# Patient Record
Sex: Female | Born: 1955
Health system: Southern US, Community
[De-identification: ages and names within clinical notes are randomized; demographics above are authoritative.]

## PROBLEM LIST (undated history)

## (undated) DIAGNOSIS — G259 Extrapyramidal and movement disorder, unspecified: Secondary | ICD-10-CM

## (undated) DIAGNOSIS — E119 Type 2 diabetes mellitus without complications: Secondary | ICD-10-CM

## (undated) DIAGNOSIS — F329 Major depressive disorder, single episode, unspecified: Secondary | ICD-10-CM

## (undated) DIAGNOSIS — G629 Polyneuropathy, unspecified: Secondary | ICD-10-CM

## (undated) DIAGNOSIS — F988 Other specified behavioral and emotional disorders with onset usually occurring in childhood and adolescence: Secondary | ICD-10-CM

## (undated) DIAGNOSIS — F32A Depression, unspecified: Secondary | ICD-10-CM

## (undated) DIAGNOSIS — E785 Hyperlipidemia, unspecified: Secondary | ICD-10-CM

## (undated) DIAGNOSIS — G4730 Sleep apnea, unspecified: Secondary | ICD-10-CM

## (undated) DIAGNOSIS — K449 Diaphragmatic hernia without obstruction or gangrene: Secondary | ICD-10-CM

## (undated) DIAGNOSIS — F419 Anxiety disorder, unspecified: Secondary | ICD-10-CM

## (undated) DIAGNOSIS — K222 Esophageal obstruction: Secondary | ICD-10-CM

## (undated) HISTORY — DX: Major depressive disorder, single episode, unspecified: F32.9

## (undated) HISTORY — PX: SPINAL CORD STIMULATOR IMPLANT: SHX2422

## (undated) HISTORY — DX: Anxiety disorder, unspecified: F41.9

## (undated) HISTORY — DX: Extrapyramidal and movement disorder, unspecified: G25.9

## (undated) HISTORY — DX: Depression, unspecified: F32.A

## (undated) HISTORY — PX: CATARACT EXTRACTION, BILATERAL: SHX1313

## (undated) HISTORY — DX: Polyneuropathy, unspecified: G62.9

## (undated) HISTORY — DX: Diaphragmatic hernia without obstruction or gangrene: K44.9

## (undated) HISTORY — PX: CHOLECYSTECTOMY: SHX55

## (undated) HISTORY — DX: Other specified behavioral and emotional disorders with onset usually occurring in childhood and adolescence: F98.8

## (undated) HISTORY — PX: TOE AMPUTATION: SHX809

## (undated) HISTORY — PX: TUBAL LIGATION: SHX77

## (undated) HISTORY — DX: Type 2 diabetes mellitus without complications: E11.9

## (undated) HISTORY — DX: Esophageal obstruction: K22.2

## (undated) HISTORY — PX: ABDOMINAL HYSTERECTOMY: SHX81

## (undated) HISTORY — DX: Sleep apnea, unspecified: G47.30

## (undated) HISTORY — PX: RECTOCELE REPAIR: SHX761

---

## 1999-03-12 ENCOUNTER — Other Ambulatory Visit: Admission: RE | Admit: 1999-03-12 | Discharge: 1999-03-12 | Payer: Self-pay

## 2000-10-12 ENCOUNTER — Ambulatory Visit (HOSPITAL_COMMUNITY): Admission: RE | Admit: 2000-10-12 | Discharge: 2000-10-12 | Payer: Self-pay | Admitting: *Deleted

## 2000-10-12 ENCOUNTER — Encounter (INDEPENDENT_AMBULATORY_CARE_PROVIDER_SITE_OTHER): Payer: Self-pay | Admitting: *Deleted

## 2001-07-24 ENCOUNTER — Other Ambulatory Visit: Admission: RE | Admit: 2001-07-24 | Discharge: 2001-07-24 | Payer: Self-pay | Admitting: Family Medicine

## 2003-07-01 ENCOUNTER — Other Ambulatory Visit: Admission: RE | Admit: 2003-07-01 | Discharge: 2003-07-01 | Payer: Self-pay | Admitting: Family Medicine

## 2004-08-13 ENCOUNTER — Ambulatory Visit: Payer: Self-pay | Admitting: Psychology

## 2004-10-29 ENCOUNTER — Ambulatory Visit: Payer: Self-pay | Admitting: Psychology

## 2005-05-17 ENCOUNTER — Ambulatory Visit: Payer: Self-pay | Admitting: Psychology

## 2005-06-23 ENCOUNTER — Ambulatory Visit: Payer: Self-pay | Admitting: Psychology

## 2006-01-17 ENCOUNTER — Ambulatory Visit: Payer: Self-pay | Admitting: Psychology

## 2006-06-28 ENCOUNTER — Ambulatory Visit (HOSPITAL_COMMUNITY): Payer: Self-pay | Admitting: Psychology

## 2006-07-27 ENCOUNTER — Ambulatory Visit (HOSPITAL_COMMUNITY): Payer: Self-pay | Admitting: Psychology

## 2006-10-09 ENCOUNTER — Ambulatory Visit (HOSPITAL_COMMUNITY): Payer: Self-pay | Admitting: Psychology

## 2007-05-01 ENCOUNTER — Other Ambulatory Visit: Admission: RE | Admit: 2007-05-01 | Discharge: 2007-05-01 | Payer: Self-pay | Admitting: Family Medicine

## 2007-07-02 ENCOUNTER — Ambulatory Visit (HOSPITAL_COMMUNITY): Payer: Self-pay | Admitting: Psychology

## 2007-08-01 ENCOUNTER — Ambulatory Visit (HOSPITAL_COMMUNITY): Payer: Self-pay | Admitting: Psychology

## 2007-08-31 ENCOUNTER — Ambulatory Visit (HOSPITAL_COMMUNITY): Payer: Self-pay | Admitting: Psychology

## 2008-09-05 ENCOUNTER — Ambulatory Visit (HOSPITAL_COMMUNITY): Payer: Self-pay | Admitting: Psychology

## 2008-10-24 ENCOUNTER — Ambulatory Visit (HOSPITAL_COMMUNITY): Payer: Self-pay | Admitting: Psychology

## 2009-04-16 ENCOUNTER — Ambulatory Visit (HOSPITAL_COMMUNITY): Payer: Self-pay | Admitting: Psychology

## 2009-07-08 ENCOUNTER — Ambulatory Visit (HOSPITAL_COMMUNITY): Payer: Self-pay | Admitting: Psychology

## 2009-08-06 ENCOUNTER — Ambulatory Visit (HOSPITAL_COMMUNITY): Payer: Self-pay | Admitting: Psychology

## 2010-12-08 ENCOUNTER — Ambulatory Visit (HOSPITAL_COMMUNITY)
Admission: RE | Admit: 2010-12-08 | Discharge: 2010-12-08 | Payer: Self-pay | Source: Home / Self Care | Attending: Psychology | Admitting: Psychology

## 2011-09-10 ENCOUNTER — Other Ambulatory Visit: Payer: Self-pay | Admitting: Medical

## 2011-10-12 ENCOUNTER — Other Ambulatory Visit: Payer: Self-pay | Admitting: Medical

## 2011-11-04 ENCOUNTER — Other Ambulatory Visit: Payer: Self-pay | Admitting: Medical

## 2011-11-22 ENCOUNTER — Other Ambulatory Visit (HOSPITAL_COMMUNITY): Payer: Self-pay

## 2011-11-29 ENCOUNTER — Encounter (HOSPITAL_BASED_OUTPATIENT_CLINIC_OR_DEPARTMENT_OTHER): Payer: Self-pay

## 2011-12-13 HISTORY — PX: COLONOSCOPY: SHX174

## 2011-12-27 ENCOUNTER — Encounter (HOSPITAL_BASED_OUTPATIENT_CLINIC_OR_DEPARTMENT_OTHER): Payer: Self-pay

## 2012-03-21 ENCOUNTER — Ambulatory Visit: Payer: Self-pay | Admitting: Cardiology

## 2012-07-16 ENCOUNTER — Ambulatory Visit (HOSPITAL_COMMUNITY): Payer: Self-pay | Admitting: Psychology

## 2012-07-18 ENCOUNTER — Ambulatory Visit (INDEPENDENT_AMBULATORY_CARE_PROVIDER_SITE_OTHER): Payer: BC Managed Care – PPO | Admitting: Psychology

## 2012-07-18 DIAGNOSIS — F339 Major depressive disorder, recurrent, unspecified: Secondary | ICD-10-CM

## 2012-07-18 DIAGNOSIS — F411 Generalized anxiety disorder: Secondary | ICD-10-CM

## 2012-07-18 DIAGNOSIS — F419 Anxiety disorder, unspecified: Secondary | ICD-10-CM

## 2012-08-01 ENCOUNTER — Ambulatory Visit (HOSPITAL_COMMUNITY): Payer: Self-pay | Admitting: Psychology

## 2012-08-02 ENCOUNTER — Encounter (HOSPITAL_COMMUNITY): Payer: Self-pay | Admitting: Psychology

## 2012-08-02 NOTE — Progress Notes (Signed)
[name]   DOB: 09/06/1956  MR Number: [id number]  Location: BEHAVIORAL [facility] [address] [place] [address] Dept: [phone]  Start: 2 PM End: 3 PM  Provider/Observer:     Hershal Coria PSYD  Chief Complaint:      Chief Complaint  Patient presents with  . Depression  . Anxiety  . Stress  . Trauma    Reason For Service:     I seen the patient is also known to 4 several years. She was initially referred here for difficulty coping with the situation involving her children. Her youngest son is at severe difficulties over the years with regard to mood disorder, substance abuse and behavioral problems. Now there are major stressors associated with him again the patient has been essentially overwhelmed and unsure about what to do about. She reports that this has created a lot of stress both at home and at work. On top of that, her diabetes has gotten to the point that she had hammertoe amputated because of that. She has neuropathy as a result of diabetes as well and has had to stay out of her foot for 8 weeks.  Interventions Strategy:  Cognitive/behavioral psychotherapeutic interventions  Participation Level:   Active  Participation Quality:  Appropriate      Behavioral Observation:  Well Groomed, Alert, and Depressed.   Current Psychosocial Factors: The patient is essentially having to deal with her own medical issues from diabetes with resulting amputation of her toe and to return back to school after summer break. 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 The patient has had major problems with her son who has a long history of behavioral and substance abuse and bipolar disorder. He has started a relationship with a woman who has children and essentially is taking advantage of him but he is unable to deal  with her personality disorders and the lack of care that she gets to her children said he is taking care of the children.  Content of Session:   Review current symptoms and continued work on building improve coping skills  Current Status:   The patient is doing fairly poorly right now and is essentially overwhelmed bile the situation symptoms.  Patient Progress:   Stable  Target Goals:   Target goals includes reducing episodes of significant depression and anxiety doing better with her attentional problems, and helping the patient better manage her situation with her son.  Last Reviewed:   07/18/2002  Goals Addressed Today:    Goals addressed included building better coping skills.  Impression/Diagnosis:   The patient has a long history of attention deficit disorder but do to Maj. psychosocial stressors she also developed clinical depression and anxiety. I think that her attentional problems were valid prior to the development of these issues that existed her entire life. However, she is in and repeat if and recurrent trauma with regard primarily to her son and to her daughter to a lesser degree in years past.  Diagnosis:    Axis I:  1. Major depression, recurrent   2. Anxiety         Axis II: No diagnosis

## 2012-08-03 ENCOUNTER — Ambulatory Visit (INDEPENDENT_AMBULATORY_CARE_PROVIDER_SITE_OTHER): Payer: BC Managed Care – PPO | Admitting: Psychology

## 2012-08-03 ENCOUNTER — Encounter (HOSPITAL_COMMUNITY): Payer: Self-pay | Admitting: Psychology

## 2012-08-03 DIAGNOSIS — F339 Major depressive disorder, recurrent, unspecified: Secondary | ICD-10-CM

## 2012-08-03 DIAGNOSIS — F419 Anxiety disorder, unspecified: Secondary | ICD-10-CM

## 2012-08-03 DIAGNOSIS — F411 Generalized anxiety disorder: Secondary | ICD-10-CM

## 2012-08-03 NOTE — Progress Notes (Signed)
[name]   DOB: Feb 09, 1956  MR Number: [id number]  Location: BEHAVIORAL [facility] [address] [place] [address] Dept: [phone]  Start: 11 AM End: 12 PM  Provider/Observer:     Hershal Coria PSYD  Chief Complaint:      Chief Complaint  Patient presents with  . Agitation  . Stress  . Anxiety  . Depression    Reason For Service:     I seen the patient is also known to 4 several years. She was initially referred here for difficulty coping with the situation involving her children. Her youngest son is at severe difficulties over the years with regard to mood disorder, substance abuse and behavioral problems. Now there are major stressors associated with him again the patient has been essentially overwhelmed and unsure about what to do about. She reports that this has created a lot of stress both at home and at work. On top of that, her diabetes has gotten to the point that she had hammertoe amputated because of that. She has neuropathy as a result of diabetes as well and has had to stay out of her foot for 8 weeks.  Interventions Strategy:  Cognitive/behavioral psychotherapeutic interventions  Participation Level:   Active  Participation Quality:  Appropriate      Behavioral Observation:  Well Groomed, Alert, and Depressed.   Current Psychosocial Factors: The patient reports there continues to be a lot of stress regarding her son who is struggling with his relationship. Child protective services has gone to reviewed there living situation multiple times after the patient called and reported some of the problems are patent. She recalled a couple of instances that were very scary for her regarding her son. The patient's daughter is actually doing much better even though her husband is going away every weekend.  Content of Session:   Review current symptoms and continued work on building  improve coping skills  Current Status:   The patient is doing fairly poorly right now and is essentially overwhelmed bile the situation symptoms.  Patient Progress:   Stable  Target Goals:   Target goals includes reducing episodes of significant depression and anxiety doing better with her attentional problems, and helping the patient better manage her situation with her son.  Last Reviewed:   08/03/2002  Goals Addressed Today:    Goals addressed included building better coping skills.  Impression/Diagnosis:   The patient has a long history of attention deficit disorder but do to Maj. psychosocial stressors she also developed clinical depression and anxiety. I think that her attentional problems were valid prior to the development of these issues that existed her entire life. However, she is in and repeat if and recurrent trauma with regard primarily to her son and to her daughter to a lesser degree in years past.  Diagnosis:    Axis I:  1. Major depression, recurrent   2. Anxiety         Axis II: No diagnosis

## 2012-08-20 ENCOUNTER — Ambulatory Visit (INDEPENDENT_AMBULATORY_CARE_PROVIDER_SITE_OTHER): Payer: BC Managed Care – PPO | Admitting: Psychology

## 2012-08-20 DIAGNOSIS — F339 Major depressive disorder, recurrent, unspecified: Secondary | ICD-10-CM

## 2012-08-20 DIAGNOSIS — F411 Generalized anxiety disorder: Secondary | ICD-10-CM

## 2012-08-20 DIAGNOSIS — F419 Anxiety disorder, unspecified: Secondary | ICD-10-CM

## 2012-08-21 ENCOUNTER — Encounter (HOSPITAL_COMMUNITY): Payer: Self-pay | Admitting: Psychology
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
## 2012-11-19 ENCOUNTER — Ambulatory Visit (INDEPENDENT_AMBULATORY_CARE_PROVIDER_SITE_OTHER): Payer: BC Managed Care – PPO | Admitting: Psychology

## 2012-11-19 DIAGNOSIS — F411 Generalized anxiety disorder: Secondary | ICD-10-CM

## 2012-11-19 DIAGNOSIS — F419 Anxiety disorder, unspecified: Secondary | ICD-10-CM

## 2012-11-19 DIAGNOSIS — F339 Major depressive disorder, recurrent, unspecified: Secondary | ICD-10-CM

## 2012-11-21 ENCOUNTER — Encounter (HOSPITAL_COMMUNITY): Payer: Self-pay | Admitting: Psychology

## 2012-11-21 ENCOUNTER — Telehealth (HOSPITAL_COMMUNITY): Payer: Self-pay | Admitting: Psychology
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
## 2012-11-29 ENCOUNTER — Ambulatory Visit (INDEPENDENT_AMBULATORY_CARE_PROVIDER_SITE_OTHER): Payer: BC Managed Care – PPO | Admitting: Psychology

## 2012-11-29 DIAGNOSIS — F339 Major depressive disorder, recurrent, unspecified: Secondary | ICD-10-CM

## 2012-11-29 DIAGNOSIS — F411 Generalized anxiety disorder: Secondary | ICD-10-CM

## 2012-11-29 DIAGNOSIS — F419 Anxiety disorder, unspecified: Secondary | ICD-10-CM

## 2012-12-03 ENCOUNTER — Encounter (HOSPITAL_COMMUNITY): Payer: Self-pay | Admitting: Psychology
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
## 2012-12-25 ENCOUNTER — Ambulatory Visit (INDEPENDENT_AMBULATORY_CARE_PROVIDER_SITE_OTHER): Payer: BC Managed Care – PPO | Admitting: Psychology

## 2012-12-25 DIAGNOSIS — F419 Anxiety disorder, unspecified: Secondary | ICD-10-CM

## 2012-12-25 DIAGNOSIS — F411 Generalized anxiety disorder: Secondary | ICD-10-CM

## 2012-12-25 DIAGNOSIS — F339 Major depressive disorder, recurrent, unspecified: Secondary | ICD-10-CM

## 2012-12-27 ENCOUNTER — Encounter (HOSPITAL_COMMUNITY): Payer: Self-pay | Admitting: Psychology
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
## 2013-04-12 ENCOUNTER — Other Ambulatory Visit: Payer: Self-pay

## 2013-04-12 MED ORDER — SITAGLIPTIN PHOS-METFORMIN HCL 50-1000 MG PO TABS: 1.0000 | ORAL_TABLET | Freq: Two times a day (BID) | ORAL | Status: DC

## 2013-04-12 NOTE — Telephone Encounter (Signed)
Last blood sugar 12/13

## 2013-04-15 ENCOUNTER — Telehealth: Payer: Self-pay | Admitting: Nurse Practitioner

## 2013-04-15 MED ORDER — SITAGLIP PHOS-METFORMIN HCL ER 50-1000 MG PO TB24: 1.0000 | ORAL_TABLET | Freq: Every day | ORAL | Status: DC

## 2013-04-15 NOTE — Telephone Encounter (Signed)
Sent in RX for janumet XR

## 2013-04-15 NOTE — Telephone Encounter (Signed)
xr was the right one patient aware

## 2013-04-15 NOTE — Telephone Encounter (Signed)
Paper chart has a refill  Done by [facility] with XR noted . Please review and advise.

## 2013-05-07 ENCOUNTER — Ambulatory Visit (INDEPENDENT_AMBULATORY_CARE_PROVIDER_SITE_OTHER): Payer: BC Managed Care – PPO | Admitting: Psychology

## 2013-05-07 DIAGNOSIS — F419 Anxiety disorder, unspecified: Secondary | ICD-10-CM

## 2013-05-07 DIAGNOSIS — F411 Generalized anxiety disorder: Secondary | ICD-10-CM

## 2013-05-07 DIAGNOSIS — F339 Major depressive disorder, recurrent, unspecified: Secondary | ICD-10-CM

## 2013-05-14 ENCOUNTER — Telehealth: Payer: Self-pay | Admitting: Nurse Practitioner

## 2013-05-15 MED ORDER — SITAGLIP PHOS-METFORMIN HCL ER 50-1000 MG PO TB24: 1.0000 | ORAL_TABLET | Freq: Every day | ORAL | Status: DC

## 2013-05-15 NOTE — Telephone Encounter (Signed)
Done 05/15/13

## 2013-05-16 ENCOUNTER — Other Ambulatory Visit: Payer: Self-pay | Admitting: Family Medicine

## 2013-05-16 DIAGNOSIS — E119 Type 2 diabetes mellitus without complications: Secondary | ICD-10-CM

## 2013-05-16 MED ORDER — SITAGLIP PHOS-METFORMIN HCL ER 50-1000 MG PO TB24: 1.0000 | ORAL_TABLET | Freq: Two times a day (BID) | ORAL | Status: DC

## 2013-05-23 ENCOUNTER — Encounter (HOSPITAL_COMMUNITY): Payer: Self-pay | Admitting: Psychology

## 2013-05-23 NOTE — Progress Notes (Signed)
[name]   DOB: Nov 11, 1956  MR Number: [id number]  Location: BEHAVIORAL [facility]- [address] [place] [address] Dept: [phone]  Start: 4 PM End: 5 PM  Provider/Observer:     Hershal Coria PSYD  Chief Complaint:      Chief Complaint  Patient presents with  . Anxiety  . Stress  . Depression    Reason For Service:     I seen the patient is also known to 4 several years. She was initially referred here for difficulty coping with the situation involving her children. Her youngest son is at severe difficulties over the years with regard to mood disorder, substance abuse and behavioral problems. Now there are major stressors associated with him again the patient has been essentially overwhelmed and unsure about what to do about. She reports that this has created a lot of stress both at home and at work. On top of that, her diabetes has gotten to the point that she had hammertoe amputated because of that. She has neuropathy as a result of diabetes as well and has had to stay out of her foot for 8 weeks.  Interventions Strategy:  Cognitive/behavioral psychotherapeutic interventions  Participation Level:   Active  Participation Quality:  Appropriate      Behavioral Observation:  Well Groomed, Alert, and Depressed.   Current Psychosocial Factors: The patient is continuing to struggle mildly with her youngest son. He is in a very destructive and disruptive relationship although he has agreed for help. 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  Patient Progress:   Stable  Target  Goals:   Target goals includes reducing episodes of significant depression and anxiety doing better with her attentional problems, and helping the patient better manage her situation with her son.  Last Reviewed:   05/07/2013  Goals Addressed Today:    Goals addressed included building better coping skills.  Impression/Diagnosis:   The patient has a long history of attention deficit disorder but do to Maj. psychosocial stressors she also developed clinical depression and anxiety. I think that her attentional problems were valid prior to the development of these issues that existed her entire life. However, she is in and repeat if and recurrent trauma with regard primarily to her son and to her daughter to a lesser degree in years past.  Diagnosis:    Axis I:  Major depression, recurrent  Anxiety      Axis II: No diagnosis

## 2013-05-30 ENCOUNTER — Other Ambulatory Visit: Payer: Self-pay | Admitting: Family Medicine

## 2013-05-30 NOTE — Telephone Encounter (Signed)
Last seen 01/03/13  [facility]  If approved call in and have nurse notify patient

## 2013-05-31 MED ORDER — PREGABALIN 150 MG PO CAPS: 150.0000 mg | ORAL_CAPSULE | Freq: Two times a day (BID) | ORAL | Status: DC

## 2013-05-31 NOTE — Telephone Encounter (Signed)
Please call in lyrica rx with 2 refills

## 2013-05-31 NOTE — Telephone Encounter (Signed)
Done 05/30/13

## 2013-05-31 NOTE — Telephone Encounter (Signed)
Med called in

## 2013-06-17 ENCOUNTER — Other Ambulatory Visit: Payer: Self-pay

## 2013-06-17 MED ORDER — INSULIN GLARGINE 100 UNIT/ML SOLOSTAR PEN: 10.0000 [IU] | PEN_INJECTOR | Freq: Every day | SUBCUTANEOUS | Status: DC

## 2013-06-17 NOTE — Telephone Encounter (Signed)
This patient needs an appointment to be seen by a provider. The medication can be refill one time more time. She needs to be seen  by a provider. Please schedule her to be seen.

## 2013-06-17 NOTE — Telephone Encounter (Signed)
Last glucose 11/20/12  [facility]

## 2013-06-20 ENCOUNTER — Ambulatory Visit (INDEPENDENT_AMBULATORY_CARE_PROVIDER_SITE_OTHER): Payer: BC Managed Care – PPO | Admitting: Nurse Practitioner

## 2013-06-20 ENCOUNTER — Encounter: Payer: Self-pay | Admitting: Nurse Practitioner

## 2013-06-20 VITALS — BP 128/75 | HR 84 | Ht 67.0 in | Wt 184.0 lb

## 2013-06-20 DIAGNOSIS — E113293 Type 2 diabetes mellitus with mild nonproliferative diabetic retinopathy without macular edema, bilateral: Secondary | ICD-10-CM | POA: Insufficient documentation

## 2013-06-20 DIAGNOSIS — E1140 Type 2 diabetes mellitus with diabetic neuropathy, unspecified: Secondary | ICD-10-CM | POA: Insufficient documentation

## 2013-06-20 DIAGNOSIS — E119 Type 2 diabetes mellitus without complications: Secondary | ICD-10-CM

## 2013-06-20 DIAGNOSIS — G2581 Restless legs syndrome: Secondary | ICD-10-CM

## 2013-06-20 DIAGNOSIS — E1169 Type 2 diabetes mellitus with other specified complication: Secondary | ICD-10-CM | POA: Insufficient documentation

## 2013-06-20 DIAGNOSIS — G609 Hereditary and idiopathic neuropathy, unspecified: Secondary | ICD-10-CM

## 2013-06-20 MED ORDER — CARBIDOPA-LEVODOPA 25-100 MG PO TABS: 1.0000 | ORAL_TABLET | Freq: Three times a day (TID) | ORAL | Status: DC

## 2013-06-20 MED ORDER — PREGABALIN 150 MG PO CAPS: 150.0000 mg | ORAL_CAPSULE | Freq: Three times a day (TID) | ORAL | Status: DC

## 2013-06-20 NOTE — Progress Notes (Signed)
HPI:  Rhonda Thomas, 57 year old  white female returns for followup. She has a history of bilateral  feet paresthesia and restless leg syndrome.  She also has history of insulin dependent DM for more than 13 years now,  around the time of diagnosis, she noticed bilateral plantar feet and toes numbness, tingling, slow progression over the years, now spreading to whole feet.  She complains  of burning, tingling, some time knife stabbing pain from bottom of her feet upwards, increased after bearing weight. Her DM was under suboptimal control, A1C 7.8, now on insulin, she is taking lyrica 150mg  bid, only mild improvement, high copay. She denies bialteral lower extremity weakness, bilateral hands paresthesia, no incontinence.  EEG/Miranda  abnormal study. There is electrodiagnostic evidence of mild to moderate length-dependent axonal sensorimotor polyneuropathy. There is also evidence of mild to moderate bilateral carpal tunnel syndromes. There is no evidence of right lumbar sacral radiculopathy. She had right second toe amputation due to infection, she still has bilateral third toe swelling from recent correction surgery, continues to complain of bilateral feet paresthesias, she has restless leg symptoms, difficulty holding his feet and leg still when trying to sleep, she has tried Requip, causing worsening symptoms,  06/20/13 Pt returns for followup: Continues with feet parathesias,  tried the cream ordered for neuropathy  but had itching. Never started the sinemet. Currently taking Lyrica 150 mg TID.  She describes the pain as a tingling shooting pain like electric shocks in the feet always worse about 30 minutes after sitting down it can interfere with her sleep at night. Occasionally has pain in the fingers . Had mild to moderate carpal tunnel syndrome bilaterally on EMG nerve conduction.  Blood sugars vary. Labs to be rechecked soon. She is aware that her symptoms are worse when blood sugars are higher. After  sitting or lying down for long periods her legs get stiff. She currently has a diabetic ulcer on her left great toe. It appears to be healing slowly.     ROS:  Urinary frequency, insomnia, depression anxiety, restless legs  Physical Exam General: well developed, well nourished, seated, in no evident distress Head: head normocephalic and atraumatic. Oropharynx benign Neck: supple with no carotid  bruits Cardiovascular: regular rate and rhythm, no murmurs Skin: No peripheral edema, right second toe amputation  Neurologic Exam Mental Status: Awake and fully alert. Oriented to place and time. Follows all commands. Speech and language normal.   Cranial Nerves: Fundoscopic exam reveals sharp disc margins. Pupils equal, briskly reactive to light. Extraocular movements full without nystagmus. Visual fields full to confrontation. Hearing intact and symmetric to finger snap. Facial sensation intact. Face, tongue, palate move normally and symmetrically. Neck flexion and extension normal.  Motor: Normal bulk and tone. Normal strength in all tested extremity muscles.No focal weakness Sensory.: Length-dependent decreased light touch, pinprick to mid shin, decreased proprioception, and decreased vibratory sensation to ankle level .  Coordination: Rapid alternating movements normal in all extremities. Finger-to-nose and heel-to-shin performed accurately bilaterally. Gait and Station: Arises from chair without difficulty. Stance is narrow based and steady . Able to heel, toe and tandem walk without difficulty.  Reflexes: 2+ and symmetric except absent Achilles. Toes downgoing.     ASSESSMENT: Past medical history of diabetes, length-dependent sensory changes consistent with a diabetic peripheral neuropathy. She also has signs of restless leg and has failed Requip in the past which made this worse.     PLAN: Continue Lyrica at current dose 150mg  TID, will refill Sinemet  25/100 twice daily for a week then  increase to three times daily. Works best 1 hrs before meals or 2 hours after meals.  Obtain ferritin , Iron and TIBC with blood draw Followup in 6 months  Rhonda Thomas, GNP-BC APRN

## 2013-06-20 NOTE — Patient Instructions (Addendum)
Continue Lyrica at current dose 150mg  TID, will refill Sinemet 25/100 twice daily for a week then increase to three times daily. Works best 1 hrs before meals or 2 hours after meals.  Obtain ferritin , Iron and TIBC with blood draw Followup in 6 months

## 2013-06-21 ENCOUNTER — Encounter: Payer: Self-pay | Admitting: Family Medicine

## 2013-06-21 ENCOUNTER — Ambulatory Visit (INDEPENDENT_AMBULATORY_CARE_PROVIDER_SITE_OTHER): Payer: BC Managed Care – PPO | Admitting: Family Medicine

## 2013-06-21 VITALS — BP 130/87 | HR 84 | Temp 97.0°F | Wt 188.6 lb

## 2013-06-21 DIAGNOSIS — F988 Other specified behavioral and emotional disorders with onset usually occurring in childhood and adolescence: Secondary | ICD-10-CM | POA: Insufficient documentation

## 2013-06-21 DIAGNOSIS — G4700 Insomnia, unspecified: Secondary | ICD-10-CM | POA: Insufficient documentation

## 2013-06-21 DIAGNOSIS — E785 Hyperlipidemia, unspecified: Secondary | ICD-10-CM | POA: Insufficient documentation

## 2013-06-21 DIAGNOSIS — F411 Generalized anxiety disorder: Secondary | ICD-10-CM | POA: Insufficient documentation

## 2013-06-21 DIAGNOSIS — G2581 Restless legs syndrome: Secondary | ICD-10-CM | POA: Insufficient documentation

## 2013-06-21 DIAGNOSIS — G609 Hereditary and idiopathic neuropathy, unspecified: Secondary | ICD-10-CM

## 2013-06-21 DIAGNOSIS — E119 Type 2 diabetes mellitus without complications: Secondary | ICD-10-CM | POA: Insufficient documentation

## 2013-06-21 LAB — COMPLETE METABOLIC PANEL WITH GFR
ALT: 18 U/L (ref 0–35)
AST: 12 U/L (ref 0–37)
Albumin: 4.1 g/dL (ref 3.5–5.2)
Alkaline Phosphatase: 55 U/L (ref 39–117)
BUN: 13 mg/dL (ref 6–23)
CO2: 27 mEq/L (ref 19–32)
Calcium: 9.3 mg/dL (ref 8.4–10.5)
Chloride: 100 mEq/L (ref 96–112)
Creat: 0.69 mg/dL (ref 0.50–1.10)
GFR, Est African American: >89 mL/min
GFR, Est Non African American: >89 mL/min
Glucose, Bld: 220 mg/dL — ABNORMAL HIGH (ref 70–99)
Potassium: 4.8 mEq/L (ref 3.5–5.3)
Sodium: 137 mEq/L (ref 135–145)
Total Bilirubin: 0.5 mg/dL (ref 0.3–1.2)
Total Protein: 6.5 g/dL (ref 6.0–8.3)

## 2013-06-21 LAB — POCT UA - MICROALBUMIN: Microalbumin Ur, POC: POSITIVE mg/L

## 2013-06-21 LAB — FERRITIN: Ferritin: 22 ng/mL (ref 10–291)

## 2013-06-21 LAB — POCT GLYCOSYLATED HEMOGLOBIN (HGB A1C): Hemoglobin A1C: 7

## 2013-06-21 LAB — IRON AND TIBC
%SAT: 22 % (ref 20–55)
Iron: 79 ug/dL (ref 42–145)
TIBC: 367 ug/dL (ref 250–470)
UIBC: 288 ug/dL (ref 125–400)

## 2013-06-21 MED ORDER — SITAGLIP PHOS-METFORMIN HCL ER 50-1000 MG PO TB24: 1.0000 | ORAL_TABLET | Freq: Two times a day (BID) | ORAL | Status: DC

## 2013-06-21 MED ORDER — ZOLPIDEM TARTRATE 10 MG PO TABS: 10.0000 mg | ORAL_TABLET | Freq: Every evening | ORAL | Status: DC | PRN

## 2013-06-21 MED ORDER — AMPHETAMINE-DEXTROAMPHET ER 20 MG PO CP24: 20.0000 mg | ORAL_CAPSULE | ORAL | Status: DC

## 2013-06-21 MED ORDER — GLIMEPIRIDE 4 MG PO TABS: 4.0000 mg | ORAL_TABLET | Freq: Two times a day (BID) | ORAL | Status: DC

## 2013-06-21 MED ORDER — INSULIN GLARGINE 100 UNIT/ML SOLOSTAR PEN: 10.0000 [IU] | PEN_INJECTOR | Freq: Every day | SUBCUTANEOUS | Status: DC

## 2013-06-21 NOTE — Progress Notes (Signed)
Patient ID: Rhonda Thomas, female   DOB: 14-Jun-1956, 57 y.o.   MRN: [id number] SUBJECTIVE: CC: Chief Complaint  Patient presents with  . Medication Refill    needs refill wants labs saw Beverely Low at [facility] neurolog[facility] and she wants labs  ferritin, TIBC ,and iron level  . Medication Management    has old rx of adderrall woiuld like rx    HPI: Patient is here for follow up of Diabetes Mellitus: Symptoms of DM: Denies Nocturia ,Denies Urinary Frequency , denies Blurred vision ,deniesDizziness,denies.Dysuria,denies paresthesias, denies extremity pain or ulcers.Marland Kitchendenies chest pain. has had an annual eye exam. do check the feet. Does check CBGs. Average CBG:150 Denies episodes of hypoglycemia. Does have an emergency hypoglycemic plan. admits toCompliance with medications. Denies Problems with medications.  Breakfast: not very good Lunch: salad: garden salad with dressing , with a burger sometimes, sometimes  Sandwich Supper: starts with a  Salad, sometimes goes out to eat, sometimes a hamburger steak  Has a h/o AD/HD all her life. Paulita Cradle made the diagnosis. Her 2 sons were diagnosed with AD/HD. Always slow in reading due to lack of focus.  Has restless syndrome and  Needed extra labs for the neurologist.  Has neuropathy as well. Works  As a Engineer, site   Past Medical History  Diagnosis Date  . Diabetes   . Anxiety   . Movement disorder   . Neuropathy    Past Surgical History  Procedure Laterality Date  . Cholecystectomy    . Tubal ligation    . Abdominal hysterectomy    . Toe amputation Right     2nd toe rt foot   History   Social History  . Marital Status: Married    Spouse Name: N/A    Number of Children: N/A  . Years of Education: N/A   Occupational History  . Not on file.   Social History Main Topics  . Smoking status: Never Smoker   . Smokeless tobacco: Never Used  . Alcohol Use: No     Comment: tea once daily.   . Drug Use: No  . Sexually  Active: Not on file   Other Topics Concern  . Not on file   Social History Narrative   Patient lives at home with her husband.   Patient works for [employer].          Family History  Problem Relation Age of Onset  . Diabetes    . Heart Problems     Current Outpatient Prescriptions on File Prior to Visit  Medication Sig Dispense Refill  . carbidopa-levodopa (SINEMET IR) 25-100 MG per tablet Take 1 tablet by mouth 3 (three) times daily.  90 tablet  6  . glimepiride (AMARYL) 4 MG tablet Take 4 mg by mouth 2 (two) times daily before a meal.       . HYDROcodone-acetaminophen (NORCO) 10-325 MG per tablet Take 1 tablet by mouth daily.       . Insulin Glargine (LANTUS SOLOSTAR) 100 UNIT/ML SOPN Inject 10 Units into the skin at bedtime.  5 pen  PRN  . pregabalin (LYRICA) 150 MG capsule Take 1 capsule (150 mg total) by mouth 3 (three) times daily.  90 capsule  6  . SitaGLIPtin-MetFORMIN HCl (JANUMET XR) 50-1000 MG TB24 Take 1 tablet by mouth 2 (two) times daily.  60 tablet  1  . zolpidem (AMBIEN) 10 MG tablet Take 10 mg by mouth at bedtime as needed.  No current facility-administered medications on file prior to visit.   No Known Allergies  There is no immunization history on file for this patient. Prior to Admission medications   Medication Sig Start Date End Date Taking? Authorizing Provider  carbidopa-levodopa (SINEMET IR) 25-100 MG per tablet Take 1 tablet by mouth 3 (three) times daily. 06/20/13  Yes Nilda Riggs, NP  glimepiride (AMARYL) 4 MG tablet Take 4 mg by mouth 2 (two) times daily before a meal.  06/16/13  Yes Historical Provider, MD  HYDROcodone-acetaminophen (NORCO) 10-325 MG per tablet Take 1 tablet by mouth daily.  06/17/13  Yes Historical Provider, MD  Insulin Glargine (LANTUS SOLOSTAR) 100 UNIT/ML SOPN Inject 10 Units into the skin at bedtime. 06/17/13  Yes Ernestina Penna, MD  pregabalin (LYRICA) 150 MG capsule Take 1 capsule (150 mg total) by  mouth 3 (three) times daily. 06/20/13  Yes Nilda Riggs, NP  SitaGLIPtin-MetFORMIN HCl (JANUMET XR) 50-1000 MG TB24 Take 1 tablet by mouth 2 (two) times daily. 05/16/13  Yes Ileana Ladd, MD  zolpidem (AMBIEN) 10 MG tablet Take 10 mg by mouth at bedtime as needed.  05/15/13   Historical Provider, MD    ROS: As above in the HPI. All other systems are stable or negative.  OBJECTIVE: APPEARANCE:  Patient in no acute distress.The patient appeared well nourished and normally developed. Acyanotic. Waist: VITAL SIGNS:BP 130/87  Pulse 84  Temp(Src) 97 F (36.1 C) (Oral)  Wt 188 lb 9.6 oz (85.548 kg)  BMI 29.53 kg/m2 WF overweight  SKIN: warm and  Dry without overt rashes, tattoos and scars  HEAD and Neck: without JVD, Head and scalp: normal Eyes:No scleral icterus. Fundi normal, eye movements normal. Ears: Auricle normal, canal normal, Tympanic membranes normal, insufflation normal. Nose: normal Throat: normal Neck & thyroid: normal  CHEST & LUNGS: Chest wall: normal Lungs: Clear  CVS: Reveals the PMI to be normally located. Regular rhythm, First and Second Heart sounds are normal,  absence of murmurs, rubs or gallops. Peripheral vasculature: Radial pulses: normal Dorsal pedis pulses: normal Posterior pulses: normal  ABDOMEN:  Appearance: normal Benign, no organomegaly, no masses, no Abdominal Aortic enlargement. No Guarding , no rebound. No Bruits. Bowel sounds: normal  RECTAL: N/A GU: N/A  EXTREMETIES: nonedematous. Both Femoral and Pedal pulses are normal.  MUSCULOSKELETAL:  Spine: normal Joints: intact  NEUROLOGIC: oriented to time,place and person; nonfocal. Strength is normal Sensory is normal Reflexes are normal Cranial Nerves are normal.  ASSESSMENT: DM (diabetes mellitus) - Plan: SitaGLIPtin-MetFORMIN HCl (JANUMET XR) 50-1000 MG TB24, Insulin Glargine (LANTUS SOLOSTAR) 100 UNIT/ML SOPN, glimepiride (AMARYL) 4 MG tablet, POCT glycosylated  hemoglobin (Hb A1C), POCT UA - Microalbumin, COMPLETE METABOLIC PANEL WITH GFR, Microalbumin, urine  Generalized anxiety disorder  HLD (hyperlipidemia) - Plan: NMR Lipoprofile with Lipids  Restless legs - Plan: Ferritin, Iron and TIBC  Unspecified hereditary and idiopathic peripheral neuropathy  Attention deficit disorder without mention of hyperactivity - Plan: amphetamine-dextroamphetamine (ADDERALL XR) 20 MG 24 hr capsule  Insomnia - Plan: zolpidem (AMBIEN) 10 MG tablet    PLAN:      Dr Woodroe Mode Recommendations  Diet and Exercise discussed with patient.  For nutrition information, I recommend books:  1).Eat to Live by Dr Monico Hoar. 2).Prevent and Reverse Heart Disease by Dr Suzzette Righter. 3) Dr Katherina Right Book:  Program to Reverse Diabetes  Exercise recommendations are:  If unable to walk, then the patient can exercise in a chair 3 times a day.  By flapping arms like a bird gently and raising legs outwards to the front.  If ambulatory, the patient can go for walks for 30 minutes 3 times a week. Then increase the intensity and duration as tolerated.  Goal is to try to attain exercise frequency to 5 times a week.  If applicable: Best to perform resistance exercises (machines or weights) 2 days a week and cardio type exercises 3 days per week.   30 minutes  Education on DM, ADD , her present Lifestyle and  Need for changes to make  Progress, and need to limit use of stimulants and insomnia meds.  Orders Placed This Encounter  Procedures  . Ferritin  . Iron and TIBC  . COMPLETE METABOLIC PANEL WITH GFR  . NMR Lipoprofile with Lipids  . Microalbumin, urine  . POCT glycosylated hemoglobin (Hb A1C)  . POCT UA - Microalbumin   Meds ordered this encounter  Medications  . SitaGLIPtin-MetFORMIN HCl (JANUMET XR) 50-1000 MG TB24    Sig: Take 1 tablet by mouth 2 (two) times daily.    Dispense:  60 tablet    Refill:  11    Order Specific Question:   Supervising Provider    Answer:  Ernestina Penna [[id number]]  . Insulin Glargine (LANTUS SOLOSTAR) 100 UNIT/ML SOPN    Sig: Inject 10 Units into the skin at bedtime.    Dispense:  5 pen    Refill:  PRN  . glimepiride (AMARYL) 4 MG tablet    Sig: Take 1 tablet (4 mg total) by mouth 2 (two) times daily before a meal.    Dispense:  30 tablet    Refill:  11  . amphetamine-dextroamphetamine (ADDERALL XR) 20 MG 24 hr capsule    Sig: Take 1 capsule (20 mg total) by mouth every morning.    Dispense:  30 capsule    Refill:  0  . zolpidem (AMBIEN) 10 MG tablet    Sig: Take 1 tablet (10 mg total) by mouth at bedtime as needed.    Dispense:  30 tablet    Refill:  0   Encouraged LTC. Also handout on titles on nutrition related books for her medical problems from [facility]'s Dr Lequita Asal.  Handout in the AVS.  Return in about 3 months (around 09/21/2013) for Recheck medical problems.  Ziair Penson P. Modesto Charon, M.D.

## 2013-06-21 NOTE — Patient Instructions (Addendum)
    Dr Strider Vallance's Recommendations  Diet and Exercise discussed with patient.  For nutrition information, I recommend books:  1).Eat to Live by Dr Joel Fuhrman. 2).Prevent and Reverse Heart Disease by Dr Caldwell Esselstyn. 3) Dr Neal Barnard's Book:  Program to Reverse Diabetes  Exercise recommendations are:  If unable to walk, then the patient can exercise in a chair 3 times a day. By flapping arms like a bird gently and raising legs outwards to the front.  If ambulatory, the patient can go for walks for 30 minutes 3 times a week. Then increase the intensity and duration as tolerated.  Goal is to try to attain exercise frequency to 5 times a week.  If applicable: Best to perform resistance exercises (machines or weights) 2 days a week and cardio type exercises 3 days per week.  Diabetes and Foot Care Diabetes may cause you to have a poor blood supply (circulation) to your legs and feet. Because of this, the skin may be thinner, break easier, and heal more slowly. You also may have nerve damage in your legs and feet causing decreased feeling. You may not notice minor injuries to your feet that could lead to serious problems or infections. Taking care of your feet is one of the most important things you can do for yourself.  HOME CARE INSTRUCTIONS  Do not go barefoot. Bare feet are easily injured.  Check your feet daily for blisters, cuts, and redness.  Wash your feet with warm water (not hot) and mild soap. Pat your feet and between your toes until completely dry.  Apply a moisturizing lotion that does not contain alcohol or petroleum jelly to the dry skin on your feet and to dry brittle toenails. Do not put it between your toes.  Trim your toenails straight across. Do not dig under them or around the cuticle.  Do not cut corns or calluses, or try to remove them with medicine.  Wear clean cotton socks or stockings every day. Make sure they are not too tight. Do not wear knee  high stockings since they may decrease blood flow to your legs.  Wear leather shoes that fit properly and have enough cushioning. To break in new shoes, wear them just a few hours a day to avoid injuring your feet.  Wear shoes at all times, even in the house.  Do not cross your legs. This may decrease the blood flow to your feet.  If you find a minor scrape, cut, or break in the skin on your feet, keep it and the skin around it clean and dry. These areas may be cleansed with mild soap and water. Do not use peroxide, alcohol, iodine or Merthiolate.  When you remove an adhesive bandage, be sure not to harm the skin around it.  If you have a wound, look at it several times a day to make sure it is healing.  Do not use heating pads or hot water bottles. Burns can occur. If you have lost feeling in your feet or legs, you may not know it is happening until it is too late.  Report any cuts, sores or bruises to your caregiver. Do not wait! SEEK MEDICAL CARE IF:   You have an injury that is not healing or you notice redness, numbness, burning, or tingling.  Your feet always feel cold.  You have pain or cramps in your legs and feet. SEEK IMMEDIATE MEDICAL CARE IF:   There is increasing redness, swelling, or   increasing pain in the wound.  There is a red line that goes up your leg.  Pus is coming from a wound.  You develop an unexplained oral temperature above 102 F (38.9 C), or as your caregiver suggests.  You notice a bad smell coming from an ulcer or wound. MAKE SURE YOU:   Understand these instructions.  Will watch your condition.  Will get help right away if you are not doing well or get worse. Document Released: 11/25/2000 Document Revised: 02/20/2012 Document Reviewed: 06/03/2009 Exit[employer] Patient Information 2014 [employer].  

## 2013-06-22 LAB — MICROALBUMIN, URINE: Microalb, Ur: 0.5 mg/dL (ref 0.00–1.89)

## 2013-06-25 LAB — NMR LIPOPROFILE WITH LIPIDS
Cholesterol, Total: 204 mg/dL — ABNORMAL HIGH (ref <200)
HDL Particle Number: 36.3 umol/L (ref >=30.5)
HDL Size: 8.5 nm — ABNORMAL LOW (ref >=9.2)
HDL-C: 42 mg/dL (ref >=40)
LDL (calc): 109 mg/dL — ABNORMAL HIGH (ref <100)
LDL Particle Number: 1732 nmol/L — ABNORMAL HIGH (ref <1000)
LDL Size: 19.9 nm — ABNORMAL LOW (ref >20.5)
LP-IR Score: 81 — ABNORMAL HIGH (ref <=45)
Large HDL-P: 1.6 umol/L — ABNORMAL LOW (ref >=4.8)
Large VLDL-P: 8.2 nmol/L — ABNORMAL HIGH (ref <=2.7)
Small LDL Particle Number: 1170 nmol/L — ABNORMAL HIGH (ref <=527)
Triglycerides: 267 mg/dL — ABNORMAL HIGH (ref <150)
VLDL Size: 46.6 nm (ref <=46.6)

## 2013-06-26 ENCOUNTER — Ambulatory Visit (INDEPENDENT_AMBULATORY_CARE_PROVIDER_SITE_OTHER): Payer: BC Managed Care – PPO | Admitting: Psychology

## 2013-06-26 DIAGNOSIS — F339 Major depressive disorder, recurrent, unspecified: Secondary | ICD-10-CM

## 2013-06-26 DIAGNOSIS — F419 Anxiety disorder, unspecified: Secondary | ICD-10-CM

## 2013-06-26 DIAGNOSIS — F411 Generalized anxiety disorder: Secondary | ICD-10-CM

## 2013-06-27 ENCOUNTER — Telehealth: Payer: Self-pay | Admitting: Nurse Practitioner

## 2013-06-27 NOTE — Telephone Encounter (Signed)
Received ferritin and iron studies from Dr. Modesto Charon. Called patient and asked her take supplemental iron OTC daily. She is agreeable.

## 2013-06-28 ENCOUNTER — Encounter (HOSPITAL_COMMUNITY): Payer: Self-pay | Admitting: Psychology

## 2013-06-28 NOTE — Progress Notes (Signed)
[name]   DOB: 06/05/1956  MR Number: [id number]  Location: [facility] [address] [place] [address] Dept: [phone]  Start: 4 PM End: 5 PM  Provider/Observer:     Hershal Coria PSYD  Chief Complaint:      Chief Complaint  Patient presents with  . Depression  . Stress  . Anxiety    Reason For Service:     I seen the patient is also known to 4 several years. She was initially referred here for difficulty coping with the situation involving her children. Her youngest son is at severe difficulties over the years with regard to mood disorder, substance abuse and behavioral problems. Now there are major stressors associated with him again the patient has been essentially overwhelmed and unsure about what to do about. She reports that this has created a lot of stress both at home and at work. On top of that, her diabetes has gotten to the point that she had hammertoe amputated because of that. She has neuropathy as a result of diabetes as well and has had to stay out of her foot for 8 weeks.  Interventions Strategy:  Cognitive/behavioral psychotherapeutic interventions  Participation Level:   Active  Participation Quality:  Appropriate      Behavioral Observation:  Well Groomed, Alert, and Depressed.   Current Psychosocial Factors: The patient comes in today reporting that her son is doing much better and is started working a job although much of it is under the table and she is concerned about the employer take advantage of him the situation. However, he has become much more stable in his mood and he and his girlfriend are not fighting and the girlfriend has not causing conflict with the patient at all. The patient reports that she is continuing to struggle with medical effects and issues with her diabetes and is concerned that she may lose another if she is not able to get some  of the wounds healed.  Content of Session:   Review current symptoms and continued work on building improve coping skills  Current Status:   The patient continues to show improvements as her stressors is in her life have been improving.  Last Reviewed:   06/28/2013  Goals Addressed Today:    Goals addressed included building better coping skills.  Impression/Diagnosis:   The patient has a long history of attention deficit disorder but do to Maj. psychosocial stressors she also developed clinical depression and anxiety. I think that her attentional problems were valid prior to the development of these issues that existed her entire life. However, she is in and repeat if and recurrent trauma with regard primarily to her son and to her daughter to a lesser degree in years past.  Diagnosis:    Axis I:  Major depression, recurrent  Anxiety      Axis II: No diagnosis

## 2013-07-15 ENCOUNTER — Encounter: Payer: Self-pay | Admitting: Family Medicine

## 2013-07-15 ENCOUNTER — Ambulatory Visit (INDEPENDENT_AMBULATORY_CARE_PROVIDER_SITE_OTHER): Payer: BC Managed Care – PPO | Admitting: Family Medicine

## 2013-07-15 ENCOUNTER — Telehealth: Payer: Self-pay | Admitting: Family Medicine

## 2013-07-15 ENCOUNTER — Ambulatory Visit: Payer: Self-pay

## 2013-07-15 VITALS — BP 134/85 | HR 93 | Temp 98.0°F | Wt 191.0 lb

## 2013-07-15 DIAGNOSIS — R609 Edema, unspecified: Secondary | ICD-10-CM

## 2013-07-15 MED ORDER — FUROSEMIDE 20 MG PO TABS: ORAL_TABLET | ORAL | Status: DC

## 2013-07-15 NOTE — Telephone Encounter (Signed)
appt made

## 2013-07-15 NOTE — Patient Instructions (Signed)
Edema  Edema is an abnormal build-up of fluids in tissues. Because this is partly dependent on gravity (water flows to the lowest place), it is more common in the legs and thighs (lower extremities). It is also common in the looser tissues, like around the eyes. Painless swelling of the feet and ankles is common and increases as a person ages. It may affect both legs and may include the calves or even thighs. When squeezed, the fluid may move out of the affected area and may leave a dent for a few moments.  CAUSES    Prolonged standing or sitting in one place for extended periods of time. Movement helps pump tissue fluid into the veins, and absence of movement prevents this, resulting in edema.   Varicose veins. The valves in the veins do not work as well as they should. This causes fluid to leak into the tissues.   Fluid and salt overload.   Injury, burn, or surgery to the leg, ankle, or foot, may damage veins and allow fluid to leak out.   Sunburn damages vessels. Leaky vessels allow fluid to go out into the sunburned tissues.   Allergies (from insect bites or stings, medications or chemicals) cause swelling by allowing vessels to become leaky.   Protein in the blood helps keep fluid in your vessels. Low protein, as in malnutrition, allows fluid to leak out.   Hormonal changes, including pregnancy and menstruation, cause fluid retention. This fluid may leak out of vessels and cause edema.   Medications that cause fluid retention. Examples are sex hormones, blood pressure medications, steroid treatment, or anti-depressants.   Some illnesses cause edema, especially heart failure, kidney disease, or liver disease.   Surgery that cuts veins or lymph nodes, such as surgery done for the heart or for breast cancer, may result in edema.  DIAGNOSIS   Your caregiver is usually easily able to determine what is causing your swelling (edema) by simply asking what is wrong (getting a history) and examining you (doing  a physical). Sometimes x-rays, EKG (electrocardiogram or heart tracing), and blood work may be done to evaluate for underlying medical illness.  TREATMENT   General treatment includes:   Leg elevation (or elevation of the affected body part).   Restriction of fluid intake.   Prevention of fluid overload.   Compression of the affected body part. Compression with elastic bandages or support stockings squeezes the tissues, preventing fluid from entering and forcing it back into the blood vessels.   Diuretics (also called water pills or fluid pills) pull fluid out of your body in the form of increased urination. These are effective in reducing the swelling, but can have side effects and must be used only under your caregiver's supervision. Diuretics are appropriate only for some types of edema.  The specific treatment can be directed at any underlying causes discovered. Heart, liver, or kidney disease should be treated appropriately.  HOME CARE INSTRUCTIONS    Elevate the legs (or affected body part) above the level of the heart, while lying down.   Avoid sitting or standing still for prolonged periods of time.   Avoid putting anything directly under the knees when lying down, and do not wear constricting clothing or garters on the upper legs.   Exercising the legs causes the fluid to work back into the veins and lymphatic channels. This may help the swelling go down.   The pressure applied by elastic bandages or support stockings can help reduce ankle swelling.     A low-salt diet may help reduce fluid retention and decrease the ankle swelling.   Take any medications exactly as prescribed.  SEEK MEDICAL CARE IF:   Your edema is not responding to recommended treatments.  SEEK IMMEDIATE MEDICAL CARE IF:    You develop shortness of breath or chest pain.   You cannot breathe when you lay down; or if, while lying down, you have to get up and go to the window to get your breath.   You are having increasing  swelling without relief from treatment.   You develop a fever over 102 F (38.9 C).   You develop pain or redness in the areas that are swollen.   Tell your caregiver right away if you have gained 3 lb/1.4 kg in 1 day or 5 lb/2.3 kg in a week.  MAKE SURE YOU:    Understand these instructions.   Will watch your condition.   Will get help right away if you are not doing well or get worse.  Document Released: 11/28/2005 Document Revised: 05/29/2012 Document Reviewed: 07/16/2008  [employer] Patient Information 2014 [employer].

## 2013-07-15 NOTE — Progress Notes (Signed)
  Subjective:    Patient ID: Rhonda Thomas, female    DOB: 02-Sep-1956, 57 y.o.   MRN: [id number]  HPI This 57 y.o. female presents for evaluation of swelling and edema in her legs.  She states  her feet bother her more and that her neuropathy is worse since her feet are swelling..   Review of Systems C/o edema No chest pain, SOB, HA, dizziness, vision change, N/V, diarrhea, constipation, dysuria, urinary urgency or frequency, myalgias, arthralgias or rash.     Objective:   Physical Exam Vital signs noted  Well developed well nourished female.  HEENT - Head atraumatic Normocephalic Respiratory - Lungs CTA bilateral Cardiac - RRR S1 and S2 without murmur GI - Abdomen soft Nontender and bowel sounds active x 4 Extremities - Edema bilateral ankles and pre-tibial area. Neuro - Grossly intact.       Assessment & Plan:  Edema Lasix 20mg  one po qd 2-3 days prn, elevate legs and wear compression stockings

## 2013-07-17 ENCOUNTER — Ambulatory Visit (INDEPENDENT_AMBULATORY_CARE_PROVIDER_SITE_OTHER): Payer: BC Managed Care – PPO | Admitting: Psychology

## 2013-07-17 DIAGNOSIS — F339 Major depressive disorder, recurrent, unspecified: Secondary | ICD-10-CM

## 2013-07-19 ENCOUNTER — Encounter (HOSPITAL_COMMUNITY): Payer: Self-pay | Admitting: Psychology

## 2013-07-19 NOTE — Progress Notes (Signed)
[name]   DOB: February 12, 1956  MR Number: [id number]  Location: [facility] [address] [place] [address] Dept: [phone]  Start: 4 PM End: 5 PM  Provider/Observer:     Hershal Coria PSYD  Chief Complaint:      Chief Complaint  Patient presents with  . Anxiety  . Depression    Reason For Service:     I seen the patient is also known to 4 several years. She was initially referred here for difficulty coping with the situation involving her children. Her youngest son is at severe difficulties over the years with regard to mood disorder, substance abuse and behavioral problems. Now there are major stressors associated with him again the patient has been essentially overwhelmed and unsure about what to do about. She reports that this has created a lot of stress both at home and at work. On top of that, her diabetes has gotten to the point that she had hammertoe amputated because of that. She has neuropathy as a result of diabetes as well and has had to stay out of her foot for 8 weeks.  Interventions Strategy:  Cognitive/behavioral psychotherapeutic interventions  Participation Level:   Active  Participation Quality:  Appropriate      Behavioral Observation:  Well Groomed, Alert, and Depressed.   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 There is another individual that has caused problems in the past I tried to manipulate control group such as this to take it upon herself to send an e-mail stating that this other lady would be a chair. The patient reports that she worries about attempts at manipulating the patient and causing trouble but is hoping to be able to adjust to it.  Content of  Session:   Review current symptoms and continued work on building improve coping skills  Current Status:   The patient continues to show improvements as her stressors is in her life have been improving.  Last Reviewed:   07/17/2013  Goals Addressed Today:    Goals addressed included building better coping skills.  Impression/Diagnosis:   The patient has a long history of attention deficit disorder but do to Maj. psychosocial stressors she also developed clinical depression and anxiety. I think that her attentional problems were valid prior to the development of these issues that existed her entire life. However, she is in and repeat if and recurrent trauma with regard primarily to her son and to her daughter to a lesser degree in years past.  Diagnosis:    Axis I:  Major depression, recurrent      Axis II: No diagnosis

## 2013-08-03 ENCOUNTER — Ambulatory Visit (INDEPENDENT_AMBULATORY_CARE_PROVIDER_SITE_OTHER): Payer: BC Managed Care – PPO | Admitting: Family Medicine

## 2013-08-03 VITALS — BP 120/80 | HR 82 | Temp 97.2°F | Ht 67.0 in | Wt 187.0 lb

## 2013-08-03 DIAGNOSIS — G4700 Insomnia, unspecified: Secondary | ICD-10-CM

## 2013-08-03 DIAGNOSIS — L309 Dermatitis, unspecified: Secondary | ICD-10-CM

## 2013-08-03 DIAGNOSIS — J069 Acute upper respiratory infection, unspecified: Secondary | ICD-10-CM

## 2013-08-03 DIAGNOSIS — L259 Unspecified contact dermatitis, unspecified cause: Secondary | ICD-10-CM

## 2013-08-03 MED ORDER — AZITHROMYCIN 250 MG PO TABS: ORAL_TABLET | ORAL | Status: DC

## 2013-08-03 MED ORDER — NYSTATIN-TRIAMCINOLONE 100000-0.1 UNIT/GM-% EX OINT: TOPICAL_OINTMENT | Freq: Two times a day (BID) | CUTANEOUS | Status: DC

## 2013-08-03 MED ORDER — ZOLPIDEM TARTRATE 10 MG PO TABS: 10.0000 mg | ORAL_TABLET | Freq: Every evening | ORAL | Status: DC | PRN

## 2013-08-03 NOTE — Progress Notes (Signed)
  Subjective:    Patient ID: Rhonda Thomas, female    DOB: 27-Nov-1956, 57 y.o.   MRN: [id number]  HPI This 57 y.o. female presents for evaluation of URI sx's, rash on abdomen, and refill on her sleep medicine. She is having difficulty with insomnia and she uses ambien on occasion for sleep.  She has been having uri Sx's and she has rash on her abdomen..   Review of Systems C/o rash, insomnia, and URI sx's. No chest pain, SOB, HA, dizziness, vision change, N/V, diarrhea, constipation, dysuria, urinary urgency or frequency, myalgias, arthralgias or rash.     Objective:   Physical Exam Vital signs noted  Well developed well nourished female.  HEENT - Head atraumatic Normocephalic                Eyes - PERRLA, Conjuctiva - clear Sclera- Clear EOMI                Ears - EAC's Wnl TM's Wnl Gross Hearing WNL                Nose - Nares patent                 Throat - oropharanx wnl Respiratory - Lungs CTA bilateral Cardiac - RRR S1 and S2 without murmur GI - Abdomen soft Nontender and bowel sounds active x 4 Extremities - No edema. Neuro - Grossly intact.       Assessment & Plan:  Insomnia - Plan: zolpidem (AMBIEN) 10 MG tablet  URI (upper respiratory infection) - Plan: azithromycin (ZITHROMAX) 250 MG tablet And follow up prn  Dermatitis - Plan: nystatin-triamcinolone ointment (MYCOLOG)

## 2013-08-14 ENCOUNTER — Encounter: Payer: Self-pay | Admitting: Pharmacist

## 2013-08-14 ENCOUNTER — Ambulatory Visit: Payer: Self-pay

## 2013-08-14 ENCOUNTER — Ambulatory Visit (INDEPENDENT_AMBULATORY_CARE_PROVIDER_SITE_OTHER): Payer: BC Managed Care – PPO | Admitting: Pharmacist

## 2013-08-14 ENCOUNTER — Telehealth: Payer: Self-pay | Admitting: Pharmacist

## 2013-08-14 VITALS — BP 120/70 | Ht 67.0 in | Wt 190.5 lb

## 2013-08-14 DIAGNOSIS — E785 Hyperlipidemia, unspecified: Secondary | ICD-10-CM

## 2013-08-14 DIAGNOSIS — E119 Type 2 diabetes mellitus without complications: Secondary | ICD-10-CM

## 2013-08-14 MED ORDER — GLUCOSE BLOOD VI STRP: ORAL_STRIP | Status: DC

## 2013-08-14 MED ORDER — CANAGLIFLOZIN 100 MG PO TABS: 100.0000 mg | ORAL_TABLET | Freq: Every morning | ORAL | Status: DC

## 2013-08-14 MED ORDER — ROSUVASTATIN CALCIUM 10 MG PO TABS: 10.0000 mg | ORAL_TABLET | Freq: Every day | ORAL | Status: DC

## 2013-08-14 NOTE — Progress Notes (Signed)
Diabetes Follow-Up Visit Chief Complaint:  No chief complaint on file.    Filed Vitals:   08/14/13 1550  BP: 120/70     HPI: patient with long standing type 2 DM.  She has not been checking BG recently due to batteries dead in glucometer. Recent lipid panel showed elevated LDL and TG - has never taken statin but does have significant history of neuropathy and RLS pain.  Current Diabetes Medications:  Janumet XR 50/1000mg  1 tablet BID, Lantus 10 units at bedtime, amaryl 4mg  1 tablet BID  Exam Edema:  trace  Polyuria:  negative  Polydipsia:  negative Polyphagia:  negatvie  BMI:  Body mass index is 29.83 kg/(m^2).   Weight changes:  stable General Appearance:  alert, oriented, no acute distress and well nourished Mood/Affect:  normal   Low fat/carbohydrate diet?  No Nicotine Abuse?  No Medication Compliance?  Yes Exercise?  No Alcohol Abuse?  No  Home BG Monitoring:  Checking 0 times a day.    Lab Results  Component Value Date   HGBA1C 7.0% 06/21/2013    Lab Results  Component Value Date   MICROALBUR 0.50 06/21/2013    Lab Results  Component Value Date   LDLCALC 109* 06/21/2013   TRIG 267* 06/21/2013      Assessment: 1.  Diabetes.  Would like to see A1c less than 7.0% 2.  Blood Pressure.  good 3.  Lipids.  Elevated LDL-P and Tg   Recommendations: 1.  Medication recommendations at this time are as follows:  Discontinue lantus.  Start Invokana 100mg  1 tablet each morning.  Gave Verio IQ glucometer and Rx for test strips Start Crestor 10mg  1 tablet daily 2.  Reviewed HBG goals:  Fasting 80-130 and 1-2 hour post prandial <180.  Patient is instructed to check BG 2 times per day.    3.  BP goal < 140/80. 4.  LDL goal of < 100, HDL > 40 and TG < 150. 5.  Eye Exam yearly and Dental Exam every 6 months. 6.  Dietary recommendations:  Discussed limiting high CHO / sugar foods 7.  Physical Activity recommendations:  30 min daily 8.  Patient education on appropriate foot  care.   9.  Return to clinic in 4-6 wks   Time spent counseling patient:  30 minutes

## 2013-08-14 NOTE — Telephone Encounter (Signed)
This has already been done - second Rx sent to [facility] with directions and diagnosis.

## 2013-08-26 ENCOUNTER — Telehealth: Payer: Self-pay | Admitting: Family Medicine

## 2013-08-26 NOTE — Telephone Encounter (Signed)
Sore throat, fever, nasal discomfort, and back pain developed today. Suggested warm salt water gargles, ibuprofen, increase fluids, throat lozenges or throat spray, and rest. Patient will call in the morning if symptoms worsen or persist. Pt stated understanding and agreement to plan.

## 2013-08-29 ENCOUNTER — Ambulatory Visit: Payer: BC Managed Care – PPO | Admitting: Family Medicine

## 2013-08-29 ENCOUNTER — Ambulatory Visit (INDEPENDENT_AMBULATORY_CARE_PROVIDER_SITE_OTHER): Payer: BC Managed Care – PPO | Admitting: Family Medicine

## 2013-08-29 ENCOUNTER — Encounter: Payer: Self-pay | Admitting: Family Medicine

## 2013-08-29 VITALS — BP 152/88 | HR 87 | Temp 97.1°F | Ht 67.0 in | Wt 186.0 lb

## 2013-08-29 DIAGNOSIS — J029 Acute pharyngitis, unspecified: Secondary | ICD-10-CM

## 2013-08-29 DIAGNOSIS — R05 Cough: Secondary | ICD-10-CM

## 2013-08-29 DIAGNOSIS — J322 Chronic ethmoidal sinusitis: Secondary | ICD-10-CM

## 2013-08-29 LAB — POCT RAPID STREP A (OFFICE): Rapid Strep A Screen: NEGATIVE

## 2013-08-29 MED ORDER — AMOXICILLIN 500 MG PO CAPS: 500.0000 mg | ORAL_CAPSULE | Freq: Three times a day (TID) | ORAL | Status: DC

## 2013-08-29 NOTE — Patient Instructions (Addendum)
Continue current medications. Continue good therapeutic lifestyle changes.   Schedule your flu vaccine the first of October.  Follow up as planned and earlier as needed.  Take antibiotic as directed Continue Mucinex for congestion and loosening drainage Use saline irrigation of nasal sinuses 3- 4 times daily

## 2013-08-29 NOTE — Progress Notes (Signed)
Subjective:    Patient ID: Rhonda Thomas, female    DOB: 1956/04/09, 57 y.o.   MRN: [id number]  HPI pt here today for sinus, congestion, fever for 4 days. Patient Active Problem List   Diagnosis Date Noted  . Generalized anxiety disorder 06/21/2013  . DM (diabetes mellitus) 06/21/2013  . Attention deficit disorder without mention of hyperactivity 06/21/2013  . Restless legs 06/21/2013  . HLD (hyperlipidemia) 06/21/2013  . Insomnia 06/21/2013  . Restless legs syndrome (RLS) 06/20/2013  . Type II or unspecified type diabetes mellitus without mention of complication, not stated as uncontrolled 06/20/2013  . Unspecified hereditary and idiopathic peripheral neuropathy 06/20/2013   Outpatient Encounter Prescriptions as of 08/29/2013  Medication Sig Dispense Refill  . amphetamine-dextroamphetamine (ADDERALL XR) 20 MG 24 hr capsule Take 1 capsule (20 mg total) by mouth every morning.  30 capsule  0  . Canagliflozin (INVOKANA) 100 MG TABS Take 1 tablet (100 mg total) by mouth every morning.  35 tablet  0  . carbidopa-levodopa (SINEMET IR) 25-100 MG per tablet Take 1 tablet by mouth 3 (three) times daily.  90 tablet  6  . furosemide (LASIX) 20 MG tablet Take one po qd x 2-3 days prn swelling.  30 tablet  3  . glimepiride (AMARYL) 4 MG tablet Take 1 tablet (4 mg total) by mouth 2 (two) times daily before a meal.  30 tablet  11  . glucose blood (ONETOUCH VERIO) test strip Use to check BG up to twice a day (dx: 250.02 uncontrolled type 2 DM)  100 each  6  . HYDROcodone-acetaminophen (NORCO) 10-325 MG per tablet Take 1 tablet by mouth daily.       Marland Kitchen nystatin-triamcinolone ointment (MYCOLOG) Apply topically 2 (two) times daily.  30 g  0  . pregabalin (LYRICA) 150 MG capsule Take 1 capsule (150 mg total) by mouth 3 (three) times daily.  90 capsule  6  . rosuvastatin (CRESTOR) 10 MG tablet Take 1 tablet (10 mg total) by mouth daily.  42 tablet  0  . SitaGLIPtin-MetFORMIN HCl (JANUMET XR) 50-1000 MG TB24  Take 1 tablet by mouth 2 (two) times daily.  60 tablet  11  . zolpidem (AMBIEN) 10 MG tablet Take 1 tablet (10 mg total) by mouth at bedtime as needed.  30 tablet  0   No facility-administered encounter medications on file as of 08/29/2013.       Review of Systems  Constitutional: Positive for fever.  HENT: Positive for congestion, sore throat (better now), postnasal drip and sinus pressure.   Eyes: Negative.   Respiratory: Positive for cough.   Cardiovascular: Negative.   Gastrointestinal: Negative.   Endocrine: Negative.   Genitourinary: Negative.   Musculoskeletal: Positive for back pain (better now- started sunday at onset of other problems). Arthralgias: right knee pain- w/c problem.  Skin: Negative.   Allergic/Immunologic: Negative.   Neurological: Negative.   Hematological: Negative.   Psychiatric/Behavioral: Negative.        Objective:   Physical Exam  Nursing note and vitals reviewed. Constitutional: She is oriented to person, place, and time. [name]. No distress.  HENT:  Head: Normocephalic and atraumatic.  Right Ear: External ear normal.  Left Ear: External ear normal.  Mouth/Throat: No oropharyngeal exudate.  Nasal congestion bilaterally Throat red posteriorly Slight ethmoid tenderness  Eyes: Conjunctivae and EOM are normal. Right eye exhibits no discharge. Left eye exhibits no discharge. No scleral icterus.  Neck: Normal range of motion. Neck supple.  No thyromegaly present.  Cardiovascular: Normal rate, regular rhythm and normal heart sounds.   No murmur heard. Pulmonary/Chest: Effort normal and breath sounds normal. No respiratory distress. She has no wheezes. She has no rales.  Dry cough  Musculoskeletal: Normal range of motion.  History of knee contusion and abrasion one month ago X-ray today appeared to be within normal limits  Lymphadenopathy:    She has no cervical adenopathy.  Neurological: She is alert and  oriented to person, place, and time.  Skin: Skin is warm and dry. No rash noted.  Psychiatric: She has a normal mood and affect. [name]. Judgment and thought content normal.           Assessment & Plan:   1. Ethmoid sinusitis   2. Sore throat   3. Cough    Orders Placed This Encounter  Procedures  . Strep A culture, throat  . POCT rapid strep A   Meds ordered this encounter  Medications  . amoxicillin (AMOXIL) 500 MG capsule    Sig: Take 1 capsule (500 mg total) by mouth 3 (three) times daily.    Dispense:  30 capsule    Refill:  0   Patient Instructions  Continue current medications. Continue good therapeutic lifestyle changes.   Schedule your flu vaccine the first of October.  Follow up as planned and earlier as needed.  Take antibiotic as directed Continue Mucinex for congestion and loosening drainage Use saline irrigation of nasal sinuses 3- 4 times daily   Nyra Capes MD

## 2013-09-01 LAB — STREP A CULTURE, THROAT: Strep A Culture: NEGATIVE

## 2013-09-09 ENCOUNTER — Telehealth: Payer: Self-pay | Admitting: *Deleted

## 2013-09-09 NOTE — Telephone Encounter (Addendum)
Message copied by Baltazar Apo on Mon Sep 09, 2013 10:58 AM ------      Message from: Ernestina Penna      Created: Sun Sep 01, 2013  8:36 PM       Strep culture was negative, if taking antibiotic,  patient should complete the course of antibiotics -----PT NOTIFIED.

## 2013-09-10 ENCOUNTER — Other Ambulatory Visit: Payer: Self-pay

## 2013-09-10 DIAGNOSIS — G4700 Insomnia, unspecified: Secondary | ICD-10-CM

## 2013-09-10 MED ORDER — ZOLPIDEM TARTRATE 10 MG PO TABS: 10.0000 mg | ORAL_TABLET | Freq: Every evening | ORAL | Status: DC | PRN

## 2013-09-10 NOTE — Telephone Encounter (Signed)
This is okay x1 with one refill Patient should try to take less medication and reduce this by one half nightly

## 2013-09-10 NOTE — Telephone Encounter (Signed)
Last seen 08/29/13  DWM  If approved route to nurse to phone into [facility]  [phone]

## 2013-09-11 MED ORDER — ZOLPIDEM TARTRATE 10 MG PO TABS: 10.0000 mg | ORAL_TABLET | Freq: Every evening | ORAL | Status: DC | PRN

## 2013-09-11 NOTE — Telephone Encounter (Signed)
Phoned into [facility] and left authorization to refill with one additional refill.

## 2013-09-11 NOTE — Addendum Note (Signed)
Addended by: Gwenith Daily on: 09/11/2013 06:11 PM   Modules accepted: Orders

## 2013-09-30 ENCOUNTER — Other Ambulatory Visit: Payer: Self-pay | Admitting: Nurse Practitioner

## 2013-10-01 NOTE — Telephone Encounter (Signed)
You may refill this prescription through December. The patient will need an appointment for pelvic exam in January

## 2013-10-01 NOTE — Telephone Encounter (Signed)
Last seen 01/03/13  [facility]

## 2013-10-17 ENCOUNTER — Ambulatory Visit (INDEPENDENT_AMBULATORY_CARE_PROVIDER_SITE_OTHER): Payer: BC Managed Care – PPO | Admitting: Family Medicine

## 2013-10-17 ENCOUNTER — Encounter: Payer: Self-pay | Admitting: Family Medicine

## 2013-10-17 VITALS — BP 146/75 | HR 69 | Temp 97.2°F | Ht 67.0 in | Wt 185.0 lb

## 2013-10-17 DIAGNOSIS — J019 Acute sinusitis, unspecified: Secondary | ICD-10-CM

## 2013-10-17 DIAGNOSIS — G4700 Insomnia, unspecified: Secondary | ICD-10-CM

## 2013-10-17 DIAGNOSIS — J322 Chronic ethmoidal sinusitis: Secondary | ICD-10-CM

## 2013-10-17 DIAGNOSIS — F988 Other specified behavioral and emotional disorders with onset usually occurring in childhood and adolescence: Secondary | ICD-10-CM

## 2013-10-17 MED ORDER — ZOLPIDEM TARTRATE 10 MG PO TABS: ORAL_TABLET | ORAL | Status: DC

## 2013-10-17 MED ORDER — AMOXICILLIN 500 MG PO CAPS: 500.0000 mg | ORAL_CAPSULE | Freq: Three times a day (TID) | ORAL | Status: DC

## 2013-10-17 MED ORDER — AMPHETAMINE-DEXTROAMPHET ER 20 MG PO CP24: 20.0000 mg | ORAL_CAPSULE | ORAL | Status: DC

## 2013-10-17 NOTE — Progress Notes (Signed)
  Subjective:    Patient ID: Rhonda Thomas, female    DOB: [birth year], 57 y.o.   MRN: [id number]  Sinusitis This is a recurrent problem. The current episode started in the past 7 days. The problem has been gradually worsening since onset. There has been no fever. Her pain is at a severity of 5/10. The pain is moderate. Associated symptoms include chills, congestion, headaches, a hoarse voice and sinus pressure. Pertinent negatives include no coughing, diaphoresis, ear pain, neck pain, shortness of breath, sneezing, sore throat or swollen glands. Past treatments include acetaminophen and oral decongestants. The treatment provided no relief.      Review of Systems  Constitutional: Positive for chills. Negative for diaphoresis.  HENT: Positive for congestion, hoarse voice and sinus pressure. Negative for ear pain, sneezing and sore throat.   Eyes: Negative.   Respiratory: Negative for cough and shortness of breath.   Cardiovascular: Negative.   Gastrointestinal: Negative.   Endocrine: Negative.   Genitourinary: Negative.   Musculoskeletal: Negative.  Negative for neck pain.  Allergic/Immunologic: Negative.   Neurological: Positive for headaches.  Hematological: Negative.   Psychiatric/Behavioral: Negative.        Objective:   Physical Exam        Assessment & Plan:

## 2013-10-17 NOTE — Patient Instructions (Signed)
Saline irrigation would be helpful for your sinus congestion Consider using a Nettie pot-----irrigation 4 times daily for a week would be very helpful Keep house cooler Use a cool mist humidifier Take Tylenol for aches pains and fever Take medication as directed

## 2013-10-17 NOTE — Progress Notes (Signed)
Subjective:    Patient ID: Rhonda Thomas, female    DOB: [birth year], 57 y.o.   MRN: [id number]  HPI Patient here today for sinus trouble and med refill. Patient comes in today with complaints of sinus pressure and congestion for 4-5 days. She says there has been no fever. There has been no cough. The drainage is somewhat yellow. Over that period of time it seems to have gotten better and then worse again she does not give any history of any seasonal allergy issues.   Patient Active Problem List   Diagnosis Date Noted  . Generalized anxiety disorder 06/21/2013  . Attention deficit disorder without mention of hyperactivity 06/21/2013  . Hyperlipidemia 06/21/2013  . Insomnia 06/21/2013  . Restless legs syndrome (RLS) 06/20/2013  . Type II or unspecified type diabetes mellitus without mention of complication, not stated as uncontrolled 06/20/2013  . Unspecified hereditary and idiopathic peripheral neuropathy 06/20/2013   Outpatient Encounter Prescriptions as of 10/17/2013  Medication Sig  . amphetamine-dextroamphetamine (ADDERALL XR) 20 MG 24 hr capsule Take 1 capsule (20 mg total) by mouth every morning.  . Canagliflozin (INVOKANA) 100 MG TABS Take 1 tablet (100 mg total) by mouth every morning.  . carbidopa-levodopa (SINEMET IR) 25-100 MG per tablet Take 1 tablet by mouth 3 (three) times daily.  . furosemide (LASIX) 20 MG tablet Take one po qd x 2-3 days prn swelling.  Marland Kitchen glimepiride (AMARYL) 4 MG tablet Take 1 tablet (4 mg total) by mouth 2 (two) times daily before a meal.  . glucose blood (ONETOUCH VERIO) test strip Use to check BG up to twice a day (dx: 250.02 uncontrolled type 2 DM)  . HYDROcodone-acetaminophen (NORCO) 10-325 MG per tablet Take 1 tablet by mouth daily.   Marland Kitchen nystatin-triamcinolone ointment (MYCOLOG) Apply topically 2 (two) times daily.  . pregabalin (LYRICA) 150 MG capsule Take 1 capsule (150 mg total) by mouth 3 (three) times daily.  Marland Kitchen PREMARIN 1.25 MG tablet TAKE 1 TABLET  BY MOUTH EVERY DAY  . rosuvastatin (CRESTOR) 10 MG tablet Take 1 tablet (10 mg total) by mouth daily.  . SitaGLIPtin-MetFORMIN HCl (JANUMET XR) 50-1000 MG TB24 Take 1 tablet by mouth 2 (two) times daily.  Marland Kitchen zolpidem (AMBIEN) 10 MG tablet Take 1 tablet (10 mg total) by mouth at bedtime as needed.  . [DISCONTINUED] amoxicillin (AMOXIL) 500 MG capsule Take 1 capsule (500 mg total) by mouth 3 (three) times daily.    Review of Systems  Constitutional: Positive for chills.  HENT: Positive for congestion, postnasal drip and sinus pressure. Negative for ear pain and trouble swallowing.   Eyes: Negative.   Respiratory: Negative.  Negative for cough and shortness of breath.   Cardiovascular: Negative.   Gastrointestinal: Negative.   Endocrine: Negative.   Genitourinary: Negative.   Musculoskeletal: Negative.   Skin: Negative.   Allergic/Immunologic: Negative.   Neurological: Negative.  Negative for dizziness and headaches.  Hematological: Negative.   Psychiatric/Behavioral: Negative.        Objective:   Physical Exam  Nursing note and vitals reviewed. Constitutional: She is oriented to person, place, and time. [name].  HENT:  Head: Normocephalic and atraumatic.  Right Ear: External ear normal.  Left Ear: External ear normal.  Mouth/Throat: Oropharynx is clear and moist. No oropharyngeal exudate.  Nasal congestion bilaterally. There was slight maxillary sinus tenderness but no ethmoid or frontal tenderness  Eyes: Conjunctivae are normal. Pupils are equal, round, and reactive to light. Right eye exhibits no discharge.  Left eye exhibits no discharge.  Neck: Normal range of motion. Neck supple. No thyromegaly present.  Cardiovascular: Normal rate, regular rhythm and normal heart sounds.  Exam reveals no gallop.   No murmur heard. Pulmonary/Chest: Effort normal and breath sounds normal. She has no wheezes. She has no rales.  Musculoskeletal: Normal  range of motion.  Lymphadenopathy:    She has no cervical adenopathy.  Neurological: She is alert and oriented to person, place, and time.  Skin: Skin is warm and dry. No rash noted.  Psychiatric: She has a normal mood and affect. [name]. Judgment and thought content normal.   BP 145/93  Pulse 84  Temp(Src) 97.2 F (36.2 C) (Oral)  Ht 5\' 7"  (1.702 m)  Wt 185 lb (83.915 kg)  BMI 28.97 kg/m2        Assessment & Plan:   1. Acute rhinosinusitis   2. Insomnia   3. Attention deficit disorder without mention of hyperactivity   4. Ethmoid sinusitis    No orders of the defined types were placed in this encounter.   Meds ordered this encounter  Medications  . zolpidem (AMBIEN) 10 MG tablet    Sig: 1/2 to 1 whole tab at bedtime as needed for sleep.    Dispense:  30 tablet    Refill:  0    Order Specific Question:  Supervising Provider    Answer:  Ernestina Penna [[id number]]  . amphetamine-dextroamphetamine (ADDERALL XR) 20 MG 24 hr capsule    Sig: Take 1 capsule (20 mg total) by mouth every morning.    Dispense:  30 capsule    Refill:  0  . amoxicillin (AMOXIL) 500 MG capsule    Sig: Take 1 capsule (500 mg total) by mouth 3 (three) times daily.    Dispense:  30 capsule    Refill:  0   Patient Instructions  Saline irrigation would be helpful for your sinus congestion Consider using a Nettie pot-----irrigation 4 times daily for a week would be very helpful Keep house cooler Use a cool mist humidifier Take Tylenol for aches pains and fever Take medication as directed    Nyra Capes MD

## 2013-10-26 ENCOUNTER — Encounter: Payer: Self-pay | Admitting: General Practice

## 2013-10-26 ENCOUNTER — Ambulatory Visit (INDEPENDENT_AMBULATORY_CARE_PROVIDER_SITE_OTHER): Payer: BC Managed Care – PPO | Admitting: General Practice

## 2013-10-26 VITALS — BP 134/76 | HR 82 | Temp 97.1°F | Ht 67.0 in | Wt 185.0 lb

## 2013-10-26 DIAGNOSIS — L089 Local infection of the skin and subcutaneous tissue, unspecified: Secondary | ICD-10-CM

## 2013-10-26 NOTE — Progress Notes (Signed)
  Subjective:    Patient ID: Rhonda Thomas, female    DOB: [birth year], 57 y.o.   MRN: [id number]  HPI Patient presents today with complaints of right great toe tenderness and swelling times 3 weeks. She reports onset [place]. She reports the skin initially been irritated. She reports a history of neuropathy and recurrent toe infections. She reports being seen by Dr. Ulice Brilliant in the past, but denies him seeing this time. Reports a history of 2nd toe right toe partial amputation due to bone infection.  She reports drainage (bloody/pus) began this morning.   Review of Systems  Constitutional: Negative for fever and chills.  Respiratory: Negative for chest tightness and shortness of breath.   Cardiovascular: Negative for chest pain and palpitations.  Skin:       Right great toe pain and bloody/pus drainage       Objective:   Physical Exam  Constitutional: She is oriented to person, place, and time. [name].  Cardiovascular: Normal rate, regular rhythm and normal heart sounds.   Pulmonary/Chest: Effort normal and breath sounds normal. No respiratory distress. She exhibits no tenderness.  Musculoskeletal: She exhibits edema and tenderness.  Right great toe pain with palpation and attempt to flex. 1+ non pitting edema noted. Blanching positive. Purulent  drainage noted from posterior area with foul smell.  Neurological: She is alert and oriented to person, place, and time.  Skin: Skin is warm and dry.  Psychiatric: She has a normal mood and affect.          Assessment & Plan:  1. Toe infection -patient to go to emergency room for evaluation now (xray needed), possible osteomyelitis -Patient verbalized understanding and has responsible driver -Patient verbalized understanding -Coralie Keens, FNP-C

## 2013-11-13 ENCOUNTER — Ambulatory Visit (INDEPENDENT_AMBULATORY_CARE_PROVIDER_SITE_OTHER): Payer: BC Managed Care – PPO | Admitting: Neurology

## 2013-11-13 ENCOUNTER — Encounter: Payer: Self-pay | Admitting: Neurology

## 2013-11-13 VITALS — BP 147/82 | HR 73 | Ht 67.0 in | Wt 182.0 lb

## 2013-11-13 DIAGNOSIS — F411 Generalized anxiety disorder: Secondary | ICD-10-CM

## 2013-11-13 DIAGNOSIS — E119 Type 2 diabetes mellitus without complications: Secondary | ICD-10-CM

## 2013-11-13 DIAGNOSIS — G2581 Restless legs syndrome: Secondary | ICD-10-CM

## 2013-11-13 DIAGNOSIS — E785 Hyperlipidemia, unspecified: Secondary | ICD-10-CM

## 2013-11-13 DIAGNOSIS — G609 Hereditary and idiopathic neuropathy, unspecified: Secondary | ICD-10-CM

## 2013-11-13 MED ORDER — OXCARBAZEPINE 150 MG PO TABS: 150.0000 mg | ORAL_TABLET | Freq: Two times a day (BID) | ORAL | Status: DC

## 2013-11-13 NOTE — Progress Notes (Signed)
HPI:  Rhonda Thomas, 57 year old  white female returns for followup. She has a history of bilateral  feet paresthesia and restless leg syndrome.  She also has history of insulin dependent DM for more than 13 years now,  around the time of diagnosis, she noticed bilateral plantar feet and toes numbness, tingling, slow progression over the years, now spreading to whole feet.  She complains  of burning, tingling, some time knife stabbing pain from bottom of her feet upwards, increased after bearing weight. Her DM was under suboptimal control, A1C 7.8, now on insulin, she is taking lyrica 150mg  bid, only mild improvement, high copay. She denies bialteral lower extremity weakness, bilateral hands paresthesia, no incontinence.  EEG/[place]  abnormal study. There is electrodiagnostic evidence of mild to moderate length-dependent axonal sensorimotor polyneuropathy. There is also evidence of mild to moderate bilateral carpal tunnel syndromes. There is no evidence of right lumbar sacral radiculopathy. She had right second toe amputation due to infection, she still has bilateral third toe swelling from recent correction surgery, continues to complain of bilateral feet paresthesias, she has restless leg symptoms, difficulty holding his feet and leg still when trying to sleep, she has tried Requip, causing worsening symptoms,  She tried the cream ordered for neuropathy  but had itching, amitriptyline cause bug crawling sensation in her leg. Requip had made her symptoms worse.  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   ROS:  Urinary frequency, insomnia, depression anxiety, restless legs  Physical Exam General: well developed, well nourished, seated, in no evident distress Head: head normocephalic and atraumatic. Oropharynx benign Neck: supple with no carotid  bruits Cardiovascular: regular rate and rhythm, no murmurs Skin: No peripheral edema, right second toe amputation  Neurologic Exam Mental Status: Awake and fully alert. Oriented to place and time. Follows all commands. Speech and language normal.   Cranial Nerves: Fundoscopic exam reveals sharp disc margins. Pupils equal, briskly reactive to light. Extraocular movements full without nystagmus. Visual fields full to confrontation. Hearing intact and symmetric to finger snap. Facial sensation intact. Face, tongue, palate move normally and symmetrically. Neck flexion and extension normal.  Motor: Normal bulk and tone. Normal strength in all tested extremity muscles.No focal weakness Sensory.: Length-dependent decreased light touch, pinprick to mid shin, decreased proprioception, and decreased vibratory sensation to ankle level .  Coordination: Rapid alternating movements normal in all extremities. Finger-to-nose and heel-to-shin performed accurately bilaterally. Gait and Station: Arises from chair without difficulty. Stance is narrow based and cautious. Reflexes: 2+ and symmetric except absent Achilles. Toes downgoing.   ASSESSMENT: 57 years old Caucasian female, with past medical history of diabetes, length-dependent sensory changes consistent with a diabetic peripheral neuropathy.  PLAN:   1. Continue Lyrica at current dose 150mg  TID,  Add on Trileptal 150 mg twice a day. 2. Sinemet 25/100 tid. 3. RTC in 3 month with Eber Jones. 4. I will refer her to endocrinologist for better management of her insulin-dependent diabetes, other neuropathic medications that may consider including Cymbalta, higher dose of Lyrica, if she remains  symptomatic

## 2013-11-18 ENCOUNTER — Ambulatory Visit (INDEPENDENT_AMBULATORY_CARE_PROVIDER_SITE_OTHER): Payer: BC Managed Care – PPO | Admitting: Psychology

## 2013-11-18 DIAGNOSIS — F411 Generalized anxiety disorder: Secondary | ICD-10-CM

## 2013-11-18 DIAGNOSIS — F339 Major depressive disorder, recurrent, unspecified: Secondary | ICD-10-CM

## 2013-11-18 DIAGNOSIS — F419 Anxiety disorder, unspecified: Secondary | ICD-10-CM

## 2013-11-21 ENCOUNTER — Ambulatory Visit (INDEPENDENT_AMBULATORY_CARE_PROVIDER_SITE_OTHER): Payer: BC Managed Care – PPO | Admitting: Endocrinology

## 2013-11-21 ENCOUNTER — Encounter: Payer: Self-pay | Admitting: Endocrinology

## 2013-11-21 VITALS — BP 118/70 | HR 74 | Temp 97.5°F | Ht 68.0 in | Wt 184.0 lb

## 2013-11-21 DIAGNOSIS — E119 Type 2 diabetes mellitus without complications: Secondary | ICD-10-CM

## 2013-11-21 LAB — BASIC METABOLIC PANEL
BUN: 14 mg/dL (ref 6–23)
CO2: 27 mEq/L (ref 19–32)
Calcium: 8.8 mg/dL (ref 8.4–10.5)
Chloride: 102 mEq/L (ref 96–112)
Creatinine, Ser: 0.7 mg/dL (ref 0.4–1.2)
GFR: 96.33 mL/min (ref >60.00)
Glucose, Bld: 171 mg/dL — ABNORMAL HIGH (ref 70–99)
Potassium: 4.4 mEq/L (ref 3.5–5.1)
Sodium: 138 mEq/L (ref 135–145)

## 2013-11-21 LAB — HEMOGLOBIN A1C: Hgb A1c MFr Bld: 7.6 % — ABNORMAL HIGH (ref 4.6–6.5)

## 2013-11-21 MED ORDER — BROMOCRIPTINE MESYLATE 2.5 MG PO TABS: 2.5000 mg | ORAL_TABLET | Freq: Every day | ORAL | Status: DC

## 2013-11-21 NOTE — Progress Notes (Signed)
Subjective:    Patient ID: Rhonda Thomas, female    DOB: 02/29/1956, 57 y.o.   MRN: [id number]  HPI pt states DM was dx'ed in 1989, during a pregnancy, but she was noted on labs to had DM outside of pregnancy in 2004; she has mild neuropathy of the lower extremities; she is unaware of any associated chronic complications.  he has been on insulin in mid-2014.  pt says his diet and exercise are "pretty good."  She seldom checks cbg's, but when she does, most are in the mid-100's. She says invokana caused yeast vaginitis, so she stopped it.   Past Medical History  Diagnosis Date  . Diabetes   . Anxiety   . Movement disorder   . Neuropathy   . Attention deficit disorder (ADD)     Past Surgical History  Procedure Laterality Date  . Cholecystectomy    . Tubal ligation    . Abdominal hysterectomy    . Toe amputation Right     2nd toe rt foot    History   Social History  . Marital Status: Married    Spouse Name: Fayrene Fearing    Number of Children: 3  . Years of Education: college   Occupational History  .      [employer]   Social History Main Topics  . Smoking status: Never Smoker   . Smokeless tobacco: Never Used  . Alcohol Use: No  . Drug Use: No  . Sexual Activity: Not on file   Other Topics Concern  . Not on file   Social History Narrative   Patient lives at home with her husband. Fayrene Fearing).   Patient works for [employer].    Education- College   Right handed.   Caffeine- tea one cup daily.          Current Outpatient Prescriptions on File Prior to Visit  Medication Sig Dispense Refill  . amphetamine-dextroamphetamine (ADDERALL XR) 20 MG 24 hr capsule Take 1 capsule (20 mg total) by mouth every morning.  30 capsule  0  . Canagliflozin (INVOKANA) 100 MG TABS Take 1 tablet (100 mg total) by mouth every morning.  35 tablet  0  . carbidopa-levodopa (SINEMET IR) 25-100 MG per tablet Take 1 tablet by mouth 3 (three) times daily.  90 tablet  6    . furosemide (LASIX) 20 MG tablet Take one po qd x 2-3 days prn swelling.  30 tablet  3  . glimepiride (AMARYL) 4 MG tablet Take 1 tablet (4 mg total) by mouth 2 (two) times daily before a meal.  30 tablet  11  . glucose blood (ONETOUCH VERIO) test strip Use to check BG up to twice a day (dx: 250.02 uncontrolled type 2 DM)  100 each  6  . HYDROcodone-acetaminophen (NORCO) 10-325 MG per tablet Take 1 tablet by mouth daily.       . insulin glargine (LANTUS) 100 UNIT/ML injection Inject 10 Units into the skin at bedtime.      Marland Kitchen nystatin-triamcinolone ointment (MYCOLOG) Apply topically 2 (two) times daily.  30 g  0  . OXcarbazepine (TRILEPTAL) 150 MG tablet Take 1 tablet (150 mg total) by mouth 2 (two) times daily.  60 tablet  12  . pregabalin (LYRICA) 150 MG capsule Take 1 capsule (150 mg total) by mouth 3 (three) times daily.  90 capsule  6  . PREMARIN 1.25 MG tablet TAKE 1 TABLET BY MOUTH EVERY DAY  30 tablet  0  .  rosuvastatin (CRESTOR) 10 MG tablet Take 1 tablet (10 mg total) by mouth daily.  42 tablet  0  . SitaGLIPtin-MetFORMIN HCl (JANUMET XR) 50-1000 MG TB24 Take 1 tablet by mouth 2 (two) times daily.  60 tablet  11  . zolpidem (AMBIEN) 10 MG tablet 1/2 to 1 whole tab at bedtime as needed for sleep.  30 tablet  0   No current facility-administered medications on file prior to visit.    No Known Allergies  Family History  Problem Relation Age of Onset  . Diabetes    . Heart Problems    . Stroke Mother   DM: both parents  BP 118/70  Pulse 74  Temp(Src) 97.5 F (36.4 C) (Oral)  Ht 5\' 8"  (1.727 m)  Wt 184 lb (83.462 kg)  BMI 27.98 kg/m2  SpO2 97%  Review of Systems denies weight loss, blurry vision, headache, chest pain, sob, n/v, urinary frequency, cramps, excessive diaphoresis, memory loss, depression, rhinorrhea, and easy bruising.  She has chronic pain of the feet, worse at night.  She has menopausal sxs.    Objective:   Physical Exam VS: see vs page GEN: no  distress HEAD: head: no deformity eyes: no periorbital swelling, no proptosis external nose and ears are normal mouth: no lesion seen NECK: supple, thyroid is not enlarged CHEST WALL: no deformity LUNGS: clear to auscultation BREASTS:  No gynecomastia CV: reg rate and rhythm, no murmur ABD: abdomen is soft, nontender.  no hepatosplenomegaly.  not distended.  no hernia MUSCULOSKELETAL: muscle bulk and strength are grossly normal.  no obvious joint swelling.  gait is normal and steady PULSES: no carotid bruit NEURO:  cn 2-12 grossly intact.   readily moves all 4's.  SKIN:  Normal texture and temperature.  No rash or suspicious lesion is visible.   NODES:  None palpable at the neck PSYCH: alert, oriented x3.  Does not appear anxious nor depressed. Lab Results  Component Value Date   HGBA1C 7.6* 11/21/2013      Assessment & Plan:  DM: she needs increased rx.  She may be manageable without insulin.  We discussed the nine oral agents available for type 2 diabetes.  This regimen gives the best risk-benefit ratio.   Anxiety: this often complicates the rx of DM. Foot pain, possibly neuropathic.  This limits exercise rx of DM.

## 2013-11-21 NOTE — Patient Instructions (Addendum)
good diet and exercise habits significanly improve the control of your diabetes.  please let me know if you wish to be referred to a dietician.  high blood sugar is very risky to your health.  you should see an eye doctor every year.  You are at higher than average risk for pneumonia and hepatitis-B.  You should be vaccinated against both.   controlling your blood pressure and cholesterol drastically reduces the damage diabetes does to your body.  this also applies to quitting smoking.  please discuss these with your doctor.  check your blood sugar once a day.  vary the time of day when you check, between before the 3 meals, and at bedtime.  also check if you have symptoms of your blood sugar being too high or too low.  please keep a record of the readings and bring it to your next appointment here.  You can write it on any piece of paper.  please call us sooner if your blood sugar goes below 70, or if you have a lot of readings over 200. blood tests are being requested for you today.  We'll contact you with results. Based on the results, i hope to be able to change the lantus to "bromocriptine."  It has possible side effects of nausea and dizziness.  These go away with time.  You can avoid these by taking it at bedtime, and by taking just take 1/2 pill for the first week.   Please come back for a follow-up appointment in 1 month.

## 2013-11-26 ENCOUNTER — Telehealth: Payer: Self-pay | Admitting: Family Medicine

## 2013-11-26 DIAGNOSIS — G4700 Insomnia, unspecified: Secondary | ICD-10-CM

## 2013-11-26 NOTE — Telephone Encounter (Signed)
This is okay to refill this for her six-month

## 2013-11-28 MED ORDER — ZOLPIDEM TARTRATE 10 MG PO TABS: ORAL_TABLET | ORAL | Status: DC

## 2013-11-28 NOTE — Telephone Encounter (Signed)
Pt aware.

## 2013-12-20 ENCOUNTER — Encounter (HOSPITAL_COMMUNITY): Payer: Self-pay | Admitting: Psychology

## 2013-12-20 NOTE — Progress Notes (Signed)
[name]   DOB: Sep 15, 1956  MR Number: [id number]  Location: [facility] [address] [place] [address] Dept: [phone]  Start: 3 PM End: 4 PM  Provider/Observer:     Edgardo Roys PSYD  Chief Complaint:      Chief Complaint  Patient presents with  . Depression  . Stress    Reason For Service:     I seen the patient is also known to 4 several years. She was initially referred here for difficulty coping with the situation involving her children. Her youngest son is at severe difficulties over the years with regard to mood disorder, substance abuse and behavioral problems. Now there are major stressors associated with him again the patient has been essentially overwhelmed and unsure about what to do about. She reports that this has created a lot of stress both at home and at work. On top of that, her diabetes has gotten to the point that she had hammertoe amputated because of that. She has neuropathy as a result of diabetes as well and has had to stay out of her foot for 8 weeks.  Interventions Strategy:  Cognitive/behavioral psychotherapeutic interventions  Participation Level:   Active  Participation Quality:  Appropriate      Behavioral Observation:  Well Groomed, Alert, and Depressed.   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 There is another individual that has caused problems in the past I tried to manipulate control group such as this to take it upon herself to send an e-mail stating that this other lady would be a chair. The patient reports that she worries about attempts at manipulating the patient and causing trouble but is hoping to be able to adjust to it.  Content of  Session:   Review current symptoms and continued work on building improve coping skills  Current Status:   The patient continues to show improvements as her stressors is in her life have been improving.  Last Reviewed:   11/18/2013  Goals Addressed Today:    Goals addressed included building better coping skills.  Impression/Diagnosis:   The patient has a long history of attention deficit disorder but do to Maj. psychosocial stressors she also developed clinical depression and anxiety. I think that her attentional problems were valid prior to the development of these issues that existed her entire life. However, she is in and repeat if and recurrent trauma with regard primarily to her son and to her daughter to a lesser degree in years past.  Diagnosis:    Axis I:  Major depression, recurrent  Anxiety      Axis II: No diagnosis

## 2013-12-23 ENCOUNTER — Ambulatory Visit (INDEPENDENT_AMBULATORY_CARE_PROVIDER_SITE_OTHER): Payer: BC Managed Care – PPO | Admitting: Nurse Practitioner

## 2013-12-23 VITALS — BP 119/75 | HR 91 | Temp 99.6°F | Ht 69.0 in | Wt 187.0 lb

## 2013-12-23 DIAGNOSIS — B9789 Other viral agents as the cause of diseases classified elsewhere: Secondary | ICD-10-CM

## 2013-12-23 DIAGNOSIS — R509 Fever, unspecified: Secondary | ICD-10-CM

## 2013-12-23 DIAGNOSIS — J069 Acute upper respiratory infection, unspecified: Secondary | ICD-10-CM

## 2013-12-23 LAB — POCT INFLUENZA A/B
Influenza A, POC: NEGATIVE
Influenza B, POC: NEGATIVE

## 2013-12-23 NOTE — Patient Instructions (Signed)

## 2013-12-23 NOTE — Progress Notes (Signed)
   Subjective:    Patient ID: Rhonda Thomas, female    DOB: 07/04/1956, 58 y.o.   MRN: [id number]  HPI  Patient in today with c/o cough and congestion- Low grade fever- started yesterday afternoon- body aches.    Review of Systems  Constitutional: Positive for fever and chills. Negative for appetite change.  HENT: Positive for congestion, postnasal drip, rhinorrhea and sinus pressure. Negative for sore throat and trouble swallowing.   Respiratory: Positive for cough (slight).   Cardiovascular: Negative.        Objective:   Physical Exam  Constitutional: She is oriented to person, place, and time. [name].  HENT:  Right Ear: Hearing, tympanic membrane, external ear and ear canal normal.  Left Ear: Hearing, tympanic membrane, external ear and ear canal normal.  Nose: Mucosal edema and rhinorrhea present. Right sinus exhibits no maxillary sinus tenderness and no frontal sinus tenderness. Left sinus exhibits no maxillary sinus tenderness and no frontal sinus tenderness.  Mouth/Throat: Uvula is midline, oropharynx is clear and moist and mucous membranes are normal.  Cardiovascular: Normal rate, regular rhythm and normal heart sounds.   Pulmonary/Chest: Effort normal and breath sounds normal.  Dry cough  Neurological: She is alert and oriented to person, place, and time.  Skin: Skin is warm and dry.  Psychiatric: She has a normal mood and affect. [name]. Judgment and thought content normal.   BP 119/75  Pulse 91  Temp(Src) 99.6 F (37.6 C) (Oral)  Ht 5\' 9"  (1.753 m)  Wt 187 lb (84.823 kg)  BMI 27.60 kg/m2 Results for orders placed in visit on 12/23/13  POCT INFLUENZA A/B      Result Value Range   Influenza A, POC Negative     Influenza B, POC Negative            Assessment & Plan:   1. Fever, unspecified   2. Viral URI with cough    1. Take meds as prescribed 2. Use a cool mist humidifier especially during the winter  months and when heat has  been humid. 3. Use saline nose sprays frequently 4. Saline irrigations of the nose can be very helpful if done frequently.  * 4X daily for 1 week*  * Use of a nettie pot can be helpful with this. Follow directions with this* 5. Drink plenty of fluids 6. Keep thermostat turn down low 7.For any cough or congestion  Use plain Mucinex- regular strength or max strength is fine   * Children- consult with Pharmacist for dosing 8. For fever or aces or pains- take tylenol or ibuprofen appropriate for age and weight.  * for fevers greater than 101 orally you may alternate ibuprofen and tylenol every  3 hours.   Mary-Margaret Hassell Done, FNP

## 2013-12-24 ENCOUNTER — Ambulatory Visit (INDEPENDENT_AMBULATORY_CARE_PROVIDER_SITE_OTHER): Payer: BC Managed Care – PPO | Admitting: Endocrinology

## 2013-12-24 ENCOUNTER — Encounter: Payer: Self-pay | Admitting: Endocrinology

## 2013-12-24 ENCOUNTER — Ambulatory Visit: Payer: BC Managed Care – PPO | Admitting: Nurse Practitioner

## 2013-12-24 VITALS — BP 110/80 | HR 81 | Temp 98.7°F | Ht 69.0 in | Wt 187.0 lb

## 2013-12-24 DIAGNOSIS — E119 Type 2 diabetes mellitus without complications: Secondary | ICD-10-CM

## 2013-12-24 MED ORDER — INSULIN GLARGINE 100 UNIT/ML SOLOSTAR PEN: 20.0000 [IU] | PEN_INJECTOR | Freq: Every day | SUBCUTANEOUS | Status: DC

## 2013-12-24 NOTE — Progress Notes (Signed)
Subjective:    Patient ID: Rhonda Thomas, female    DOB: [birth year], 58 y.o.   MRN: [id number]  HPI pt returns for f/u of insulin-requiring DM (dx'ed 1989, during a pregnancy, but she was noted on labs to had DM outside of pregnancy in 2004; she has mild neuropathy of the lower extremities; she is unaware of any associated chronic complications; she has been on insulin in mid-2014; she did not tolerate actos (edema) or invokana (vaginitis)).  She seldom checks cbg's, but when she does, most are in the mid-100's.     Past Medical History  Diagnosis Date  . Diabetes   . Anxiety   . Movement disorder   . Neuropathy   . Attention deficit disorder (ADD)     Past Surgical History  Procedure Laterality Date  . Cholecystectomy    . Tubal ligation    . Abdominal hysterectomy    . Toe amputation Right     2nd toe rt foot    History   Social History  . Marital Status: Married    Spouse Name: [name]    Number of Children: 3  . Years of Education: college   Occupational History  .      [employer]   Social History Main Topics  . Smoking status: Never Smoker   . Smokeless tobacco: Never Used  . Alcohol Use: No  . Drug Use: No  . Sexual Activity: Not on file   Other Topics Concern  . Not on file   Social History Narrative   Patient lives at home with her husband. [name]).   Patient works for [employer].    Education- College   Right handed.   Caffeine- tea one cup daily.          Current Outpatient Prescriptions on File Prior to Visit  Medication Sig Dispense Refill  . amphetamine-dextroamphetamine (ADDERALL XR) 20 MG 24 hr capsule Take 1 capsule (20 mg total) by mouth every morning.  30 capsule  0  . carbidopa-levodopa (SINEMET IR) 25-100 MG per tablet Take 1 tablet by mouth 3 (three) times daily.  90 tablet  6  . glucose blood (ONETOUCH VERIO) test strip Use to check BG up to twice a day (dx: 250.02 uncontrolled type 2 DM)  100 each  6  .  HYDROcodone-acetaminophen (NORCO) 10-325 MG per tablet Take 1 tablet by mouth daily.       Marland Kitchen nystatin-triamcinolone ointment (MYCOLOG) Apply topically 2 (two) times daily.  30 g  0  . OXcarbazepine (TRILEPTAL) 150 MG tablet Take 1 tablet (150 mg total) by mouth 2 (two) times daily.  60 tablet  12  . pregabalin (LYRICA) 150 MG capsule Take 1 capsule (150 mg total) by mouth 3 (three) times daily.  90 capsule  6  . PREMARIN 1.25 MG tablet TAKE 1 TABLET BY MOUTH EVERY DAY  30 tablet  0  . rosuvastatin (CRESTOR) 10 MG tablet Take 1 tablet (10 mg total) by mouth daily.  42 tablet  0  . zolpidem (AMBIEN) 10 MG tablet 1/2 to 1 whole tab at bedtime as needed for sleep.  30 tablet  0   No current facility-administered medications on file prior to visit.   Allergies  Allergen Reactions  . Actos [Pioglitazone]     Edema   . Invokana [Canagliflozin]     vaginitis   Family History  Problem Relation Age of Onset  . Diabetes    . Heart Problems    .  Stroke Mother    BP 110/80  Pulse 81  Temp(Src) 98.7 F (37.1 C) (Oral)  Ht 5\' 9"  (1.753 m)  Wt 187 lb (84.823 kg)  BMI 27.60 kg/m2  SpO2 98%  Review of Systems denies hypoglycemia and weight change    Objective:   Physical Exam VITAL SIGNS:  See vs page GENERAL: no distress  Lab Results  Component Value Date   HGBA1C 7.6* 11/21/2013      Assessment & Plan:  DM: she needs insulin. Edema: this precludes actos rx. Vaginitis: this precludes invokana rx.

## 2013-12-24 NOTE — Patient Instructions (Addendum)
Please resume the lantus at 20 units daily.   Stop the diabetes pills (janumet, bromocriptine, and glimepiride), 1 at a time.  Each time, increase the lantus until the blood sugar returns to the 100's.   Please come back for a follow-up appointment in 2-4 weeks.   You will need to take another insulin with meals.

## 2013-12-31 ENCOUNTER — Other Ambulatory Visit: Payer: Self-pay | Admitting: *Deleted

## 2013-12-31 DIAGNOSIS — G4700 Insomnia, unspecified: Secondary | ICD-10-CM

## 2013-12-31 MED ORDER — ZOLPIDEM TARTRATE 10 MG PO TABS: ORAL_TABLET | ORAL | Status: DC

## 2013-12-31 NOTE — Telephone Encounter (Signed)
This is okay x1 

## 2013-12-31 NOTE — Telephone Encounter (Signed)
Called in.

## 2013-12-31 NOTE — Telephone Encounter (Signed)
Last filled 11/28/13, last seen 10/17/13. If approved route to pool A to be called into [facility] [phone]

## 2014-01-07 ENCOUNTER — Telehealth: Payer: Self-pay | Admitting: Family Medicine

## 2014-01-07 DIAGNOSIS — G4700 Insomnia, unspecified: Secondary | ICD-10-CM

## 2014-01-07 MED ORDER — ZOLPIDEM TARTRATE 10 MG PO TABS: ORAL_TABLET | ORAL | Status: DC

## 2014-01-07 NOTE — Telephone Encounter (Signed)
Pt notified to pick up rx.

## 2014-01-14 ENCOUNTER — Ambulatory Visit: Payer: Self-pay | Admitting: Endocrinology

## 2014-01-19 ENCOUNTER — Other Ambulatory Visit: Payer: Self-pay | Admitting: Nurse Practitioner

## 2014-01-21 NOTE — Telephone Encounter (Signed)
Rx signed and faxed.

## 2014-01-22 ENCOUNTER — Ambulatory Visit (INDEPENDENT_AMBULATORY_CARE_PROVIDER_SITE_OTHER): Payer: BC Managed Care – PPO | Admitting: General Practice

## 2014-01-22 VITALS — BP 151/92 | HR 83 | Temp 97.9°F | Wt 188.0 lb

## 2014-01-22 DIAGNOSIS — J0100 Acute maxillary sinusitis, unspecified: Secondary | ICD-10-CM

## 2014-01-22 DIAGNOSIS — J351 Hypertrophy of tonsils: Secondary | ICD-10-CM

## 2014-01-22 LAB — POCT INFLUENZA A/B
Influenza A, POC: NEGATIVE
Influenza B, POC: NEGATIVE

## 2014-01-22 LAB — POCT RAPID STREP A (OFFICE): Rapid Strep A Screen: NEGATIVE

## 2014-01-22 MED ORDER — AZITHROMYCIN 250 MG PO TABS: ORAL_TABLET | ORAL | Status: DC

## 2014-01-22 MED ORDER — MUPIROCIN CALCIUM 2 % NA OINT: 1.0000 "application " | TOPICAL_OINTMENT | Freq: Two times a day (BID) | NASAL | Status: DC

## 2014-01-22 NOTE — Progress Notes (Signed)
   Subjective:    Patient ID: [name], female    DOB: Dec 16, 1955, 58 y.o.   MRN: [id number]  Sinusitis This is a new problem. The current episode started 1 to 4 weeks ago. The problem has been gradually worsening since onset. There has been no fever. Associated symptoms include chills, sinus pressure and a sore throat. Pertinent negatives include no coughing. Past treatments include acetaminophen. The treatment provided mild relief.       Review of Systems  Constitutional: Positive for chills.  HENT: Positive for sinus pressure and sore throat.   Respiratory: Negative for cough and chest tightness.   Cardiovascular: Negative for chest pain and palpitations.  All other systems reviewed and are negative.       Objective:   Physical Exam  Constitutional: She is oriented to person, place, and time. [name].  HENT:  Head: Normocephalic and atraumatic.  Mouth/Throat: Posterior oropharyngeal erythema present.  Cardiovascular: Normal rate, regular rhythm and normal heart sounds.   Pulmonary/Chest: Effort normal and breath sounds normal. No respiratory distress. She exhibits no tenderness.  Neurological: She is alert and oriented to person, place, and time.  Skin: Skin is warm and dry.  Psychiatric: She has a normal mood and affect.      Results for orders placed in visit on 01/22/14  POCT INFLUENZA A/B      Result Value Ref Range   Influenza A, POC Negative     Influenza B, POC Negative    POCT RAPID STREP A (OFFICE)      Result Value Ref Range   Rapid Strep A Screen Negative  Negative       Assessment & Plan:  1. Swollen tonsil  - POCT Influenza A/B - POCT rapid strep A  2. Sinusitis, acute maxillary  - azithromycin (ZITHROMAX) 250 MG tablet; Take as directed  Dispense: 6 tablet; Refill: 0 -RTO if symptoms worsen or unresolved Patient verbalized understanding Erby Pian, FNP-C

## 2014-01-22 NOTE — Patient Instructions (Signed)

## 2014-02-11 ENCOUNTER — Encounter (INDEPENDENT_AMBULATORY_CARE_PROVIDER_SITE_OTHER): Payer: Self-pay

## 2014-02-11 ENCOUNTER — Telehealth: Payer: Self-pay | Admitting: Endocrinology

## 2014-02-11 ENCOUNTER — Encounter: Payer: Self-pay | Admitting: Nurse Practitioner

## 2014-02-11 ENCOUNTER — Ambulatory Visit (INDEPENDENT_AMBULATORY_CARE_PROVIDER_SITE_OTHER): Payer: BC Managed Care – PPO | Admitting: Endocrinology

## 2014-02-11 ENCOUNTER — Encounter: Payer: Self-pay | Admitting: Endocrinology

## 2014-02-11 ENCOUNTER — Ambulatory Visit (INDEPENDENT_AMBULATORY_CARE_PROVIDER_SITE_OTHER): Payer: BC Managed Care – PPO | Admitting: Nurse Practitioner

## 2014-02-11 VITALS — BP 126/82 | HR 76 | Ht 69.0 in | Wt 190.0 lb

## 2014-02-11 VITALS — BP 116/80 | HR 79 | Temp 98.2°F | Ht 69.0 in | Wt 189.0 lb

## 2014-02-11 DIAGNOSIS — E119 Type 2 diabetes mellitus without complications: Secondary | ICD-10-CM

## 2014-02-11 DIAGNOSIS — G2581 Restless legs syndrome: Secondary | ICD-10-CM

## 2014-02-11 DIAGNOSIS — Z79899 Other long term (current) drug therapy: Secondary | ICD-10-CM

## 2014-02-11 DIAGNOSIS — G609 Hereditary and idiopathic neuropathy, unspecified: Secondary | ICD-10-CM

## 2014-02-11 LAB — BASIC METABOLIC PANEL
BUN/Creatinine Ratio: 21 (ref 9–23)
BUN: 13 mg/dL (ref 6–24)
CO2: 32 mmol/L — ABNORMAL HIGH (ref 18–29)
Calcium: 9.4 mg/dL (ref 8.7–10.2)
Chloride: 101 mmol/L (ref 96–108)
Creatinine, Ser: 0.63 mg/dL (ref 0.57–1.00)
GFR calc Af Amer: 115 mL/min/{1.73_m2} (ref >59)
GFR calc non Af Amer: 100 mL/min/{1.73_m2} (ref >59)
Glucose: 185 mg/dL — ABNORMAL HIGH (ref 65–99)
Potassium: 5.1 mmol/L (ref 3.5–5.2)
Sodium: 140 mmol/L (ref 134–144)

## 2014-02-11 MED ORDER — INSULIN NPH (HUMAN) (ISOPHANE) 100 UNIT/ML ~~LOC~~ SUSP: 10.0000 [IU] | Freq: Every day | SUBCUTANEOUS | Status: DC

## 2014-02-11 MED ORDER — CARBIDOPA-LEVODOPA 25-100 MG PO TABS: 1.0000 | ORAL_TABLET | Freq: Three times a day (TID) | ORAL | Status: DC

## 2014-02-11 MED ORDER — OXCARBAZEPINE 150 MG PO TABS: ORAL_TABLET | ORAL | Status: DC

## 2014-02-11 MED ORDER — INSULIN REGULAR HUMAN 100 UNIT/ML IJ SOLN: 5.0000 [IU] | Freq: Three times a day (TID) | INTRAMUSCULAR | Status: DC

## 2014-02-11 NOTE — Progress Notes (Signed)
[facility]  PATIENT: Rhonda Thomas DOB: Feb 29, 1956   REASON FOR VISIT: Followup for paresthesias of the feet, restless leg syndrome   HISTORY OF PRESENT ILLNESS:Ms Laurance Thomas, 58 year old female returns for followup. She was last seen by Dr. Krista Thomas 11/13/13. She is still taking Lyrica 150 mg 3 times a day, which helps her symptoms some, she is also taking Sinemet 3 tablets a day , complains of pulling sensation in her feet.Trileptal was added at last visit which has been helpful for her constant bilateral feet discomfort pain, affecting her ability to work as a first [employer].She is a Pharmacist, hospital and continues to work. Most recent blood sugar 171 and HgB A1C 7.6. She returns for reevaluation     HISTORY:bilateral feet paresthesia and restless leg syndrome.  She also has history of insulin dependent DM for more than 13 years now, around the time of diagnosis, she noticed bilateral plantar feet and toes numbness, tingling, slow progression over the years, now spreading to whole feet. She complains of burning, tingling, some time knife stabbing pain from bottom of her feet upwards, increased after bearing weight.  Her DM was under suboptimal control, A1C 7.8, now on insulin, she is taking lyrica 150mg  bid, only mild improvement, high copay.  She denies bialteral lower extremity weakness, bilateral hands paresthesia, no incontinence.  EEG/Coplay abnormal study. There is electrodiagnostic evidence of mild to moderate length-dependent axonal sensorimotor polyneuropathy. There is also evidence of mild to moderate bilateral carpal tunnel syndromes. There is no evidence of right lumbar sacral radiculopathy.  She had right second toe amputation due to infection, she still has bilateral third toe swelling from recent correction surgery, continues to complain of bilateral feet paresthesias, she has restless leg symptoms, difficulty holding his feet and leg still when trying to sleep, she has tried  Requip, causing worsening symptoms,  She tried the cream ordered for neuropathy but had itching, amitriptyline cause bug crawling sensation in her leg. Requip had made her symptoms worse.    REVIEW OF SYSTEMS: Full 14 system review of systems performed and notable only for those listed, all others are neg:  Constitutional: N/A  Cardiovascular: N/A  Ear/Nose/Throat: N/A  Skin: N/A  Eyes: N/A  Respiratory: N/A  Gastroitestinal: N/A  Hematology/Lymphatic: N/A  Endocrine: N/A Musculoskeletal:N/A  Allergy/Immunology: N/A  Neurological: Numbness in the feet Psychiatric: N/A Sleep restless leg, insomnia   ALLERGIES: Allergies  Allergen Reactions  . Actos [Pioglitazone]     Edema   . Invokana [Canagliflozin]     vaginitis    HOME MEDICATIONS: Outpatient Prescriptions Prior to Visit  Medication Sig Dispense Refill  . azithromycin (ZITHROMAX) 250 MG tablet Take as directed  6 tablet  0  . bromocriptine (PARLODEL) 2.5 MG tablet       . carbidopa-levodopa (SINEMET IR) 25-100 MG per tablet Take 1 tablet by mouth 3 (three) times daily.  90 tablet  6  . glucose blood (ONETOUCH VERIO) test strip Use to check BG up to twice a day (dx: 250.02 uncontrolled type 2 DM)  100 each  6  . HYDROcodone-acetaminophen (NORCO) 10-325 MG per tablet Take 1 tablet by mouth daily.       . Insulin Glargine (LANTUS SOLOSTAR) 100 UNIT/ML Solostar Pen Inject 20 Units into the skin daily. And pen needles 1/day  15 mL  11  . LYRICA 150 MG capsule TAKE 1 CAPSULE BY MOUTH THREE TIMES DAILY --PT HAS DISCOUNT CARD ON FILE  90 capsule  5  .  OXcarbazepine (TRILEPTAL) 150 MG tablet Take 1 tablet (150 mg total) by mouth 2 (two) times daily.  60 tablet  12  . rosuvastatin (CRESTOR) 10 MG tablet Take 1 tablet (10 mg total) by mouth daily.  42 tablet  0  . zolpidem (AMBIEN) 10 MG tablet 1/2 to 1 whole tab at bedtime as needed for sleep.  30 tablet  0  . amphetamine-dextroamphetamine (ADDERALL XR) 20 MG 24 hr capsule Take 1  capsule (20 mg total) by mouth every morning.  30 capsule  0  . PREMARIN 1.25 MG tablet TAKE 1 TABLET BY MOUTH EVERY DAY  30 tablet  0  . mupirocin nasal ointment (BACTROBAN) 2 % Place 1 application into the nose 2 (two) times daily. Use one-half of tube in each nostril twice daily for five (5) days. After application, press sides of nose together and gently massage.  10 g  0  . nystatin-triamcinolone ointment (MYCOLOG) Apply topically 2 (two) times daily.  30 g  0   No facility-administered medications prior to visit.    PAST MEDICAL HISTORY: Past Medical History  Diagnosis Date  . Diabetes   . Anxiety   . Movement disorder   . Neuropathy   . Attention deficit disorder (ADD)     PAST SURGICAL HISTORY: Past Surgical History  Procedure Laterality Date  . Cholecystectomy    . Tubal ligation    . Abdominal hysterectomy    . Toe amputation Right     2nd toe rt foot    FAMILY HISTORY: Family History  Problem Relation Age of Onset  . Diabetes    . Heart Problems    . Stroke Mother     SOCIAL HISTORY: History   Social History  . Marital Status: Married    Spouse Name: Rhonda Thomas    Number of Children: 3  . Years of Education: college   Occupational History  .      [employer]   Social History Main Topics  . Smoking status: Never Smoker   . Smokeless tobacco: Never Used  . Alcohol Use: No  . Drug Use: No  . Sexual Activity: Not on file   Other Topics Concern  . Not on file   Social History Narrative   Patient lives at home with her husband. Rhonda Thomas).   Patient works for [employer].    Education- College   Right handed.   Caffeine- tea one cup daily.           PHYSICAL EXAM  Filed Vitals:   02/11/14 0910  BP: 126/82  Pulse: 76  Height: 5\' 9"  (1.753 m)  Weight: 190 lb (86.183 kg)   Body mass index is 28.05 kg/(m^2).  Generalized: Well developed, in no acute distress  Head: normocephalic and atraumatic,. Oropharynx benign    Neck: Supple, no carotid bruits  Cardiac: Regular rate rhythm, no murmur  Musculoskeletal: Right second toe amputation, no significant peripheral edema Neurological examination   Mentation: Alert oriented to time, place, history taking. Follows all commands speech and language fluent  Cranial nerve II-XII: Pupils were equal round reactive to light extraocular movements were full, visual field were full on confrontational test. Facial sensation and strength were normal. hearing was intact to finger rubbing bilaterally. Uvula tongue midline. head turning and shoulder shrug were normal and symmetric.Tongue protrusion into cheek strength was normal. Motor: normal bulk and tone, full strength in the BUE, BLE, No focal weakness Sensory: Length dependent decreased light touch, pinprick  to upper shin, decreased proprioception and decreased vibratory sensation to the ankle level   Coordination: finger-nose-finger, heel-to-shin bilaterally, no dysmetria Reflexes: Brachioradialis 2/2, biceps 2/2, triceps 2/2, patellar 2/2, Achilles absent plantar responses were flexor bilaterally. Gait and Station: Rising up from seated position without assistance, normal stance,  moderate stride, good arm swing, smooth turning, able to perform tiptoe, and heel walking without difficulty. Tandem gait is steady  DIAGNOSTIC DATA (LABS, IMAGING, TESTING) - I reviewed patient records, labs, notes, testing and imaging myself where available.      Component Value Date/Time   NA 138 11/21/2013 0908   K 4.4 11/21/2013 0908   CL 102 11/21/2013 0908   CO2 27 11/21/2013 0908   GLUCOSE 171* 11/21/2013 0908   BUN 14 11/21/2013 0908   CREATININE 0.7 11/21/2013 0908   CREATININE 0.69 06/21/2013 0931   CALCIUM 8.8 11/21/2013 0908   PROT 6.5 06/21/2013 0931   ALBUMIN 4.1 06/21/2013 0931   AST 12 06/21/2013 0931   ALT 18 06/21/2013 0931   ALKPHOS 55 06/21/2013 0931   BILITOT 0.5 06/21/2013 0931   Lab Results  Component Value Date    LDLCALC 109* 06/21/2013   TRIG 267* 06/21/2013   Lab Results  Component Value Date   HGBA1C 7.6* 11/21/2013       ASSESSMENT AND PLAN  59 y.o. year old female  has a past medical history of Diabetes; length dependent sensory changes consistent with a diabetic peripheral neuropathy , restless leg syndrome  Will check basic metabolic profile to monitor for side effects to Trileptal Increase Trileptal to 1 tablet in the morning and 2 at bedtime, will renew Continue Lyrica at  current dose Continue carbidopa levodopa at current dose will renew  F/U 3 to 4 months Dennie Bible, [facility], [facility], APRN  [facility] [address] [place], [place] [address] [phone]

## 2014-02-11 NOTE — Telephone Encounter (Signed)
Pt called wanting change insulin script to pens. Also, we received a fax stating that the Humulin was not covered by insurance. Covered alternatives would be No Vo-Nordisk brands are preferred. Wanted to check before changing script due to insurance not covering one of the med's. Please advise, Thanks!

## 2014-02-11 NOTE — Patient Instructions (Signed)
Will check basic metabolic profile to monitor for side effects to Trileptal Increase Trileptal to 1 tablet in the morning and 2 at bedtime, will renew Continue Lyrica at  current dose Continue carbidopa levodopa at current dose will renew  F/U 3 to 4 months

## 2014-02-11 NOTE — Progress Notes (Signed)
Subjective:    Patient ID: Rhonda Thomas, female    DOB: 1955/12/14, 58 y.o.   MRN: [id number]  HPI pt returns for f/u of insulin-requiring DM (dx'ed 1989, during a pregnancy, but she was noted on labs to had DM outside of pregnancy in 2004; she has mild neuropathy of the lower extremities; she is unaware of any associated chronic complications; she has been on insulin in mid-2014; she takes multiple daily injections; she has never had severe hypoglycemia or DKA).  no cbg record, but states cbg's are in the mid-100's. It is in general higher as the day goes on.   Past Medical History  Diagnosis Date  . Diabetes   . Anxiety   . Movement disorder   . Neuropathy   . Attention deficit disorder (ADD)     Past Surgical History  Procedure Laterality Date  . Cholecystectomy    . Tubal ligation    . Abdominal hysterectomy    . Toe amputation Right     2nd toe rt foot    History   Social History  . Marital Status: Married    Spouse Name: [name]    Number of Children: 3  . Years of Education: college   Occupational History  .      [employer]   Social History Main Topics  . Smoking status: Never Smoker   . Smokeless tobacco: Never Used  . Alcohol Use: No  . Drug Use: No  . Sexual Activity: Not on file   Other Topics Concern  . Not on file   Social History Narrative   Patient lives at home with her husband. [name]).   Patient works for [employer].    Education- College   Right handed.   Caffeine- tea one cup daily.          Current Outpatient Prescriptions on File Prior to Visit  Medication Sig Dispense Refill  . amphetamine-dextroamphetamine (ADDERALL XR) 20 MG 24 hr capsule Take 20 mg by mouth as needed.      . carbidopa-levodopa (SINEMET IR) 25-100 MG per tablet Take 1 tablet by mouth 3 (three) times daily.  90 tablet  6  . estrogens, conjugated, (PREMARIN) 1.25 MG tablet TAKE 1 TABLET BY MOUTH EVERY DAY,  AS NEEDED      . glucose blood  (ONETOUCH VERIO) test strip Use to check BG up to twice a day (dx: 250.02 uncontrolled type 2 DM)  100 each  6  . HYDROcodone-acetaminophen (NORCO) 10-325 MG per tablet Take 1 tablet by mouth daily.       Marland Kitchen LYRICA 150 MG capsule TAKE 1 CAPSULE BY MOUTH THREE TIMES DAILY --PT HAS DISCOUNT CARD ON FILE  90 capsule  5  . OXcarbazepine (TRILEPTAL) 150 MG tablet 1 in the am 2 caps at hs  90 tablet  4  . rosuvastatin (CRESTOR) 10 MG tablet Take 1 tablet (10 mg total) by mouth daily.  42 tablet  0  . zolpidem (AMBIEN) 10 MG tablet 1/2 to 1 whole tab at bedtime as needed for sleep.  30 tablet  0   No current facility-administered medications on file prior to visit.    Allergies  Allergen Reactions  . Actos [Pioglitazone]     Edema   . Invokana [Canagliflozin]     vaginitis    Family History  Problem Relation Age of Onset  . Diabetes    . Heart Problems    . Stroke Mother  BP 116/80  Pulse 79  Temp(Src) 98.2 F (36.8 C) (Oral)  Ht 5\' 9"  (1.753 m)  Wt 189 lb (85.73 kg)  BMI 27.90 kg/m2  SpO2 99%  Review of Systems She denies hypoglycemia.  She has gained weight.     Objective:   Physical Exam VITAL SIGNS:  See vs page GENERAL: no distress SKIN:  Insulin injection sites at the anterior abdomen are normal.        Assessment & Plan:  DM: she needs increased rx.  This new insulin regimen was chosen from multiple options, as it best matches her insulin to her changing requirements throughout the day.  The benefits of glycemic control must be weighed against the risks of hypoglycemia.   Anxiety: this often complicates the rx of DM.

## 2014-02-11 NOTE — Telephone Encounter (Signed)
Pt would like to change insulin direction from syringe to something like a pen   Call Back:[phone]  Thank You

## 2014-02-11 NOTE — Patient Instructions (Addendum)
Please change the lantus to NPH, 10 units at bedtime. Add regular insulin, 6 units 3 times a day (just before each meal) Please come back for a follow-up appointment in 2 months.  check your blood sugar twice a day.  vary the time of day when you check, between before the 3 meals, and at bedtime.  also check if you have symptoms of your blood sugar being too high or too low.  please keep a record of the readings and bring it to your next appointment here.  You can write it on any piece of paper.  please call us sooner if your blood sugar goes below 70, or if you have a lot of readings over 200.

## 2014-02-11 NOTE — Telephone Encounter (Signed)
Ov is due.  Let's address then 

## 2014-02-12 NOTE — Telephone Encounter (Signed)
Pt was seen on 02/11/2014.  Thanks!

## 2014-02-14 ENCOUNTER — Ambulatory Visit: Payer: BC Managed Care – PPO | Admitting: Nurse Practitioner

## 2014-02-18 ENCOUNTER — Telehealth: Payer: Self-pay | Admitting: Endocrinology

## 2014-02-18 NOTE — Telephone Encounter (Signed)
Pt states that when she picked up her Rx she did not have any needles Also, she explained one of her Rx needs prior auth   Call back: [phone] Pharmacy: [facility]  Thank you:)

## 2014-02-20 NOTE — Telephone Encounter (Signed)
Left message for pt to call back  °

## 2014-02-24 NOTE — Telephone Encounter (Signed)
Ok, do you want pens or vials?

## 2014-02-24 NOTE — Telephone Encounter (Signed)
Fax received from Pharmacy stating that Humulin R is not covered. Preferred in now Novolog.  Please advise, Thanks!

## 2014-02-24 NOTE — Telephone Encounter (Signed)
Requested call back.  

## 2014-02-25 ENCOUNTER — Telehealth: Payer: Self-pay | Admitting: General Practice

## 2014-02-25 NOTE — Telephone Encounter (Signed)
Pt states that she would like pens.

## 2014-02-26 MED ORDER — INSULIN ASPART 100 UNIT/ML FLEXPEN: 5.0000 [IU] | PEN_INJECTOR | Freq: Three times a day (TID) | SUBCUTANEOUS | Status: DC

## 2014-02-26 NOTE — Telephone Encounter (Signed)
Ok, i have sent a prescription to your pharmacy  

## 2014-03-03 ENCOUNTER — Other Ambulatory Visit: Payer: Self-pay | Admitting: General Practice

## 2014-03-03 DIAGNOSIS — G4700 Insomnia, unspecified: Secondary | ICD-10-CM

## 2014-03-03 MED ORDER — ZOLPIDEM TARTRATE 10 MG PO TABS: ORAL_TABLET | ORAL | Status: DC

## 2014-03-10 ENCOUNTER — Ambulatory Visit (INDEPENDENT_AMBULATORY_CARE_PROVIDER_SITE_OTHER): Payer: BC Managed Care – PPO | Admitting: Family Medicine

## 2014-03-10 ENCOUNTER — Encounter: Payer: Self-pay | Admitting: Family Medicine

## 2014-03-10 VITALS — BP 128/83 | HR 78 | Temp 98.0°F | Ht 69.0 in | Wt 191.4 lb

## 2014-03-10 DIAGNOSIS — L0390 Cellulitis, unspecified: Secondary | ICD-10-CM

## 2014-03-10 DIAGNOSIS — L0291 Cutaneous abscess, unspecified: Secondary | ICD-10-CM

## 2014-03-10 MED ORDER — SULFAMETHOXAZOLE-TMP DS 800-160 MG PO TABS: 1.0000 | ORAL_TABLET | Freq: Two times a day (BID) | ORAL | Status: DC

## 2014-03-10 NOTE — Progress Notes (Signed)
   Subjective:    Patient ID: Rhonda Thomas, female    DOB: 17-May-1956, 58 y.o.   MRN: [id number]  HPI  This 58 y.o. female presents for evaluation of left ear pain.  She states she has a boil in her left Ear canal.  She has a cyst on her let cheek.   She states her husband had MRSA and she thinks She may have picked this up.  Review of Systems C/o cyst and cellulitis No chest pain, SOB, HA, dizziness, vision change, N/V, diarrhea, constipation, dysuria, urinary urgency or frequency, myalgias, arthralgias or rash.     Objective:   Physical Exam  Vital signs noted  Well developed well nourished female.  HEENT - Head atraumatic Normocephalic                Eyes - PERRLA, Conjuctiva - clear Sclera- Clear EOMI                Ears - Left EAC decreased and painful and no cyst or pustule seen                Throat - oropharanx wnl                Face - Left cheek with cellulitis and cyst w/o fluctuance Respiratory - Lungs CTA bilateral Cardiac - RRR S1 and S2 without murmur       Assessment & Plan:  Cellulitis - Plan: sulfamethoxazole-trimethoprim (BACTRIM DS) 800-160 MG per tablet Po bid x 10 days  Lysbeth Penner FNP

## 2014-04-14 ENCOUNTER — Ambulatory Visit: Payer: Self-pay | Admitting: Endocrinology

## 2014-04-17 ENCOUNTER — Ambulatory Visit (INDEPENDENT_AMBULATORY_CARE_PROVIDER_SITE_OTHER): Payer: BC Managed Care – PPO | Admitting: Endocrinology

## 2014-04-17 ENCOUNTER — Encounter: Payer: Self-pay | Admitting: Endocrinology

## 2014-04-17 VITALS — BP 122/82 | HR 78 | Temp 97.9°F | Ht 69.0 in | Wt 193.0 lb

## 2014-04-17 DIAGNOSIS — E119 Type 2 diabetes mellitus without complications: Secondary | ICD-10-CM

## 2014-04-17 MED ORDER — INSULIN ASPART PROT & ASPART (70-30 MIX) 100 UNIT/ML PEN: PEN_INJECTOR | SUBCUTANEOUS | Status: DC

## 2014-04-17 NOTE — Progress Notes (Signed)
Subjective:    Patient ID: Rhonda Thomas, female    DOB: [birth year], 58 y.o.   MRN: [id number]  HPI pt returns for f/u of insulin-requiring DM (dx'ed 1989, during a pregnancy, but she was noted on labs to had DM outside of pregnancy in 2004; she has mild neuropathy of the lower extremities; she is unaware of any associated chronic complications; she has been on insulin in mid-2014; she takes multiple daily injections; she has never had severe hypoglycemia or DKA).  Due to constipation, she went back to orals.  no cbg record, but states on the insulin, cbg's varied from 63-200's.  It was in general higher as the day goes on.  She was taking NPH, 18 units qhs, and novolog, 6 units 3 times a day (just before each meal).  She says she cannot take insulin in the middle of the day.  She eats meals on a regular schedule, but lunch is the smallest meal.   Past Medical History  Diagnosis Date  . Diabetes   . Anxiety   . Movement disorder   . Neuropathy   . Attention deficit disorder (ADD)     Past Surgical History  Procedure Laterality Date  . Cholecystectomy    . Tubal ligation    . Abdominal hysterectomy    . Toe amputation Right     2nd toe rt foot    History   Social History  . Marital Status: Married    Spouse Name: [name]    Number of Children: 3  . Years of Education: college   Occupational History  .      [employer]   Social History Main Topics  . Smoking status: Never Smoker   . Smokeless tobacco: Never Used  . Alcohol Use: No  . Drug Use: No  . Sexual Activity: Not on file   Other Topics Concern  . Not on file   Social History Narrative   Patient lives at home with her husband. [name]).   Patient works for [employer].    Education- College   Right handed.   Caffeine- tea one cup daily.          Current Outpatient Prescriptions on File Prior to Visit  Medication Sig Dispense Refill  . amphetamine-dextroamphetamine (ADDERALL XR)  20 MG 24 hr capsule Take 20 mg by mouth as needed.      . carbidopa-levodopa (SINEMET IR) 25-100 MG per tablet Take 1 tablet by mouth 3 (three) times daily.  90 tablet  6  . estrogens, conjugated, (PREMARIN) 1.25 MG tablet TAKE 1 TABLET BY MOUTH EVERY DAY,  AS NEEDED      . glucose blood (ONETOUCH VERIO) test strip Use to check BG up to twice a day (dx: 250.02 uncontrolled type 2 DM)  100 each  6  . HYDROcodone-acetaminophen (NORCO) 10-325 MG per tablet Take 1 tablet by mouth daily.       . insulin aspart (NOVOLOG FLEXPEN) 100 UNIT/ML FlexPen Inject 5 Units into the skin 3 (three) times daily with meals. And pen needles 4/day  15 mL  11  . LYRICA 150 MG capsule TAKE 1 CAPSULE BY MOUTH THREE TIMES DAILY --PT HAS DISCOUNT CARD ON FILE  90 capsule  5  . OXcarbazepine (TRILEPTAL) 150 MG tablet 1 in the am 2 caps at hs  90 tablet  4  . rosuvastatin (CRESTOR) 10 MG tablet Take 1 tablet (10 mg total) by mouth daily.  42 tablet  0  . sulfamethoxazole-trimethoprim (BACTRIM DS) 800-160 MG per tablet Take 1 tablet by mouth 2 (two) times daily.  20 tablet  0  . zolpidem (AMBIEN) 10 MG tablet 1/2 to 1 whole tab at bedtime as needed for sleep.  30 tablet  0   No current facility-administered medications on file prior to visit.    Allergies  Allergen Reactions  . Actos [Pioglitazone]     Edema   . Invokana [Canagliflozin]     vaginitis    Family History  Problem Relation Age of Onset  . Diabetes    . Heart Problems    . Stroke Mother     BP 122/82  Pulse 78  Temp(Src) 97.9 F (36.6 C) (Oral)  Ht 5\' 9"  (1.753 m)  Wt 193 lb (87.544 kg)  BMI 28.49 kg/m2  SpO2 97%   Review of Systems Denies LOC.  She has gained weight.      Objective:   Physical Exam VITAL SIGNS:  See vs page GENERAL: no distress  Lab Results  Component Value Date   HGBA1C 7.6* 11/21/2013      Assessment & Plan:  DM: poor control.  She wants to take insulin just bid. Noncompliance: this is complicating the rx of  DM. Anxiety: this also often complicates the rx of DM.

## 2014-04-17 NOTE — Patient Instructions (Addendum)
Please change the insulin to "novolog 70/30," 18 units, with the first and last meals of the day.  i have sent a prescription to your pharmacy.   Please come back for a follow-up appointment in 1 month.  check your blood sugar twice a day.  vary the time of day when you check, between before the 3 meals, and at bedtime.  also check if you have symptoms of your blood sugar being too high or too low.  please keep a record of the readings and bring it to your next appointment here.  You can write it on any piece of paper.  please call us sooner if your blood sugar goes below 70, or if you have a lot of readings over 200.

## 2014-04-23 ENCOUNTER — Encounter: Payer: Self-pay | Admitting: Pharmacist

## 2014-04-23 ENCOUNTER — Telehealth: Payer: Self-pay | Admitting: Pharmacist

## 2014-04-23 ENCOUNTER — Ambulatory Visit (INDEPENDENT_AMBULATORY_CARE_PROVIDER_SITE_OTHER): Payer: BC Managed Care – PPO | Admitting: Pharmacist

## 2014-04-23 VITALS — BP 122/70 | HR 77 | Ht 69.0 in | Wt 194.0 lb

## 2014-04-23 DIAGNOSIS — E119 Type 2 diabetes mellitus without complications: Secondary | ICD-10-CM

## 2014-04-23 LAB — GLUCOSE, POCT (MANUAL RESULT ENTRY): POC Glucose: 110 mg/dl — AB (ref 70–99)

## 2014-04-23 LAB — POCT GLYCOSYLATED HEMOGLOBIN (HGB A1C): Hemoglobin A1C: 6.6

## 2014-04-23 LAB — POCT HEMOGLOBIN: Hemoglobin: 13.8 g/dL (ref 12.2–16.2)

## 2014-04-23 MED ORDER — DULAGLUTIDE 0.75 MG/0.5ML ~~LOC~~ SOAJ: 0.7500 mg | SUBCUTANEOUS | Status: DC

## 2014-04-23 NOTE — Progress Notes (Signed)
Diabetes Follow-Up Visit Chief Complaint:  No chief complaint on file.    Filed Vitals:   08/14/13 1550  BP: 120/70     HPI: patient with long standing type 2 DM.  I last saw her 08/2013.  She has been seeing Dr Renato Shin and her last visit with him was 04/17/2014.  She has not started Novolog 70/30 insulin that he recommend at that last visit.   Patient has a very significant history of neuropathy and her neurologist has stressed the need to get BG controlled to prevent continued worsening of this condition.  Current Diabetes Medications:  Janumet XR 50/1000mg  1 tablet BID, Lantus 20 units at bedtime, amaryl 4mg  1 tablet BID In past tried Invokana but caused multiple episodes of vaginitis  Exam Edema:  trace  Polyuria:  negative  Polydipsia:  negative Polyphagia:  negatvie  BMI:  Body mass index is 29.83 kg/(m^2).   Weight changes:  increasing General Appearance:  alert, oriented, no acute distress and well nourished Mood/Affect:  normal   Low fat/carbohydrate diet?  Yes Nicotine Abuse?  No Medication Compliance?  No Exercise?  No Alcohol Abuse?  No  Home BG Monitoring:  Checking 3-4 times a day.    Lab Results  Component Value Date   HGBA1C 7.0% 06/21/2013    Lab Results  Component Value Date   MICROALBUR 0.50 06/21/2013    Lab Results  Component Value Date   LDLCALC 109* 06/21/2013   TRIG 267* 06/21/2013    A1c was 6.6% today  Assessment: 1.  Diabetes.  A1c at goal but HBG readings still variable 2.  Blood Pressure.  good 3.  Lipids.  Elevated LDL-P and Tg 4.  Weight gain   Recommendations: 1.  Medication recommendations at this time are as follows:    Continue current medications   Add Trulicity 0.75mg /0.66ml  Inject SQ once weekly.  Reviewed how to inject and first injection given in office.  Reviewed s/s of hypoglucemia - patient to call if BG less than 70 more than 1 time per week for medication adjustment. 2.  Reviewed HBG goals:  Fasting 80-130 and  1-2 hour post prandial <180.  Patient is instructed to check BG 3-4 times per day.    3.  BP goal < 140/80. 4.  LDL goal of < 100, HDL > 40 and TG < 150. 5.  Eye Exam yearly and Dental Exam every 6 months. 6.  Physical Activity recommendations:  30 min daily  Return to clinic in 3 wks   Time spent counseling patient:  40 minutes  Cherre Robins, PharmD, CPP

## 2014-04-25 ENCOUNTER — Other Ambulatory Visit: Payer: Self-pay | Admitting: Family Medicine

## 2014-04-25 NOTE — Telephone Encounter (Signed)
She will need to come in and get established with a provider for her insomnia

## 2014-04-26 NOTE — Telephone Encounter (Signed)
Pt aware that she will need an appt to discuss

## 2014-04-28 ENCOUNTER — Encounter: Payer: Self-pay | Admitting: Family Medicine

## 2014-04-28 ENCOUNTER — Ambulatory Visit (INDEPENDENT_AMBULATORY_CARE_PROVIDER_SITE_OTHER): Payer: BC Managed Care – PPO | Admitting: Family Medicine

## 2014-04-28 VITALS — BP 132/77 | HR 97 | Temp 98.7°F | Ht 69.0 in | Wt 192.0 lb

## 2014-04-28 DIAGNOSIS — G4700 Insomnia, unspecified: Secondary | ICD-10-CM

## 2014-04-28 MED ORDER — ZOLPIDEM TARTRATE 10 MG PO TABS: ORAL_TABLET | ORAL | Status: DC

## 2014-04-28 NOTE — Progress Notes (Signed)
   Subjective:    Patient ID: [name], female    DOB: [birth year], 58 y.o.   MRN: [id number]  HPI This 58 y.o. female presents for evaluation of insomnia.  She is having difficulty falling asleep Due to pain in her lower extremities. She is seeing pain management for peripheral neuropathy Dr. Irving Shows.    Review of Systems    No chest pain, SOB, HA, dizziness, vision change, N/V, diarrhea, constipation, dysuria, urinary urgency or frequency, myalgias, arthralgias or rash.  Objective:   Physical Exam Vital signs noted  Well developed well nourished female.  HEENT - Head atraumatic Normocephalic                Eyes - PERRLA, Conjuctiva - clear Sclera- Clear EOMI                Ears - EAC's Wnl TM's Wnl Gross Hearing WNL                Throat - oropharanx wnl Respiratory - Lungs CTA bilateral Cardiac - RRR S1 and S2 without murmur GI - Abdomen soft Nontender and bowel sounds active x 4        Assessment & Plan:  Insomnia - Plan: zolpidem (AMBIEN) 10 MG tablet, DISCONTINUED: zolpidem (AMBIEN) 10 MG tablet Filled 30 days of ambien for patient and told her that she needs to have pain management rx her insomnia medicine since her underlying problems with insomnia are due to peripheral neuropathy and pain issues.  Lysbeth Penner FNP

## 2014-05-14 ENCOUNTER — Ambulatory Visit (INDEPENDENT_AMBULATORY_CARE_PROVIDER_SITE_OTHER): Payer: BC Managed Care – PPO | Admitting: Pharmacist

## 2014-05-14 ENCOUNTER — Encounter: Payer: Self-pay | Admitting: Pharmacist

## 2014-05-14 VITALS — BP 130/74 | HR 76 | Ht 69.0 in | Wt 192.0 lb

## 2014-05-14 DIAGNOSIS — E785 Hyperlipidemia, unspecified: Secondary | ICD-10-CM

## 2014-05-14 DIAGNOSIS — E1140 Type 2 diabetes mellitus with diabetic neuropathy, unspecified: Secondary | ICD-10-CM

## 2014-05-14 DIAGNOSIS — E1149 Type 2 diabetes mellitus with other diabetic neurological complication: Secondary | ICD-10-CM

## 2014-05-14 MED ORDER — INSULIN ASPART 100 UNIT/ML FLEXPEN: 3.0000 [IU] | PEN_INJECTOR | Freq: Three times a day (TID) | SUBCUTANEOUS | Status: DC

## 2014-05-14 MED ORDER — METFORMIN HCL 1000 MG PO TABS: 1000.0000 mg | ORAL_TABLET | Freq: Two times a day (BID) | ORAL | Status: DC

## 2014-05-14 MED ORDER — DULAGLUTIDE 1.5 MG/0.5ML ~~LOC~~ SOAJ: 1.5000 mg | SUBCUTANEOUS | Status: DC

## 2014-05-14 NOTE — Progress Notes (Signed)
Diabetes Follow-Up Visit Chief Complaint:  No chief complaint on file.    Filed Vitals:   08/14/13 1550  BP: 120/70     HPI: patient with long standing type 2 DM.  I last saw her about 1 month ago. Patient has a very significant history of neuropathy and her neurologist has stressed the need to get BG controlled to prevent continued worsening of this condition. Since starting Trulicity she report improved BG and has decreased Lantus from 20 to 15 units daily.   Current Diabetes Medications:  Janumet XR 50/1000mg  1 tablet BID, Lantus 15 units at bedtime, amaryl 4mg  1 tablet BID, Trulicity 0.75mg  q week, Novolog 3 to 5 units tid In past tried Invokana but caused multiple episodes of vaginitis  Exam Edema:  trace  Polyuria:  negative  Polydipsia:  negative Polyphagia:  negatvie  BMI:  Body mass index is 29.83 kg/(m^2).   Weight changes:  stable General Appearance:  alert, oriented, no acute distress and well nourished Mood/Affect:  normal   Low fat/carbohydrate diet?  Yes - portions have decreased Nicotine Abuse?  No Medication Compliance?  No Exercise?  No Alcohol Abuse?  No  Home BG Monitoring:  Checking 1-2  times a day. Per patient readings have been 140's - 150's - did not bring in glucometer She does report some lightheadedness / hypoglycemia in afternoons - this has improved since she decreased Lantus to 15 units   Lab Results  Component Value Date   HGBA1C 6.6% 04/23/2014    Lab Results  Component Value Date   MICROALBUR 0.50 06/21/2013    Lab Results  Component Value Date   LDLCALC 109* 06/21/2013   TRIG 267* 06/21/2013      Assessment: 1.  Diabetes.  A1c at goal but HBG readings still variable - improved since starting Trulicity 2.  Blood Pressure.  good 3.  Lipids.  Elevated LDL-P and Tg - due recheck but not fasting today =   Recommendations: 1.  Medication recommendations at this time are as follows:    Discontinue Janumet  Start Metformin 1000mg  1  tablet bid with food  Increase Trulicity to 1.5mg /0.66ml  Inject SQ once weekly.  Patient given #2 samples and Rx  Reviewed s/s of hypoglucemia - patient to call if BG less than 70 more than 1 time per week for medication adjustment. 2.  Reviewed HBG goals:  Fasting 80-130 and 1-2 hour post prandial <180.  Patient is instructed to check BG 3-4 times per day.    3.  BP goal < 140/80. 4.  LDL goal of < 100, HDL > 40 and TG < 150. 5.  Eye Exam yearly and Dental Exam every 6 months. 6.  Physical Activity recommendations:  30 min daily  Return to clinic in 6 wks   Time spent counseling patient:  30 minutes  Cherre Robins, PharmD, CPP

## 2014-05-19 ENCOUNTER — Ambulatory Visit: Payer: Self-pay | Admitting: Endocrinology

## 2014-06-16 ENCOUNTER — Ambulatory Visit (INDEPENDENT_AMBULATORY_CARE_PROVIDER_SITE_OTHER): Payer: BC Managed Care – PPO | Admitting: Nurse Practitioner

## 2014-06-16 ENCOUNTER — Encounter: Payer: Self-pay | Admitting: Nurse Practitioner

## 2014-06-16 VITALS — BP 139/89 | HR 79 | Ht 68.25 in | Wt 193.0 lb

## 2014-06-16 DIAGNOSIS — E1140 Type 2 diabetes mellitus with diabetic neuropathy, unspecified: Secondary | ICD-10-CM

## 2014-06-16 DIAGNOSIS — Z5181 Encounter for therapeutic drug level monitoring: Secondary | ICD-10-CM

## 2014-06-16 DIAGNOSIS — E1149 Type 2 diabetes mellitus with other diabetic neurological complication: Secondary | ICD-10-CM

## 2014-06-16 DIAGNOSIS — G609 Hereditary and idiopathic neuropathy, unspecified: Secondary | ICD-10-CM

## 2014-06-16 DIAGNOSIS — E1142 Type 2 diabetes mellitus with diabetic polyneuropathy: Secondary | ICD-10-CM

## 2014-06-16 DIAGNOSIS — G2581 Restless legs syndrome: Secondary | ICD-10-CM

## 2014-06-16 MED ORDER — PREGABALIN 150 MG PO CAPS: 150.0000 mg | ORAL_CAPSULE | Freq: Three times a day (TID) | ORAL | Status: DC

## 2014-06-16 MED ORDER — OXCARBAZEPINE 150 MG PO TABS: ORAL_TABLET | ORAL | Status: DC
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
## 2014-06-16 NOTE — Patient Instructions (Signed)
Will check labs today Continue Trileptal Lyrica and Sinemet at current doses Will refill Followup in 6 months

## 2014-06-18 LAB — CBC WITH DIFFERENTIAL/PLATELET
Basophils Absolute: 0 10*3/uL (ref 0.0–0.2)
Basos: 0 %
Eos: 4 %
Eosinophils Absolute: 0.3 10*3/uL (ref 0.0–0.4)
HCT: 39.2 % (ref 34.0–46.6)
Hemoglobin: 12.8 g/dL (ref 11.1–15.9)
Immature Grans (Abs): 0 10*3/uL (ref 0.0–0.1)
Immature Granulocytes: 0 %
Lymphocytes Absolute: 2 10*3/uL (ref 0.7–3.1)
Lymphs: 29 %
MCH: 29.2 pg (ref 26.6–33.0)
MCHC: 32.7 g/dL (ref 31.5–35.7)
MCV: 90 fL (ref 79–97)
Monocytes Absolute: 0.5 10*3/uL (ref 0.1–0.9)
Monocytes: 7 %
Neutrophils Absolute: 4.2 10*3/uL (ref 1.4–7.0)
Neutrophils Relative %: 60 %
RBC: 4.38 x10E6/uL (ref 3.77–5.28)
RDW: 13.8 % (ref 12.3–15.4)
WBC: 7 10*3/uL (ref 3.4–10.8)

## 2014-06-18 LAB — 10-HYDROXYCARBAZEPINE: Oxcarbazepine SerPl-Mcnc: 2 ug/mL — ABNORMAL LOW (ref 10–35)

## 2014-06-18 NOTE — Progress Notes (Signed)
Quick Note:  Shared labs with patient and she verbalized understanding ______

## 2014-07-04 ENCOUNTER — Ambulatory Visit (INDEPENDENT_AMBULATORY_CARE_PROVIDER_SITE_OTHER): Payer: BC Managed Care – PPO | Admitting: Psychology

## 2014-07-04 DIAGNOSIS — F411 Generalized anxiety disorder: Secondary | ICD-10-CM

## 2014-07-04 DIAGNOSIS — F419 Anxiety disorder, unspecified: Secondary | ICD-10-CM

## 2014-07-04 DIAGNOSIS — F332 Major depressive disorder, recurrent severe without psychotic features: Secondary | ICD-10-CM

## 2014-07-08 ENCOUNTER — Telehealth: Payer: Self-pay | Admitting: Family Medicine

## 2014-07-08 NOTE — Telephone Encounter (Signed)
Ambien last filled 04/28/14, also seen this day, needs refill. Route to pool to be called into [facility]. If refused, still route

## 2014-07-09 ENCOUNTER — Other Ambulatory Visit: Payer: Self-pay | Admitting: Family Medicine

## 2014-07-09 ENCOUNTER — Encounter (HOSPITAL_COMMUNITY): Payer: Self-pay | Admitting: Psychology

## 2014-07-09 DIAGNOSIS — G4700 Insomnia, unspecified: Secondary | ICD-10-CM

## 2014-07-09 MED ORDER — ZOLPIDEM TARTRATE 10 MG PO TABS: ORAL_TABLET | ORAL | Status: DC
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
## 2014-07-15 ENCOUNTER — Other Ambulatory Visit: Payer: Self-pay | Admitting: Family Medicine

## 2014-07-15 DIAGNOSIS — G4700 Insomnia, unspecified: Secondary | ICD-10-CM

## 2014-07-15 MED ORDER — ZOLPIDEM TARTRATE 10 MG PO TABS: ORAL_TABLET | ORAL | Status: DC

## 2014-07-18 ENCOUNTER — Encounter: Payer: Self-pay | Admitting: Nurse Practitioner

## 2014-07-24 ENCOUNTER — Ambulatory Visit: Payer: BC Managed Care – PPO

## 2014-07-25 ENCOUNTER — Other Ambulatory Visit: Payer: Self-pay | Admitting: Family Medicine

## 2014-08-09 ENCOUNTER — Ambulatory Visit (INDEPENDENT_AMBULATORY_CARE_PROVIDER_SITE_OTHER): Payer: BC Managed Care – PPO | Admitting: General Practice

## 2014-08-09 ENCOUNTER — Encounter: Payer: Self-pay | Admitting: General Practice

## 2014-08-09 VITALS — BP 117/71 | HR 75 | Temp 98.0°F | Ht 68.25 in | Wt 189.8 lb

## 2014-08-09 DIAGNOSIS — B3731 Acute candidiasis of vulva and vagina: Secondary | ICD-10-CM

## 2014-08-09 DIAGNOSIS — N390 Urinary tract infection, site not specified: Secondary | ICD-10-CM

## 2014-08-09 DIAGNOSIS — B373 Candidiasis of vulva and vagina: Secondary | ICD-10-CM

## 2014-08-09 LAB — POCT URINALYSIS DIPSTICK
Blood, UA: NEGATIVE
Glucose, UA: NEGATIVE
Ketones, UA: NEGATIVE
Leukocytes, UA: NEGATIVE
Nitrite, UA: NEGATIVE
Spec Grav, UA: 1.025
Urobilinogen, UA: 0.2
pH, UA: 6

## 2014-08-09 MED ORDER — NITROFURANTOIN MONOHYD MACRO 100 MG PO CAPS: 100.0000 mg | ORAL_CAPSULE | Freq: Two times a day (BID) | ORAL | Status: DC

## 2014-08-09 MED ORDER — FLUCONAZOLE 150 MG PO TABS: 150.0000 mg | ORAL_TABLET | Freq: Once | ORAL | Status: DC

## 2014-08-09 NOTE — Progress Notes (Signed)
   Subjective:    Patient ID: Rhonda Thomas, female    DOB: [birth year], 58 y.o.   MRN: [id number]  Urinary Tract Infection  This is a new problem. The current episode started in the past 7 days. The problem occurs every urination. The problem has been gradually worsening. The quality of the pain is described as burning and aching. The pain is at a severity of 4/10. There has been no fever. Associated symptoms include hesitancy. Pertinent negatives include no chills, flank pain, frequency, hematuria, nausea or urgency. She has tried nothing for the symptoms. Her past medical history is significant for recurrent UTIs.  Reports whitish colored vaginal discharge that also began 4-5 days ago. Rhonda Thomas reports recently completing an antibiotic for skin infection.      Review of Systems  Constitutional: Negative for fever and chills.  Respiratory: Negative for chest tightness and shortness of breath.   Cardiovascular: Negative for chest pain and palpitations.  Gastrointestinal: Positive for abdominal pain. Negative for nausea, diarrhea, constipation and blood in stool.  Genitourinary: Positive for dysuria and hesitancy. Negative for urgency, frequency, hematuria and flank pain.  Neurological: Negative for dizziness, weakness and headaches.       Objective:   Physical Exam  Constitutional: She is oriented to person, place, and time. [name].  Cardiovascular: Normal rate, regular rhythm and normal heart sounds.   Pulmonary/Chest: Effort normal and breath sounds normal.  Abdominal: Soft. Bowel sounds are normal. She exhibits no distension. There is tenderness in the suprapubic area. There is no CVA tenderness.  Neurological: She is alert and oriented to person, place, and time.  Skin: Skin is warm and dry.  Psychiatric: She has a normal mood and affect.   Results for orders placed in visit on 08/09/14  POCT URINALYSIS DIPSTICK      Result Value Ref Range   Color, UA yellow     Clarity, UA cloudy     Glucose, UA neg     Bilirubin, UA small     Ketones, UA neg     Spec Grav, UA 1.025     Blood, UA neg     pH, UA 6.0     Protein, UA 30+     Urobilinogen, UA 0.2     Nitrite, UA neg     Leukocytes, UA Negative            Assessment & Plan:  1. Urinary tract infection, site not specified - POCT urinalysis dipstick - nitrofurantoin, macrocrystal-monohydrate, (MACROBID) 100 MG capsule; Take 1 capsule (100 mg total) by mouth 2 (two) times daily.  Dispense: 20 capsule; Refill: 0  2. Vulvovaginal candidiasis - fluconazole (DIFLUCAN) 150 MG tablet; Take 1 tablet (150 mg total) by mouth once. May repeat in 3 days  Dispense: 2 tablet; Refill: 0  Increase fluid intake AZO over the counter X2 days Frequent voiding Proper perineal hygiene RTO if symptoms worsen or unresolved. Seek emergency medical attention if unable to void in 6 hours. Patient verbalized understanding Erby Pian, FNP-C

## 2014-08-09 NOTE — Patient Instructions (Signed)
Urinary Tract Infection Urinary tract infections (UTIs) can develop anywhere along your urinary tract. Your urinary tract is your body's drainage system for removing wastes and extra water. Your urinary tract includes two kidneys, two ureters, a bladder, and a urethra. Your kidneys are a pair of bean-shaped organs. Each kidney is about the size of your fist. They are located below your ribs, one on each side of your spine. CAUSES Infections are caused by microbes, which are microscopic organisms, including fungi, viruses, and bacteria. These organisms are so small that they can only be seen through a microscope. Bacteria are the microbes that most commonly cause UTIs. SYMPTOMS  Symptoms of UTIs may vary by age and gender of the patient and by the location of the infection. Symptoms in young women typically include a frequent and intense urge to urinate and a painful, burning feeling in the bladder or urethra during urination. Older women and men are more likely to be tired, shaky, and weak and have muscle aches and abdominal pain. A fever may mean the infection is in your kidneys. Other symptoms of a kidney infection include pain in your back or sides below the ribs, nausea, and vomiting. DIAGNOSIS To diagnose a UTI, your caregiver will ask you about your symptoms. Your caregiver also will ask to provide a urine sample. The urine sample will be tested for bacteria and white blood cells. White blood cells are made by your body to help fight infection. TREATMENT  Typically, UTIs can be treated with medication. Because most UTIs are caused by a bacterial infection, they usually can be treated with the use of antibiotics. The choice of antibiotic and length of treatment depend on your symptoms and the type of bacteria causing your infection. HOME CARE INSTRUCTIONS  If you were prescribed antibiotics, take them exactly as your caregiver instructs you. Finish the medication even if you feel better after you  have only taken some of the medication.  Drink enough water and fluids to keep your urine clear or pale yellow.  Avoid caffeine, tea, and carbonated beverages. They tend to irritate your bladder.  Empty your bladder often. Avoid holding urine for long periods of time.  Empty your bladder before and after sexual intercourse.  After a bowel movement, women should cleanse from front to back. Use each tissue only once. SEEK MEDICAL CARE IF:   You have back pain.  You develop a fever.  Your symptoms do not begin to resolve within 3 days. SEEK IMMEDIATE MEDICAL CARE IF:   You have severe back pain or lower abdominal pain.  You develop chills.  You have nausea or vomiting.  You have continued burning or discomfort with urination. MAKE SURE YOU:   Understand these instructions.  Will watch your condition.  Will get help right away if you are not doing well or get worse. Document Released: 09/07/2005 Document Revised: 05/29/2012 Document Reviewed: 01/06/2012 [facility] Patient Information 2015 [place], [place]. This information is not intended to replace advice given to you by your health care provider. Make sure you discuss any questions you have with your health care provider.  Candida Infection A Candida infection (also called yeast, fungus, and Monilia infection) is an overgrowth of yeast that can occur anywhere on the body. A yeast infection commonly occurs in warm, moist body areas. Usually, the infection remains localized but can spread to become a systemic infection. A yeast infection may be a sign of a more severe disease such as diabetes, leukemia, or AIDS. A  yeast infection can occur in both men and women. In women, Candida vaginitis is a vaginal infection. It is one of the most common causes of vaginitis. Men usually do not have symptoms or know they have an infection until other problems develop. Men may find out they have a yeast infection because their sex partner has a  yeast infection. Uncircumcised men are more likely to get a yeast infection than circumcised men. This is because the uncircumcised glans is not exposed to air and does not remain as dry as that of a circumcised glans. Older adults may develop yeast infections around dentures. CAUSES  Women  Antibiotics.  Steroid medication taken for a long time.  Being overweight (obese).  Diabetes.  Poor immune condition.  Certain serious medical conditions.  Immune suppressive medications for organ transplant patients.  Chemotherapy.  Pregnancy.  Menstruation.  Stress and fatigue.  Intravenous drug use.  Oral contraceptives.  Wearing tight-fitting clothes in the crotch area.  Catching it from a sex partner who has a yeast infection.  Spermicide.  Intravenous, urinary, or other catheters. Men  Catching it from a sex partner who has a yeast infection.  Having oral or anal sex with a person who has the infection.  Spermicide.  Diabetes.  Antibiotics.  Poor immune system.  Medications that suppress the immune system.  Intravenous drug use.  Intravenous, urinary, or other catheters. SYMPTOMS  Women  Thick, white vaginal discharge.  Vaginal itching.  Redness and swelling in and around the vagina.  Irritation of the lips of the vagina and perineum.  Blisters on the vaginal lips and perineum.  Painful sexual intercourse.  Low blood sugar (hypoglycemia).  Painful urination.  Bladder infections.  Intestinal problems such as constipation, indigestion, bad breath, bloating, increase in gas, diarrhea, or loose stools. Men  Men may develop intestinal problems such as constipation, indigestion, bad breath, bloating, increase in gas, diarrhea, or loose stools.  Dry, cracked skin on the penis with itching or discomfort.  Jock itch.  Dry, flaky skin.  Athlete's foot.  Hypoglycemia. DIAGNOSIS  Women  A history and an exam are performed.  The discharge  may be examined under a microscope.  A culture may be taken of the discharge. Men  A history and an exam are performed.  Any discharge from the penis or areas of cracked skin will be looked at under the microscope and cultured.  Stool samples may be cultured. TREATMENT  Women  Vaginal antifungal suppositories and creams.  Medicated creams to decrease irritation and itching on the outside of the vagina.  Warm compresses to the perineal area to decrease swelling and discomfort.  Oral antifungal medications.  Medicated vaginal suppositories or cream for repeated or recurrent infections.  Wash and dry the irritation areas before applying the cream.  Eating yogurt with Lactobacillus may help with prevention and treatment.  Sometimes painting the vagina with gentian violet solution may help if creams and suppositories do not work. Men  Antifungal creams and oral antifungal medications.  Sometimes treatment must continue for 30 days after the symptoms go away to prevent recurrence. HOME CARE INSTRUCTIONS  Women  Use cotton underwear and avoid tight-fitting clothing.  Avoid colored, scented toilet paper and deodorant tampons or pads.  Do not douche.  Keep your diabetes under control.  Finish all the prescribed medications.  Keep your skin clean and dry.  Consume milk or yogurt with Lactobacillus-active culture regularly. If you get frequent yeast infections and think that is what the  infection is, there are over-the-counter medications that you can get. If the infection does not show healing in 3 days, talk to your caregiver.  Tell your sex partner you have a yeast infection. Your partner may need treatment also, especially if your infection does not clear up or recurs. Men  Keep your skin clean and dry.  Keep your diabetes under control.  Finish all prescribed medications.  Tell your sex partner that you have a yeast infection so he or she can be treated if  necessary. SEEK MEDICAL CARE IF:   Your symptoms do not clear up or worsen in one week after treatment.  You have an oral temperature above 102 F (38.9 C).  You have trouble swallowing or eating for a prolonged time.  You develop blisters on and around your vagina.  You develop vaginal bleeding and it is not your menstrual period.  You develop abdominal pain.  You develop intestinal problems as mentioned above.  You get weak or light-headed.  You have painful or increased urination.  You have pain during sexual intercourse. MAKE SURE YOU:   Understand these instructions.  Will watch your condition.  Will get help right away if you are not doing well or get worse. Document Released: 01/05/2005 Document Revised: 04/14/2014 Document Reviewed: 04/19/2010 [facility] Patient Information 2015 [place], [place]. This information is not intended to replace advice given to you by your health care provider. Make sure you discuss any questions you have with your health care provider.

## 2014-08-13 ENCOUNTER — Encounter: Payer: Self-pay | Admitting: Family Medicine

## 2014-08-13 ENCOUNTER — Ambulatory Visit (INDEPENDENT_AMBULATORY_CARE_PROVIDER_SITE_OTHER): Payer: BC Managed Care – PPO | Admitting: Family Medicine

## 2014-08-13 ENCOUNTER — Telehealth: Payer: Self-pay | Admitting: Nurse Practitioner

## 2014-08-13 VITALS — BP 137/79 | HR 89 | Temp 98.0°F | Wt 192.0 lb

## 2014-08-13 DIAGNOSIS — J0110 Acute frontal sinusitis, unspecified: Secondary | ICD-10-CM

## 2014-08-13 MED ORDER — METHYLPREDNISOLONE (PAK) 4 MG PO TABS: ORAL_TABLET | ORAL | Status: DC

## 2014-08-13 MED ORDER — LEVOFLOXACIN 500 MG PO TABS: 500.0000 mg | ORAL_TABLET | Freq: Every day | ORAL | Status: DC

## 2014-08-13 MED ORDER — ESTROGENS CONJUGATED 1.25 MG PO TABS: 1.2500 mg | ORAL_TABLET | Freq: Every day | ORAL | Status: DC

## 2014-08-13 MED ORDER — FLUCONAZOLE 150 MG PO TABS: 150.0000 mg | ORAL_TABLET | Freq: Once | ORAL | Status: DC

## 2014-08-13 NOTE — Progress Notes (Signed)
   Subjective:    Patient ID: Rhonda Thomas, female    DOB: [birth year], 57 y.o.   MRN: [id number]  HPI C/o sinus discomfort headache, malaise, achy, and discomfort in her maxillary sinuses for the  Last 2 weeks.  She is starting to have malaise for the last few days   Review of Systems    No chest pain, SOB, HA, dizziness, vision change, N/V, diarrhea, constipation, dysuria, urinary urgency or frequency, myalgias, arthralgias or rash.  Objective:   Physical Exam  Vital signs noted  Well developed well nourished female.  HEENT - Head atraumatic Normocephalic                Eyes - PERRLA, Conjuctiva - clear Sclera- Clear EOMI                Ears - EAC's Wnl TM's Wnl Gross Hearing WNL                Nose - Nares patent                 Throat - oropharanx wnl Respiratory - Lungs CTA bilateral Cardiac - RRR S1 and S2 without murmur GI - Abdomen soft Nontender and bowel sounds active x 4 Extremities - No edema. Neuro - Grossly intact.      Assessment & Plan:  Subacute frontal sinusitis - Plan: levofloxacin (LEVAQUIN) 500 MG tablet, fluconazole (DIFLUCAN) 150 MG tablet, methylPREDNIsolone (MEDROL DOSPACK) 4 MG tablet  Push po fluids, rest, tylenol and motrin otc prn as directed for fever, arthralgias, and myalgias.  Follow up prn if sx's continue or persist.  Lysbeth Penner FNP

## 2014-08-14 NOTE — Telephone Encounter (Signed)
Done on 08/13/14 by [place].

## 2014-08-15 ENCOUNTER — Ambulatory Visit (INDEPENDENT_AMBULATORY_CARE_PROVIDER_SITE_OTHER): Payer: BC Managed Care – PPO | Admitting: Pharmacist

## 2014-08-15 ENCOUNTER — Encounter: Payer: Self-pay | Admitting: Pharmacist

## 2014-08-15 VITALS — BP 134/80 | HR 76 | Ht 68.25 in | Wt 188.0 lb

## 2014-08-15 DIAGNOSIS — E1149 Type 2 diabetes mellitus with other diabetic neurological complication: Secondary | ICD-10-CM

## 2014-08-15 DIAGNOSIS — E1140 Type 2 diabetes mellitus with diabetic neuropathy, unspecified: Secondary | ICD-10-CM

## 2014-08-15 DIAGNOSIS — E785 Hyperlipidemia, unspecified: Secondary | ICD-10-CM

## 2014-08-15 DIAGNOSIS — E1142 Type 2 diabetes mellitus with diabetic polyneuropathy: Secondary | ICD-10-CM

## 2014-08-15 LAB — POCT GLYCOSYLATED HEMOGLOBIN (HGB A1C): Hemoglobin A1C: 6.6

## 2014-08-15 MED ORDER — METFORMIN HCL ER 500 MG PO TB24: 500.0000 mg | ORAL_TABLET | Freq: Two times a day (BID) | ORAL | Status: DC

## 2014-08-15 NOTE — Progress Notes (Signed)
Diabetes Follow-Up Visit Chief Complaint:  No chief complaint on file.    Filed Vitals:   08/14/13 1550  BP: 120/70     HPI: patient with long standing type 2 DM.  I last saw her about 3 months ago. Patient has a very significant history of neuropathy and her neurologist has stressed the need to get BG controlled to prevent continued worsening of this condition. Since starting Trulicity she report improved BG and has decreased Lantus from 20 to 18 units daily.   Current Diabetes Medications:  Lantus 18 units at bedtime, amaryl 16m 1 tablet BID, Trulicity 12.9VBq week, Novolog 3 to 5 units tid, Metformin 10051m1/2 tablet bid (patient suppose to take 1 bid but decreased due to nausea and diarrhea)  In past tried Invokana but caused multiple episodes of vaginitis  Exam Edema:  trace  Polyuria:  negative  Polydipsia:  negative Polyphagia:  negatvie  BMI:  Body mass index is 29.83 kg/(m^2).   Weight changes:  Decreased 6# since starting Trulicity General Appearance:  alert, oriented, no acute distress and well nourished Mood/Affect:  normal   Low fat/carbohydrate diet?  Yes - portions have decreased Nicotine Abuse?  No Medication Compliance?  No Exercise?  Not regularly - but walking dog daily Alcohol Abuse?  No  Home BG Monitoring:  Checking 1-2  times a day. Per patient readings have been 140's - 150's - did not bring in glucometer  Lab Results  Component Value Date   HGBA1C 6.6% 04/23/2014    Lab Results  Component Value Date   MICROALBUR 0.50 06/21/2013    Lab Results  Component Value Date   LDLCALC 109* 06/21/2013   TRIG 267* 06/21/2013      Assessment: 1.  Diabetes.  A1c at goal but HBG readings still variable - improved since starting Trulicity 2.  Blood Pressure.  good 3.  Lipids.  Elevated LDL-P and Tg - due recheck but not fasting today   Recommendations: 1.  Medication recommendations at this time are as follows:    Change to  Metformin XR 50042m tablet  bid with food  Continue Trulicity  1.51.6OM/6.0OKnject SQ once weekly. 2.  Reviewed HBG goals:  Fasting 80-130 and 1-2 hour post prandial <180.  Patient is instructed to check BG 3-4 times per day.    3.  BP goal < 140/80. 4.  LDL goal of < 100, HDL > 40 and TG < 150. 5.  Eye Exam yearly and Dental Exam every 6 months. 6.  Physical Activity recommendations:  30 min daily 7.  Patient reminded need to have fasting lipids checked - she is to come in on next day off in am - standing order placed  Orders Placed This Encounter  Procedures  . BMP8+EGFR  . Microalbumin, urine  . LDL Cholesterol, Direct    Standing Status: Standing     Number of Occurrences: 2     Standing Expiration Date: 08/16/2015  . Lipid panel    Standing Status: Standing     Number of Occurrences: 2     Standing Expiration Date: 08/16/2015  . Hepatic function panel    Standing Status: Standing     Number of Occurrences: 2     Standing Expiration Date: 08/16/2015  . POCT glycosylated hemoglobin (Hb A1C)     Return to clinic in 12 wks   Time spent counseling patient:  30 minutes  TamCherre RobinsharmD, CPP, CDE

## 2014-08-16 LAB — BMP8+EGFR
BUN/Creatinine Ratio: 25 — ABNORMAL HIGH (ref 9–23)
BUN: 20 mg/dL (ref 6–24)
CO2: 26 mmol/L (ref 18–29)
Calcium: 9.8 mg/dL (ref 8.7–10.2)
Chloride: 95 mmol/L — ABNORMAL LOW (ref 97–108)
Creatinine, Ser: 0.81 mg/dL (ref 0.57–1.00)
GFR calc Af Amer: 93 mL/min/{1.73_m2} (ref >59)
GFR calc non Af Amer: 80 mL/min/{1.73_m2} (ref >59)
Glucose: 197 mg/dL — ABNORMAL HIGH (ref 65–99)
Potassium: 4.5 mmol/L (ref 3.5–5.2)
Sodium: 136 mmol/L (ref 134–144)

## 2014-08-16 LAB — MICROALBUMIN, URINE: Microalbumin, Urine: 11.8 ug/mL (ref 0.0–17.0)

## 2014-08-20 ENCOUNTER — Telehealth: Payer: Self-pay | Admitting: Pharmacist

## 2014-08-20 NOTE — Telephone Encounter (Signed)
Patient called with lab results from last week.  Microalbumin WNL - chloride slightly low.  Recheck in 3 months. BG was elevated - non fasting labs.

## 2014-09-17 ENCOUNTER — Ambulatory Visit (INDEPENDENT_AMBULATORY_CARE_PROVIDER_SITE_OTHER): Payer: BC Managed Care – PPO | Admitting: Family

## 2014-09-17 ENCOUNTER — Other Ambulatory Visit: Payer: BC Managed Care – PPO

## 2014-09-17 ENCOUNTER — Encounter: Payer: Self-pay | Admitting: Family

## 2014-09-17 VITALS — BP 125/73 | HR 85 | Temp 97.6°F | Ht 68.25 in | Wt 190.0 lb

## 2014-09-17 DIAGNOSIS — Z23 Encounter for immunization: Secondary | ICD-10-CM

## 2014-09-17 DIAGNOSIS — E1140 Type 2 diabetes mellitus with diabetic neuropathy, unspecified: Secondary | ICD-10-CM

## 2014-09-17 DIAGNOSIS — M546 Pain in thoracic spine: Secondary | ICD-10-CM

## 2014-09-17 DIAGNOSIS — J069 Acute upper respiratory infection, unspecified: Secondary | ICD-10-CM

## 2014-09-17 MED ORDER — METHYLPREDNISOLONE ACETATE 40 MG/ML IJ SUSP
40.0000 mg | Freq: Once | INTRAMUSCULAR | Status: AC
  Administered 2014-09-17: 40 mg via INTRAMUSCULAR

## 2014-09-17 MED ORDER — CYCLOBENZAPRINE HCL 10 MG PO TABS: 10.0000 mg | ORAL_TABLET | Freq: Three times a day (TID) | ORAL | Status: DC | PRN

## 2014-09-17 MED ORDER — MELOXICAM 15 MG PO TABS: 15.0000 mg | ORAL_TABLET | Freq: Every day | ORAL | Status: DC

## 2014-09-17 NOTE — Progress Notes (Signed)
Subjective:    Patient ID: Rhonda Thomas, female    DOB: January 25, 1956, 58 y.o.   MRN: [id number]  Back Pain This is a recurrent problem. The current episode started more than 1 month ago ("Couple of months"). The problem occurs intermittently. The problem has been waxing and waning since onset. The pain is present in the thoracic spine. The quality of the pain is described as aching. The pain does not radiate. The pain is at a severity of 7/10. The pain is moderate. The pain is worse during the night. The symptoms are aggravated by coughing. Pertinent negatives include no bladder incontinence, bowel incontinence, dysuria, headaches, leg pain, tingling or weakness. She has tried analgesics for the symptoms. The treatment provided mild relief.  URI  This is a recurrent problem. The current episode started 1 to 4 weeks ago. The problem has been waxing and waning. There has been no fever. Associated symptoms include congestion and coughing. Pertinent negatives include no diarrhea, dysuria, ear pain, headaches, nausea, rash, rhinorrhea, sinus pain or sore throat. Treatments tried: steriods  and antibiotic. The treatment provided no relief.      Review of Systems  Constitutional: Negative.   HENT: Positive for congestion. Negative for ear pain, rhinorrhea and sore throat.   Eyes: Negative.   Respiratory: Positive for cough. Negative for shortness of breath.   Cardiovascular: Negative.  Negative for palpitations.  Gastrointestinal: Negative.  Negative for nausea, diarrhea and bowel incontinence.  Endocrine: Negative.   Genitourinary: Negative.  Negative for bladder incontinence and dysuria.  Musculoskeletal: Positive for back pain.  Skin: Negative for rash.  Neurological: Negative.  Negative for tingling, weakness and headaches.  Hematological: Negative.   Psychiatric/Behavioral: Negative.   All other systems reviewed and are negative.      Objective:   Physical Exam  Vitals  reviewed. Constitutional: She is oriented to person, place, and time. [name]. No distress.  HENT:  Head: Normocephalic and atraumatic.  Right Ear: External ear normal.  Left Ear: External ear normal.  Nasal passage erythemas with mild swelling Oropharynx erythemas  Eyes: Pupils are equal, round, and reactive to light.  Neck: Normal range of motion. Neck supple. No thyromegaly present.  Cardiovascular: Normal rate, regular rhythm, normal heart sounds and intact distal pulses.   No murmur heard. Pulmonary/Chest: Effort normal and breath sounds normal. No respiratory distress. She has no wheezes.  Abdominal: Soft. Bowel sounds are normal. She exhibits no distension. There is no tenderness.  Musculoskeletal: Normal range of motion. She exhibits no edema and no tenderness.  Neurological: She is alert and oriented to person, place, and time. She has normal reflexes. No cranial nerve deficit.  Skin: Skin is warm and dry.  Psychiatric: She has a normal mood and affect. [name]. Judgment and thought content normal.      BP 125/73  Pulse 85  Temp(Src) 97.6 F (36.4 C) (Oral)  Ht 5' 8.25" (1.734 m)  Wt 190 lb (86.183 kg)  BMI 28.66 kg/m2     Assessment & Plan:  1. URI (upper respiratory infection) - Take meds as prescribed - Use a cool mist humidifier  -Use saline nose sprays frequently -Saline irrigations of the nose can be very helpful if done frequently.  * 4X daily for 1 week*  * Use of a nettie pot can be helpful with this. Follow directions with this* -Force fluids -For any cough or congestion  Use plain Mucinex- regular strength or max strength is  fine   * Children- consult with Pharmacist for dosing -For fever or aces or pains- take tylenol or ibuprofen appropriate for age and weight.  * for fevers greater than 101 orally you may alternate ibuprofen and tylenol every  3 hours. -Throat lozenges if help -  methylPREDNISolone acetate (DEPO-MEDROL) injection 40 mg; Inject 1 mL (40 mg total) into the muscle once.  2. Right-sided thoracic back pain -Rest -No other NSAID's while on Mobic -Sedation precaution discussed with flexeril -Pt to watch blood sugars closely with steroid shot  - cyclobenzaprine (FLEXERIL) 10 MG tablet; Take 1 tablet (10 mg total) by mouth 3 (three) times daily as needed for muscle spasms.  Dispense: 30 tablet; Refill: 0 - meloxicam (MOBIC) 15 MG tablet; Take 1 tablet (15 mg total) by mouth daily.  Dispense: 30 tablet; Refill: 0 - methylPREDNISolone acetate (DEPO-MEDROL) injection 40 mg; Inject 1 mL (40 mg total) into the muscle once.  Evelina Dun, FNP

## 2014-09-17 NOTE — Patient Instructions (Addendum)
Back Pain, Adult Low back pain is very common. About 1 in 5 people have back pain.The cause of low back pain is rarely dangerous. The pain often gets better over time.About half of people with a sudden onset of back pain feel better in just 2 weeks. About 8 in 10 people feel better by 6 weeks.  CAUSES Some common causes of back pain include:  Strain of the muscles or ligaments supporting the spine.  Wear and tear (degeneration) of the spinal discs.  Arthritis.  Direct injury to the back. DIAGNOSIS Most of the time, the direct cause of low back pain is not known.However, back pain can be treated effectively even when the exact cause of the pain is unknown.Answering your caregiver's questions about your overall health and symptoms is one of the most accurate ways to make sure the cause of your pain is not dangerous. If your caregiver needs more information, he or she may order lab work or imaging tests (X-rays or MRIs).However, even if imaging tests show changes in your back, this usually does not require surgery. HOME CARE INSTRUCTIONS For many people, back pain returns.Since low back pain is rarely dangerous, it is often a condition that people can learn to manageon their own.   Remain active. It is stressful on the back to sit or stand in one place. Do not sit, drive, or stand in one place for more than 30 minutes at a time. Take short walks on level surfaces as soon as pain allows.Try to increase the length of time you walk each day.  Do not stay in bed.Resting more than 1 or 2 days can delay your recovery.  Do not avoid exercise or work.Your body is made to move.It is not dangerous to be active, even though your back may hurt.Your back will likely heal faster if you return to being active before your pain is gone.  Pay attention to your body when you bend and lift. Many people have less discomfortwhen lifting if they bend their knees, keep the load close to their bodies,and  avoid twisting. Often, the most comfortable positions are those that put less stress on your recovering back.  Find a comfortable position to sleep. Use a firm mattress and lie on your side with your knees slightly bent. If you lie on your back, put a pillow under your knees.  Only take over-the-counter or prescription medicines as directed by your caregiver. Over-the-counter medicines to reduce pain and inflammation are often the most helpful.Your caregiver may prescribe muscle relaxant drugs.These medicines help dull your pain so you can more quickly return to your normal activities and healthy exercise.  Put ice on the injured area.  Put ice in a plastic bag.  Place a towel between your skin and the bag.  Leave the ice on for 15-20 minutes, 03-04 times a day for the first 2 to 3 days. After that, ice and heat may be alternated to reduce pain and spasms.  Ask your caregiver about trying back exercises and gentle massage. This may be of some benefit.  Avoid feeling anxious or stressed.Stress increases muscle tension and can worsen back pain.It is important to recognize when you are anxious or stressed and learn ways to manage it.Exercise is a great option. SEEK MEDICAL CARE IF:  You have pain that is not relieved with rest or medicine.  You have pain that does not improve in 1 week.  You have new symptoms.  You are generally not feeling well. SEEK   IMMEDIATE MEDICAL CARE IF:   You have pain that radiates from your back into your legs.  You develop new bowel or bladder control problems.  You have unusual weakness or numbness in your arms or legs.  You develop nausea or vomiting.  You develop abdominal pain.  You feel faint. Document Released: 11/28/2005 Document Revised: 05/29/2012 Document Reviewed: 04/01/2014 [facility] Patient Information 2015 [place], [place]. This information is not intended to replace advice given to you by your health care provider. Make sure you  discuss any questions you have with your health care provider. Upper Respiratory Infection, Adult An upper respiratory infection (URI) is also known as the common cold. It is often caused by a type of germ (virus). Colds are easily spread (contagious). You can pass it to others by kissing, coughing, sneezing, or drinking out of the same glass. Usually, you get better in 1 or 2 weeks.  HOME CARE   Only take medicine as told by your doctor.  Use a warm mist humidifier or breathe in steam from a hot shower.  Drink enough water and fluids to keep your pee (urine) clear or pale yellow.  Get plenty of rest.  Return to work when your temperature is back to normal or as told by your doctor. You may use a face mask and wash your hands to stop your cold from spreading. GET HELP RIGHT AWAY IF:   After the first few days, you feel you are getting worse.  You have questions about your medicine.  You have chills, shortness of breath, or brown or red spit (mucus).  You have yellow or brown snot (nasal discharge) or pain in the face, especially when you bend forward.  You have a fever, puffy (swollen) neck, pain when you swallow, or white spots in the back of your throat.  You have a bad headache, ear pain, sinus pain, or chest pain.  You have a high-pitched whistling sound when you breathe in and out (wheezing).  You have a lasting cough or cough up blood.  You have sore muscles or a stiff neck. MAKE SURE YOU:   Understand these instructions.  Will watch your condition.  Will get help right away if you are not doing well or get worse. Document Released: 05/16/2008 Document Revised: 02/20/2012 Document Reviewed: 03/05/2014 [facility] Patient Information 2015 [place], [place]. This information is not intended to replace advice given to you by your health care provider. Make sure you discuss any questions you have with your health care provider.  - Take meds as prescribed - Use a cool mist  humidifier  -Use saline nose sprays frequently -Saline irrigations of the nose can be very helpful if done frequently.  * 4X daily for 1 week*  * Use of a nettie pot can be helpful with this. Follow directions with this* -Force fluids -For any cough or congestion  Use plain Mucinex- regular strength or max strength is fine   * Children- consult with Pharmacist for dosing -For fever or aces or pains- take tylenol or ibuprofen appropriate for age and weight.  * for fevers greater than 101 orally you may alternate ibuprofen and tylenol every  3 hours. -Throat lozenges if help   Evelina Dun, FNP

## 2014-09-17 NOTE — Progress Notes (Signed)
Lab only 

## 2014-09-18 ENCOUNTER — Telehealth: Payer: Self-pay | Admitting: Pharmacist

## 2014-09-18 DIAGNOSIS — R945 Abnormal results of liver function studies: Principal | ICD-10-CM

## 2014-09-18 DIAGNOSIS — R7989 Other specified abnormal findings of blood chemistry: Secondary | ICD-10-CM

## 2014-09-18 LAB — HEPATIC FUNCTION PANEL
ALT: 61 IU/L — ABNORMAL HIGH (ref 0–32)
AST: 120 IU/L — ABNORMAL HIGH (ref 0–40)
Albumin: 3.9 g/dL (ref 3.5–5.5)
Alkaline Phosphatase: 74 IU/L (ref 39–117)
Bilirubin, Direct: 0.11 mg/dL (ref 0.00–0.40)
Total Bilirubin: 0.4 mg/dL (ref 0.0–1.2)
Total Protein: 6.1 g/dL (ref 6.0–8.5)

## 2014-09-18 LAB — LIPID PANEL
Chol/HDL Ratio: 4.2 ratio units (ref 0.0–4.4)
Cholesterol, Total: 190 mg/dL (ref 100–199)
HDL: 45 mg/dL (ref >39)
LDL Calculated: 85 mg/dL (ref 0–99)
Triglycerides: 302 mg/dL — ABNORMAL HIGH (ref 0–149)
VLDL Cholesterol Cal: 60 mg/dL — ABNORMAL HIGH (ref 5–40)

## 2014-09-18 LAB — LDL CHOLESTEROL, DIRECT: LDL Direct: 83 mg/dL (ref 0–99)

## 2014-09-19 ENCOUNTER — Other Ambulatory Visit: Payer: Self-pay | Admitting: Family

## 2014-09-24 ENCOUNTER — Other Ambulatory Visit: Payer: Self-pay | Admitting: Nurse Practitioner

## 2014-09-25 ENCOUNTER — Other Ambulatory Visit: Payer: Self-pay | Admitting: Pharmacist

## 2014-09-29 NOTE — Telephone Encounter (Signed)
Patient notified about elevated LFT's - to hold crestor and mobic.  Recheck next week.

## 2014-10-08 ENCOUNTER — Other Ambulatory Visit: Payer: Self-pay

## 2014-10-21 ENCOUNTER — Other Ambulatory Visit: Payer: Self-pay | Admitting: Pharmacist

## 2014-10-23 ENCOUNTER — Other Ambulatory Visit: Payer: Self-pay | Admitting: *Deleted

## 2014-10-23 NOTE — Telephone Encounter (Signed)
Lancets script called to [facility].

## 2014-10-31 ENCOUNTER — Telehealth: Payer: Self-pay | Admitting: Pharmacist

## 2014-10-31 NOTE — Telephone Encounter (Signed)
Patient called to remind that LFTs are due to be rechecked.  Left message with patient's husband and also left message on her home phone to remind  Her.

## 2014-11-29 ENCOUNTER — Ambulatory Visit (INDEPENDENT_AMBULATORY_CARE_PROVIDER_SITE_OTHER): Payer: BC Managed Care – PPO | Admitting: Family Medicine

## 2014-11-29 DIAGNOSIS — N898 Other specified noninflammatory disorders of vagina: Secondary | ICD-10-CM

## 2014-11-29 LAB — POCT URINALYSIS DIPSTICK
Bilirubin, UA: NEGATIVE
Blood, UA: NEGATIVE
Glucose, UA: NEGATIVE
Ketones, UA: NEGATIVE
Leukocytes, UA: NEGATIVE
Nitrite, UA: NEGATIVE
Protein, UA: NEGATIVE
Spec Grav, UA: >=1.03
Urobilinogen, UA: NEGATIVE
pH, UA: 6.5

## 2014-11-29 LAB — POCT UA - MICROSCOPIC ONLY
Bacteria, U Microscopic: NEGATIVE
Casts, Ur, LPF, POC: NEGATIVE
Crystals, Ur, HPF, POC: NEGATIVE
Mucus, UA: NEGATIVE
RBC, urine, microscopic: NEGATIVE
WBC, Ur, HPF, POC: NEGATIVE

## 2014-11-29 MED ORDER — FLUCONAZOLE 150 MG PO TABS: 150.0000 mg | ORAL_TABLET | Freq: Once | ORAL | Status: DC

## 2014-11-29 NOTE — Progress Notes (Signed)
   Subjective:    Patient ID: Rhonda Thomas, female    DOB: 04-23-1956, 58 y.o.   MRN: [id number]  HPI Patient is here for c/o vaginal yeast infection  Review of Systems  Constitutional: Negative for fever.  HENT: Negative for ear pain.   Eyes: Negative for discharge.  Respiratory: Negative for cough.   Cardiovascular: Negative for chest pain.  Gastrointestinal: Negative for abdominal distention.  Endocrine: Negative for polyuria.  Genitourinary: Negative for difficulty urinating.  Musculoskeletal: Negative for gait problem and neck pain.  Skin: Negative for color change and rash.  Neurological: Negative for speech difficulty and headaches.  Psychiatric/Behavioral: Negative for agitation.       Objective:    There were no vitals taken for this visit. Physical Exam  Constitutional: She is oriented to person, place, and time. [name].  HENT:  Head: Normocephalic and atraumatic.  Mouth/Throat: Oropharynx is clear and moist.  Eyes: Pupils are equal, round, and reactive to light.  Neck: Normal range of motion. Neck supple.  Cardiovascular: Normal rate and regular rhythm.   No murmur heard. Pulmonary/Chest: Effort normal and breath sounds normal.  Abdominal: Soft. Bowel sounds are normal. There is no tenderness.  Neurological: She is alert and oriented to person, place, and time.  Skin: Skin is warm and dry.  Psychiatric: She has a normal mood and affect.    Results for orders placed or performed in visit on 11/29/14  POCT UA - Microscopic Only  Result Value Ref Range   WBC, Ur, HPF, POC neg    RBC, urine, microscopic neg    Bacteria, U Microscopic neg    Mucus, UA neg    Epithelial cells, urine per micros occ    Crystals, Ur, HPF, POC neg    Casts, Ur, LPF, POC neg    Yeast, UA mod   POCT urinalysis dipstick  Result Value Ref Range   Color, UA yellow    Clarity, UA clear    Glucose, UA neg    Bilirubin, UA neg    Ketones, UA neg     Spec Grav, UA >=1.030    Blood, UA neg    pH, UA 6.5    Protein, UA neg    Urobilinogen, UA negative    Nitrite, UA neg    Leukocytes, UA Negative         Assessment & Plan:     ICD-9-CM ICD-10-CM   1. Vaginal discharge 623.5 N89.8 POCT UA - Microscopic Only     POCT urinalysis dipstick     fluconazole (DIFLUCAN) 150 MG tablet     No Follow-up on file.  Lysbeth Penner FNP

## 2014-12-05 ENCOUNTER — Encounter (HOSPITAL_COMMUNITY): Payer: Self-pay | Admitting: *Deleted

## 2014-12-05 ENCOUNTER — Emergency Department (HOSPITAL_COMMUNITY): Payer: BC Managed Care – PPO

## 2014-12-05 ENCOUNTER — Emergency Department (HOSPITAL_COMMUNITY)
Admission: EM | Admit: 2014-12-05 | Discharge: 2014-12-05 | Disposition: A | Discharge status: Home / Self Care | Payer: BC Managed Care – PPO | Attending: Emergency Medicine | Admitting: Emergency Medicine

## 2014-12-05 DIAGNOSIS — Z794 Long term (current) use of insulin: Secondary | ICD-10-CM | POA: Diagnosis not present

## 2014-12-05 DIAGNOSIS — R52 Pain, unspecified: Secondary | ICD-10-CM

## 2014-12-05 DIAGNOSIS — M79671 Pain in right foot: Secondary | ICD-10-CM | POA: Diagnosis not present

## 2014-12-05 DIAGNOSIS — E119 Type 2 diabetes mellitus without complications: Secondary | ICD-10-CM | POA: Insufficient documentation

## 2014-12-05 DIAGNOSIS — Z8659 Personal history of other mental and behavioral disorders: Secondary | ICD-10-CM | POA: Insufficient documentation

## 2014-12-05 DIAGNOSIS — Z79899 Other long term (current) drug therapy: Secondary | ICD-10-CM | POA: Diagnosis not present

## 2014-12-05 DIAGNOSIS — G259 Extrapyramidal and movement disorder, unspecified: Secondary | ICD-10-CM | POA: Diagnosis not present

## 2014-12-05 DIAGNOSIS — L089 Local infection of the skin and subcutaneous tissue, unspecified: Secondary | ICD-10-CM | POA: Diagnosis not present

## 2014-12-05 DIAGNOSIS — R509 Fever, unspecified: Secondary | ICD-10-CM | POA: Diagnosis present

## 2014-12-05 LAB — BASIC METABOLIC PANEL
Anion gap: 6 (ref 5–15)
BUN: 14 mg/dL (ref 6–23)
CO2: 28 mmol/L (ref 19–32)
Calcium: 9 mg/dL (ref 8.4–10.5)
Chloride: 105 mEq/L (ref 96–112)
Creatinine, Ser: 0.77 mg/dL (ref 0.50–1.10)
GFR calc Af Amer: >90 mL/min (ref >90)
GFR calc non Af Amer: >90 mL/min (ref >90)
Glucose, Bld: 106 mg/dL — ABNORMAL HIGH (ref 70–99)
Potassium: 4.2 mmol/L (ref 3.5–5.1)
Sodium: 139 mmol/L (ref 135–145)

## 2014-12-05 LAB — CBC WITH DIFFERENTIAL/PLATELET
Basophils Absolute: 0 10*3/uL (ref 0.0–0.1)
Basophils Relative: 0 % (ref 0–1)
Eosinophils Absolute: 0.2 10*3/uL (ref 0.0–0.7)
Eosinophils Relative: 3 % (ref 0–5)
HCT: 37.7 % (ref 36.0–46.0)
Hemoglobin: 12.1 g/dL (ref 12.0–15.0)
Lymphocytes Relative: 31 % (ref 12–46)
Lymphs Abs: 1.9 10*3/uL (ref 0.7–4.0)
MCH: 29.2 pg (ref 26.0–34.0)
MCHC: 32.1 g/dL (ref 30.0–36.0)
MCV: 90.8 fL (ref 78.0–100.0)
Monocytes Absolute: 0.6 10*3/uL (ref 0.1–1.0)
Monocytes Relative: 9 % (ref 3–12)
Neutro Abs: 3.5 10*3/uL (ref 1.7–7.7)
Neutrophils Relative %: 57 % (ref 43–77)
Platelets: 255 10*3/uL (ref 150–400)
RBC: 4.15 MIL/uL (ref 3.87–5.11)
RDW: 12.9 % (ref 11.5–15.5)
WBC: 6.1 10*3/uL (ref 4.0–10.5)

## 2014-12-05 MED ORDER — CLINDAMYCIN HCL 150 MG PO CAPS: ORAL_CAPSULE | ORAL | Status: DC

## 2014-12-05 MED ORDER — CLINDAMYCIN HCL 150 MG PO CAPS
300.0000 mg | ORAL_CAPSULE | Freq: Once | ORAL | Status: AC
  Administered 2014-12-05: 300 mg via ORAL
  Filled 2014-12-05: qty 2

## 2014-12-05 NOTE — ED Notes (Signed)
Right foot dressing applied.

## 2014-12-05 NOTE — Discharge Instructions (Signed)
Follow up with your md as planned monday °

## 2014-12-05 NOTE — ED Provider Notes (Signed)
CSN: [id number]     Arrival date & time 12/05/14  1851 History  This chart was scribed for Maudry Diego, MD by Edison Simon, ED Scribe. This patient was seen in room APA15/APA15 and the patient's care was started at 7:48 PM.    Chief Complaint  Patient presents with  . Fever   Patient is a 58 y.o. female presenting with lower extremity pain. The history is provided by the patient and the spouse. No language interpreter was used.  Foot Pain This is a recurrent problem. The current episode started 6 to 12 hours ago. The problem occurs constantly. The problem has not changed since onset.Pertinent negatives include no chest pain, no abdominal pain and no headaches. The symptoms are aggravated by walking. Nothing relieves the symptoms. She has tried nothing for the symptoms.    HPI Comments: Rhonda Thomas is a 58 y.o. female with history of diabetes and neuropathy who presents to the Emergency Department complaining of right foot swelling and pain with onset today. She notes a wound to the bottom of her right foot "the size of a pencil eraser" that is being treated. She states her pediatrist, Dr. Irving Shows, referred her to the wound center at [facility] where Dr. Zada Girt open, cleaned, and packed the wound 2 days ago but did not do further work because of the upcoming holidays; she has another appointment on 12/28. She was placed on an antibiotic. She was told to come to the ED if she had swelling, pain, or fever. She was alarmed by the pain today because she normally does not have any sensation in her foot. She reports associated subjective fever.  PCP: Dr. Laurance Flatten  When asked about ulceration on her foot, she is not unsure how long it has been there, but her husband believes it was evaluated her Dr. Nils Pyle when she was recently seen.  Past Medical History  Diagnosis Date  . Diabetes   . Anxiety   . Movement disorder   . Neuropathy   . Attention deficit disorder (ADD)    Past Surgical History   Procedure Laterality Date  . Cholecystectomy    . Tubal ligation    . Abdominal hysterectomy    . Toe amputation Right     2nd toe rt foot   Family History  Problem Relation Age of Onset  . Diabetes    . Heart Problems    . Stroke Mother    History  Substance Use Topics  . Smoking status: Never Smoker   . Smokeless tobacco: Never Used  . Alcohol Use: No   OB History    No data available     Review of Systems  Constitutional: Positive for fever. Negative for appetite change and fatigue.  HENT: Negative for congestion, ear discharge and sinus pressure.   Eyes: Negative for discharge.  Respiratory: Negative for cough.   Cardiovascular: Negative for chest pain.  Gastrointestinal: Negative for abdominal pain and diarrhea.  Genitourinary: Negative for frequency and hematuria.  Musculoskeletal: Negative for back pain.  Skin: Positive for wound. Negative for rash.       swelling  Neurological: Negative for seizures and headaches.  Psychiatric/Behavioral: Negative for hallucinations.      Allergies  Actos and Invokana  Home Medications   Prior to Admission medications   Medication Sig Start Date End Date Taking? Authorizing Provider  carbidopa-levodopa (SINEMET IR) 25-100 MG per tablet Take 1 tablet by mouth 3 (three) times daily. 02/11/14  Yes Dennie Bible,  NP  estrogens, conjugated, (PREMARIN) 1.25 MG tablet Take 1 tablet (1.25 mg total) by mouth daily. 08/13/14  Yes Lysbeth Penner, FNP  HYDROcodone-acetaminophen (NORCO) 10-325 MG per tablet Take 1 tablet by mouth daily.  06/17/13  Yes Historical Provider, MD  insulin aspart (NOVOLOG FLEXPEN) 100 UNIT/ML FlexPen Inject 3-5 Units into the skin 3 (three) times daily with meals. And pen needles 4/day 05/14/14  Yes Tammy Eckard, PHARMD  insulin glargine (LANTUS) 100 UNIT/ML injection Inject 18 Units into the skin at bedtime.  05/14/14  Yes Tammy Eckard, PHARMD  metFORMIN (GLUCOPHAGE XR) 500 MG 24 hr tablet Take 1 tablet  (500 mg total) by mouth 2 (two) times daily. Patient taking differently: Take 250 mg by mouth 2 (two) times daily.  08/15/14  Yes Tammy Eckard, PHARMD  OXcarbazepine (TRILEPTAL) 150 MG tablet 1 in the am 2 caps at hs Patient taking differently: Take 150-300 mg by mouth 2 (two) times daily. 1 in the am 2 caps at hs 06/16/14  Yes Dennie Bible, NP  pregabalin (LYRICA) 150 MG capsule Take 1 capsule (150 mg total) by mouth 3 (three) times daily. 06/16/14  Yes Dennie Bible, NP  rosuvastatin (CRESTOR) 10 MG tablet Take 1 tablet (10 mg total) by mouth daily. 08/14/13  Yes Tammy Eckard, PHARMD  sulfamethoxazole-trimethoprim (BACTRIM DS,SEPTRA DS) 800-160 MG per tablet Take 1 tablet by mouth 2 (two) times daily. Starting 12/02/2014 x 20 days. 12/02/14  Yes Historical Provider, MD  TRULICITY 1.5 GB/2.0FE SOPN INJECT 1.5 MG INTO THE SKIN ONCE WEEKLY 09/27/14  Yes Sharion Balloon, FNP  zolpidem (AMBIEN) 10 MG tablet 1/2 to 1 whole tab at bedtime as needed for sleep. Patient taking differently: Take 5-10 mg by mouth at bedtime as needed for sleep. 1/2 to 1 whole tab at bedtime as needed for sleep. 07/15/14  Yes Lysbeth Penner, FNP  carbidopa-levodopa (SINEMET IR) 25-100 MG per tablet TAKE 1 TABLET BY MOUTH THREE TIMES DAILY Patient not taking: Reported on 12/05/2014 09/24/14   Marcial Pacas, MD  cyclobenzaprine (FLEXERIL) 10 MG tablet Take 1 tablet (10 mg total) by mouth 3 (three) times daily as needed for muscle spasms. Patient not taking: Reported on 12/05/2014 09/17/14   Sharion Balloon, FNP  fluconazole (DIFLUCAN) 150 MG tablet Take 1 tablet (150 mg total) by mouth once. Patient not taking: Reported on 12/05/2014 11/29/14   Lysbeth Penner, FNP  glucose blood [facility] VERIO) test strip Use to check BG up to twice a day (dx: 250.02 uncontrolled type 2 DM) 08/14/13   Tammy Eckard, PHARMD  Lancets (ONETOUCH ULTRASOFT) lancets CHECK DAILY AND AS NEEDED 10/22/14   Chipper Herb, MD  meloxicam (MOBIC) 15 MG  tablet Take 1 tablet (15 mg total) by mouth daily. Patient not taking: Reported on 12/05/2014 09/17/14   Theador Hawthorne Hawks, FNP   BP 153/70 mmHg  Pulse 79  Temp(Src) 98.6 F (37 C) (Oral)  Resp 18  Ht 5\' 9"  (1.753 m)  Wt 183 lb (83.008 kg)  BMI 27.01 kg/m2  SpO2 100% Physical Exam  Constitutional: She is oriented to person, place, and time. She appears well-developed.  HENT:  Head: Normocephalic.  Eyes: Conjunctivae are normal.  Neck: No tracheal deviation present.  Cardiovascular:  No murmur heard. Musculoskeletal: Normal range of motion.  Bottom of right foot near her first metatarsal has a hole that is healing well Tip of her large toe is ulcerated and has some area that is black  Neurological: She  is oriented to person, place, and time.  Skin: Skin is warm.  Psychiatric: She has a normal mood and affect.  Nursing note and vitals reviewed.   ED Course  Procedures (including critical care time)  DIAGNOSTIC STUDIES: Oxygen Saturation is 100% on room air, normal by my interpretation.    COORDINATION OF CARE: 8:00 PM Discussed treatment plan with patient at beside, the patient agrees with the plan and has no further questions at this time.   Labs Review Labs Reviewed - No data to display  Imaging Review No results found.   EKG Interpretation None      MDM   Final diagnoses:  None    Labs and x-rays normal.   Foot ulcer healing and looks good.  Pt with supposed fever will add Clindamycin and have pt follow up Monday as planned  The chart was scribed for me under my direct supervision.  I personally performed the history, physical, and medical decision making and all procedures in the evaluation of this patient.Maudry Diego, MD 12/05/14 [phone]

## 2014-12-05 NOTE — ED Notes (Signed)
Pt with left foot wound with packing since Wednesday, fever today, has been taking antibiotics and continued so

## 2014-12-12 HISTORY — PX: UPPER GI ENDOSCOPY: SHX6162

## 2014-12-19 ENCOUNTER — Ambulatory Visit: Payer: BC Managed Care – PPO | Admitting: Nurse Practitioner

## 2015-01-07 ENCOUNTER — Other Ambulatory Visit: Payer: Self-pay | Admitting: Pharmacist

## 2015-01-07 ENCOUNTER — Other Ambulatory Visit: Payer: Self-pay | Admitting: Family Medicine

## 2015-01-07 ENCOUNTER — Encounter: Payer: Self-pay | Admitting: Family Medicine

## 2015-01-07 ENCOUNTER — Ambulatory Visit (INDEPENDENT_AMBULATORY_CARE_PROVIDER_SITE_OTHER): Payer: BLUE CROSS/BLUE SHIELD | Admitting: Family Medicine

## 2015-01-07 VITALS — BP 136/79 | HR 92 | Temp 97.9°F | Ht 68.25 in | Wt 195.0 lb

## 2015-01-07 DIAGNOSIS — I10 Essential (primary) hypertension: Secondary | ICD-10-CM

## 2015-01-07 MED ORDER — DULAGLUTIDE 1.5 MG/0.5ML ~~LOC~~ SOAJ: 1.5000 mg | SUBCUTANEOUS | Status: DC

## 2015-01-07 MED ORDER — CLOTRIMAZOLE 10 MG MT TROC: 10.0000 mg | Freq: Every day | OROMUCOSAL | Status: DC

## 2015-01-07 MED ORDER — LISINOPRIL 10 MG PO TABS: 10.0000 mg | ORAL_TABLET | Freq: Every day | ORAL | Status: DC

## 2015-01-07 NOTE — Patient Instructions (Addendum)
Take novolog before each meal 3 units if glucose is 100-150         4 units if glucose is 151-200         5 units if glucose is >200    DASH Eating Plan DASH stands for "Dietary Approaches to Stop Hypertension." The DASH eating plan is a healthy eating plan that has been shown to reduce high blood pressure (hypertension). Additional health benefits may include reducing the risk of type 2 diabetes mellitus, heart disease, and stroke. The DASH eating plan may also help with weight loss. WHAT DO I NEED TO KNOW ABOUT THE DASH EATING PLAN? For the DASH eating plan, you will follow these general guidelines:  Choose foods with a percent daily value for sodium of less than 5% (as listed on the food label).  Use salt-free seasonings or herbs instead of table salt or sea salt.  Check with your health care provider or pharmacist before using salt substitutes.  Eat lower-sodium products, often labeled as "lower sodium" or "no salt added."  Eat fresh foods.  Eat more vegetables, fruits, and low-fat dairy products.  Choose whole grains. Look for the word "whole" as the first word in the ingredient list.  Choose fish and skinless chicken or [place] more often than red meat. Limit fish, poultry, and meat to 6 oz (170 g) each day.  Limit sweets, desserts, sugars, and sugary drinks.  Choose heart-healthy fats.  Limit cheese to 1 oz (28 g) per day.  Eat more home-cooked food and less restaurant, buffet, and fast food.  Limit fried foods.  Cook foods using methods other than frying.  Limit canned vegetables. If you do use them, rinse them well to decrease the sodium.  When eating at a restaurant, ask that your food be prepared with less salt, or no salt if possible. WHAT FOODS CAN I EAT? Seek help from a dietitian for individual calorie needs. Grains Whole grain or whole wheat bread. Brown rice. Whole grain or whole wheat pasta. Quinoa, bulgur, and whole grain cereals. Low-sodium cereals. Corn  or whole wheat flour tortillas. Whole grain cornbread. Whole grain crackers. Low-sodium crackers. Vegetables Fresh or frozen vegetables (raw, steamed, roasted, or grilled). Low-sodium or reduced-sodium tomato and vegetable juices. Low-sodium or reduced-sodium tomato sauce and paste. Low-sodium or reduced-sodium canned vegetables.  Fruits All fresh, canned (in natural juice), or frozen fruits. Meat and Other Protein Products Ground beef (85% or leaner), grass-fed beef, or beef trimmed of fat. Skinless chicken or [place]. Ground chicken or [place]. Pork trimmed of fat. All fish and seafood. Eggs. Dried beans, peas, or lentils. Unsalted nuts and seeds. Unsalted canned beans. Dairy Low-fat dairy products, such as skim or 1% milk, 2% or reduced-fat cheeses, low-fat ricotta or cottage cheese, or plain low-fat yogurt. Low-sodium or reduced-sodium cheeses. Fats and Oils Tub margarines without trans fats. Light or reduced-fat mayonnaise and salad dressings (reduced sodium). Avocado. Safflower, olive, or canola oils. Natural peanut or almond butter. Other Unsalted popcorn and pretzels. The items listed above may not be a complete list of recommended foods or beverages. Contact your dietitian for more options. WHAT FOODS ARE NOT RECOMMENDED? Grains White bread. White pasta. White rice. Refined cornbread. Bagels and croissants. Crackers that contain trans fat. Vegetables Creamed or fried vegetables. Vegetables in a cheese sauce. Regular canned vegetables. Regular canned tomato sauce and paste. Regular tomato and vegetable juices. Fruits Dried fruits. Canned fruit in light or heavy syrup. Fruit juice. Meat and Other Protein Products Fatty  cuts of meat. Ribs, chicken wings, bacon, sausage, bologna, salami, chitterlings, fatback, hot dogs, bratwurst, and packaged luncheon meats. Salted nuts and seeds. Canned beans with salt. Dairy Whole or 2% milk, cream, half-and-half, and cream cheese. Whole-fat or  sweetened yogurt. Full-fat cheeses or blue cheese. Nondairy creamers and whipped toppings. Processed cheese, cheese spreads, or cheese curds. Condiments Onion and garlic salt, seasoned salt, table salt, and sea salt. Canned and packaged gravies. Worcestershire sauce. Tartar sauce. Barbecue sauce. Teriyaki sauce. Soy sauce, including reduced sodium. Steak sauce. Fish sauce. Oyster sauce. Cocktail sauce. Horseradish. Ketchup and mustard. Meat flavorings and tenderizers. Bouillon cubes. Hot sauce. Tabasco sauce. Marinades. Taco seasonings. Relishes. Fats and Oils Butter, stick margarine, lard, shortening, ghee, and bacon fat. Coconut, palm kernel, or palm oils. Regular salad dressings. Other Pickles and olives. Salted popcorn and pretzels. The items listed above may not be a complete list of foods and beverages to avoid. Contact your dietitian for more information. WHERE CAN I FIND MORE INFORMATION? National Heart, Lung, and Blood Institute: travelstabloid.com Document Released: 11/17/2011 Document Revised: 04/14/2014 Document Reviewed: 10/02/2013 [facility] Patient Information 2015 [place], [place]. This information is not intended to replace advice given to you by your health care provider. Make sure you discuss any questions you have with your health care provider. Hypertension Hypertension, commonly called high blood pressure, is when the force of blood pumping through your arteries is too strong. Your arteries are the blood vessels that carry blood from your heart throughout your body. A blood pressure reading consists of a higher number over a lower number, such as 110/72. The higher number (systolic) is the pressure inside your arteries when your heart pumps. The lower number (diastolic) is the pressure inside your arteries when your heart relaxes. Ideally you want your blood pressure below 120/80. Hypertension forces your heart to work harder to pump blood. Your  arteries may become narrow or stiff. Having hypertension puts you at risk for heart disease, stroke, and other problems.  RISK FACTORS Some risk factors for high blood pressure are controllable. Others are not.  Risk factors you cannot control include:   Race. You may be at higher risk if you are African American.  Age. Risk increases with age.  Gender. Men are at higher risk than women before age 20 years. After age 96, women are at higher risk than men. Risk factors you can control include:  Not getting enough exercise or physical activity.  Being overweight.  Getting too much fat, sugar, calories, or salt in your diet.  Drinking too much alcohol. SIGNS AND SYMPTOMS Hypertension does not usually cause signs or symptoms. Extremely high blood pressure (hypertensive crisis) may cause headache, anxiety, shortness of breath, and nosebleed. DIAGNOSIS  To check if you have hypertension, your health care provider will measure your blood pressure while you are seated, with your arm held at the level of your heart. It should be measured at least twice using the same arm. Certain conditions can cause a difference in blood pressure between your right and left arms. A blood pressure reading that is higher than normal on one occasion does not mean that you need treatment. If one blood pressure reading is high, ask your health care provider about having it checked again. TREATMENT  Treating high blood pressure includes making lifestyle changes and possibly taking medicine. Living a healthy lifestyle can help lower high blood pressure. You may need to change some of your habits. Lifestyle changes may include:  Following the DASH diet.  This diet is high in fruits, vegetables, and whole grains. It is low in salt, red meat, and added sugars.  Getting at least 2 hours of brisk physical activity every week.  Losing weight if necessary.  Not smoking.  Limiting alcoholic beverages.  Learning ways to  reduce stress. If lifestyle changes are not enough to get your blood pressure under control, your health care provider may prescribe medicine. You may need to take more than one. Work closely with your health care provider to understand the risks and benefits. HOME CARE INSTRUCTIONS  Have your blood pressure rechecked as directed by your health care provider.   Take medicines only as directed by your health care provider. Follow the directions carefully. Blood pressure medicines must be taken as prescribed. The medicine does not work as well when you skip doses. Skipping doses also puts you at risk for problems.   Do not smoke.   Monitor your blood pressure at home as directed by your health care provider. SEEK MEDICAL CARE IF:   You think you are having a reaction to medicines taken.  You have recurrent headaches or feel dizzy.  You have swelling in your ankles.  You have trouble with your vision. SEEK IMMEDIATE MEDICAL CARE IF:  You develop a severe headache or confusion.  You have unusual weakness, numbness, or feel faint.  You have severe chest or abdominal pain.  You vomit repeatedly.  You have trouble breathing. MAKE SURE YOU:   Understand these instructions.  Will watch your condition.  Will get help right away if you are not doing well or get worse. Document Released: 11/28/2005 Document Revised: 04/14/2014 Document Reviewed: 09/20/2013 [facility] Patient Information 2015 [place], [place]. This information is not intended to replace advice given to you by your health care provider. Make sure you discuss any questions you have with your health care provider.

## 2015-01-07 NOTE — Progress Notes (Signed)
Subjective:    Patient ID: Rhonda Thomas, female    DOB: Oct 30, 1956, 59 y.o.   MRN: [id number]  HPI  Patient is being seen today for elevated blood pressure readings noted while hospitalized for cellulitis from a non healing diabetic ulcer. It was recently debrided for osteomyelitis. She could not afford to have dressing changes by home health so her husband is doing her dressing changes Patient has never been diagnosed with high blood pressure.Multiple readings were elevated when hospitalized. She has been a diabetic for several years. She has apparently not been taking an ACE inhibitor. She has not had recent hypoglycemia or elevated glucose. She checks regularly at home.  Allergies  Allergen Reactions  . Actos [Pioglitazone]     Edema   . Invokana [Canagliflozin]     vaginitis    Outpatient Encounter Prescriptions as of 01/07/2015  Medication Sig  . carbidopa-levodopa (SINEMET IR) 25-100 MG per tablet Take 1 tablet by mouth 3 (three) times daily.  Marland Kitchen estrogens, conjugated, (PREMARIN) 1.25 MG tablet Take 1 tablet (1.25 mg total) by mouth daily.  Marland Kitchen glucose blood (ONETOUCH VERIO) test strip Use to check BG up to twice a day (dx: 250.02 uncontrolled type 2 DM)  . HYDROcodone-acetaminophen (NORCO) 10-325 MG per tablet Take 1-2 tablets by mouth every 4 (four) hours as needed.   . insulin aspart (NOVOLOG FLEXPEN) 100 UNIT/ML FlexPen Inject 3-5 Units into the skin 3 (three) times daily with meals. And pen needles 4/day  . insulin glargine (LANTUS) 100 UNIT/ML injection Inject 18 Units into the skin at bedtime.   . Lancets (ONETOUCH ULTRASOFT) lancets CHECK DAILY AND AS NEEDED  . metFORMIN (GLUCOPHAGE XR) 500 MG 24 hr tablet Take 1 tablet (500 mg total) by mouth 2 (two) times daily. (Patient taking differently: Take 250 mg by mouth 2 (two) times daily. )  . OXcarbazepine (TRILEPTAL) 150 MG tablet 1 in the am 2 caps at hs (Patient taking differently: Take 150-300 mg by mouth 2 (two) times daily. 1  in the am 2 caps at hs)  . pregabalin (LYRICA) 150 MG capsule Take 1 capsule (150 mg total) by mouth 3 (three) times daily.  . TRULICITY 1.5 SW/5.4OE SOPN INJECT 1.5 MG INTO THE SKIN ONCE WEEKLY  . zolpidem (AMBIEN) 10 MG tablet 1/2 to 1 whole tab at bedtime as needed for sleep. (Patient taking differently: Take 5-10 mg by mouth at bedtime as needed for sleep. 1/2 to 1 whole tab at bedtime as needed for sleep.)  . [DISCONTINUED] clindamycin (CLEOCIN) 150 MG capsule Take 2 tablets 4 times a day  . ciprofloxacin (CIPRO) 500 MG tablet   . [DISCONTINUED] carbidopa-levodopa (SINEMET IR) 25-100 MG per tablet TAKE 1 TABLET BY MOUTH THREE TIMES DAILY (Patient not taking: Reported on 12/05/2014)  . [DISCONTINUED] cyclobenzaprine (FLEXERIL) 10 MG tablet Take 1 tablet (10 mg total) by mouth 3 (three) times daily as needed for muscle spasms. (Patient not taking: Reported on 12/05/2014)  . [DISCONTINUED] fluconazole (DIFLUCAN) 150 MG tablet Take 1 tablet (150 mg total) by mouth once. (Patient not taking: Reported on 12/05/2014)  . [DISCONTINUED] HYDROcodone-acetaminophen (NORCO) 10-325 MG per tablet Take 1 tablet by mouth daily.   . [DISCONTINUED] meloxicam (MOBIC) 15 MG tablet Take 1 tablet (15 mg total) by mouth daily. (Patient not taking: Reported on 12/05/2014)  . [DISCONTINUED] rosuvastatin (CRESTOR) 10 MG tablet Take 1 tablet (10 mg total) by mouth daily. (Patient not taking: Reported on 01/07/2015)  . [DISCONTINUED] sulfamethoxazole-trimethoprim (BACTRIM DS,SEPTRA DS) 800-160  MG per tablet Take 1 tablet by mouth 2 (two) times daily. Starting 12/02/2014 x 20 days.    Past Medical History  Diagnosis Date  . Diabetes   . Anxiety   . Movement disorder   . Neuropathy   . Attention deficit disorder (ADD)     Past Surgical History  Procedure Laterality Date  . Cholecystectomy    . Tubal ligation    . Abdominal hysterectomy    . Toe amputation Right     2nd toe rt foot    History   Social History   . Marital Status: Married    Spouse Name: James    Number of Children: 3  . Years of Education: college   Occupational History  .      [employer]   Social History Main Topics  . Smoking status: Never Smoker   . Smokeless tobacco: Never Used  . Alcohol Use: No  . Drug Use: No  . Sexual Activity: Not on file   Other Topics Concern  . Not on file   Social History Narrative   Patient lives at home with her husband. (James).   Patient works for [employer].    Education- College   Right handed.   Caffeine- tea one cup daily.                  Review of Systems  Constitutional: Negative for fever, chills, diaphoresis, appetite change, fatigue and unexpected weight change.  HENT: Negative for congestion, ear pain, hearing loss, postnasal drip, rhinorrhea, sneezing, sore throat and trouble swallowing.   Eyes: Negative for pain.  Respiratory: Negative for cough, chest tightness and shortness of breath.   Cardiovascular: Negative for chest pain and palpitations.  Gastrointestinal: Negative for nausea, vomiting, abdominal pain, diarrhea and constipation.  Genitourinary: Negative for dysuria, frequency and menstrual problem.  Musculoskeletal: Negative for joint swelling and arthralgias.  Skin: Negative for rash.  Neurological: Negative for dizziness, weakness, numbness and headaches.  Psychiatric/Behavioral: Negative for dysphoric mood and agitation.       Objective:   Physical Exam  Constitutional: She is oriented to person, place, and time. [name]. No distress.  HENT:  Head: Normocephalic and atraumatic.  Right Ear: External ear normal.  Left Ear: External ear normal.  Nose: Nose normal.  Mouth/Throat: Oropharynx is clear and moist.  Eyes: Conjunctivae and EOM are normal. Pupils are equal, round, and reactive to light.  Neck: Normal range of motion. Neck supple. No thyromegaly present.    Cardiovascular: Normal rate, regular rhythm and normal heart sounds.   No murmur heard. Pulmonary/Chest: Effort normal and breath sounds normal. No respiratory distress. She has no wheezes. She has no rales.  Abdominal: Soft. Bowel sounds are normal. She exhibits no distension. There is no tenderness.  Musculoskeletal: She exhibits no edema (right foot bandaged due to recent surgery).  Lymphadenopathy:    She has no cervical adenopathy.  Neurological: She is alert and oriented to person, place, and time. She has normal reflexes.  Skin: Skin is warm and dry.  Psychiatric: She has a normal mood and affect. [name]. Judgment and thought content normal.   BP 136/79 mmHg  Pulse 92  Temp(Src) 97.9 F (36.6 C) (Oral)  Ht 5' 8.25" (1.734 m)  Wt 195 lb (88.451 kg)  BMI 29.42 kg/m2         Assessment & Plan:   1. Essential hypertension       Meds ordered this encounter  Medications  . ciprofloxacin (CIPRO) 500 MG tablet    Sig:   . HYDROcodone-acetaminophen (NORCO) 10-325 MG per tablet    Sig: Take 1-2 tablets by mouth every 4 (four) hours as needed.   . Dulaglutide (TRULICITY) 1.5 EH/6.3JS SOPN    Sig: Inject 1.5 mg into the skin once a week.    Dispense:  2 mL    Refill:  2  . clotrimazole (MYCELEX) 10 MG troche    Sig: Take 1 tablet (10 mg total) by mouth 5 (five) times daily.    Dispense:  35 tablet    Refill:  0  . lisinopril (ZESTRIL) 10 MG tablet    Sig: Take 1 tablet (10 mg total) by mouth daily.    Dispense:  30 tablet    Refill:  5    Orders Placed This Encounter  Procedures  . BMP8+EGFR    Labs pending Health Maintenance reviewed Diet and exercise encouraged Continue all meds as discussed Follow up in 1 mo  Claretta Fraise, MD

## 2015-01-08 ENCOUNTER — Other Ambulatory Visit: Payer: Self-pay | Admitting: Family Medicine

## 2015-01-08 LAB — BMP8+EGFR
BUN/Creatinine Ratio: 9 (ref 9–23)
BUN: 11 mg/dL (ref 6–24)
CO2: 28 mmol/L (ref 18–29)
Calcium: 9.1 mg/dL (ref 8.7–10.2)
Chloride: 99 mmol/L (ref 97–108)
Creatinine, Ser: 1.25 mg/dL — ABNORMAL HIGH (ref 0.57–1.00)
GFR calc Af Amer: 55 mL/min/{1.73_m2} — ABNORMAL LOW (ref >59)
GFR calc non Af Amer: 48 mL/min/{1.73_m2} — ABNORMAL LOW (ref >59)
Glucose: 143 mg/dL — ABNORMAL HIGH (ref 65–99)
Potassium: 4.9 mmol/L (ref 3.5–5.2)
Sodium: 141 mmol/L (ref 134–144)

## 2015-01-08 MED ORDER — RAMIPRIL 5 MG PO CAPS: 5.0000 mg | ORAL_CAPSULE | Freq: Every day | ORAL | Status: DC

## 2015-01-08 NOTE — Telephone Encounter (Signed)
Last seen 01/07/15 Dr Livia Snellen  If approved route to nurse to call into [facility]  [phone]

## 2015-01-09 LAB — HEPATIC FUNCTION PANEL
ALT: 11 IU/L (ref 0–32)
AST: 8 IU/L (ref 0–40)
Albumin: 3.7 g/dL (ref 3.5–5.5)
Alkaline Phosphatase: 79 IU/L (ref 39–117)
Bilirubin, Direct: 0.08 mg/dL (ref 0.00–0.40)
Total Bilirubin: 0.2 mg/dL (ref 0.0–1.2)
Total Protein: 6.6 g/dL (ref 6.0–8.5)

## 2015-01-09 LAB — SPECIMEN STATUS REPORT

## 2015-01-09 NOTE — Telephone Encounter (Signed)
Please review and advise.

## 2015-01-16 ENCOUNTER — Encounter: Payer: Self-pay | Admitting: Family Medicine

## 2015-01-16 ENCOUNTER — Ambulatory Visit (INDEPENDENT_AMBULATORY_CARE_PROVIDER_SITE_OTHER): Payer: BLUE CROSS/BLUE SHIELD | Admitting: Family Medicine

## 2015-01-16 VITALS — BP 145/89 | HR 84 | Temp 98.7°F | Ht 68.25 in | Wt 188.0 lb

## 2015-01-16 DIAGNOSIS — F411 Generalized anxiety disorder: Secondary | ICD-10-CM

## 2015-01-16 MED ORDER — SERTRALINE HCL 50 MG PO TABS: 50.0000 mg | ORAL_TABLET | Freq: Every day | ORAL | Status: DC

## 2015-01-16 MED ORDER — ALPRAZOLAM 0.5 MG PO TABS: 0.5000 mg | ORAL_TABLET | Freq: Every evening | ORAL | Status: DC | PRN

## 2015-01-16 NOTE — Telephone Encounter (Signed)
RX called into [facility] per Dr Livia Snellen

## 2015-01-16 NOTE — Progress Notes (Signed)
   Subjective:    Patient ID: Rhonda Thomas, female    DOB: [birth year], 59 y.o.   MRN: [id number]  HPI Patient is here for c/o panic and anxiety.  She is worried about her right foot.  She has had recent foot surgery by Dr. Marquette Old and she wanted to have this looked at to make sure it is not infected.  She states she has been dealing with GAD sx's and wanted to be tx'd for this.  Review of Systems  Constitutional: Negative for fever.  HENT: Negative for ear pain.   Eyes: Negative for discharge.  Respiratory: Negative for cough.   Cardiovascular: Negative for chest pain.  Gastrointestinal: Negative for abdominal distention.  Endocrine: Negative for polyuria.  Genitourinary: Negative for difficulty urinating.  Musculoskeletal: Negative for gait problem and neck pain.  Skin: Negative for color change and rash.  Neurological: Negative for speech difficulty and headaches.  Psychiatric/Behavioral: Negative for agitation.       Objective:    BP 145/89 mmHg  Pulse 84  Temp(Src) 98.7 F (37.1 C) (Oral)  Ht 5' 8.25" (1.734 m)  Wt 188 lb (85.276 kg)  BMI 28.36 kg/m2 Physical Exam  Constitutional: She is oriented to person, place, and time. [name].  HENT:  Head: Normocephalic and atraumatic.  Mouth/Throat: Oropharynx is clear and moist.  Eyes: Pupils are equal, round, and reactive to light.  Neck: Normal range of motion. Neck supple.  Cardiovascular: Normal rate and regular rhythm.   No murmur heard. Pulmonary/Chest: Effort normal and breath sounds normal.  Abdominal: Soft. Bowel sounds are normal. There is no tenderness.  Neurological: She is alert and oriented to person, place, and time.  Skin: Skin is warm and dry.  Psychiatric: She has a normal mood and affect.          Assessment & Plan:     ICD-9-CM ICD-10-CM   1. GAD (generalized anxiety disorder) 300.02 F41.1 ALPRAZolam (XANAX) 0.5 MG tablet     sertraline (ZOLOFT) 50 MG  tablet   2. S/p right foot surgery - Reassured right foot looks good and follow up with Dr. Irving Shows.  No Follow-up on file.  Lysbeth Penner FNP

## 2015-01-19 ENCOUNTER — Other Ambulatory Visit: Payer: Self-pay | Admitting: Endocrinology

## 2015-01-22 ENCOUNTER — Ambulatory Visit (INDEPENDENT_AMBULATORY_CARE_PROVIDER_SITE_OTHER): Payer: BLUE CROSS/BLUE SHIELD | Admitting: Family Medicine

## 2015-01-22 ENCOUNTER — Encounter: Payer: Self-pay | Admitting: Family Medicine

## 2015-01-22 VITALS — BP 144/79 | HR 71 | Temp 97.8°F | Ht 68.0 in | Wt 191.6 lb

## 2015-01-22 DIAGNOSIS — I10 Essential (primary) hypertension: Secondary | ICD-10-CM

## 2015-01-22 DIAGNOSIS — J069 Acute upper respiratory infection, unspecified: Secondary | ICD-10-CM

## 2015-01-22 MED ORDER — PSEUDOEPHEDRINE-GUAIFENESIN ER 60-600 MG PO TB12: 1.0000 | ORAL_TABLET | Freq: Two times a day (BID) | ORAL | Status: DC

## 2015-01-22 NOTE — Patient Instructions (Signed)
Continue to monitor BP regularly. If BO goes over 130/85. Consider starting the med as previously ordered.

## 2015-01-22 NOTE — Progress Notes (Signed)
Subjective:  Patient ID: Rhonda Thomas, female    DOB: 1956/04/13  Age: 59 y.o. MRN: [id number]  CC: Hypertension   HPI Rhonda Thomas presents for  follow-up of hypertension. Patient has no history of headache chest pain or shortness of breath or recent cough. Patient also denies symptoms of TIA such as numbness weakness lateralizing. Patient checks  blood pressure at home and has not had any elevated readings recently.  Because of this she opted not to take the medication as previously ordered. She has some upper respiratory congestion. No purulent rhinorrhea. minimal posterior drainage. some frontal headache.  History Rhonda Thomas has a past medical history of Diabetes; Anxiety; Movement disorder; Neuropathy; and Attention deficit disorder (ADD).   She has past surgical history that includes Cholecystectomy; Tubal ligation; Abdominal hysterectomy; and Toe amputation (Right).   Her family history includes Diabetes in an other family member; Heart Problems in an other family member; Stroke in her mother.She reports that she has never smoked. She has never used smokeless tobacco. She reports that she does not drink alcohol or use illicit drugs.  Current Outpatient Prescriptions on File Prior to Visit  Medication Sig Dispense Refill  . carbidopa-levodopa (SINEMET IR) 25-100 MG per tablet Take 1 tablet by mouth 3 (three) times daily. 90 tablet 6  . ciprofloxacin (CIPRO) 500 MG tablet     . clindamycin (CLEOCIN) 150 MG capsule Take 300 mg by mouth 2 (two) times daily.    . clotrimazole (MYCELEX) 10 MG troche Take 1 tablet (10 mg total) by mouth 5 (five) times daily. 35 tablet 0  . Dulaglutide (TRULICITY) 1.5 KZ/9.9JT SOPN Inject 1.5 mg into the skin once a week. 2 mL 2  . estrogens, conjugated, (PREMARIN) 1.25 MG tablet Take 1 tablet (1.25 mg total) by mouth daily. 30 tablet 11  . HYDROcodone-acetaminophen (NORCO) 10-325 MG per tablet Take 1-2 tablets by mouth every 4 (four) hours as needed.     .  insulin aspart (NOVOLOG FLEXPEN) 100 UNIT/ML FlexPen Inject 3-5 Units into the skin 3 (three) times daily with meals. And pen needles 4/day 15 mL 11  . Insulin Glargine (LANTUS SOLOSTAR) 100 UNIT/ML Solostar Pen Inject 20 units daily. APPOINTMENT NEEDED FOR FURTHER REFILLS 15 mL 0  . insulin glargine (LANTUS) 100 UNIT/ML injection Inject 18 Units into the skin at bedtime.  15 mL 11  . Lancets (ONETOUCH ULTRASOFT) lancets CHECK DAILY AND AS NEEDED 100 each 3  . metFORMIN (GLUCOPHAGE XR) 500 MG 24 hr tablet Take 1 tablet (500 mg total) by mouth 2 (two) times daily. 60 tablet 2  . ONETOUCH VERIO test strip USE TO TEST BLOOD GLUCOSE TWICE DAILY 100 each 2  . OXcarbazepine (TRILEPTAL) 150 MG tablet 1 in the am 2 caps at hs (Patient taking differently: Take 150-300 mg by mouth 2 (two) times daily. 1 in the am 2 caps at hs) 90 tablet 6  . pregabalin (LYRICA) 150 MG capsule Take 1 capsule (150 mg total) by mouth 3 (three) times daily. 90 capsule 5  . sertraline (ZOLOFT) 50 MG tablet Take 1 tablet (50 mg total) by mouth daily. 30 tablet 3  . zolpidem (AMBIEN) 10 MG tablet TAKE 1/2 - 1 TABLET BY MOUTH AT BEDTIME AS NEEDED FOR SLEEP 30 tablet 2  . ALPRAZolam (XANAX) 0.5 MG tablet Take 1 tablet (0.5 mg total) by mouth at bedtime as needed for anxiety. (Patient not taking: Reported on 01/22/2015) 30 tablet 0  . lisinopril (ZESTRIL) 10 MG tablet Take 1  tablet (10 mg total) by mouth daily. (Patient not taking: Reported on 01/16/2015) 30 tablet 5  . ramipril (ALTACE) 5 MG capsule Take 1 capsule (5 mg total) by mouth daily. (Patient not taking: Reported on 01/16/2015) 30 capsule 11   No current facility-administered medications on file prior to visit.    ROS Review of Systems  Constitutional: Negative for fever, chills, diaphoresis, appetite change, fatigue and unexpected weight change.  HENT: Negative for congestion, ear pain, hearing loss, postnasal drip, rhinorrhea, sneezing, sore throat and trouble swallowing.     Eyes: Negative for pain.  Respiratory: Negative for cough, chest tightness and shortness of breath.   Cardiovascular: Negative for chest pain and palpitations.  Gastrointestinal: Negative for nausea, vomiting, abdominal pain, diarrhea and constipation.  Genitourinary: Negative for dysuria, frequency and menstrual problem.  Musculoskeletal: Negative for joint swelling and arthralgias.  Skin: Negative for rash.  Neurological: Negative for dizziness, weakness, numbness and headaches.  Psychiatric/Behavioral: Negative for dysphoric mood and agitation.    Objective:  BP 144/79 mmHg  Pulse 71  Temp(Src) 97.8 F (36.6 C) (Oral)  Ht 5\' 8"  (1.727 m)  Wt 191 lb 9.6 oz (86.909 kg)  BMI 29.14 kg/m2  BP Readings from Last 3 Encounters:  01/22/15 144/79  01/16/15 145/89  01/07/15 136/79    Wt Readings from Last 3 Encounters:  01/22/15 191 lb 9.6 oz (86.909 kg)  01/16/15 188 lb (85.276 kg)  01/07/15 195 lb (88.451 kg)     Physical Exam  Constitutional: She is oriented to person, place, and time. [name]. No distress.  HENT:  Head: Normocephalic and atraumatic.  Right Ear: External ear normal.  Left Ear: External ear normal.  Nose: Nose normal.  Mouth/Throat: Oropharynx is clear and moist.  Eyes: Conjunctivae and EOM are normal. Pupils are equal, round, and reactive to light.  Neck: Normal range of motion. Neck supple. No thyromegaly present.  Cardiovascular: Normal rate, regular rhythm and normal heart sounds.   No murmur heard. Pulmonary/Chest: Effort normal and breath sounds normal. No respiratory distress. She has no wheezes. She has no rales.  Abdominal: Soft. Bowel sounds are normal. She exhibits no distension. There is no tenderness.  Lymphadenopathy:    She has no cervical adenopathy.  Neurological: She is alert and oriented to person, place, and time. She has normal reflexes.  Skin: Skin is warm and dry.  Psychiatric: She has a  normal mood and affect. [name]. Judgment and thought content normal.    Lab Results  Component Value Date   HGBA1C 6.6 08/15/2014   HGBA1C 6.6 04/23/2014   HGBA1C 7.6* 11/21/2013    Lab Results  Component Value Date   WBC 6.1 12/05/2014   HGB 12.1 12/05/2014   HCT 37.7 12/05/2014   PLT 255 12/05/2014   GLUCOSE 143* 01/07/2015   TRIG 302* 09/17/2014   HDL 45 09/17/2014   LDLDIRECT 83 09/17/2014   LDLCALC 85 09/17/2014   ALT 11 01/07/2015   AST 8 01/07/2015   NA 141 01/07/2015   K 4.9 01/07/2015   CL 99 01/07/2015   CREATININE 1.25* 01/07/2015   BUN 11 01/07/2015   CO2 28 01/07/2015   HGBA1C 6.6 08/15/2014   MICROALBUR 0.50 06/21/2013    Dg Foot Complete Right  12/05/2014   CLINICAL DATA:  Open wound at the plantar surface of the right foot. Initial encounter.  EXAM: RIGHT FOOT COMPLETE - 3+ VIEW  COMPARISON:  Right foot radiographs performed 11/12/2014  FINDINGS: The previously noted soft tissue ulceration at the plantar aspect of the foot is less well characterized, with slightly improved soft tissue swelling. There is no evidence of osseous erosion to suggest osteomyelitis. The patient is status post amputation of the distal second digit, at the distal aspect of the second proximal phalanx.  A thin metallic wire foreign body is again noted embedded within the soft tissues of the plantar midfoot, measuring approximately 1.0 cm in length. Visualized joint spaces are grossly preserved. Scattered debris is noted at the distal aspect of the first toe.  Plantar and posterior calcaneal spurs are seen. The subtalar joint is unremarkable in appearance.  IMPRESSION: 1. Soft tissue ulceration at the plantar aspect of the foot is less well characterized, with slightly improved soft tissue swelling. No evidence of osseous erosion to suggest osteomyelitis. 2. Metallic wire foreign body again noted embedded within the soft tissues of the plantar midfoot, measuring approximately  1.0 cm in length. 3. Scattered debris noted at the distal aspect of the first toe. 4. Status post amputation of the distal second digit.   Electronically Signed   By: Garald Balding M.D.   On: 12/05/2014 21:30    Assessment & Plan:   Nathalee was seen today for hypertension.  Diagnoses and all orders for this visit:  Essential hypertension  Acute upper respiratory infection  Other orders -     pseudoephedrine-guaifenesin (MUCINEX D) 60-600 MG per tablet; Take 1 tablet by mouth every 12 (twelve) hours.  I am having Ms. Protzman start on pseudoephedrine-guaifenesin. I am also having her maintain her carbidopa-levodopa, insulin aspart, insulin glargine, OXcarbazepine, pregabalin, estrogens (conjugated), metFORMIN, onetouch ultrasoft, ciprofloxacin, HYDROcodone-acetaminophen, Dulaglutide, clotrimazole, lisinopril, ONETOUCH VERIO, zolpidem, ramipril, clindamycin, ALPRAZolam, sertraline, Insulin Glargine, and UNIFINE PENTIPS.  Meds ordered this encounter  Medications  . UNIFINE PENTIPS 31G X 6 MM MISC    Sig:   . pseudoephedrine-guaifenesin (MUCINEX D) 60-600 MG per tablet    Sig: Take 1 tablet by mouth every 12 (twelve) hours.    Dispense:  20 tablet    Refill:  11      Follow-up: Return in about 1 month (around 02/20/2015) for diabetes, Depression, hypertension.  Claretta Fraise, M.D.

## 2015-02-18 ENCOUNTER — Other Ambulatory Visit: Payer: Self-pay | Admitting: Neurology

## 2015-02-18 ENCOUNTER — Other Ambulatory Visit: Payer: Self-pay | Admitting: Pharmacist

## 2015-02-19 NOTE — Telephone Encounter (Signed)
Rx signed and faxed.

## 2015-02-20 ENCOUNTER — Ambulatory Visit: Payer: BLUE CROSS/BLUE SHIELD | Admitting: Family Medicine

## 2015-02-25 ENCOUNTER — Ambulatory Visit (HOSPITAL_COMMUNITY)
Admission: RE | Admit: 2015-02-25 | Discharge: 2015-02-25 | Disposition: A | Discharge status: Home / Self Care | Payer: BC Managed Care – PPO | Source: Ambulatory Visit | Attending: Podiatry | Admitting: Podiatry

## 2015-02-25 ENCOUNTER — Encounter (HOSPITAL_COMMUNITY): Payer: Self-pay | Admitting: *Deleted

## 2015-02-25 DIAGNOSIS — L03115 Cellulitis of right lower limb: Secondary | ICD-10-CM | POA: Insufficient documentation

## 2015-02-25 MED ORDER — SODIUM CHLORIDE 0.9 % IJ SOLN: 10.0000 mL | Freq: Two times a day (BID) | INTRAMUSCULAR | Status: DC

## 2015-02-25 MED ORDER — SODIUM CHLORIDE 0.9 % IJ SOLN: 10.0000 mL | INTRAMUSCULAR | Status: DC | PRN

## 2015-02-25 NOTE — Progress Notes (Signed)
Peripherally Inserted Central Catheter/Midline Placement  The IV Nurse has discussed with the patient and/or persons authorized to consent for the patient, the purpose of this procedure and the potential benefits and risks involved with this procedure.  The benefits include less needle sticks, lab draws from the catheter and patient may be discharged home with the catheter.  Risks include, but not limited to, infection, bleeding, blood clot (thrombus formation), and puncture of an artery; nerve damage and irregular heat beat.  Alternatives to this procedure were also discussed.  PICC/Midline Placement Documentation  PICC / Midline Single Lumen 28/31/51 PICC Right Basilic 39 cm 0 cm (Active)  Indication for Insertion or Continuance of Line Vasoactive infusions 02/25/2015  9:00 AM  Exposed Catheter (cm) 0 cm 02/25/2015  9:00 AM  Site Assessment Clean;Dry;Intact 02/25/2015  9:00 AM  Line Status Flushed;Saline locked;Blood return noted 02/25/2015  9:00 AM  Dressing Type Transparent 02/25/2015  9:00 AM  Dressing Status Clean;Dry;Intact;Antimicrobial disc in place 02/25/2015  9:00 AM  Line Care Connections checked and tightened 02/25/2015  9:00 AM  Dressing Intervention New dressing 02/25/2015  9:00 AM  Dressing Change Due 03/04/15 02/25/2015  9:00 AM       Hillery Jacks 02/25/2015, 9:44 AM

## 2015-02-25 NOTE — Discharge Instructions (Signed)
Peripherally Inserted Central Catheter/Midline Placement  The IV Nurse has discussed with the patient and/or persons authorized to consent for the patient, the purpose of this procedure and the potential benefits and risks involved with this procedure.  The benefits include less needle sticks, lab draws from the catheter and patient may be discharged home with the catheter.  Risks include, but not limited to, infection, bleeding, blood clot (thrombus formation), and puncture of an artery; nerve damage and irregular heat beat.  Alternatives to this procedure were also discussed.  PICC/Midline Placement Documentation  PICC / Midline Single Lumen 76/19/50 PICC Right Basilic 39 cm 0 cm (Active)  Indication for Insertion or Continuance of Line Vasoactive infusions 02/25/2015  9:00 AM  Exposed Catheter (cm) 0 cm 02/25/2015  9:00 AM  Site Assessment Clean;Dry;Intact 02/25/2015  9:00 AM  Line Status Flushed;Saline locked;Blood return noted 02/25/2015  9:00 AM  Dressing Type Transparent 02/25/2015  9:00 AM  Dressing Status Clean;Dry;Intact;Antimicrobial disc in place 02/25/2015  9:00 AM  Line Care Connections checked and tightened 02/25/2015  9:00 AM  Dressing Intervention New dressing 02/25/2015  9:00 AM  Dressing Change Due 03/04/15 02/25/2015  9:00 AM       Hillery Jacks 02/25/2015, 9:45 AM   PICC Insertion, Care After Refer to this sheet in the next few weeks. These instructions provide you with information on caring for yourself after your procedure. Your health care provider may also give you more specific instructions. Your treatment has been planned according to current medical practices, but problems sometimes occur. Call your health care provider if you have any problems or questions after your procedure. WHAT TO EXPECT AFTER THE PROCEDURE After your procedure, it is typical to have the following:  Mild discomfort at the insertion site. This should not last more than a day. HOME CARE  INSTRUCTIONS  Rest at home for the remainder of the day after the procedure.  You may bend your arm and move it freely. If your PICC is near or at the bend of your elbow, avoid activity with repeated motion at the elbow.  Avoid lifting heavy objects as instructed by your health care provider.  Avoid using a crutch with the arm on the same side as your PICC. You may need to use a walker. Bandage Care  Keep your PICC bandage (dressing) clean and dry to prevent infection.  Ask your health care provider when you may shower. To keep the dressing dry, cover the PICC with plastic wrap and tape before showering. If the dressing does become wet, replace it right after the shower.  Do not soak in the bath, swim, or use hot tubs when you have a PICC.  Change the PICC dressing as instructed by your health care provider.  Change your PICC dressing if it becomes loose or wet. General PICC Care  Check the PICC insertion site daily for leakage, redness, swelling, or pain.  Flush the PICC as directed by your health care provider. Let your health care provider know right away if the PICC is difficult to flush or does not flush. Do not use force to flush the PICC.  Do not use a syringe that is less than 10 mL to flush the PICC.  Never pull or tug on the PICC.  Avoid blood pressure checks on the arm with the PICC.  Keep your PICC identification card with you at all times.  Do not take the PICC out yourself. Only a trained health care professional should remove the  PICC. SEEK MEDICAL CARE IF:  You have pain in your arm, ear, face, or teeth.  You have fever or chills.  You have drainage from the PICC insertion site.  You have redness or palpate a "cord" around the PICC insertion site.  You cannot flush the catheter. SEEK IMMEDIATE MEDICAL CARE IF:  You have swelling in the arm in which the PICC is inserted. Document Released: 09/18/2013 Document Revised: 12/03/2013 Document Reviewed:  09/18/2013 [facility] Patient Information 2015 [place], [place]. This information is not intended to replace advice given to you by your health care provider. Make sure you discuss any questions you have with your health care provider.

## 2015-03-03 ENCOUNTER — Ambulatory Visit: Payer: BLUE CROSS/BLUE SHIELD | Admitting: Family

## 2015-03-09 ENCOUNTER — Telehealth (HOSPITAL_COMMUNITY): Payer: Self-pay | Admitting: *Deleted

## 2015-03-09 NOTE — Telephone Encounter (Signed)
Pt called stating she is having really bad panic attacks and would like for Dr. Sima Matas to call her back. Per pt she almost lost her foot and just need to talk to Dr. Sima Matas. Per pt she is not of any harm to herself or anyone else around her. Pt is aware that Dr. Sima Matas will be out of the office the last week of March. Pt number is [phone]. Husband number is [phone].

## 2015-03-10 ENCOUNTER — Ambulatory Visit (INDEPENDENT_AMBULATORY_CARE_PROVIDER_SITE_OTHER): Payer: BC Managed Care – PPO | Admitting: Nurse Practitioner

## 2015-03-10 ENCOUNTER — Encounter: Payer: Self-pay | Admitting: Nurse Practitioner

## 2015-03-10 VITALS — BP 135/83 | HR 82 | Ht 69.0 in | Wt 192.8 lb

## 2015-03-10 DIAGNOSIS — E1140 Type 2 diabetes mellitus with diabetic neuropathy, unspecified: Secondary | ICD-10-CM

## 2015-03-10 DIAGNOSIS — G4710 Hypersomnia, unspecified: Secondary | ICD-10-CM | POA: Diagnosis not present

## 2015-03-10 DIAGNOSIS — G609 Hereditary and idiopathic neuropathy, unspecified: Secondary | ICD-10-CM

## 2015-03-10 DIAGNOSIS — G2581 Restless legs syndrome: Secondary | ICD-10-CM | POA: Diagnosis not present

## 2015-03-10 DIAGNOSIS — R400 Somnolence: Secondary | ICD-10-CM

## 2015-03-10 MED ORDER — OXCARBAZEPINE 150 MG PO TABS: ORAL_TABLET | ORAL | Status: DC

## 2015-03-10 MED ORDER — CARBIDOPA-LEVODOPA 25-100 MG PO TABS: 1.0000 | ORAL_TABLET | Freq: Three times a day (TID) | ORAL | Status: DC

## 2015-03-10 MED ORDER — PREGABALIN 150 MG PO CAPS: 150.0000 mg | ORAL_CAPSULE | Freq: Three times a day (TID) | ORAL | Status: DC

## 2015-03-10 NOTE — Patient Instructions (Addendum)
Continue Trileptal at current dose will refill Continue Lyrica at current dose will refill Continue Sinemet at current dose will refill Will order sleep study Follow-up in 6-8 months

## 2015-03-10 NOTE — Progress Notes (Addendum)
[facility]  PATIENT: Rhonda Thomas DOB: [birth year]   REASON FOR VISIT: Follow-up for paresthesias of the feet, restless legs syndrome, new complaint of daytime drowsiness HISTORY FROM: Patient    HISTORY OF PRESENT ILLNESS:Rhonda Thomas, 59 year old female returns for followup. She was last seen 06/16/14. She has a history of paresthesias that the restless leg syndrome. She  is still taking Lyrica 150 mg 3 times a day, which helps her symptoms some, she is also taking Sinemet. In addition she takes Trileptal (helped her sensation of pulling in the feet since last seen she has had an infection of her feet on the plantar surface. She went to the wound care center for several months and has been on several rounds of IV antibiotics. She remains on IV antibiotics at this time. She has been out of work since December for this. Most recent HgB A1C 6.9. She sees Dr. Irving Shows who is a pain specialist. She has a new complaint of daytime drowsiness today, her ESS score is noted to be elevated at 12. She returns for reevaluation   HISTORY:bilateral feet paresthesia and restless leg syndrome.  She also has history of insulin dependent DM for more than 13 years now, around the time of diagnosis, she noticed bilateral plantar feet and toes numbness, tingling, slow progression over the years, now spreading to whole feet. She complains of burning, tingling, some time knife stabbing pain from bottom of her feet upwards, increased after bearing weight.  Her DM was under suboptimal control, A1C 7.8, now on insulin, she is taking lyrica 150mg  bid, only mild improvement, high copay.  She denies bialteral lower extremity weakness, bilateral hands paresthesia, no incontinence.  EEG/[place] abnormal study. There is electrodiagnostic evidence of mild to moderate length-dependent axonal sensorimotor polyneuropathy. There is also evidence of mild to moderate bilateral carpal tunnel syndromes. There is no evidence  of right lumbar sacral radiculopathy.  She had right second toe amputation due to infection, she still has bilateral third toe swelling from recent correction surgery, continues to complain of bilateral feet paresthesias, she has restless leg symptoms, difficulty holding his feet and leg still when trying to sleep, she has tried Requip, causing worsening symptoms,  She tried the cream ordered for neuropathy but had itching, amitriptyline cause bug crawling sensation in her leg. Requip had made her symptoms worse.     REVIEW OF SYSTEMS: Full 14 system review of systems performed and notable only for those listed, all others are neg:  Constitutional: neg  Cardiovascular: neg Ear/Nose/Throat: neg  Skin: neg Eyes: Blurred vision Respiratory: neg Gastroitestinal: neg  Hematology/Lymphatic: neg  Endocrine: neg Musculoskeletal:neg Allergy/Immunology: neg Neurological: Paresthesias Psychiatric: neg Sleep : Restless legs, daytime drowsiness   ALLERGIES: Allergies  Allergen Reactions  . Actos [Pioglitazone]     Edema   . Invokana [Canagliflozin]     vaginitis    HOME MEDICATIONS: Outpatient Prescriptions Prior to Visit  Medication Sig Dispense Refill  . carbidopa-levodopa (SINEMET IR) 25-100 MG per tablet Take 1 tablet by mouth 3 (three) times daily. 90 tablet 6  . Dulaglutide (TRULICITY) 1.5 YT/0.1SW SOPN Inject 1.5 mg into the skin once a week. 2 mL 2  . estrogens, conjugated, (PREMARIN) 1.25 MG tablet Take 1 tablet (1.25 mg total) by mouth daily. (Patient taking differently: Take 1.25 mg by mouth daily as needed. ) 30 tablet 11  . HYDROcodone-acetaminophen (NORCO) 10-325 MG per tablet Take 1-2 tablets by mouth every 4 (four) hours as needed.     Marland Kitchen  insulin aspart (NOVOLOG FLEXPEN) 100 UNIT/ML FlexPen Inject 3-5 Units into the skin 3 (three) times daily with meals. And pen needles 4/day 15 mL 11  . Insulin Glargine (LANTUS SOLOSTAR) 100 UNIT/ML Solostar Pen Inject 20 units daily.  APPOINTMENT NEEDED FOR FURTHER REFILLS 15 mL 0  . insulin glargine (LANTUS) 100 UNIT/ML injection Inject 18 Units into the skin at bedtime.  15 mL 11  . LYRICA 150 MG capsule TAKE 1 CAPSULE BY MOUTH THREE TIMES DAILY 90 capsule 0  . metFORMIN (GLUCOPHAGE-XR) 500 MG 24 hr tablet TAKE 1 TABLET BY MOUTH TWICE DAILY 60 tablet 0  . ONETOUCH VERIO test strip USE TO TEST BLOOD GLUCOSE TWICE DAILY 100 each 2  . ramipril (ALTACE) 5 MG capsule Take 1 capsule (5 mg total) by mouth daily. 30 capsule 11  . UNIFINE PENTIPS 31G X 6 MM MISC     . zolpidem (AMBIEN) 10 MG tablet TAKE 1/2 - 1 TABLET BY MOUTH AT BEDTIME AS NEEDED FOR SLEEP 30 tablet 2  . ALPRAZolam (XANAX) 0.5 MG tablet Take 1 tablet (0.5 mg total) by mouth at bedtime as needed for anxiety. (Patient not taking: Reported on 01/22/2015) 30 tablet 0  . ciprofloxacin (CIPRO) 500 MG tablet     . clindamycin (CLEOCIN) 150 MG capsule Take 300 mg by mouth 2 (two) times daily.    . clotrimazole (MYCELEX) 10 MG troche Take 1 tablet (10 mg total) by mouth 5 (five) times daily. (Patient not taking: Reported on 03/10/2015) 35 tablet 0  . Lancets (ONETOUCH ULTRASOFT) lancets CHECK DAILY AND AS NEEDED 100 each 3  . lisinopril (ZESTRIL) 10 MG tablet Take 1 tablet (10 mg total) by mouth daily. (Patient not taking: Reported on 01/16/2015) 30 tablet 5  . OXcarbazepine (TRILEPTAL) 150 MG tablet 1 in the am 2 caps at hs (Patient not taking: Reported on 03/10/2015) 90 tablet 6  . pseudoephedrine-guaifenesin (MUCINEX D) 60-600 MG per tablet Take 1 tablet by mouth every 12 (twelve) hours. (Patient not taking: Reported on 03/10/2015) 20 tablet 11  . sertraline (ZOLOFT) 50 MG tablet Take 1 tablet (50 mg total) by mouth daily. (Patient not taking: Reported on 03/10/2015) 30 tablet 3   No facility-administered medications prior to visit.    PAST MEDICAL HISTORY: Past Medical History  Diagnosis Date  . Diabetes   . Anxiety   . Movement disorder   . Neuropathy   . Attention  deficit disorder (ADD)     PAST SURGICAL HISTORY: Past Surgical History  Procedure Laterality Date  . Cholecystectomy    . Tubal ligation    . Abdominal hysterectomy    . Toe amputation Right     2nd toe rt foot    FAMILY HISTORY: Family History  Problem Relation Age of Onset  . Diabetes    . Heart Problems    . Stroke Mother     SOCIAL HISTORY: History   Social History  . Marital Status: Married    Spouse Name: [name]  . Number of Children: 3  . Years of Education: college   Occupational History  .      [employer]   Social History Main Topics  . Smoking status: Never Smoker   . Smokeless tobacco: Never Used  . Alcohol Use: No  . Drug Use: No  . Sexual Activity: Not on file   Other Topics Concern  . Not on file   Social History Narrative   Patient lives at home with her  husband. [name]).   Patient works for [employer].    Education- College   Right handed.   Caffeine- tea one cup daily.           PHYSICAL EXAM  Filed Vitals:   03/10/15 1020  BP: 135/83  Pulse: 82  Height: 5\' 9"  (1.753 m)  Weight: 192 lb 12.8 oz (87.454 kg)   Body mass index is 28.46 kg/(m^2). Generalized: Well developed, in no acute distress  Head: normocephalic and atraumatic,. Oropharynx benign [place] 3. Neck: Supple, no carotid bruits, neck size 16.5 inches  Cardiac: Regular rate rhythm, no murmur  Musculoskeletal: Right second toe amputation,  Neurological examination  Mentation: Alert oriented to time, place, history taking. Follows all commands speech and language fluent ESS 12, FSS 32 Cranial nerve II-XII: Pupils were equal round reactive to light extraocular movements were full, visual field were full on confrontational test. Facial sensation and strength were normal. hearing was intact to finger rubbing bilaterally. Uvula tongue midline. head turning and shoulder shrug were normal and symmetric.Tongue protrusion into cheek  strength was normal.  Motor: normal bulk and tone, full strength in the BUE, BLE, No focal weakness  Sensory: Length dependent decreased light touch, pinprick to upper shin, decreased proprioception and decreased vibratory sensation to the ankle level  Coordination: finger-nose-finger, heel-to-shin bilaterally, no dysmetria  Reflexes: Brachioradialis 2/2, biceps 2/2, triceps 2/2, patellar 2/2, Achilles absent plantar responses were flexor bilaterally.  Gait and Station: Rising up from seated position without assistance, normal stance, moderate stride, good arm swing, smooth turning, able to perform tiptoe, and heel walking without difficulty. Tandem gait is mildly unsteady  DIAGNOSTIC DATA (LABS, IMAGING, TESTING) - I reviewed patient records, labs, notes, testing and imaging myself where available.  Lab Results  Component Value Date   WBC 6.1 12/05/2014   HGB 12.1 12/05/2014   HCT 37.7 12/05/2014   MCV 90.8 12/05/2014   PLT 255 12/05/2014      Component Value Date/Time   NA 141 01/07/2015 1101   NA 139 12/05/2014 2015   K 4.9 01/07/2015 1101   CL 99 01/07/2015 1101   CO2 28 01/07/2015 1101   GLUCOSE 143* 01/07/2015 1101   GLUCOSE 106* 12/05/2014 2015   BUN 11 01/07/2015 1101   BUN 14 12/05/2014 2015   CREATININE 1.25* 01/07/2015 1101   CREATININE 0.69 06/21/2013 0931   CALCIUM 9.1 01/07/2015 1101   PROT 6.6 01/07/2015 1101   PROT 6.5 06/21/2013 0931   ALBUMIN 4.1 06/21/2013 0931   AST 8 01/07/2015 1101   ALT 11 01/07/2015 1101   ALKPHOS 79 01/07/2015 1101   BILITOT 0.2 01/07/2015 1101   GFRNONAA 48* 01/07/2015 1101   GFRNONAA >89 06/21/2013 0931   GFRAA 55* 01/07/2015 1101   GFRAA >89 06/21/2013 0931   Lab Results  Component Value Date   CHOL 190 09/17/2014   HDL 45 09/17/2014   LDLCALC 85 09/17/2014   LDLDIRECT 83 09/17/2014   TRIG 302* 09/17/2014   CHOLHDL 4.2 09/17/2014   Lab Results  Component Value Date   HGBA1C 6.6 08/15/2014    ASSESSMENT AND  PLAN  59 y.o. year old female  has a past medical history of  Movement disorder; Neuropathy; and new complaint of daytime drowsiness. Her restless legs and neuropathy are in fairly good control with Sinemet Trileptal and Lyrica. She needs refills. She will be evaluated by sleep study for her daytime drowsiness. The patient is a current patient of Dr. Krista Blue  who is  out of the office today . This note is sent to the work in doctor.     Continue Trileptal at current dose will refill Continue Lyrica at current dose will refill Continue Sinemet at current dose will refill Will order sleep study to evaluate daytime sleepiness, snoring etc Follow-up in 6-8 months Dennie Bible, [facility], [facility], APRN  [facility] [address] [place], [place] [address] [phone]  Personally examined patient and images, and have documentes history, physical, neuro exam,assessment and plan as stated above.   Sarina Ill, MD Stroke Neurology [phone] [facility]  .

## 2015-03-12 ENCOUNTER — Telehealth: Payer: Self-pay | Admitting: Neurology

## 2015-03-12 DIAGNOSIS — G2581 Restless legs syndrome: Secondary | ICD-10-CM

## 2015-03-12 DIAGNOSIS — G4719 Other hypersomnia: Secondary | ICD-10-CM

## 2015-03-12 DIAGNOSIS — R0683 Snoring: Secondary | ICD-10-CM

## 2015-03-12 DIAGNOSIS — E663 Overweight: Secondary | ICD-10-CM

## 2015-03-12 DIAGNOSIS — G629 Polyneuropathy, unspecified: Secondary | ICD-10-CM

## 2015-03-12 NOTE — Telephone Encounter (Signed)
Rhonda Jefferson, NP refers patient for attended sleep study.  Height: 5'9"  Weight: 192 lb 12.8 oz  BMI: 28.46  Past Medical History:  Diabetes    . Anxiety   . Movement disorder   . Neuropathy   . Attention deficit disorder (ADD)             Sleep Symptoms: Restless legs, daytime drowsiness, snoring   Epworth Score: ESS (12)  Medication:  ALPRAZolam (Tab) XANAX 0.5 MG Take 1 tablet (0.5 mg total) by mouth at bedtime as needed for anxiety.       Carbidopa-Levodopa (Tab) SINEMET IR 25-100 MG Take 1 tablet by mouth 3 (three) times daily.      Ciprofloxacin HCl (Tab) CIPRO 500 MG       Clindamycin HCl (Cap) CLEOCIN 150 MG Take 300 mg by mouth 2 (two) times daily.      Clotrimazole (Troche) MYCELEX 10 MG Take 1 tablet (10 mg total) by mouth 5 (five) times daily.      Dulaglutide (Solution Pen-injector) Dulaglutide 1.5 MG/0.5ML Inject 1.5 mg into the skin once a week.      Estrogens Conjugated (Tab) PREMARIN 1.25 MG Take 1 tablet (1.25 mg total) by mouth daily.      Glucose Blood (Strip) ONETOUCH VERIO  USE TO TEST BLOOD GLUCOSE TWICE DAILY      Hydrocodone-Acetaminophen (Tab) NORCO 10-325 MG Take 1-2 tablets by mouth every 4 (four) hours as needed.       Insulin Aspart (Solution Pen-injector) NOVOLOG 100 UNIT/ML Inject 3-5 Units into the skin 3 (three) times daily with meals. And pen needles 4/day      Insulin Glargine (Solution) LANTUS 100 UNIT/ML Inject 18 Units into the skin at bedtime.       Insulin Glargine (Solution Pen-injector) LANTUS 100 UNIT/ML Inject 20 units daily. APPOINTMENT NEEDED FOR FURTHER REFILLS      Insulin Pen Needle (Misc) UNIFINE PENTIPS 31G X 6 MM       Lancets (Misc) onetouch ultrasoft  CHECK DAILY AND AS NEEDED      Lisinopril (Tab) PRINIVIL,ZESTRIL 10 MG Take 1 tablet (10 mg total) by mouth daily.      MetFORMIN HCl (Tablet SR 24 hr) GLUCOPHAGE-XR 500 MG TAKE 1 TABLET BY MOUTH TWICE DAILY       OXcarbazepine (Tab) TRILEPTAL 150 MG 1 in the am 2 caps at hs      Pregabalin (Cap) LYRICA 150 MG Take 1 capsule (150 mg total) by mouth 3 (three) times daily.      Pseudoephedrine-Guaifenesin (Tablet SR 12 hr) MUCINEX D 60-600 MG Take 1 tablet by mouth every 12 (twelve) hours.      Ramipril (Cap) ALTACE 5 MG Take 1 capsule (5 mg total) by mouth daily.      Sertraline HCl (Tab) ZOLOFT 50 MG Take 1 tablet (50 mg total) by mouth daily.      Zolpidem Tartrate (Tab) AMBIEN 10 MG TAKE 1/2 - 1 TABLET BY MOUTH AT BEDTIME AS NEEDED FOR SLEEP      vancomycin 1,000 mg in sodium chloride 0.9 % 250 mL   Inject 1,000 mg into the vein every 12 (twelve) hours.        Ins: BCBS   Assessment & Plan: 59 y.o. year old female has a past medical history of Movement disorder; Neuropathy; and new complaint of daytime drowsiness. Her restless legs and neuropathy are in fairly good control with Sinemet Trileptal and Lyrica. She needs refills. She will be  evaluated by sleep study for her daytime drowsiness. The patient is a current patient of Dr. Krista Blue who is out of the office today . This note is sent to the work in doctor.   Continue Trileptal at current dose will refill Continue Lyrica at current dose will refill Continue Sinemet at current dose will refill Will order sleep study to evaluate daytime sleepiness, snoring etc Follow-up in 6-8 months  Please review patient information and submit instructions for scheduling and orders for sleep technologist. Thank you.

## 2015-03-18 NOTE — Telephone Encounter (Signed)
In House Sleep study request review: This patient has an underlying medical history of restless leg syndrome, overweight state, neuropathy, and anxiety and is referred by Cecille Rubin, nurse practitioner, and Dr. Krista Blue for an attended sleep study due to a report of excessive daytime somnolence and snoring. I will order a split-night sleep study and see the patient in sleep medicine consultation afterwards as appropriate. Please print this note and attach to sleep study chart/package.   Sleep Acquisition Technologist instructions: Please score at 3% and split if 2 hour estimated AHI >15/h, unless mandated otherwise by the insurance carrier.    Star Age, MD, PhD [facility])

## 2015-03-21 ENCOUNTER — Other Ambulatory Visit: Payer: Self-pay | Admitting: Family Medicine

## 2015-03-25 ENCOUNTER — Other Ambulatory Visit: Payer: Self-pay | Admitting: Family Medicine

## 2015-03-26 ENCOUNTER — Telehealth: Payer: Self-pay | Admitting: Family Medicine

## 2015-03-26 NOTE — Telephone Encounter (Signed)
Patient aware that we do not have any coupons of lantus or novolog

## 2015-04-02 ENCOUNTER — Ambulatory Visit (INDEPENDENT_AMBULATORY_CARE_PROVIDER_SITE_OTHER): Payer: BC Managed Care – PPO | Admitting: Psychology

## 2015-04-02 DIAGNOSIS — F332 Major depressive disorder, recurrent severe without psychotic features: Secondary | ICD-10-CM | POA: Diagnosis not present

## 2015-04-02 DIAGNOSIS — F419 Anxiety disorder, unspecified: Secondary | ICD-10-CM

## 2015-04-21 ENCOUNTER — Ambulatory Visit (INDEPENDENT_AMBULATORY_CARE_PROVIDER_SITE_OTHER): Payer: BC Managed Care – PPO | Admitting: Family Medicine

## 2015-04-21 ENCOUNTER — Ambulatory Visit (INDEPENDENT_AMBULATORY_CARE_PROVIDER_SITE_OTHER): Payer: BC Managed Care – PPO | Admitting: Neurology

## 2015-04-21 ENCOUNTER — Encounter: Payer: Self-pay | Admitting: Family Medicine

## 2015-04-21 VITALS — BP 127/76 | HR 78 | Temp 97.7°F | Ht 69.0 in | Wt 191.0 lb

## 2015-04-21 DIAGNOSIS — K219 Gastro-esophageal reflux disease without esophagitis: Secondary | ICD-10-CM

## 2015-04-21 DIAGNOSIS — R1310 Dysphagia, unspecified: Secondary | ICD-10-CM | POA: Diagnosis not present

## 2015-04-21 DIAGNOSIS — G4710 Hypersomnia, unspecified: Secondary | ICD-10-CM

## 2015-04-21 DIAGNOSIS — G479 Sleep disorder, unspecified: Secondary | ICD-10-CM

## 2015-04-21 DIAGNOSIS — G4733 Obstructive sleep apnea (adult) (pediatric): Secondary | ICD-10-CM | POA: Diagnosis not present

## 2015-04-21 DIAGNOSIS — G4761 Periodic limb movement disorder: Secondary | ICD-10-CM

## 2015-04-21 DIAGNOSIS — G4730 Sleep apnea, unspecified: Secondary | ICD-10-CM

## 2015-04-21 LAB — POCT CBC
Granulocyte percent: 76.2 %G (ref 37–80)
HCT, POC: 42.8 % (ref 37.7–47.9)
Hemoglobin: 13.4 g/dL (ref 12.2–16.2)
Lymph, poc: 1.6 (ref 0.6–3.4)
MCH, POC: 27.4 pg (ref 27–31.2)
MCHC: 31.2 g/dL — AB (ref 31.8–35.4)
MCV: 87.9 fL (ref 80–97)
MPV: 7.6 fL (ref 0–99.8)
POC Granulocyte: 6.6 (ref 2–6.9)
POC LYMPH PERCENT: 18.5 %L (ref 10–50)
Platelet Count, POC: 223 10*3/uL (ref 142–424)
RBC: 4.87 M/uL (ref 4.04–5.48)
RDW, POC: 13.6 %
WBC: 8.7 10*3/uL (ref 4.6–10.2)

## 2015-04-21 MED ORDER — DULAGLUTIDE 1.5 MG/0.5ML ~~LOC~~ SOAJ: 1.5000 mg | SUBCUTANEOUS | Status: DC

## 2015-04-21 MED ORDER — OMEPRAZOLE 40 MG PO CPDR: 40.0000 mg | DELAYED_RELEASE_CAPSULE | Freq: Every day | ORAL | Status: DC

## 2015-04-21 NOTE — Addendum Note (Signed)
Addended by: Zannie Cove on: 04/21/2015 11:41 AM   Modules accepted: Orders

## 2015-04-21 NOTE — Patient Instructions (Signed)
We will call you with the lab work results once those results become available Please return the FOBT We will arrange for you to see a gastroenterologist for a possible endoscopy Please take the omeprazole regularly until you see him Continue to avoid caffeine, NSAIDs, and alcohol.

## 2015-04-21 NOTE — Progress Notes (Signed)
Subjective:    Patient ID: Rhonda Thomas, female    DOB: July 09, 1956, 59 y.o.   MRN: [id number]  HPI Patient here today for trouble swallowing. This is been going on for a couple of months or since the end of January. At that time she has some kind of bone infection and had to take a lot of antibiotics. She has had more trouble swallowing since then. She denies the intake of NSAIDs, alcohol, or caffeine except an occasional glass of tea. She has minimal heartburn. Her stools of the normal color and she's not seen any sign of any blood in the stool. She has had an endoscopy in the past but this was years ago. She denies any chest pain shortness of breath or cough. Please see scanned in handwritten note brought to the visit.      Patient Active Problem List   Diagnosis Date Noted  . Somnolence, daytime 03/10/2015  . Generalized anxiety disorder 06/21/2013  . Attention deficit disorder without mention of hyperactivity 06/21/2013  . Hyperlipidemia 06/21/2013  . Insomnia 06/21/2013  . Restless legs syndrome (RLS) 06/20/2013  . Type 2 diabetes mellitus with diabetic neuropathy 06/20/2013  . Hereditary and idiopathic peripheral neuropathy 06/20/2013   Outpatient Encounter Prescriptions as of 04/21/2015  Medication Sig  . ALPRAZolam (XANAX) 0.5 MG tablet Take 1 tablet (0.5 mg total) by mouth at bedtime as needed for anxiety.  . carbidopa-levodopa (SINEMET IR) 25-100 MG per tablet Take 1 tablet by mouth 3 (three) times daily.  . Dulaglutide (TRULICITY) 1.5 TK/1.6WF SOPN Inject 1.5 mg into the skin once a week.  . estrogens, conjugated, (PREMARIN) 1.25 MG tablet Take 1 tablet (1.25 mg total) by mouth daily. (Patient taking differently: Take 1.25 mg by mouth daily as needed. )  . HYDROcodone-acetaminophen (NORCO) 10-325 MG per tablet Take 1-2 tablets by mouth every 4 (four) hours as needed.   . insulin aspart (NOVOLOG FLEXPEN) 100 UNIT/ML FlexPen Inject 3-5 Units into the skin 3 (three) times daily  with meals. And pen needles 4/day  . Lancets (ONETOUCH ULTRASOFT) lancets CHECK DAILY AND AS NEEDED  . LANTUS SOLOSTAR 100 UNIT/ML Solostar Pen INJECT 20 UNITS DAILY. APPOINTMENT NEEDED FOR FURTHER REFILLS  . metFORMIN (GLUCOPHAGE-XR) 500 MG 24 hr tablet TAKE 1 TABLET BY MOUTH TWICE DAILY  . ONETOUCH VERIO test strip USE TO TEST BLOOD GLUCOSE TWICE DAILY  . OXcarbazepine (TRILEPTAL) 150 MG tablet 1 in the am 2 caps at hs  . pregabalin (LYRICA) 150 MG capsule Take 1 capsule (150 mg total) by mouth 3 (three) times daily.  Marland Kitchen UNIFINE PENTIPS 31G X 6 MM MISC   . zolpidem (AMBIEN) 10 MG tablet TAKE 1/2 - 1 TABLET BY MOUTH AT BEDTIME AS NEEDED FOR SLEEP  . [DISCONTINUED] insulin glargine (LANTUS) 100 UNIT/ML injection Inject 18 Units into the skin at bedtime.   . [DISCONTINUED] ciprofloxacin (CIPRO) 500 MG tablet   . [DISCONTINUED] clindamycin (CLEOCIN) 150 MG capsule Take 300 mg by mouth 2 (two) times daily.  . [DISCONTINUED] clotrimazole (MYCELEX) 10 MG troche Take 1 tablet (10 mg total) by mouth 5 (five) times daily. (Patient not taking: Reported on 03/10/2015)  . [DISCONTINUED] lisinopril (ZESTRIL) 10 MG tablet Take 1 tablet (10 mg total) by mouth daily. (Patient not taking: Reported on 01/16/2015)  . [DISCONTINUED] pseudoephedrine-guaifenesin (MUCINEX D) 60-600 MG per tablet Take 1 tablet by mouth every 12 (twelve) hours. (Patient not taking: Reported on 03/10/2015)  . [DISCONTINUED] ramipril (ALTACE) 5 MG capsule Take 1 capsule (  5 mg total) by mouth daily.  . [DISCONTINUED] sertraline (ZOLOFT) 50 MG tablet Take 1 tablet (50 mg total) by mouth daily. (Patient not taking: Reported on 03/10/2015)  . [DISCONTINUED] vancomycin 1,000 mg in sodium chloride 0.9 % 250 mL Inject 1,000 mg into the vein every 12 (twelve) hours.   No facility-administered encounter medications on file as of 04/21/2015.     Review of Systems  Constitutional: Negative.   HENT: Negative.   Eyes: Negative.   Respiratory: Negative.    Cardiovascular: Negative.   Gastrointestinal: Negative.        Trouble swallowing  Endocrine: Negative.   Genitourinary: Negative.   Musculoskeletal: Negative.   Skin: Negative.   Allergic/Immunologic: Negative.   Neurological: Negative.   Hematological: Negative.   Psychiatric/Behavioral: Negative.        Objective:   Physical Exam  Constitutional: She is oriented to person, place, and time. [name]. No distress.  HENT:  Head: Normocephalic and atraumatic.  Eyes: Conjunctivae and EOM are normal. Pupils are equal, round, and reactive to light. Right eye exhibits no discharge. Left eye exhibits no discharge. No scleral icterus.  Neck: Normal range of motion. Neck supple. No thyromegaly present.  Cardiovascular: Normal rate, regular rhythm and normal heart sounds.   No murmur heard. Pulmonary/Chest: Effort normal and breath sounds normal. She has no wheezes. She has no rales.  Abdominal: Soft. Bowel sounds are normal. She exhibits no mass. There is tenderness. There is no rebound and no guarding.  Minimal epigastric tenderness  Musculoskeletal: Normal range of motion. She exhibits no edema.  Lymphadenopathy:    She has no cervical adenopathy.  Neurological: She is alert and oriented to person, place, and time.  Skin: Skin is warm and dry. No rash noted.  Psychiatric: She has a normal mood and affect. [name]. Judgment and thought content normal.  Nursing note and vitals reviewed.  BP 127/76 mmHg  Pulse 78  Temp(Src) 97.7 F (36.5 C) (Oral)  Ht 5\' 9"  (1.753 m)  Wt 191 lb (86.637 kg)  BMI 28.19 kg/m2        Assessment & Plan:  1. Gastroesophageal reflux disease, esophagitis presence not specified -Take omeprazole as directed one half hour before breakfast every day and follow diet as directed -We will get a CBC, BMP, and LFTs  2. Difficulty swallowing -We will arrange for you to see the gastroenterologist to  further evaluate this swallowing problems  Meds ordered this encounter  Medications  . Dulaglutide (TRULICITY) 1.5 BB/0.4UG SOPN    Sig: Inject 1.5 mg into the skin once a week.    Dispense:  2 mL    Refill:  6  . omeprazole (PRILOSEC) 40 MG capsule    Sig: Take 1 capsule (40 mg total) by mouth daily.    Dispense:  30 capsule    Refill:  3   Patient Instructions  We will call you with the lab work results once those results become available Please return the FOBT We will arrange for you to see a gastroenterologist for a possible endoscopy Please take the omeprazole regularly until you see him Continue to avoid caffeine, NSAIDs, and alcohol.   Arrie Senate MD

## 2015-04-22 ENCOUNTER — Other Ambulatory Visit: Payer: Self-pay

## 2015-04-22 LAB — BMP8+EGFR
BUN/Creatinine Ratio: 13 (ref 9–23)
BUN: 10 mg/dL (ref 6–24)
CO2: 24 mmol/L (ref 18–29)
Calcium: 9.1 mg/dL (ref 8.7–10.2)
Chloride: 101 mmol/L (ref 97–108)
Creatinine, Ser: 0.76 mg/dL (ref 0.57–1.00)
GFR calc Af Amer: 100 mL/min/{1.73_m2} (ref >59)
GFR calc non Af Amer: 87 mL/min/{1.73_m2} (ref >59)
Glucose: 119 mg/dL — ABNORMAL HIGH (ref 65–99)
Potassium: 4.5 mmol/L (ref 3.5–5.2)
Sodium: 143 mmol/L (ref 134–144)

## 2015-04-22 LAB — HEPATIC FUNCTION PANEL
ALT: 16 IU/L (ref 0–32)
AST: 21 IU/L (ref 0–40)
Albumin: 4.1 g/dL (ref 3.5–5.5)
Alkaline Phosphatase: 73 IU/L (ref 39–117)
Bilirubin Total: <0.2 mg/dL (ref 0.0–1.2)
Bilirubin, Direct: 0.08 mg/dL (ref 0.00–0.40)
Total Protein: 6.3 g/dL (ref 6.0–8.5)

## 2015-04-22 MED ORDER — ZOLPIDEM TARTRATE 10 MG PO TABS: ORAL_TABLET | ORAL | Status: DC

## 2015-04-22 NOTE — Telephone Encounter (Signed)
Last seen 04/21/15 DW[facility]  If approved route to nurse to call into [facility]  [phone]

## 2015-04-22 NOTE — Sleep Study (Signed)
Please see the scanned sleep study interpretation located in the Procedure tab within the Chart Review section. 

## 2015-04-22 NOTE — Telephone Encounter (Signed)
Dr Livia Snellen- she is your regular pt

## 2015-04-24 NOTE — Telephone Encounter (Signed)
RX called into [facility]

## 2015-04-28 ENCOUNTER — Telehealth: Payer: Self-pay

## 2015-04-28 NOTE — Telephone Encounter (Signed)
Insurance approved prior authorization for Zolpidem Tartrate  04/07/15 to 04/27/16

## 2015-04-29 ENCOUNTER — Ambulatory Visit (INDEPENDENT_AMBULATORY_CARE_PROVIDER_SITE_OTHER): Payer: BC Managed Care – PPO | Admitting: Nurse Practitioner

## 2015-04-29 ENCOUNTER — Telehealth: Payer: Self-pay | Admitting: Neurology

## 2015-04-29 ENCOUNTER — Encounter: Payer: Self-pay | Admitting: Nurse Practitioner

## 2015-04-29 VITALS — BP 133/77 | HR 77 | Ht 69.0 in | Wt 195.4 lb

## 2015-04-29 DIAGNOSIS — G2581 Restless legs syndrome: Secondary | ICD-10-CM | POA: Diagnosis not present

## 2015-04-29 DIAGNOSIS — G609 Hereditary and idiopathic neuropathy, unspecified: Secondary | ICD-10-CM

## 2015-04-29 NOTE — Patient Instructions (Signed)
Continue previous meds Given Information on Charcot Lelan Pons Disorder F/U with Sleep regarding split test.

## 2015-04-29 NOTE — Progress Notes (Signed)
[facility]  PATIENT: Rhonda Thomas DOB: 1956-04-20   REASON FOR VISIT: follow up for neuropathy  HISTORY FROM: Patient    HISTORY OF PRESENT ILLNESS:Rhonda Thomas, 59 year old female returns for followup. She was last seen 03/10/15.  She has a history of paresthesias and restless leg syndrome. She is still taking Lyrica 150 mg 3 times a day, which helps her symptoms some, she is also taking Sinemet. In addition she takes Trileptal (helped her sensation of pulling in the feet since last seen she has had an infection of her feet on the plantar surface. She went to the wound care center for several months and has been on several rounds of IV antibiotics. Her IV antibiotics concluded the end of March. Her doctor is now telling her that she has Charcot Marie tooth disease. She has had toe amputations on the right foot .She has been out of work since December for this. Most recent HgB A1C 6.9. She sees Dr. Irving Shows who is a pain and foot specialist. She recently had her split study sleep test. She has not had her follow-up visit with Dr. Rexene Alberts. She is basically wanting information on Charcot-Marie-Tooth disease   HISTORY:bilateral feet paresthesia and restless leg syndrome.  She also has history of insulin dependent DM for more than 13 years now, around the time of diagnosis, she noticed bilateral plantar feet and toes numbness, tingling, slow progression over the years, now spreading to whole feet. She complains of burning, tingling, some time knife stabbing pain from bottom of her feet upwards, increased after bearing weight.  Her DM was under suboptimal control, A1C 7.8, now on insulin, she is taking lyrica 150mg  bid, only mild improvement, high copay.  She denies bialteral lower extremity weakness, bilateral hands paresthesia, no incontinence.  EEG/[place] abnormal study. There is electrodiagnostic evidence of mild to moderate length-dependent axonal sensorimotor polyneuropathy. There is  also evidence of mild to moderate bilateral carpal tunnel syndromes. There is no evidence of right lumbar sacral radiculopathy.  She had right second toe amputation due to infection, she still has bilateral third toe swelling from recent correction surgery, continues to complain of bilateral feet paresthesias, she has restless leg symptoms, difficulty holding his feet and leg still when trying to sleep, she has tried Requip, causing worsening symptoms,  She tried the cream ordered for neuropathy but had itching, amitriptyline cause bug crawling sensation in her leg. Requip had made her symptoms worse.     REVIEW OF SYSTEMS: Full 14 system review of systems performed and notable only for those listed, all others are neg:  Constitutional: neg  Cardiovascular: neg Ear/Nose/Throat: neg  Skin: neg Eyes: neg Respiratory: neg Gastroitestinal: neg  Hematology/Lymphatic: neg  Endocrine: neg Musculoskeletal:neg Allergy/Immunology: neg Neurological: neg Psychiatric: Depression Sleep : neg   ALLERGIES: Allergies  Allergen Reactions  . Actos [Pioglitazone]     Edema   . Invokana [Canagliflozin]     vaginitis    HOME MEDICATIONS: Outpatient Prescriptions Prior to Visit  Medication Sig Dispense Refill  . ALPRAZolam (XANAX) 0.5 MG tablet Take 1 tablet (0.5 mg total) by mouth at bedtime as needed for anxiety. 30 tablet 0  . carbidopa-levodopa (SINEMET IR) 25-100 MG per tablet Take 1 tablet by mouth 3 (three) times daily. 90 tablet 6  . Dulaglutide (TRULICITY) 1.5 YQ/0.3KV SOPN Inject 1.5 mg into the skin once a week. 2 mL 6  . estrogens, conjugated, (PREMARIN) 1.25 MG tablet Take 1 tablet (1.25 mg total) by mouth daily. (  Patient taking differently: Take 1.25 mg by mouth daily as needed. ) 30 tablet 11  . HYDROcodone-acetaminophen (NORCO) 10-325 MG per tablet Take 1-2 tablets by mouth every 4 (four) hours as needed.     . insulin aspart (NOVOLOG FLEXPEN) 100 UNIT/ML FlexPen Inject 3-5 Units  into the skin 3 (three) times daily with meals. And pen needles 4/day 15 mL 11  . Lancets (ONETOUCH ULTRASOFT) lancets CHECK DAILY AND AS NEEDED 100 each 3  . LANTUS SOLOSTAR 100 UNIT/ML Solostar Pen INJECT 20 UNITS DAILY. APPOINTMENT NEEDED FOR FURTHER REFILLS 15 mL 2  . metFORMIN (GLUCOPHAGE-XR) 500 MG 24 hr tablet TAKE 1 TABLET BY MOUTH TWICE DAILY 60 tablet 1  . omeprazole (PRILOSEC) 40 MG capsule Take 1 capsule (40 mg total) by mouth daily. 30 capsule 3  . ONETOUCH VERIO test strip USE TO TEST BLOOD GLUCOSE TWICE DAILY 100 each 2  . OXcarbazepine (TRILEPTAL) 150 MG tablet 1 in the am 2 caps at hs 90 tablet 6  . pregabalin (LYRICA) 150 MG capsule Take 1 capsule (150 mg total) by mouth 3 (three) times daily. 90 capsule 5  . UNIFINE PENTIPS 31G X 6 MM MISC     . zolpidem (AMBIEN) 10 MG tablet TAKE 1/2 - 1 TABLET BY MOUTH AT BEDTIME AS NEEDED FOR SLEEP 30 tablet 2   No facility-administered medications prior to visit.    PAST MEDICAL HISTORY: Past Medical History  Diagnosis Date  . Diabetes   . Anxiety   . Movement disorder   . Neuropathy   . Attention deficit disorder (ADD)     PAST SURGICAL HISTORY: Past Surgical History  Procedure Laterality Date  . Cholecystectomy    . Tubal ligation    . Abdominal hysterectomy    . Toe amputation Right     2nd toe rt foot    FAMILY HISTORY: Family History  Problem Relation Age of Onset  . Diabetes    . Heart Problems    . Stroke Mother     SOCIAL HISTORY: History   Social History  . Marital Status: Married    Spouse Name: [name]  . Number of Children: 3  . Years of Education: college   Occupational History  .      [employer]   Social History Main Topics  . Smoking status: Never Smoker   . Smokeless tobacco: Never Used  . Alcohol Use: No  . Drug Use: No  . Sexual Activity: Not on file   Other Topics Concern  . Not on file   Social History Narrative   Patient lives at home with her husband. [name]).    Patient works for [employer].    Education- College   Right handed.   Caffeine- tea one cup daily.           PHYSICAL EXAM  Filed Vitals:   04/29/15 1133  BP: 133/77  Pulse: 77  Height: 5\' 9"  (1.753 m)  Weight: 195 lb 6.4 oz (88.633 kg)   Body mass index is 28.84 kg/(m^2). Generalized: Well developed, in no acute distress  Head: normocephalic and atraumatic,. Oropharynx benign [place]opatti 3. Neck: Supple, no carotid bruits, neck size 16.5 inches  Cardiac: Regular rate rhythm, no murmur  Musculoskeletal: Right second toe amputation,  Neurological examination  Mentation: Alert oriented to time, place, history taking. Follows all commands speech and language fluent  Cranial nerve II-XII: Pupils were equal round reactive to light extraocular movements were full, visual field  were full on confrontational test. Facial sensation and strength were normal. hearing was intact to finger rubbing bilaterally. Uvula tongue midline. head turning and shoulder shrug were normal and symmetric.Tongue protrusion into cheek strength was normal.  Motor: normal bulk and tone, full strength in the BUE, BLE, No focal weakness  Sensory: Length dependent decreased light touch, pinprick to upper shin, decreased proprioception and decreased vibratory sensation to the ankle level  Coordination: finger-nose-finger, heel-to-shin bilaterally, no dysmetria  Reflexes: Brachioradialis 2/2, biceps 2/2, triceps 2/2, patellar 2/2, Achilles absent plantar responses were flexor bilaterally.  Gait and Station: Rising up from seated position without assistance, normal stance, moderate stride, good arm swing, smooth turning, able to perform tiptoe, and heel walking without difficulty. Tandem gait is mildly unsteady   DIAGNOSTIC DATA (LABS, IMAGING, TESTING) - I reviewed patient records, labs, notes, testing and imaging myself where available.  Lab Results  Component Value Date   WBC 8.7  04/21/2015   HGB 13.4 04/21/2015   HCT 42.8 04/21/2015   MCV 87.9 04/21/2015   PLT 255 12/05/2014      Component Value Date/Time   NA 143 04/21/2015 1137   NA 139 12/05/2014 2015   K 4.5 04/21/2015 1137   CL 101 04/21/2015 1137   CO2 24 04/21/2015 1137   GLUCOSE 119* 04/21/2015 1137   GLUCOSE 106* 12/05/2014 2015   BUN 10 04/21/2015 1137   BUN 14 12/05/2014 2015   CREATININE 0.76 04/21/2015 1137   CREATININE 0.69 06/21/2013 0931   CALCIUM 9.1 04/21/2015 1137   PROT 6.3 04/21/2015 1137   PROT 6.5 06/21/2013 0931   ALBUMIN 4.1 06/21/2013 0931   AST 21 04/21/2015 1137   ALT 16 04/21/2015 1137   ALKPHOS 73 04/21/2015 1137   BILITOT <0.2 04/21/2015 1137   BILITOT 0.2 01/07/2015 1101   GFRNONAA 87 04/21/2015 1137   GFRNONAA >89 06/21/2013 0931   GFRAA 100 04/21/2015 1137   GFRAA >89 06/21/2013 0931   Lab Results  Component Value Date   CHOL 190 09/17/2014   HDL 45 09/17/2014   LDLCALC 85 09/17/2014   LDLDIRECT 83 09/17/2014   TRIG 302* 09/17/2014   CHOLHDL 4.2 09/17/2014   Lab Results  Component Value Date   HGBA1C 6.6 08/15/2014    ASSESSMENT AND PLAN  59 y.o. year old female  has a past medical history of Diabetes; Anxiety; Movement disorder; Neuropathy; and Attention deficit disorder (ADD). here to follow-up. She has been given a new diagnosis from Dr. Irving Shows of Charcot Lelan Pons tooth disease and she is wanting to discuss this.  Continue previous meds Given Information on Charcot Marie tooth disorder F/U with Sleep regarding split test. Rhonda Thomas, [facility], [facility], APRN  [facility] [address] [place],  [address] [phone]

## 2015-04-29 NOTE — Progress Notes (Signed)
I have reviewed and agreed above plan. 

## 2015-04-29 NOTE — Telephone Encounter (Signed)
Pt came by office stating she had received voicemail from our office in reference to sleep study she had performed on 04/21/15. She was in the area and stopped by but would like to be contacted back with results and follow up.

## 2015-04-30 NOTE — Telephone Encounter (Signed)
I left message that I cannot see where anyone has called her about sleep study. But the study should be read soon and I will call her with results as soon as we get them.

## 2015-05-01 ENCOUNTER — Telehealth: Payer: Self-pay | Admitting: Neurology

## 2015-05-01 ENCOUNTER — Encounter: Payer: Self-pay | Admitting: Internal Medicine

## 2015-05-01 DIAGNOSIS — G4733 Obstructive sleep apnea (adult) (pediatric): Secondary | ICD-10-CM

## 2015-05-01 NOTE — Telephone Encounter (Signed)
Dr. Chip Thomas patient. Sleep study on 04/21/15.   Rhonda Thomas:   Please call and notify patient that the recent sleep study confirmed the diagnosis of moderate OSA. She did well with CPAP during the study with significant improvement of the respiratory events. Therefore, I would like start the patient on CPAP therapy at home by prescribing a machine for home use. I placed the order in the chart. The patient will need a follow up appointment with me in 8 to 10 weeks post set up that has to be scheduled; please go ahead and schedule while you have the patient on the phone and make sure patient understands the importance of keeping this window for the FU appointment, as it is often an insurance requirement and failing to adhere to this may result in losing coverage for sleep apnea treatment. 15 min follow-up should suffice, unless there is a 30 min FU slot available.  Please re-enforce the importance of compliance with treatment and the need for [name] to monitor compliance data - again an insurance requirement and good feedback for the patient as far as how they are doing.  Also remind patient, that any upcoming CPAP machine or mask issues, should be first addressed with the DME company. Please ask if patient has a preference regarding DME company.  Please arrange for CPAP set up at home through a DME company of patient's choice - once you have spoken to the patient - and faxed/routed report to PCP and referring MD (if other than PCP), you can close this encounter, thanks,   Star Age, MD, PhD [facility] ([facility] [place] [facility])
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
## 2015-05-04 NOTE — Telephone Encounter (Signed)
Left message to call back  

## 2015-05-06 ENCOUNTER — Encounter: Payer: Self-pay | Admitting: Family Medicine

## 2015-05-06 MED ORDER — CIPROFLOXACIN HCL 250 MG PO TABS: 250.0000 mg | ORAL_TABLET | Freq: Two times a day (BID) | ORAL | Status: DC

## 2015-05-06 NOTE — Telephone Encounter (Signed)
I have sent in a prescription for ciprofloxacin. If this does not clear things promptly previous follow-up in the office in the next few days. I hope your son does well with his surgery and life gets back to normal for you soon.  Best regards,  Claretta Fraise M.D.

## 2015-05-07 ENCOUNTER — Ambulatory Visit (AMBULATORY_SURGERY_CENTER): Payer: Self-pay | Admitting: *Deleted

## 2015-05-07 VITALS — Ht 69.0 in | Wt 193.0 lb

## 2015-05-07 DIAGNOSIS — K219 Gastro-esophageal reflux disease without esophagitis: Secondary | ICD-10-CM

## 2015-05-07 NOTE — Progress Notes (Signed)
No egg or soy allergy. No anesthesia problems.  No home O2.  No diet meds.  

## 2015-05-08 NOTE — Telephone Encounter (Signed)
I spoke to patient. She is aware of results. She would like to proceed with CPAP treatment. Lives in [place] and has [place]. I will fax orders out to [facility]. I will also fax sleep study to PCP. We were able to make f/u appt.

## 2015-05-18 ENCOUNTER — Ambulatory Visit (INDEPENDENT_AMBULATORY_CARE_PROVIDER_SITE_OTHER): Payer: BC Managed Care – PPO | Admitting: Psychology

## 2015-05-18 ENCOUNTER — Encounter (HOSPITAL_COMMUNITY): Payer: Self-pay | Admitting: Psychology

## 2015-05-18 DIAGNOSIS — F419 Anxiety disorder, unspecified: Secondary | ICD-10-CM | POA: Diagnosis not present

## 2015-05-18 DIAGNOSIS — F332 Major depressive disorder, recurrent severe without psychotic features: Secondary | ICD-10-CM | POA: Diagnosis not present
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
## 2015-05-22 ENCOUNTER — Encounter: Payer: Self-pay | Admitting: Internal Medicine

## 2015-05-22 ENCOUNTER — Ambulatory Visit (AMBULATORY_SURGERY_CENTER): Payer: BC Managed Care – PPO | Admitting: Internal Medicine

## 2015-05-22 VITALS — BP 152/92 | HR 68 | Temp 96.9°F | Resp 35 | Ht 69.0 in | Wt 193.0 lb

## 2015-05-22 DIAGNOSIS — K219 Gastro-esophageal reflux disease without esophagitis: Secondary | ICD-10-CM

## 2015-05-22 DIAGNOSIS — Q394 Esophageal web: Secondary | ICD-10-CM

## 2015-05-22 DIAGNOSIS — R1314 Dysphagia, pharyngoesophageal phase: Secondary | ICD-10-CM

## 2015-05-22 DIAGNOSIS — R1319 Other dysphagia: Secondary | ICD-10-CM

## 2015-05-22 DIAGNOSIS — K222 Esophageal obstruction: Secondary | ICD-10-CM

## 2015-05-22 DIAGNOSIS — R1310 Dysphagia, unspecified: Secondary | ICD-10-CM

## 2015-05-22 MED ORDER — SODIUM CHLORIDE 0.9 % IV SOLN: 500.0000 mL | INTRAVENOUS | Status: DC

## 2015-05-22 NOTE — Progress Notes (Signed)
A/ox3 pleased with MAC, report to Suzanne RN 

## 2015-05-22 NOTE — Patient Instructions (Addendum)
YOU HAD AN ENDOSCOPIC PROCEDURE TODAY AT [facility]:   Refer to the procedure report that was given to you for any specific questions about what was found during the examination.  If the procedure report does not answer your questions, please call your gastroenterologist to clarify.  If you requested that your care partner not be given the details of your procedure findings, then the procedure report has been included in a sealed envelope for you to review at your convenience later.  YOU SHOULD EXPECT: Some feelings of bloating in the abdomen. Passage of more gas than usual.  Walking can help get rid of the air that was put into your GI tract during the procedure and reduce the bloating. If you had a lower endoscopy (such as a colonoscopy or flexible sigmoidoscopy) you may notice spotting of blood in your stool or on the toilet paper. If you underwent a bowel prep for your procedure, you may not have a normal bowel movement for a few days.  Please Note:  You might notice some irritation and congestion in your nose or some drainage.  This is from the oxygen used during your procedure.  There is no need for concern and it should clear up in a day or so.  SYMPTOMS TO REPORT IMMEDIATELY:   Following lower endoscopy (colonoscopy or flexible sigmoidoscopy):  Excessive amounts of blood in the stool  Significant tenderness or worsening of abdominal pains  Swelling of the abdomen that is new, acute  Fever of 100F or higher   Following upper endoscopy (EGD)  Vomiting of blood or coffee ground material  New chest pain or pain under the shoulder blades  Painful or persistently difficult swallowing  New shortness of breath  Fever of 100F or higher  Black, tarry-looking stools  For urgent or emergent issues, a gastroenterologist can be reached at any hour by calling [phone].   DIET: Just clear liquids until 11:30 am, then you may have a soft diet for the rest of today.   Tomorrow you may have a regular diet.  Drink plenty of fluids but you should avoid alcoholic beverages for 24 hours.  ACTIVITY:  You should plan to take it easy for the rest of today and you should NOT DRIVE or use heavy machinery until tomorrow (because of the sedation medicines used during the test).    FOLLOW UP: Our staff will call the number listed on your records the next business day following your procedure to check on you and address any questions or concerns that you may have regarding the information given to you following your procedure. If we do not reach you, we will leave a message.  However, if you are feeling well and you are not experiencing any problems, there is no need to return our call.  We will assume that you have returned to your regular daily activities without incident.  If any biopsies were taken you will be contacted by phone or by letter within the next 1-3 weeks.  Please call us at [phone] if you have not heard about the biopsies in 3 weeks.    SIGNATURES/CONFIDENTIALITY: You and/or your care partner have signed paperwork which will be entered into your electronic medical record.  These signatures attest to the fact that that the information above on your After Visit Summary has been reviewed and is understood.  Full responsibility of the confidentiality of this discharge information lies with you and/or your care-partner.  Be sure to  take your medicine 1/2 hour before breakfast every morning in order to control your reflux.

## 2015-05-22 NOTE — Progress Notes (Signed)
Called to room to assist during endoscopic procedure.  Patient ID and intended procedure confirmed with present staff. Received instructions for my participation in the procedure from the performing physician.  

## 2015-05-22 NOTE — Op Note (Signed)
[facility]  [address] [place], [id number]   ENDOSCOPY PROCEDURE REPORT  PATIENT: [name], [name]  MR#: [id number] BIRTHDATE: 05-08-1956 , 59  yrs. old GENDER: female ENDOSCOPIST: Jerene Bears, MD REFERRED BY:  Redge Gainer, M.D. PROCEDURE DATE:  05/22/2015 PROCEDURE:  EGD, diagnostic and EGD w/ balloon dilation ASA CLASS:     Class II INDICATIONS:  dysphagia. MEDICATIONS: Monitored anesthesia care and Propofol 200 mg IV TOPICAL ANESTHETIC: none  DESCRIPTION OF PROCEDURE: After the risks benefits and alternatives of the procedure were thoroughly explained, informed consent was obtained.  The LB [id number] endoscope was introduced through the mouth and advanced to the second portion of the duodenum , Without limitations.  The instrument was slowly withdrawn as the mucosa was fully examined.   ESOPHAGUS: A mild Schatzki ring was found at GE junction 36 cm from the incisors.  No evidence of Barrett's esophagus.  Using a TTS-balloon the stricture was dilated up to 17mm (after no resistance at 15 mm and no mucosal change after 16.5 mm).  The balloon was held inflated for 60 seconds.  Following this dilation, there was a small mucosal rent.   The mucosa of the esophagus appeared normal.  STOMACH: A 2 cm hiatal hernia was noted.   The mucosa of the stomach appeared normal.  DUODENUM: The duodenal mucosa showed no abnormalities in the bulb and 2nd part of the duodenum.  Retroflexed views revealed a hiatal hernia.     The scope was then withdrawn from the patient and the procedure completed.  COMPLICATIONS: There were no immediate complications.     ENDOSCOPIC IMPRESSION: 1.   Schatzki ring was found 36 cm from the incisors; balloon dilation to 18 mm 2.   The mucosa of the esophagus appeared normal 3.   2 cm hiatal hernia 4.   The mucosa of the stomach appeared normal 5.   The duodenal mucosa showed no abnormalities in the bulb and 2nd part of the  duodenum  RECOMMENDATIONS: 1.  Continue taking your PPI (omeprazole) once daily.  It is best to be taken 20-30 minutes prior to breakfast meal. 2.  Office follow-up next available to assess response to dilation    eSigned:  Jerene Bears, MD 05/22/2015 10:34 AM    GY:JEHUDJ Laurance Flatten, MD and The Patient  PATIENT NAME:  Rhonda Thomas, Rhonda Thomas MR#: [id number]

## 2015-05-25 ENCOUNTER — Other Ambulatory Visit: Payer: Self-pay | Admitting: Pharmacist

## 2015-05-25 ENCOUNTER — Telehealth: Payer: Self-pay | Admitting: *Deleted

## 2015-05-25 NOTE — Telephone Encounter (Signed)
  Follow up Call-  Call back number 05/22/2015  Post procedure Call Back phone  # [phone]  Permission to leave phone message Yes     Patient questions:  Do you have a fever, pain , or abdominal swelling? No. Pain Score  0 *  Have you tolerated food without any problems? Yes.    Have you been able to return to your normal activities? No.  Do you have any questions about your discharge instructions: Diet   No Medications  No. Follow up visit  No.  Do you have questions or concerns about your Care? No.  Actions: * If pain score is 4 or above: No action needed, pain <4.

## 2015-05-28 DIAGNOSIS — Z0289 Encounter for other administrative examinations: Secondary | ICD-10-CM

## 2015-06-09 ENCOUNTER — Ambulatory Visit (HOSPITAL_COMMUNITY): Payer: Self-pay | Admitting: Psychology

## 2015-06-11 ENCOUNTER — Other Ambulatory Visit: Payer: Self-pay | Admitting: Family Medicine

## 2015-06-17 ENCOUNTER — Ambulatory Visit (INDEPENDENT_AMBULATORY_CARE_PROVIDER_SITE_OTHER): Payer: BC Managed Care – PPO | Admitting: Psychology

## 2015-06-17 ENCOUNTER — Encounter: Payer: Self-pay | Admitting: Nurse Practitioner

## 2015-06-17 ENCOUNTER — Telehealth: Payer: Self-pay

## 2015-06-17 DIAGNOSIS — F419 Anxiety disorder, unspecified: Secondary | ICD-10-CM

## 2015-06-17 DIAGNOSIS — F332 Major depressive disorder, recurrent severe without psychotic features: Secondary | ICD-10-CM | POA: Diagnosis not present

## 2015-06-17 NOTE — Progress Notes (Signed)
Patient ID: Rhonda Thomas, female   DOB: 1956-11-25, 59 y.o.   MRN: [id number] Patient:  [name]   DOB: Feb 05, 1956  MR Number: [id number]  Location: [facility] [address] [place] [address] Dept: [phone]  Start: 9 AM End: 10 AM  Provider/Observer:     Edgardo Roys PSYD  Chief Complaint:      Chief Complaint  Patient presents with  . Depression  . Anxiety    Reason For Service:     I seen the patient is also known to 4 several years. She was initially referred here for difficulty coping with the situation involving her children. Her youngest son is at severe difficulties over the years with regard to mood disorder, substance abuse and behavioral problems. Now there are major stressors associated with him again the patient has been essentially overwhelmed and unsure about what to do about. She reports that this has created a lot of stress both at home and at work. On top of that, her diabetes has gotten to the point that she had hammertoe amputated because of that. She has neuropathy as a result of diabetes as well and has had to stay out of her foot for 8 weeks.  Interventions Strategy:  Cognitive/behavioral psychotherapeutic interventions  Participation Level:   Active  Participation Quality:  Appropriate      Behavioral Observation:  Well Groomed, Alert, and Depressed.   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  She is out at least until January.  The patient reports that she is still having stress with son.   Content of Session:   Review current symptoms and continued work on building improve coping skills  Current Status:   The patient reports that depression has improved and she is coping much better now that her medical issues with foot have stabilized.  Last  Reviewed:   06/17/2015  Goals Addressed Today:    Goals addressed included building better coping skills.  Impression/Diagnosis:   The patient has a long history of attention deficit disorder but do to Maj. psychosocial stressors she also developed clinical depression and anxiety. I think that her attentional problems were valid prior to the development of these issues that existed her entire life. However, she is in and repeat if and recurrent trauma with regard primarily to her son and to her daughter to a lesser degree in years past.  Diagnosis:    Axis I:  Major depressive disorder, recurrent, severe without psychotic features  Anxiety      Axis II: No diagnosis      Nick Stults R, PsyD 06/17/2015

## 2015-06-17 NOTE — Telephone Encounter (Signed)
[employer] has approved the request for coverage on Lyrica effective until 06/16/2016 Ref # [id number]

## 2015-06-23 ENCOUNTER — Ambulatory Visit (INDEPENDENT_AMBULATORY_CARE_PROVIDER_SITE_OTHER): Payer: BC Managed Care – PPO | Admitting: *Deleted

## 2015-06-23 DIAGNOSIS — Z23 Encounter for immunization: Secondary | ICD-10-CM

## 2015-06-23 DIAGNOSIS — Z298 Encounter for other specified prophylactic measures: Secondary | ICD-10-CM

## 2015-06-23 NOTE — Progress Notes (Signed)
Shingles vaccine was given in left arm sub q and tolerated well.

## 2015-06-23 NOTE — Patient Instructions (Signed)
Shingles Vaccine What You Need to Know WHAT IS SHINGLES?  Shingles is a painful skin rash, often with blisters. It is also called Herpes Zoster or just Zoster.  A shingles rash usually appears on one side of the face or body and lasts from 2 to 4 weeks. Its main symptom is pain, which can be quite severe. Other symptoms of shingles can include fever, headache, chills, and upset stomach. Very rarely, a shingles infection can lead to pneumonia, hearing problems, blindness, brain inflammation (encephalitis), or death.  For about 1 person in 5, severe pain can continue even after the rash clears up. This is called post-herpetic neuralgia.  Shingles is caused by the Varicella Zoster virus. This is the same virus that causes chickenpox. Only someone who has had a case of chickenpox or rarely, has gotten chickenpox vaccine, can get shingles. The virus stays in your body. It can reappear many years later to cause a case of shingles.  You cannot catch shingles from another person with shingles. However, a person who has never had chickenpox (or chickenpox vaccine) could get chickenpox from someone with shingles. This is not very common.  Shingles is far more common in people 50 and older than in younger people. It is also more common in people whose immune systems are weakened because of a disease such as cancer or drugs such as steroids or chemotherapy.  At least 1 million people get shingles per year in the [place]. SHINGLES VACCINE  A vaccine for shingles was licensed in 2006. In clinical trials, the vaccine reduced the risk of shingles by 50%. It can also reduce the pain in people who still get shingles after being vaccinated.  A single dose of shingles vaccine is recommended for adults 59 years of age and older. SOME PEOPLE SHOULD NOT GET SHINGLES VACCINE OR SHOULD WAIT A person should not get shingles vaccine if he or she:  Has ever had a life-threatening allergic reaction to gelatin, the  antibiotic neomycin, or any other component of shingles vaccine. Tell your caregiver if you have any severe allergies.  Has a weakened immune system because of current:  AIDS or another disease that affects the immune system.  Treatment with drugs that affect the immune system, such as prolonged use of high-dose steroids.  Cancer treatment, such as radiation or chemotherapy.  Cancer affecting the bone marrow or lymphatic system, such as leukemia or lymphoma.  Is pregnant, or might be pregnant. Women should not become pregnant until at least 4 weeks after getting shingles vaccine. Someone with a minor illness, such as a cold, may be vaccinated. Anyone with a moderate or severe acute illness should usually wait until he or she recovers before getting the vaccine. This includes anyone with a temperature of 101.3 F (38 C) or higher. WHAT ARE THE RISKS FROM SHINGLES VACCINE?  A vaccine, like any medicine, could possibly cause serious problems, such as severe allergic reactions. However, the risk of a vaccine causing serious harm, or death, is extremely small.  No serious problems have been identified with shingles vaccine. Mild Problems  Redness, soreness, swelling, or itching at the site of the injection (about 1 person in 3).  Headache (about 1 person in 70). Like all vaccines, shingles vaccine is being closely monitored for unusual or severe problems. WHAT IF THERE IS A MODERATE OR SEVERE REACTION? What should I look for? Any unusual condition, such as a severe allergic reaction or a high fever. If a severe allergic reaction   occurred, it would be within a few minutes to an hour after the shot. Signs of a serious allergic reaction can include difficulty breathing, weakness, hoarseness or wheezing, a fast heartbeat, hives, dizziness, paleness, or swelling of the throat. What should I do?  Call your caregiver, or get the person to a caregiver right away.  Tell the caregiver what  happened, the date and time it happened, and when the vaccination was given.  Ask the caregiver to report the reaction by filing a Vaccine Adverse Event Reporting System (VAERS) form. Or, you can file this report through the VAERS web site at www.vaers.hhs.gov or by calling 1-800-822-7967. VAERS does not provide medical advice. HOW CAN I LEARN MORE?  Ask your caregiver. He or she can give you the vaccine package insert or suggest other sources of information.  Contact the Centers for Disease Control and Prevention (CDC):  Call 1-800-232-4636 (1-800-CDC-INFO).  Visit the CDC website at www.cdc.gov/vaccines CDC Shingles Vaccine VIS (09/16/08) Document Released: 09/25/2006 Document Revised: 02/20/2012 Document Reviewed: 03/20/2013 ExitCare Patient Information 2015 [employer]. This information is not intended to replace advice given to you by your health care provider. Make sure you discuss any questions you have with your health care provider.  

## 2015-07-03 ENCOUNTER — Telehealth: Payer: Self-pay

## 2015-07-03 NOTE — Telephone Encounter (Signed)
I spoke to husband, patient unable to come to phone. She has appt on Monday but just started CPAP. She needs to reschedule for 30days out. She will call back to do so, I will cancel appt on Monday.

## 2015-07-06 ENCOUNTER — Ambulatory Visit: Payer: Self-pay | Admitting: Neurology

## 2015-07-07 ENCOUNTER — Encounter: Payer: Self-pay | Admitting: *Deleted

## 2015-07-14 ENCOUNTER — Ambulatory Visit (INDEPENDENT_AMBULATORY_CARE_PROVIDER_SITE_OTHER): Payer: BC Managed Care – PPO | Admitting: Internal Medicine

## 2015-07-14 ENCOUNTER — Encounter: Payer: Self-pay | Admitting: Internal Medicine

## 2015-07-14 VITALS — BP 102/66 | HR 76 | Ht 69.0 in | Wt 197.2 lb

## 2015-07-14 DIAGNOSIS — K449 Diaphragmatic hernia without obstruction or gangrene: Secondary | ICD-10-CM | POA: Diagnosis not present

## 2015-07-14 DIAGNOSIS — K219 Gastro-esophageal reflux disease without esophagitis: Secondary | ICD-10-CM

## 2015-07-14 DIAGNOSIS — K222 Esophageal obstruction: Secondary | ICD-10-CM

## 2015-07-14 DIAGNOSIS — Q394 Esophageal web: Secondary | ICD-10-CM

## 2015-07-14 NOTE — Progress Notes (Signed)
   Subjective:    Patient ID: Rhonda Thomas, female    DOB: February 03, 1956, 59 y.o.   MRN: [id number]  HPI Rhonda Thomas is a 59 year old female with past medical history of hiatal hernia with Schatzki's ring resulting in dysphagia status post EGD with dilation performed on 05/22/2015. She returns for follow-up. She also has a history of diabetes, ADD, sleep apnea. She sees Dr. Laurance Flatten at [facility].  She reports that she is doing very well after dilation. A Schatzki's ring was found at the GE junction without evidence of Barrett's esophagus. It was dilated up to 18 mm using TTS balloon. There was a small mucosal rent. The mucosa of the stomach and duodenum appeared normal. She reports dramatic improvement in esophageal dysphagia after dilation. No trouble swallowing or painful swallowing. Good appetite. No nausea or vomiting. Bowel movements are regular. She previously had colonoscopy within the last 2-3 years. She denies history of colon polyps  Review of Systems As per history of present illness, otherwise negative  Current Medications, Allergies, Past Medical History, Past Surgical History, Family History and Social History were reviewed in [employer] record.     Objective:   Physical Exam BP 102/66 mmHg  Pulse 76  Ht 5\' 9"  (1.753 m)  Wt 197 lb 3.2 oz (89.449 kg)  BMI 29.11 kg/m2 Constitutional: Well-developed and well-nourished. No distress. HEENT: Normocephalic and atraumatic.  No scleral icterus. Neck: Neck supple. Trachea midline. Cardiovascular: Normal rate, regular rhythm and intact distal pulses. No M/R/G Pulmonary/chest: Effort normal and breath sounds normal. No wheezing, rales or rhonchi. Abdominal: Soft, nontender, nondistended. Bowel sounds active throughout. Extremities: no clubbing, cyanosis, or edema Neurological: Alert and oriented to person place and time. Psychiatric: Normal mood and affect. Behavior is normal.     Assessment & Plan:    59 year old female with past medical history of hiatal hernia with Schatzki's ring resulting in dysphagia status post EGD with dilation performed on 05/22/2015.  1. Schatzki's ring/GERD -- resolution of esophageal dysphagia after balloon dilation. We discussed that durability of dilation varies and she may require repeat esophageal dilation should dysphagia return. Controlling reflux should help prevent recurrent scar tissue. She will continue omeprazole 40 mg daily. She can follow-up on an as-needed basis and is asked to notify me if dysphagia returns. She voices understanding  2. CRC screening -- previous colonoscopy, she recalls no polyps. Repeat colonoscopy recommended 10 years from last exam

## 2015-07-14 NOTE — Patient Instructions (Signed)
Please follow up with Dr Pyrtle as needed. 

## 2015-07-20 ENCOUNTER — Other Ambulatory Visit: Payer: Self-pay | Admitting: Nurse Practitioner

## 2015-07-22 ENCOUNTER — Ambulatory Visit (INDEPENDENT_AMBULATORY_CARE_PROVIDER_SITE_OTHER): Payer: BC Managed Care – PPO | Admitting: Psychology

## 2015-07-22 DIAGNOSIS — F419 Anxiety disorder, unspecified: Secondary | ICD-10-CM

## 2015-07-22 DIAGNOSIS — F332 Major depressive disorder, recurrent severe without psychotic features: Secondary | ICD-10-CM | POA: Diagnosis not present

## 2015-07-27 ENCOUNTER — Ambulatory Visit (INDEPENDENT_AMBULATORY_CARE_PROVIDER_SITE_OTHER): Payer: BC Managed Care – PPO | Admitting: Family Medicine

## 2015-07-27 ENCOUNTER — Encounter: Payer: Self-pay | Admitting: Family Medicine

## 2015-07-27 VITALS — BP 112/76 | HR 89 | Temp 98.9°F | Ht 69.0 in | Wt 195.4 lb

## 2015-07-27 DIAGNOSIS — R0981 Nasal congestion: Secondary | ICD-10-CM | POA: Insufficient documentation

## 2015-07-27 NOTE — Patient Instructions (Signed)

## 2015-07-27 NOTE — Progress Notes (Signed)
BP 112/76 mmHg  Pulse 89  Temp(Src) 98.9 F (37.2 C) (Oral)  Ht 5' 9"  (1.753 m)  Wt 195 lb 6.4 oz (88.633 kg)  BMI 28.84 kg/m2   Subjective:    Patient ID: Rhonda Thomas, female    DOB: August 31, 1956, 59 y.o.   MRN: [id number]  HPI: Rhonda Thomas is a 59 y.o. female presenting on 07/27/2015 for Cough; Nasal Congestion; and Fever   HPI Sinus congestion Patient presents today with a 2 day history of nasal congestion, maxillary sinus pressure, nasal drainage, sore throat, hoarse voice and postnasal drip. Her husband started with this 6 days ago and has had worsening of her. She denies any fevers or chills. She denies any nausea vomiting. Denies any difficulty breathing. Symptoms are mild to moderate per patient.  Relevant past medical, surgical, family and social history reviewed and updated as indicated. Interim medical history since our last visit reviewed. Allergies and medications reviewed and updated.  Review of Systems  Constitutional: Negative for fever and chills.  HENT: Positive for postnasal drip, rhinorrhea, sinus pressure, sneezing, sore throat and voice change. Negative for congestion, ear discharge, ear pain and hearing loss.   Eyes: Negative for pain, discharge, redness and visual disturbance.  Respiratory: Positive for cough. Negative for chest tightness, shortness of breath and wheezing.   Cardiovascular: Negative for chest pain and leg swelling.  Genitourinary: Negative for dysuria and difficulty urinating.  Musculoskeletal: Negative for back pain and gait problem.  Skin: Negative for rash.  Neurological: Negative for light-headedness and headaches.  Psychiatric/Behavioral: Negative for behavioral problems and agitation.  All other systems reviewed and are negative.   Per HPI unless specifically indicated above     Medication List       This list is accurate as of: 07/27/15  3:43 PM.  Always use your most recent med list.               acetaminophen 500 MG  tablet  Commonly known as:  TYLENOL  Take 500 mg by mouth every 6 (six) hours as needed.     carbidopa-levodopa 25-100 MG per tablet  Commonly known as:  SINEMET IR  Take 1 tablet by mouth 3 (three) times daily.     Dulaglutide 1.5 MG/0.5ML Sopn  Commonly known as:  TRULICITY  Inject 1.5 mg into the skin once a week.     estrogens (conjugated) 1.25 MG tablet  Commonly known as:  PREMARIN  Take 1 tablet (1.25 mg total) by mouth daily.     guaiFENesin 600 MG 12 hr tablet  Commonly known as:  MUCINEX  Take by mouth 2 (two) times daily.     HYDROcodone-acetaminophen 10-325 MG per tablet  Commonly known as:  NORCO  Take 1-2 tablets by mouth every 4 (four) hours as needed.     LANTUS SOLOSTAR 100 UNIT/ML Solostar Pen  Generic drug:  Insulin Glargine  INJECT 20 UNITS DAILY. APPOINTMENT NEEDED FOR FURTHER REFILLS     metFORMIN 500 MG 24 hr tablet  Commonly known as:  GLUCOPHAGE-XR  TAKE 1 TABLET BY MOUTH TWICE DAILY     NOVOLOG FLEXPEN 100 UNIT/ML FlexPen  Generic drug:  insulin aspart  INJECT 3 TO 5 UNITS INTO THE SKIN 3 TIMES DAILY WITH MEALS. THIS IS A 100 DAY SUPPLY     omeprazole 40 MG capsule  Commonly known as:  PRILOSEC  Take 1 capsule (40 mg total) by mouth daily.     onetouch ultrasoft lancets  CHECK DAILY  AND AS NEEDED     ONETOUCH VERIO test strip  Generic drug:  glucose blood  USE TO TEST BLOOD GLUCOSE TWICE DAILY     OXcarbazepine 150 MG tablet  Commonly known as:  TRILEPTAL  TAKE 1 TABLET BY MOUTH EVERY MORNING & TAKE 2 TABLETS BY MOUTH AT BEDTIME     pregabalin 150 MG capsule  Commonly known as:  LYRICA  Take 1 capsule (150 mg total) by mouth 3 (three) times daily.     UNIFINE PENTIPS 31G X 6 MM Misc  Generic drug:  Insulin Pen Needle     zolpidem 10 MG tablet  Commonly known as:  AMBIEN  TAKE 1/2 - 1 TABLET BY MOUTH AT BEDTIME AS NEEDED FOR SLEEP           Objective:    BP 112/76 mmHg  Pulse 89  Temp(Src) 98.9 F (37.2 C) (Oral)  Ht 5'  9" (1.753 m)  Wt 195 lb 6.4 oz (88.633 kg)  BMI 28.84 kg/m2  Wt Readings from Last 3 Encounters:  07/27/15 195 lb 6.4 oz (88.633 kg)  07/14/15 197 lb 3.2 oz (89.449 kg)  05/22/15 193 lb (87.544 kg)    Physical Exam  Constitutional: She is oriented to person, place, and time. [name]. No distress.  HENT:  Right Ear: Hearing, tympanic membrane, external ear and ear canal normal.  Left Ear: Hearing, tympanic membrane, external ear and ear canal normal.  Nose: Mucosal edema and rhinorrhea present. No sinus tenderness, nasal deformity or septal deviation. Right sinus exhibits maxillary sinus tenderness. Right sinus exhibits no frontal sinus tenderness. Left sinus exhibits maxillary sinus tenderness. Left sinus exhibits no frontal sinus tenderness.  Mouth/Throat: Uvula is midline and mucous membranes are normal. Posterior oropharyngeal edema present. No oropharyngeal exudate or posterior oropharyngeal erythema.  Eyes: Conjunctivae and EOM are normal. Pupils are equal, round, and reactive to light.  Cardiovascular: Normal rate and regular rhythm.   No murmur heard. Pulmonary/Chest: Effort normal and breath sounds normal. No respiratory distress. She has no wheezes.  Musculoskeletal: Normal range of motion. She exhibits no edema or tenderness.  Neurological: She is alert and oriented to person, place, and time. Coordination normal.  Skin: Skin is warm and dry. No rash noted. She is not diaphoretic.  Psychiatric: She has a normal mood and affect. [name].  Vitals reviewed.   Results for orders placed or performed in visit on 04/21/15  [facility]  Result Value Ref Range   Glucose 119 (H) 65 - 99 mg/dL   BUN 10 6 - 24 mg/dL   Creatinine, Ser 0.76 0.57 - 1.00 mg/dL   GFR calc non Af Amer 87 >59 mL/min/1.73   GFR calc Af Amer 100 >59 mL/min/1.73   BUN/Creatinine Ratio 13 9 - 23   Sodium 143 134 - 144 mmol/L   Potassium 4.5 3.5 - 5.2 mmol/L    Chloride 101 97 - 108 mmol/L   CO2 24 18 - 29 mmol/L   Calcium 9.1 8.7 - 10.2 mg/dL  Hepatic function panel  Result Value Ref Range   Total Protein 6.3 6.0 - 8.5 g/dL   Albumin 4.1 3.5 - 5.5 g/dL   Bilirubin Total <0.2 0.0 - 1.2 mg/dL   Bilirubin, Direct 0.08 0.00 - 0.40 mg/dL   Alkaline Phosphatase 73 39 - 117 IU/L   AST 21 0 - 40 IU/L   ALT 16 0 - 32 IU/L  POCT CBC  Result Value Ref Range  WBC 8.7 4.6 - 10.2 K/uL   Lymph, poc 1.6 0.6 - 3.4   POC LYMPH PERCENT 18.5 10 - 50 %L   POC Granulocyte 6.6 2 - 6.9   Granulocyte percent 76.2 37 - 80 %G   RBC 4.87 4.04 - 5.48 M/uL   Hemoglobin 13.4 12.2 - 16.2 g/dL   HCT, POC 42.8 37.7 - 47.9 %   MCV 87.9 80 - 97 fL   MCH, POC 27.4 27 - 31.2 pg   MCHC 31.2 (A) 31.8 - 35.4 g/dL   RDW, POC 13.6 %   Platelet Count, POC 223 142 - 424 K/uL   MPV 7.6 0 - 99.8 fL      Assessment & Plan:   Problem List Items Addressed This Visit      Respiratory   Sinus congestion - Primary    Will try flonase, allergy pill and netty pot, if not improved in 5 days will call for antibiotic           Follow up plan: Return if symptoms worsen or fail to improve.  Caryl Pina, MD [facility] 07/27/2015, 3:43 PM

## 2015-07-27 NOTE — Assessment & Plan Note (Signed)
Will try flonase, allergy pill and netty pot, if not improved in 5 days will call for antibiotic

## 2015-07-28 ENCOUNTER — Ambulatory Visit (HOSPITAL_COMMUNITY): Payer: BC Managed Care – PPO | Admitting: Psychology

## 2015-07-28 DIAGNOSIS — F419 Anxiety disorder, unspecified: Secondary | ICD-10-CM

## 2015-07-28 DIAGNOSIS — F332 Major depressive disorder, recurrent severe without psychotic features: Secondary | ICD-10-CM

## 2015-07-29 ENCOUNTER — Telehealth: Payer: Self-pay

## 2015-07-29 ENCOUNTER — Telehealth: Payer: Self-pay | Admitting: Family Medicine

## 2015-07-29 ENCOUNTER — Encounter: Payer: Self-pay | Admitting: Nurse Practitioner

## 2015-07-29 ENCOUNTER — Encounter: Payer: BC Managed Care – PPO | Admitting: Nurse Practitioner

## 2015-07-29 VITALS — BP 113/73 | HR 98 | Ht 71.0 in | Wt 195.6 lb

## 2015-07-29 MED ORDER — AZITHROMYCIN 250 MG PO TABS: ORAL_TABLET | ORAL | Status: DC

## 2015-07-29 NOTE — Telephone Encounter (Signed)
Depends on when she was set up. Ideally, I would like delay the appointment by a month, if possible. As long as we see her before the first 3 months on CPAP are done and she can be better compliant. Pls call patient back, thx

## 2015-07-29 NOTE — Telephone Encounter (Signed)
Pt aware  Rx was sent to pharmacy. 

## 2015-07-29 NOTE — Progress Notes (Signed)
[facility]  PATIENT: Rhonda Thomas DOB: Feb 10, 1956   REASON FOR VISIT: HISTORY FROM:    HISTORY OF PRESENT ILLNESS: REVIEW OF SYSTEMS: Full 14 system review of systems performed and notable only for those listed, all others are neg:  Constitutional: neg  Cardiovascular: neg Ear/Nose/Throat: neg  Skin: neg Eyes: neg Respiratory: neg Gastroitestinal: neg  Hematology/Lymphatic: neg  Endocrine: neg Musculoskeletal:neg Allergy/Immunology: neg Neurological: neg Psychiatric: neg Sleep : neg   ALLERGIES: Allergies  Allergen Reactions  . Actos [Pioglitazone]     Edema   . Invokana [Canagliflozin]     vaginitis    HOME MEDICATIONS: Outpatient Prescriptions Prior to Visit  Medication Sig Dispense Refill  . acetaminophen (TYLENOL) 500 MG tablet Take 500 mg by mouth every 6 (six) hours as needed.    . carbidopa-levodopa (SINEMET IR) 25-100 MG per tablet Take 1 tablet by mouth 3 (three) times daily. 90 tablet 6  . Dulaglutide (TRULICITY) 1.5 NW/2.9FA SOPN Inject 1.5 mg into the skin once a week. 2 mL 6  . estrogens, conjugated, (PREMARIN) 1.25 MG tablet Take 1 tablet (1.25 mg total) by mouth daily. (Patient taking differently: Take 1.25 mg by mouth daily as needed. ) 30 tablet 11  . guaiFENesin (MUCINEX) 600 MG 12 hr tablet Take by mouth 2 (two) times daily.    Marland Kitchen HYDROcodone-acetaminophen (NORCO) 10-325 MG per tablet Take 1-2 tablets by mouth every 4 (four) hours as needed.     . Lancets (ONETOUCH ULTRASOFT) lancets CHECK DAILY AND AS NEEDED 100 each 3  . LANTUS SOLOSTAR 100 UNIT/ML Solostar Pen INJECT 20 UNITS DAILY. APPOINTMENT NEEDED FOR FURTHER REFILLS 15 mL 2  . metFORMIN (GLUCOPHAGE-XR) 500 MG 24 hr tablet TAKE 1 TABLET BY MOUTH TWICE DAILY 60 tablet 1  . NOVOLOG FLEXPEN 100 UNIT/ML FlexPen INJECT 3 TO 5 UNITS INTO THE SKIN 3 TIMES DAILY WITH MEALS. THIS IS A 100 DAY SUPPLY 15 mL 1  . omeprazole (PRILOSEC) 40 MG capsule Take 1 capsule (40 mg total) by  mouth daily. 30 capsule 3  . ONETOUCH VERIO test strip USE TO TEST BLOOD GLUCOSE TWICE DAILY 100 each 2  . OXcarbazepine (TRILEPTAL) 150 MG tablet TAKE 1 TABLET BY MOUTH EVERY MORNING & TAKE 2 TABLETS BY MOUTH AT BEDTIME 90 tablet 3  . pregabalin (LYRICA) 150 MG capsule Take 1 capsule (150 mg total) by mouth 3 (three) times daily. 90 capsule 5  . UNIFINE PENTIPS 31G X 6 MM MISC     . zolpidem (AMBIEN) 10 MG tablet TAKE 1/2 - 1 TABLET BY MOUTH AT BEDTIME AS NEEDED FOR SLEEP 30 tablet 2   No facility-administered medications prior to visit.    PAST MEDICAL HISTORY: Past Medical History  Diagnosis Date  . Diabetes   . Anxiety   . Movement disorder   . Neuropathy   . Attention deficit disorder (ADD)   . Depression   . Sleep apnea   . Schatzki's ring   . Hiatal hernia     PAST SURGICAL HISTORY: Past Surgical History  Procedure Laterality Date  . Cholecystectomy    . Tubal ligation    . Abdominal hysterectomy    . Toe amputation Right     2nd toe rt foot  . Rectocele repair      FAMILY HISTORY: Family History  Problem Relation Age of Onset  . Diabetes    . Heart Problems    . Stroke Mother   . Colon cancer Neg Hx  SOCIAL HISTORY: Social History   Social History  . Marital Status: Married    Spouse Name: [name]  . Number of Children: 3  . Years of Education: college   Occupational History  .      [employer]   Social History Main Topics  . Smoking status: Never Smoker   . Smokeless tobacco: Never Used  . Alcohol Use: No  . Drug Use: No  . Sexual Activity: Not on file   Other Topics Concern  . Not on file   Social History Narrative   Patient lives at home with her husband. [name]).   Patient works for [employer].    Education- College   Right handed.   Caffeine- tea one cup daily.           PHYSICAL EXAM  Filed Vitals:   07/29/15 0909  BP: 113/73  Pulse: 98  Height: 5\' 11"  (1.803 m)  Weight: 195 lb 9.6 oz  (88.724 kg)   Body mass index is 27.29 kg/(m^2). DIAGNOSTIC DATA (LABS, IMAGING, TESTING) - I reviewed patient records, labs, notes, testing and imaging myself where available.  Lab Results  Component Value Date   WBC 8.7 04/21/2015   HGB 13.4 04/21/2015   HCT 42.8 04/21/2015   MCV 87.9 04/21/2015   PLT 255 12/05/2014      Component Value Date/Time   NA 143 04/21/2015 1137   NA 139 12/05/2014 2015   K 4.5 04/21/2015 1137   CL 101 04/21/2015 1137   CO2 24 04/21/2015 1137   GLUCOSE 119* 04/21/2015 1137   GLUCOSE 106* 12/05/2014 2015   BUN 10 04/21/2015 1137   BUN 14 12/05/2014 2015   CREATININE 0.76 04/21/2015 1137   CREATININE 0.69 06/21/2013 0931   CALCIUM 9.1 04/21/2015 1137   PROT 6.3 04/21/2015 1137   PROT 6.5 06/21/2013 0931   ALBUMIN 4.1 06/21/2013 0931   AST 21 04/21/2015 1137   ALT 16 04/21/2015 1137   ALKPHOS 73 04/21/2015 1137   BILITOT <0.2 04/21/2015 1137   BILITOT 0.2 01/07/2015 1101   GFRNONAA 87 04/21/2015 1137   GFRNONAA >89 06/21/2013 0931   GFRAA 100 04/21/2015 1137   GFRAA >89 06/21/2013 0931   Lab Results  Component Value Date   CHOL 190 09/17/2014   HDL 45 09/17/2014   LDLCALC 85 09/17/2014   LDLDIRECT 83 09/17/2014   TRIG 302* 09/17/2014   CHOLHDL 4.2 09/17/2014   Lab Results  Component Value Date   HGBA1C 6.6 08/15/2014   No results found for: VITAMINB12 No results found for: TSH  ASSESSMENT AND PLAN  59 y.o. year old female  has a past medical history of Diabetes; Anxiety; Movement disorder; Neuropathy; Attention deficit disorder (ADD); Depression; Sleep apnea; Schatzki's ring; and Hiatal hernia. here with    Dennie Bible, [facility], [facility], APRN  [facility] [address], [place], [place] [address] [phone]  This encounter was created in error - please disregard. This encounter was created in error - please disregard.

## 2015-07-29 NOTE — Telephone Encounter (Signed)
I spoke to [place]. She was put on Carolyn's schedule but needs to see you for initial CPAP f/u. Patient needed to move her appt due to being sick. She states that she has been sick for past month. Jalacia reports that between being sick and feeling like she cannot exhale completely, she has not used her CPAP very much. She has an ppt with you on Monday. Do you want to discuss then? Or have any recommendation in the mean time? Down load is on your desk.

## 2015-07-30 ENCOUNTER — Ambulatory Visit: Payer: BC Managed Care – PPO | Admitting: Nurse Practitioner

## 2015-07-30 NOTE — Telephone Encounter (Signed)
I spoke to patient and she would like to come in on Monday and discuss the trouble that she is having with the machine

## 2015-07-31 ENCOUNTER — Encounter: Payer: Self-pay | Admitting: Family Medicine

## 2015-07-31 ENCOUNTER — Ambulatory Visit (INDEPENDENT_AMBULATORY_CARE_PROVIDER_SITE_OTHER): Payer: BC Managed Care – PPO | Admitting: Family Medicine

## 2015-07-31 ENCOUNTER — Ambulatory Visit: Payer: Self-pay | Admitting: Family Medicine

## 2015-07-31 VITALS — BP 123/81 | HR 102 | Temp 97.6°F | Ht 69.0 in | Wt 190.6 lb

## 2015-07-31 DIAGNOSIS — E1140 Type 2 diabetes mellitus with diabetic neuropathy, unspecified: Secondary | ICD-10-CM

## 2015-07-31 DIAGNOSIS — E785 Hyperlipidemia, unspecified: Secondary | ICD-10-CM | POA: Diagnosis not present

## 2015-07-31 DIAGNOSIS — J0101 Acute recurrent maxillary sinusitis: Secondary | ICD-10-CM

## 2015-07-31 LAB — POCT GLYCOSYLATED HEMOGLOBIN (HGB A1C): Hemoglobin A1C: 5.8

## 2015-07-31 MED ORDER — BETAMETHASONE SOD PHOS & ACET 6 (3-3) MG/ML IJ SUSP
6.0000 mg | Freq: Once | INTRAMUSCULAR | Status: AC
  Administered 2015-07-31: 6 mg via INTRAMUSCULAR

## 2015-07-31 MED ORDER — AMOXICILLIN-POT CLAVULANATE 875-125 MG PO TABS: 1.0000 | ORAL_TABLET | Freq: Two times a day (BID) | ORAL | Status: DC

## 2015-07-31 NOTE — Progress Notes (Signed)
Subjective:  Patient ID: Rhonda Thomas, female    DOB: 01-04-1956  Age: 59 y.o. MRN: [id number]  CC: Diabetes; Hyperlipidemia; and URI   HPI Rhonda Thomas presents forFollow-up of diabetes. Patient does check blood sugar at home she denies any elevated readings. Patient denies symptoms such as polyuria, polydipsia, excessive hunger, nausea No significant hypoglycemic spells noted. Medications as noted below. Taking them regularly without complication/adverse reaction being reported today.   Symptoms include congestion, facial pain, nasal congestion, no  fever, non productive cough, post nasal drip and sinus pressure with no fever, chills, night sweats or weight loss. Onset of symptoms was a few days ago, gradually worsening since that time.     History Rhonda Thomas has a past medical history of Diabetes; Anxiety; Movement disorder; Neuropathy; Attention deficit disorder (ADD); Depression; Sleep apnea; Schatzki's ring; and Hiatal hernia.   She has past surgical history that includes Cholecystectomy; Tubal ligation; Abdominal hysterectomy; Toe amputation (Right); and Rectocele repair.   Her family history includes Diabetes in an other family member; Heart Problems in an other family member; Stroke in her mother. There is no history of Colon cancer.She reports that she has never smoked. She has never used smokeless tobacco. She reports that she does not drink alcohol or use illicit drugs.  Current Outpatient Prescriptions on File Prior to Visit  Medication Sig Dispense Refill  . acetaminophen (TYLENOL) 500 MG tablet Take 500 mg by mouth every 6 (six) hours as needed.    . carbidopa-levodopa (SINEMET IR) 25-100 MG per tablet Take 1 tablet by mouth 3 (three) times daily. 90 tablet 6  . Dulaglutide (TRULICITY) 1.5 TH/4.3OO SOPN Inject 1.5 mg into the skin once a week. 2 mL 6  . estrogens, conjugated, (PREMARIN) 1.25 MG tablet Take 1 tablet (1.25 mg total) by mouth daily. (Patient taking differently:  Take 1.25 mg by mouth daily as needed. ) 30 tablet 11  . fluconazole (DIFLUCAN) 150 MG tablet     . guaiFENesin (MUCINEX) 600 MG 12 hr tablet Take by mouth 2 (two) times daily.    Marland Kitchen HYDROcodone-acetaminophen (NORCO) 10-325 MG per tablet Take 1-2 tablets by mouth every 4 (four) hours as needed.     . Lancets (ONETOUCH ULTRASOFT) lancets CHECK DAILY AND AS NEEDED 100 each 3  . LANTUS SOLOSTAR 100 UNIT/ML Solostar Pen INJECT 20 UNITS DAILY. APPOINTMENT NEEDED FOR FURTHER REFILLS 15 mL 2  . metFORMIN (GLUCOPHAGE-XR) 500 MG 24 hr tablet TAKE 1 TABLET BY MOUTH TWICE DAILY 60 tablet 1  . NOVOLOG FLEXPEN 100 UNIT/ML FlexPen INJECT 3 TO 5 UNITS INTO THE SKIN 3 TIMES DAILY WITH MEALS. THIS IS A 100 DAY SUPPLY 15 mL 1  . omeprazole (PRILOSEC) 40 MG capsule Take 1 capsule (40 mg total) by mouth daily. 30 capsule 3  . ONETOUCH VERIO test strip USE TO TEST BLOOD GLUCOSE TWICE DAILY 100 each 2  . OXcarbazepine (TRILEPTAL) 150 MG tablet TAKE 1 TABLET BY MOUTH EVERY MORNING & TAKE 2 TABLETS BY MOUTH AT BEDTIME 90 tablet 3  . pregabalin (LYRICA) 150 MG capsule Take 1 capsule (150 mg total) by mouth 3 (three) times daily. 90 capsule 5  . UNIFINE PENTIPS 31G X 6 MM MISC     . zolpidem (AMBIEN) 10 MG tablet TAKE 1/2 - 1 TABLET BY MOUTH AT BEDTIME AS NEEDED FOR SLEEP 30 tablet 2   No current facility-administered medications on file prior to visit.    ROS Review of Systems  Constitutional: Negative for fever, chills,  diaphoresis, appetite change, fatigue and unexpected weight change.  HENT: Negative for congestion, ear pain, hearing loss, postnasal drip, rhinorrhea, sneezing, sore throat and trouble swallowing.   Eyes: Negative for pain.  Respiratory: Negative for cough, chest tightness and shortness of breath.   Cardiovascular: Negative for chest pain and palpitations.  Gastrointestinal: Negative for nausea, vomiting, abdominal pain, diarrhea and constipation.  Genitourinary: Negative for dysuria, frequency and  menstrual problem.  Musculoskeletal: Negative for joint swelling and arthralgias.  Skin: Negative for rash.  Neurological: Negative for dizziness, weakness, numbness and headaches.  Psychiatric/Behavioral: Negative for dysphoric mood and agitation.    Objective:  BP 123/81 mmHg  Pulse 102  Temp(Src) 97.6 F (36.4 C) (Oral)  Ht _0  (1.753 m)  Wt 190 lb 9.6 oz (86.456 kg)  BMI 28.13 kg/m2  BP Readings from Last 3 Encounters:  07/31/15 123/81  07/29/15 113/73  07/27/15 112/76    Wt Readings from Last 3 Encounters:  07/31/15 190 lb 9.6 oz (86.456 kg)  07/29/15 195 lb 9.6 oz (88.724 kg)  07/27/15 195 lb 6.4 oz (88.633 kg)     Physical Exam  Constitutional: She is oriented to person, place, and time. [name]. No distress.  HENT:  Head: Normocephalic and atraumatic.  Right Ear: External ear normal.  Left Ear: External ear normal.  Nose: Nose normal.  Mouth/Throat: Oropharynx is clear and moist.  Eyes: Conjunctivae and EOM are normal. Pupils are equal, round, and reactive to light.  Neck: Normal range of motion. Neck supple. No thyromegaly present.  Cardiovascular: Normal rate, regular rhythm and normal heart sounds.   No murmur heard. Pulmonary/Chest: Effort normal and breath sounds normal. No respiratory distress. She has no wheezes. She has no rales.  Abdominal: Soft. Bowel sounds are normal. She exhibits no distension. There is no tenderness.  Lymphadenopathy:    She has no cervical adenopathy.  Neurological: She is alert and oriented to person, place, and time. She has normal reflexes.  Skin: Skin is warm and dry.  Psychiatric: She has a normal mood and affect. [name]. Judgment and thought content normal.    Lab Results  Component Value Date   HGBA1C 5.8 07/31/2015   HGBA1C 6.6 08/15/2014   HGBA1C 6.6 04/23/2014    Lab Results  Component Value Date   WBC 9.2 07/31/2015   HGB 13.4 04/21/2015   HCT 40.5  07/31/2015   PLT 255 12/05/2014   GLUCOSE 140* 07/31/2015   CHOL 140 07/31/2015   TRIG 253* 07/31/2015   HDL 48 07/31/2015   LDLDIRECT 83 09/17/2014   LDLCALC 41 07/31/2015   ALT 10 07/31/2015   AST 13 07/31/2015   NA 138 07/31/2015   K 4.9 07/31/2015   CL 97 07/31/2015   CREATININE 0.64 07/31/2015   BUN 9 07/31/2015   CO2 25 07/31/2015   HGBA1C 5.8 07/31/2015   MICROALBUR 0.50 06/21/2013     Assessment & Plan:   Atalie was seen today for diabetes, hyperlipidemia and uri.  Diagnoses and all orders for this visit:  Type 2 diabetes mellitus with diabetic neuropathy -     CBC with Differential/Platelet -     POCT glycosylated hemoglobin (Hb A1C) -     CMP14+EGFR  Hyperlipidemia -     Lipid panel  Acute recurrent maxillary sinusitis -     betamethasone acetate-betamethasone sodium phosphate (CELESTONE) injection 6 mg; Inject 1 mL (6 mg total) into the muscle once.  Other orders -  amoxicillin-clavulanate (AUGMENTIN) 875-125 MG per tablet; Take 1 tablet by mouth 2 (two) times daily. Take all of this medication -     CBC with Differential/Platelet   I have discontinued Ms. Aikman's azithromycin. I am also having her start on amoxicillin-clavulanate. Additionally, I am having her maintain her estrogens (conjugated), onetouch ultrasoft, HYDROcodone-acetaminophen, ONETOUCH VERIO, UNIFINE PENTIPS, carbidopa-levodopa, pregabalin, LANTUS SOLOSTAR, Dulaglutide, omeprazole, zolpidem, NOVOLOG FLEXPEN, metFORMIN, OXcarbazepine, guaiFENesin, acetaminophen, and fluconazole. We administered betamethasone acetate-betamethasone sodium phosphate.  Meds ordered this encounter  Medications  . betamethasone acetate-betamethasone sodium phosphate (CELESTONE) injection 6 mg    Sig:   . amoxicillin-clavulanate (AUGMENTIN) 875-125 MG per tablet    Sig: Take 1 tablet by mouth 2 (two) times daily. Take all of this medication    Dispense:  20 tablet    Refill:  0     Follow-up: Return in  about 6 months (around 01/31/2016) for CPE.  Claretta Fraise, M.D.

## 2015-08-01 LAB — CMP14+EGFR
ALT: 10 IU/L (ref 0–32)
AST: 13 IU/L (ref 0–40)
Albumin/Globulin Ratio: 1.3 (ref 1.1–2.5)
Albumin: 3.8 g/dL (ref 3.5–5.5)
Alkaline Phosphatase: 124 IU/L — ABNORMAL HIGH (ref 39–117)
BUN/Creatinine Ratio: 14 (ref 9–23)
BUN: 9 mg/dL (ref 6–24)
Bilirubin Total: 0.3 mg/dL (ref 0.0–1.2)
CO2: 25 mmol/L (ref 18–29)
Calcium: 9.1 mg/dL (ref 8.7–10.2)
Chloride: 97 mmol/L (ref 97–108)
Creatinine, Ser: 0.64 mg/dL (ref 0.57–1.00)
GFR calc Af Amer: 114 mL/min/{1.73_m2} (ref >59)
GFR calc non Af Amer: 99 mL/min/{1.73_m2} (ref >59)
Globulin, Total: 3 g/dL (ref 1.5–4.5)
Glucose: 140 mg/dL — ABNORMAL HIGH (ref 65–99)
Potassium: 4.9 mmol/L (ref 3.5–5.2)
Sodium: 138 mmol/L (ref 134–144)
Total Protein: 6.8 g/dL (ref 6.0–8.5)

## 2015-08-01 LAB — CBC WITH DIFFERENTIAL/PLATELET
Basophils Absolute: 0 x10E3/uL (ref 0.0–0.2)
Basos: 0 %
EOS (ABSOLUTE): 0.1 x10E3/uL (ref 0.0–0.4)
Eos: 1 %
Hematocrit: 40.5 % (ref 34.0–46.6)
Hemoglobin: 13.5 g/dL (ref 11.1–15.9)
Immature Grans (Abs): 0 x10E3/uL (ref 0.0–0.1)
Immature Granulocytes: 0 %
Lymphocytes Absolute: 1.8 x10E3/uL (ref 0.7–3.1)
Lymphs: 20 %
MCH: 29.7 pg (ref 26.6–33.0)
MCHC: 33.3 g/dL (ref 31.5–35.7)
MCV: 89 fL (ref 79–97)
Monocytes Absolute: 0.7 x10E3/uL (ref 0.1–0.9)
Monocytes: 7 %
Neutrophils Absolute: 6.5 x10E3/uL (ref 1.4–7.0)
Neutrophils: 72 %
Platelets: 301 x10E3/uL (ref 150–379)
RBC: 4.54 x10E6/uL (ref 3.77–5.28)
RDW: 14.1 % (ref 12.3–15.4)
WBC: 9.2 x10E3/uL (ref 3.4–10.8)

## 2015-08-01 LAB — LIPID PANEL
Chol/HDL Ratio: 2.9 ratio units (ref 0.0–4.4)
Cholesterol, Total: 140 mg/dL (ref 100–199)
HDL: 48 mg/dL (ref >39)
LDL Calculated: 41 mg/dL (ref 0–99)
Triglycerides: 253 mg/dL — ABNORMAL HIGH (ref 0–149)
VLDL Cholesterol Cal: 51 mg/dL — ABNORMAL HIGH (ref 5–40)

## 2015-08-02 ENCOUNTER — Other Ambulatory Visit: Payer: Self-pay | Admitting: Family Medicine

## 2015-08-02 MED ORDER — KRILL OIL OMEGA-3 300 MG PO CAPS: 2.0000 | ORAL_CAPSULE | Freq: Two times a day (BID) | ORAL | Status: DC

## 2015-08-03 ENCOUNTER — Encounter: Payer: Self-pay | Admitting: Neurology

## 2015-08-03 ENCOUNTER — Ambulatory Visit (INDEPENDENT_AMBULATORY_CARE_PROVIDER_SITE_OTHER): Payer: BC Managed Care – PPO | Admitting: Neurology

## 2015-08-03 ENCOUNTER — Other Ambulatory Visit: Payer: Self-pay | Admitting: Family Medicine

## 2015-08-03 ENCOUNTER — Encounter: Payer: Self-pay | Admitting: Family Medicine

## 2015-08-03 VITALS — BP 124/80 | HR 78 | Resp 16 | Ht 69.0 in | Wt 191.0 lb

## 2015-08-03 DIAGNOSIS — J0100 Acute maxillary sinusitis, unspecified: Secondary | ICD-10-CM | POA: Diagnosis not present

## 2015-08-03 DIAGNOSIS — J069 Acute upper respiratory infection, unspecified: Secondary | ICD-10-CM

## 2015-08-03 DIAGNOSIS — G4733 Obstructive sleep apnea (adult) (pediatric): Secondary | ICD-10-CM

## 2015-08-03 DIAGNOSIS — G609 Hereditary and idiopathic neuropathy, unspecified: Secondary | ICD-10-CM

## 2015-08-03 DIAGNOSIS — G2581 Restless legs syndrome: Secondary | ICD-10-CM

## 2015-08-03 MED ORDER — PREDNISONE 10 MG PO TABS: ORAL_TABLET | ORAL | Status: DC

## 2015-08-03 NOTE — Patient Instructions (Addendum)
Please re-start you CPAP after your antibiotics are finished. Make sure, you have the full 10 day course.  Reduce or eliminate your ramp time on your CPAP. Your DME provider, can guide you if you need help with the machine.   Please continue using your CPAP regularly. While your insurance requires that you use CPAP at least 4 hours each night on 70% of the nights, I recommend, that you not skip any nights and use it throughout the night if you can. Getting used to CPAP and staying with the treatment long term does take time and patience and discipline. Untreated obstructive sleep apnea when it is moderate to severe can have an adverse impact on cardiovascular health and raise her risk for heart disease, arrhythmias, hypertension, congestive heart failure, stroke and diabetes. Untreated obstructive sleep apnea causes sleep disruption, nonrestorative sleep, and sleep deprivation. This can have an impact on your day to day functioning and cause daytime sleepiness and impairment of cognitive function, memory loss, mood disturbance, and problems focussing. Using CPAP regularly can improve these symptoms.

## 2015-08-03 NOTE — Progress Notes (Signed)
Subjective:    Patient ID: Rhonda Thomas is a 59 y.o. female.  HPI     Star Age, MD, PhD [facility] [address] [place], [place] [address]  Dear Hoyle Sauer and [name],   I saw your patient, Rhonda Thomas, upon your kind request in my clinic today for initial consultation of her sleep disorder, after her recent sleep study. The patient is unaccompanied today. As you know, [name] is a 59 year old right-handed woman with an underlying medical history of restless leg syndrome, diabetes, anxiety, neuropathy, ADD and overweight state, who was referred for an attended sleep study due to a report of excessive daytime somnolence and snoring. She had a split-night sleep study on 04/21/2015 and I went over her test results with her in detail today.  Her baseline sleep efficiency was 94.5% with a latency to sleep of 6 minutes and wake after sleep onset of 3 minutes. She had absence of REM sleep prior to CPAP initiation. She had mild PLMS with minimal arousals. She had no significant EKG or EEG changes. She had mild snoring. She had 51 obstructive hypopneas. Her total AHI was 19.6 per hour. Average oxygen saturation was 90%, nadir was 81%. Time below 88% saturation was 24 minutes. She was then titrated on CPAP. Sleep efficiency was 96.5%. Sleep latency 0.5 minutes and wake after sleep onset 7 minutes. She had a normal arousal index. She had an increased percentage of stage II sleep, absence of slow-wave sleep, and REM sleep at 16.3%. Average oxygen saturation was improved at 92%, nadir was 86% and time below 88% saturation only 31 seconds. She had mild PLMS with very mild arousals. CPAP was titrated from a pressure of 5 cm to 10 cm with the AHI reduced to 0 per hour at a pressure of 9 cm with supine REM sleep achieved on that pressure. Post study, the patient indicated that she slept better than usual. Based on her test results I prescribed CPAP therapy for home  use.  Today, 08/03/2015: I reviewed her CPAP compliance data from 06/29/2015 through 07/28/2015 which is a total of 30 days during which time she used her machine only 7 days with percent used days greater than 4 hours at only 20%, indicating poor compliance with an average usage for days on machine at 4 hours and 57 minutes, residual AHI at 1.3 per hour, leak low with the 95th percentile at 6.2 L/m on a pressure of 9 cm with EPR of 3.   Today, 08/03/2015: She reports that for the past few weeks she has not been feeling well. She has seen her primary care physician or one of his colleagues twice recently. She has been on treatment for a maxillary sinusitis and upper respiratory infection. She was first treated with azithromycin, then on 07/31/2015 she received stereotactic injection and a prescription for Augmentin twice daily for 10 days. She still on it. She has ongoing issues with congestion and sore throat and raspy voice. She feels that the CPAP has been difficult to tolerate for this reason. Also when she puts it on it is hard for her to breathe as it ramps up. The ramp time is 20 minutes and she feels that 9 cm is tolerable to breathe against but as it ramps up she finds that she does not get enough air. She has not reduced her ramp time and she was not aware how to do this. She has not been in touch with her DME  company for this. She received her supplies from [employer]. She denies any recent fevers. She has never been a smoker. She has no history of asthma or recurrent pneumonias. Bedtime is around midnight. Rise time is around 8. 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 She has some restless leg symptoms and ongoing issues with neuropathy.  Her Past Medical History Is Significant For: Past Medical History  Diagnosis Date  . Diabetes   . Anxiety   . Movement disorder   . Neuropathy   . Attention deficit disorder (ADD)   . Depression   . Sleep  apnea   . Schatzki's ring   . Hiatal hernia     Her Past Surgical History Is Significant For: Past Surgical History  Procedure Laterality Date  . Cholecystectomy    . Tubal ligation    . Abdominal hysterectomy    . Toe amputation Right     2nd toe rt foot  . Rectocele repair      Her Family History Is Significant For: Family History  Problem Relation Age of Onset  . Diabetes    . Heart Problems    . Stroke Mother   . Colon cancer Neg Hx     Her Social History Is Significant For: Social History   Social History  . Marital Status: Married    Spouse Name: [name]  . Number of Children: 3  . Years of Education: college   Occupational History  .      [employer]   Social History Main Topics  . Smoking status: Never Smoker   . Smokeless tobacco: Never Used  . Alcohol Use: No  . Drug Use: No  . Sexual Activity: Not Asked   Other Topics Concern  . None   Social History Narrative   Patient lives at home with her husband. [name]).   Patient works for [employer].    Education- College   Right handed.   Caffeine- tea one cup daily.          Her Allergies Are:  Allergies  Allergen Reactions  . Actos [Pioglitazone]     Edema   . Invokana [Canagliflozin]     vaginitis  :   Her Current Medications Are:  Outpatient Encounter Prescriptions as of 08/03/2015  Medication Sig  . acetaminophen (TYLENOL) 500 MG tablet Take 500 mg by mouth every 6 (six) hours as needed.  Marland Kitchen amoxicillin-clavulanate (AUGMENTIN) 875-125 MG per tablet Take 1 tablet by mouth 2 (two) times daily. Take all of this medication  . carbidopa-levodopa (SINEMET IR) 25-100 MG per tablet Take 1 tablet by mouth 3 (three) times daily.  . Dulaglutide (TRULICITY) 1.5 WT/2.1CC SOPN Inject 1.5 mg into the skin once a week.  . estrogens, conjugated, (PREMARIN) 1.25 MG tablet Take 1 tablet (1.25 mg total) by mouth daily. (Patient taking differently: Take 1.25 mg by mouth daily as  needed. )  . fluconazole (DIFLUCAN) 150 MG tablet   . guaiFENesin (MUCINEX) 600 MG 12 hr tablet Take by mouth 2 (two) times daily.  Marland Kitchen HYDROcodone-acetaminophen (NORCO) 10-325 MG per tablet Take 1-2 tablets by mouth every 4 (four) hours as needed.   Astrid Drafts Omega-3 300 MG CAPS Take 2 capsules by mouth 2 (two) times daily.  . Lancets (ONETOUCH ULTRASOFT) lancets CHECK DAILY AND AS NEEDED  . LANTUS SOLOSTAR 100 UNIT/ML Solostar Pen INJECT 20 UNITS DAILY. APPOINTMENT NEEDED FOR FURTHER REFILLS  .  metFORMIN (GLUCOPHAGE-XR) 500 MG 24 hr tablet TAKE 1 TABLET BY MOUTH TWICE DAILY  . NOVOLOG FLEXPEN 100 UNIT/ML FlexPen INJECT 3 TO 5 UNITS INTO THE SKIN 3 TIMES DAILY WITH MEALS. THIS IS A 100 DAY SUPPLY  . omeprazole (PRILOSEC) 40 MG capsule Take 1 capsule (40 mg total) by mouth daily.  Glory Rosebush VERIO test strip USE TO TEST BLOOD GLUCOSE TWICE DAILY  . OXcarbazepine (TRILEPTAL) 150 MG tablet TAKE 1 TABLET BY MOUTH EVERY MORNING & TAKE 2 TABLETS BY MOUTH AT BEDTIME  . pregabalin (LYRICA) 150 MG capsule Take 1 capsule (150 mg total) by mouth 3 (three) times daily.  Marland Kitchen UNIFINE PENTIPS 31G X 6 MM MISC   . zolpidem (AMBIEN) 10 MG tablet TAKE 1/2 - 1 TABLET BY MOUTH AT BEDTIME AS NEEDED FOR SLEEP   No facility-administered encounter medications on file as of 08/03/2015.  :  Review of Systems:  Out of a complete 14 point review of systems, all are reviewed and negative with the exception of these symptoms as listed below:  Review of Systems  Neurological:       Patient feels like she cannot breathe while using CPAP. She states that it is uncomfortable and feels like she cannot exhale completely. Reports that she cannot fall asleep with CPAP without taking a sleeping aid.     Objective:  Neurologic Exam  Physical Exam Physical Examination:   Filed Vitals:   08/03/15 1332  BP: 124/80  Pulse: 78  Resp: 16   General Examination: The patient is a very pleasant 59 y.o. female in no acute distress.  She appears well-developed and very well groomed.   HEENT: Normocephalic, atraumatic, pupils are equal, round and reactive to light and accommodation. Funduscopic exam is normal with sharp disc margins noted. Extraocular tracking is good without limitation to gaze excursion or nystagmus noted. Normal smooth pursuit is noted. Hearing is grossly intact. Tympanic membranes are clear bilaterally. Face is symmetric with normal facial animation and normal facial sensation. Speech is nasal sounding and voice is raspy sounding with no dysarthria noted. There is no hypophonia. There is no lip, neck/head, jaw or voice tremor. Neck is supple with full range of passive and active motion. There are no carotid bruits on auscultation. Oropharynx exam reveals: mild mouth dryness, good dental hygiene and mild airway crowding, narrow airway entry. She has pharyngeal erythema. Mallampati is class II. Tonsils are 1+. Tongue protrudes centrally and palate elevates symmetrically.   Chest: Clear to auscultation without wheezing, rhonchi or crackles noted.  Heart: S1+S2+0, regular and normal without murmurs, rubs or gallops noted.   Abdomen: Soft, non-tender and non-distended with normal bowel sounds appreciated on auscultation.  Extremities: There is no pitting edema in the distal lower extremities bilaterally. Pedal pulses are intact.  Skin: Warm and dry without trophic changes noted. There are no varicose veins.  Musculoskeletal: exam reveals no obvious joint deformities, tenderness or joint swelling or erythema.   Neurologically:  Mental status: The patient is awake, alert and oriented in all 4 spheres. Her immediate and remote memory, attention, language skills and fund of knowledge are appropriate. There is no evidence of aphasia, agnosia, apraxia or anomia. Speech is clear with normal prosody and enunciation. Thought process is linear. Mood is normal and affect is normal.  Cranial nerves II - XII are as described  above under HEENT exam. In addition: shoulder shrug is normal with equal shoulder height noted. Motor exam: Normal bulk, strength and tone is noted. There  is no drift, tremor or rebound. Romberg is negative, except for sway noted. Reflexes are 2+ throughout, but absent in both ankles. Babinski: Toes are flexor bilaterally. Fine motor skills and coordination: intact with normal finger taps, normal hand movements, normal rapid alternating patting, normal foot taps and normal foot agility.  Cerebellar testing: No dysmetria or intention tremor on finger to nose testing. Heel to shin is unremarkable bilaterally. There is no truncal or gait ataxia.  Sensory exam: intact to light touch, pinprick, vibration, temperature sense in the upper extremities with reduction to all modalities noted in the distal lower extremities, up to mid shin areas.  Gait, station and balance: She stands easily. No veering to one side is noted. No leaning to one side is noted. Posture is age-appropriate and stance is narrow based. Gait shows normal stride length and normal pace. No problems turning are noted. She turns en bloc.   Assessment and Plan:   In summary, Ryleeann Urquiza is a very pleasant 59 y.o.-year old female with an underlying medical history of restless leg syndrome, diabetes, anxiety, neuropathy, ADD and overweight state, who presents for initial consultation of her moderate obstructive sleep apnea, after sleep study testing in May 2016.  I had a long chat with the patient about my findings and the diagnosis of OSA, its prognosis and treatment options. We talked about medical treatments, surgical interventions and non-pharmacological approaches. I explained in particular the risks and ramifications of untreated moderate to severe OSA, especially with respect to developing cardiovascular disease down the Road, including congestive heart failure, difficult to treat hypertension, cardiac arrhythmias, or stroke. Even type 2  diabetes has, in part, been linked to untreated OSA. Symptoms of untreated OSA include daytime sleepiness, memory problems, mood irritability and mood disorder such as depression and anxiety, lack of energy, as well as recurrent headaches, especially morning headaches. We talked about trying to maintain a healthy lifestyle in general, as well as the importance of weight control. I encouraged the patient to eat healthy, exercise daily and keep well hydrated, to keep a scheduled bedtime and wake time routine, to not skip any meals and eat healthy snacks in between meals. I advised the patient not to drive when feeling sleepy. I recommended the following at this time: She is advised to complete her anti-biotic course which showed finished next week. After this, she is advised to restart CPAP therapy. She is advised to reduce or eliminate her ramp time. She can request assistance from her DME company if she needs additional help with changing the setting. We talked about her sleeps test results in detail today. We also talked about her recent compliance data. Understandably because of her sinusitis and upper respiratory infection she has been unable to use her CPAP. She does feel that she is able to breathe better when she has the full pressure of 9 cm. At this juncture, I suggested we see her back in 3 months, sooner if needed.  I answered all her questions today and the patient was in agreement. Thank you very much for allowing me to participate in the care of this nice patient. If I can be of any further assistance to you please do not hesitate to talk to me.   Sincerely,   Star Age, MD, PhD   I spent 25 minutes in total face-to-face time with the patient, more than 50% of which was spent in counseling and coordination of care, reviewing test results, reviewing medication and discussing or reviewing the diagnosis  of OSA, its prognosis and treatment options.

## 2015-08-10 ENCOUNTER — Other Ambulatory Visit: Payer: Self-pay | Admitting: Family Medicine

## 2015-08-13 ENCOUNTER — Other Ambulatory Visit: Payer: Self-pay | Admitting: Family Medicine

## 2015-08-19 ENCOUNTER — Ambulatory Visit (INDEPENDENT_AMBULATORY_CARE_PROVIDER_SITE_OTHER): Payer: BC Managed Care – PPO | Admitting: Psychology

## 2015-08-19 DIAGNOSIS — F419 Anxiety disorder, unspecified: Secondary | ICD-10-CM | POA: Diagnosis not present

## 2015-08-19 DIAGNOSIS — F332 Major depressive disorder, recurrent severe without psychotic features: Secondary | ICD-10-CM

## 2015-08-20 ENCOUNTER — Encounter (HOSPITAL_COMMUNITY): Payer: Self-pay | Admitting: Psychology

## 2015-08-20 NOTE — Progress Notes (Signed)
Patient ID: [name], female   DOB: 03-07-1956, 59 y.o.   MRN: [id number] [name]   DOB: 1956-09-10  MR Number: [id number]  Location: [facility] [address] [place] [address] Dept: [phone]  Start: 9 AM End: 10 AM  Provider/Observer:     Edgardo Roys PSYD  Chief Complaint:      Chief Complaint  Patient presents with  . Depression  . Anxiety  . Stress    Reason For Service:     I seen the patient is also known to 4 several years. She was initially referred here for difficulty coping with the situation involving her children. Her youngest son is at severe difficulties over the years with regard to mood disorder, substance abuse and behavioral problems. Now there are major stressors associated with him again the patient has been essentially overwhelmed and unsure about what to do about. She reports that this has created a lot of stress both at home and at work. On top of that, her diabetes has gotten to the point that she had hammertoe amputated because of that. She has neuropathy as a result of diabetes as well and has had to stay out of her foot for 8 weeks.  Interventions Strategy:  Cognitive/behavioral psychotherapeutic interventions  Participation Level:   Active  Participation Quality:  Appropriate      Behavioral Observation:  Well Groomed, Alert, and Depressed.   Current Psychosocial Factors: The patient reports that she has continued to be very stressed by the situation with her son. His legal issues have settled to some degree and he has outside of the substance treatment program. However, he is living at home and is clearly having manic symptoms associated with his bipolar disorder. We're working on trying to get him for treatment as well.   Content of Session:   Review current symptoms and continued work on building improve coping skills  Current  Status:   The patient reports that depression has improved and she is coping much better now that her medical issues with foot have stabilized.  Last Reviewed:   08/19/2015  Goals Addressed Today:    Goals addressed included building better coping skills.  Impression/Diagnosis:   The patient has a long history of attention deficit disorder but do to Maj. psychosocial stressors she also developed clinical depression and anxiety. I think that her attentional problems were valid prior to the development of these issues that existed her entire life. However, she is in and repeat if and recurrent trauma with regard primarily to her son and to her daughter to a lesser degree in years past.  Diagnosis:    Axis I:  Major depressive disorder, recurrent, severe without psychotic features  Anxiety      Axis II: No diagnosis      Micajah Dennin R, PsyD 08/20/2015

## 2015-09-07 ENCOUNTER — Ambulatory Visit (HOSPITAL_COMMUNITY): Payer: BC Managed Care – PPO | Admitting: Psychology

## 2015-09-08 ENCOUNTER — Encounter (HOSPITAL_COMMUNITY): Payer: Self-pay | Admitting: Psychology

## 2015-09-08 NOTE — Progress Notes (Signed)
Patient ID: Rhonda Thomas, female   DOB: 1956/05/12, 59 y.o.   MRN: [id number] [name]   DOB: 1956/09/07  MR Number: [id number]  Location: [facility] [address] [place] [address] Dept: [phone]  Start: 11 AM End: 12 PM  Provider/Observer:     Edgardo Roys PSYD  Chief Complaint:      Chief Complaint  Patient presents with  . Depression  . Anxiety  . Stress    Reason For Service:     I seen the patient is also known to 4 several years. She was initially referred here for difficulty coping with the situation involving her children. Her youngest son is at severe difficulties over the years with regard to mood disorder, substance abuse and behavioral problems. Now there are major stressors associated with him again the patient has been essentially overwhelmed and unsure about what to do about. She reports that this has created a lot of stress both at home and at work. On top of that, her diabetes has gotten to the point that she had hammertoe amputated because of that. She has neuropathy as a result of diabetes as well and has had to stay out of her foot for 8 weeks.  Interventions Strategy:  Cognitive/behavioral psychotherapeutic interventions  Participation Level:   Active  Participation Quality:  Appropriate      Behavioral Observation:  Well Groomed, Alert, and Depressed.   Current Psychosocial Factors: The patient reports that she has continued to have a lot of stress with her son. The patient reports that he has become more agitated and there've been issues that he has been coping with from a legal standpoint. The patient reports that she really does not know what she can do at this point with regard to her son. The patient reports that her daughter is also been a number of medical issues which make things very difficult for her to cope with.   Content of  Session:   Review current symptoms and continued work on building improve coping skills  Current Status:   The patient reports that depression has improved and she is coping much better now that her medical issues with foot have stabilized.  Last Reviewed:   07/22/2015  Goals Addressed Today:    Goals addressed included building better coping skills.  Impression/Diagnosis:   The patient has a long history of attention deficit disorder but do to Maj. psychosocial stressors she also developed clinical depression and anxiety. I think that her attentional problems were valid prior to the development of these issues that existed her entire life. However, she is in and repeat if and recurrent trauma with regard primarily to her son and to her daughter to a lesser degree in years past.  Diagnosis:    Axis I:  Major depressive disorder, recurrent, severe without psychotic features  Anxiety      Axis II: No diagnosis      [name], PsyD 09/08/2015

## 2015-09-08 NOTE — Progress Notes (Signed)
Patient ID: [name], female   DOB: [birth year], 59 y.o.   MRN: [id number] [name]   DOB: Jan 14, 1956  MR Number: [id number]  Location: [facility] [address] [place] [address] Dept: [phone]  Start: 11 AM End: 12 PM  Provider/Observer:     Edgardo Roys PSYD  Chief Complaint:      Chief Complaint  Patient presents with  . Anxiety  . Depression  . Stress    Reason For Service:     I seen the patient is also known to 4 several years. She was initially referred here for difficulty coping with the situation involving her children. Her youngest son is at severe difficulties over the years with regard to mood disorder, substance abuse and behavioral problems. Now there are major stressors associated with him again the patient has been essentially overwhelmed and unsure about what to do about. She reports that this has created a lot of stress both at home and at work. On top of that, her diabetes has gotten to the point that she had hammertoe amputated because of that. She has neuropathy as a result of diabetes as well and has had to stay out of her foot for 8 weeks.  Interventions Strategy:  Cognitive/behavioral psychotherapeutic interventions  Participation Level:   Active  Participation Quality:  Appropriate      Behavioral Observation:  Well Groomed, Alert, and Depressed.   Current Psychosocial Factors: The patient reports that she has been overwhelmed with the issues primarily with her son. He has gotten into a great deal of trouble recently after doing heroin with the friend while the 2 young boys at the patient takes care of her in the back seat. The patient lost consciousness and the friend called an ambulance. The patient was recently arrested with regard to in danger of the children. The patient had been the primary person responsible for these 2 kids as the girlfriend  of her son essentially turned over the care of her sons to her son.   Content of Session:   Review current symptoms and continued work on building improve coping skills  Current Status:   The patient reports that depression has improved and she is coping much better now that her medical issues with foot have stabilized.  Last Reviewed:   07/28/2015  Goals Addressed Today:    Goals addressed included building better coping skills.  Impression/Diagnosis:   The patient has a long history of attention deficit disorder but do to Maj. psychosocial stressors she also developed clinical depression and anxiety. I think that her attentional problems were valid prior to the development of these issues that existed her entire life. However, she is in and repeat if and recurrent trauma with regard primarily to her son and to her daughter to a lesser degree in years past.  Diagnosis:    Axis I:  Major depressive disorder, recurrent, severe without psychotic features  Anxiety      Axis II: No diagnosis      [name] R, PsyD 09/08/2015

## 2015-09-09 ENCOUNTER — Ambulatory Visit: Payer: BC Managed Care – PPO | Admitting: Pediatrics

## 2015-09-09 ENCOUNTER — Other Ambulatory Visit: Payer: Self-pay | Admitting: Neurology

## 2015-09-09 ENCOUNTER — Other Ambulatory Visit: Payer: Self-pay | Admitting: Family Medicine

## 2015-09-10 NOTE — Telephone Encounter (Signed)
Last seen 07/31/15  Dr Livia Snellen  If approved route to nurse to call into  [facility]  [phone]

## 2015-09-11 NOTE — Telephone Encounter (Signed)
RX called into [facility] Okayed per Dr Stacks 

## 2015-09-11 NOTE — Telephone Encounter (Signed)
Please review and advise.

## 2015-09-16 ENCOUNTER — Other Ambulatory Visit: Payer: Self-pay | Admitting: Neurology

## 2015-09-22 ENCOUNTER — Ambulatory Visit (INDEPENDENT_AMBULATORY_CARE_PROVIDER_SITE_OTHER): Payer: BC Managed Care – PPO | Admitting: Psychology

## 2015-09-22 DIAGNOSIS — F419 Anxiety disorder, unspecified: Secondary | ICD-10-CM

## 2015-09-22 DIAGNOSIS — F332 Major depressive disorder, recurrent severe without psychotic features: Secondary | ICD-10-CM

## 2015-09-24 ENCOUNTER — Encounter (HOSPITAL_COMMUNITY): Payer: Self-pay | Admitting: Psychology

## 2015-09-24 NOTE — Progress Notes (Signed)
Patient ID: Rhonda Thomas, female   DOB: 1956/11/22, 59 y.o.   MRN: [id number] Patient:  Kanani Mowbray   DOB: 1956/09/25  MR Number: [id number]  Location: [facility] [address] [place] [address] Dept: [phone]  Start: 9 AM End: 10 AM  Provider/Observer:     Edgardo Roys PSYD  Chief Complaint:      Chief Complaint  Patient presents with  . Anxiety  . Agitation  . Stress  . Depression    Reason For Service:     I seen the patient is also known to 4 several years. She was initially referred here for difficulty coping with the situation involving her children. Her youngest son is at severe difficulties over the years with regard to mood disorder, substance abuse and behavioral problems. Now there are major stressors associated with him again the patient has been essentially overwhelmed and unsure about what to do about. She reports that this has created a lot of stress both at home and at work. On top of that, her diabetes has gotten to the point that she had hammertoe amputated because of that. She has neuropathy as a result of diabetes as well and has had to stay out of her foot for 8 weeks.  Interventions Strategy:  Cognitive/behavioral psychotherapeutic interventions  Participation Level:   Active  Participation Quality:  Appropriate      Behavioral Observation:  Well Groomed, Alert, and Depressed.   Current Psychosocial Factors: The patient reports that she has continued to be very stressed by the situation with her son. His legal issues have settled to some degree and he has outside of the substance treatment program. However, he is living at home and is clearly having manic symptoms associated with his bipolar disorder. We're working on trying to get him for treatment as well.   Content of Session:   Review current symptoms and continued work on building improve coping  skills  Current Status:   The patient reports that depression has improved and she is coping much better now that her medical issues with foot have stabilized.  Last Reviewed:   09/22/2015  Goals Addressed Today:    Goals addressed included building better coping skills.  Impression/Diagnosis:   The patient has a long history of attention deficit disorder but do to Maj. psychosocial stressors she also developed clinical depression and anxiety. I think that her attentional problems were valid prior to the development of these issues that existed her entire life. However, she is in and repeat if and recurrent trauma with regard primarily to her son and to her daughter to a lesser degree in years past.  Diagnosis:    Axis I:  Major depressive disorder, recurrent, severe without psychotic features ([place])  Anxiety      Axis II: No diagnosis      Quanetta Truss R, PsyD 09/24/2015

## 2015-10-09 ENCOUNTER — Other Ambulatory Visit: Payer: Self-pay | Admitting: Family Medicine

## 2015-10-09 ENCOUNTER — Other Ambulatory Visit: Payer: Self-pay | Admitting: Pharmacist

## 2015-10-09 ENCOUNTER — Ambulatory Visit (HOSPITAL_COMMUNITY): Payer: Self-pay | Admitting: Psychology

## 2015-11-02 ENCOUNTER — Ambulatory Visit (INDEPENDENT_AMBULATORY_CARE_PROVIDER_SITE_OTHER): Payer: BC Managed Care – PPO | Admitting: Family Medicine

## 2015-11-02 ENCOUNTER — Encounter: Payer: Self-pay | Admitting: Family Medicine

## 2015-11-02 VITALS — BP 115/70 | HR 78 | Temp 97.8°F | Ht 69.0 in | Wt 195.0 lb

## 2015-11-02 DIAGNOSIS — R300 Dysuria: Secondary | ICD-10-CM | POA: Diagnosis not present

## 2015-11-02 DIAGNOSIS — N3090 Cystitis, unspecified without hematuria: Secondary | ICD-10-CM | POA: Diagnosis not present

## 2015-11-02 LAB — POCT URINALYSIS DIPSTICK
Bilirubin, UA: NEGATIVE
Blood, UA: NEGATIVE
Glucose, UA: NEGATIVE
Ketones, UA: NEGATIVE
Leukocytes, UA: NEGATIVE
Nitrite, UA: NEGATIVE
Protein, UA: NEGATIVE
Spec Grav, UA: 1.025
Urobilinogen, UA: NEGATIVE
pH, UA: 6

## 2015-11-02 LAB — POCT UA - MICROSCOPIC ONLY
Bacteria, U Microscopic: NEGATIVE
Casts, Ur, LPF, POC: NEGATIVE
Crystals, Ur, HPF, POC: NEGATIVE
RBC, urine, microscopic: NEGATIVE
Yeast, UA: NEGATIVE

## 2015-11-02 MED ORDER — INSULIN GLARGINE 100 UNIT/ML SOLOSTAR PEN: PEN_INJECTOR | SUBCUTANEOUS | Status: DC

## 2015-11-02 MED ORDER — NITROFURANTOIN MACROCRYSTAL 100 MG PO CAPS: 100.0000 mg | ORAL_CAPSULE | Freq: Four times a day (QID) | ORAL | Status: DC

## 2015-11-02 NOTE — Patient Instructions (Addendum)
Set up appointment soon for diabetes check up.  Happy Thanksgiving!

## 2015-11-02 NOTE — Progress Notes (Signed)
Subjective:  Patient ID: Rhonda Thomas, female    DOB: [birth year]  Age: 59 y.o. MRN: [id number]  CC: Urinary Tract Infection   HPI Rhonda Thomas presents for 2 days of frequency and dysuria. Some urgency as well. She's noticed some odor to her urine. She does get urinary tract infections fairly frequently and current symptoms are pretty typical for that. She denies any fever chills sweats nausea or flank pain.  History Rhonda Thomas has a past medical history of Diabetes ([place]); Anxiety; Movement disorder; Neuropathy ([place]); Attention deficit disorder (ADD); Depression; Sleep apnea; Schatzki's ring; and Hiatal hernia.   She has past surgical history that includes Cholecystectomy; Tubal ligation; Abdominal hysterectomy; Toe amputation (Right); and Rectocele repair.   Her family history includes Diabetes in an other family member; Heart Problems in an other family member; Stroke in her mother. There is no history of Colon cancer.She reports that she has never smoked. She has never used smokeless tobacco. She reports that she does not drink alcohol or use illicit drugs.  Outpatient Prescriptions Prior to Visit  Medication Sig Dispense Refill  . acetaminophen (TYLENOL) 500 MG tablet Take 500 mg by mouth every 6 (six) hours as needed.    . carbidopa-levodopa (SINEMET IR) 25-100 MG per tablet Take 1 tablet by mouth 3 (three) times daily. 90 tablet 6  . Dulaglutide (TRULICITY) 1.5 0000000 SOPN Inject 1.5 mg into the skin once a week. 2 mL 6  . fluconazole (DIFLUCAN) 150 MG tablet     . fluconazole (DIFLUCAN) 150 MG tablet TAKE 1 TABLET BY MOUTH ONCE 6 tablet 3  . GLOBAL EASE INJECT PEN NEEDLES 31G X 5 MM MISC USE FOUR TIMES DAILY 100 each 3  . glucose blood (ONETOUCH VERIO) test strip Test bid, DX E11.40 100 each 3  . HYDROcodone-acetaminophen (NORCO) 10-325 MG per tablet Take 1-2 tablets by mouth every 4 (four) hours as needed.     Astrid Drafts Omega-3 300 MG CAPS Take 2 capsules by mouth 2 (two) times  daily. 100 capsule prn  . Lancets (ONETOUCH ULTRASOFT) lancets CHECK DAILY AND AS NEEDED 100 each 3  . LANTUS SOLOSTAR 100 UNIT/ML Solostar Pen INJECT 20 UNITS DAILY. (THIS IS A 75 DAY SUPPLY) APPOINTMENT NEEDED FOR FURTHER REFILLS 15 mL 2  . LYRICA 150 MG capsule TAKE 1 CAPSULE BY MOUTH THREE TIMES DAILY (COUPON CARD ON FILE BUNAVAIL CARD) 90 capsule 5  . metFORMIN (GLUCOPHAGE-XR) 500 MG 24 hr tablet TAKE 1 TABLET BY MOUTH TWICE DAILY 60 tablet 5  . NOVOLOG FLEXPEN 100 UNIT/ML FlexPen INJECT 3 TO 5 UNITS INTO THE SKIN 3 TIMES DAILY WITH MEALS. THIS IS A 100 DAY SUPPLY 15 mL 1  . omeprazole (PRILOSEC) 40 MG capsule Take 1 capsule (40 mg total) by mouth daily. 30 capsule 3  . OXcarbazepine (TRILEPTAL) 150 MG tablet TAKE 1 TABLET BY MOUTH EVERY MORNING & TAKE 2 TABLETS BY MOUTH AT BEDTIME 90 tablet 3  . PREMARIN 1.25 MG tablet TAKE 1 TABLET BY MOUTH DAILY 30 tablet 11  . UNIFINE PENTIPS 31G X 6 MM MISC     . zolpidem (AMBIEN) 10 MG tablet TAKE 1/2-1 TABLET BY MOUTH AT BEDTIME AS NEEDED SLEEP 30 tablet 2  . amoxicillin-clavulanate (AUGMENTIN) 875-125 MG per tablet Take 1 tablet by mouth 2 (two) times daily. Take all of this medication (Patient not taking: Reported on 11/02/2015) 20 tablet 0  . guaiFENesin (MUCINEX) 600 MG 12 hr tablet Take by mouth 2 (two) times daily.    Marland Kitchen  predniSONE (DELTASONE) 10 MG tablet Take 5 daily for 3 days followed by 4,3,2 and 1 for 3 days each. (Patient not taking: Reported on 11/02/2015) 45 tablet 0   No facility-administered medications prior to visit.    ROS Review of Systems  Constitutional: Negative for fever, chills and diaphoresis.  HENT: Negative for congestion.   Eyes: Negative for visual disturbance.  Respiratory: Negative for cough and shortness of breath.   Cardiovascular: Negative for chest pain and palpitations.  Gastrointestinal: Negative for nausea, diarrhea and constipation.  Genitourinary: Positive for dysuria, urgency and frequency. Negative for  hematuria, flank pain, decreased urine volume, menstrual problem and pelvic pain.  Musculoskeletal: Negative for joint swelling and arthralgias.  Skin: Negative for rash.  Neurological: Negative for dizziness and numbness.    Objective:  BP 115/70 mmHg  Pulse 78  Temp(Src) 97.8 F (36.6 C) (Oral)  Ht 5\' 9"  (1.753 m)  Wt 195 lb (88.451 kg)  BMI 28.78 kg/m2  SpO2 98%  BP Readings from Last 3 Encounters:  11/02/15 115/70  08/03/15 124/80  07/31/15 123/81    Wt Readings from Last 3 Encounters:  11/02/15 195 lb (88.451 kg)  08/03/15 191 lb (86.637 kg)  07/31/15 190 lb 9.6 oz (86.456 kg)     Physical Exam  Constitutional: She is oriented to person, place, and time. [name].  HENT:  Head: Normocephalic and atraumatic.  Cardiovascular: Normal rate and regular rhythm.   No murmur heard. Pulmonary/Chest: Effort normal and breath sounds normal.  Abdominal: Soft. Bowel sounds are normal. She exhibits no mass. There is tenderness (mild at the suprapubic region). There is no rebound and no guarding.  Neurological: She is alert and oriented to person, place, and time.  Skin: Skin is warm and dry.  Psychiatric: She has a normal mood and affect. [name].    Lab Results  Component Value Date   HGBA1C 5.8 07/31/2015   HGBA1C 6.6 08/15/2014   HGBA1C 6.6 04/23/2014    Lab Results  Component Value Date   WBC 9.2 07/31/2015   HGB 13.4 04/21/2015   HCT 40.5 07/31/2015   PLT 255 12/05/2014   GLUCOSE 140* 07/31/2015   CHOL 140 07/31/2015   TRIG 253* 07/31/2015   HDL 48 07/31/2015   LDLDIRECT 83 09/17/2014   LDLCALC 41 07/31/2015   ALT 10 07/31/2015   AST 13 07/31/2015   NA 138 07/31/2015   K 4.9 07/31/2015   CL 97 07/31/2015   CREATININE 0.64 07/31/2015   BUN 9 07/31/2015   CO2 25 07/31/2015   HGBA1C 5.8 07/31/2015   MICROALBUR 0.50 06/21/2013    Dg Chest Port 1 View  02/25/2015  CLINICAL DATA:  Post right-sided PICC  line placement EXAM: PORTABLE CHEST - 1 VIEW COMPARISON:  01/05/2015; 12/29/2014; chest CT - 01/05/2015 FINDINGS: Grossly unchanged cardiac silhouette and mediastinal contours given slightly reduced lung volumes. Interval placement of right upper extremity approach PICC line with tip projected of the superior cavoatrial junction. No focal airspace opacities. No pleural effusion or pneumothorax. No evidence of edema. No acute osseus abnormalities. IMPRESSION: 1. Right upper extremity approach PICC line tip projects over the superior cavoatrial junction. 2.  No acute cardiopulmonary disease. Electronically Signed   By: Sandi Mariscal M.D.   On: 02/25/2015 09:49    Assessment & Plan:   Rhonda Thomas was seen today for urinary tract infection.  Diagnoses and all orders for this visit:  Cystitis  Dysuria -  POCT urinalysis dipstick -     POCT UA - Microscopic Only  Other orders -     nitrofurantoin (MACRODANTIN) 100 MG capsule; Take 1 capsule (100 mg total) by mouth 4 (four) times daily.   I have discontinued Rhonda Thomas's guaiFENesin, amoxicillin-clavulanate, and predniSONE. I am also having her start on nitrofurantoin. Additionally, I am having her maintain her onetouch ultrasoft, HYDROcodone-acetaminophen, UNIFINE PENTIPS, carbidopa-levodopa, Dulaglutide, omeprazole, NOVOLOG FLEXPEN, OXcarbazepine, acetaminophen, fluconazole, Krill Oil Omega-3, metFORMIN, PREMARIN, zolpidem, LYRICA, LANTUS SOLOSTAR, glucose blood, GLOBAL EASE INJECT PEN NEEDLES, and fluconazole.  Meds ordered this encounter  Medications  . nitrofurantoin (MACRODANTIN) 100 MG capsule    Sig: Take 1 capsule (100 mg total) by mouth 4 (four) times daily.    Dispense:  14 capsule    Refill:  0     Follow-up: Return if symptoms worsen or fail to improve. You are also due for a check up ofr diabetes Claretta Fraise, M.D.

## 2015-11-02 NOTE — Addendum Note (Signed)
Addended by: Marin Olp on: 11/02/2015 01:20 PM   Modules accepted: Orders, SmartSet

## 2015-11-03 ENCOUNTER — Ambulatory Visit: Payer: BC Managed Care – PPO | Admitting: Neurology

## 2015-11-09 ENCOUNTER — Ambulatory Visit (INDEPENDENT_AMBULATORY_CARE_PROVIDER_SITE_OTHER): Payer: BC Managed Care – PPO | Admitting: Psychology

## 2015-11-09 ENCOUNTER — Encounter (HOSPITAL_COMMUNITY): Payer: Self-pay | Admitting: Psychology

## 2015-11-09 ENCOUNTER — Other Ambulatory Visit: Payer: Self-pay | Admitting: Nurse Practitioner

## 2015-11-09 DIAGNOSIS — F332 Major depressive disorder, recurrent severe without psychotic features: Secondary | ICD-10-CM

## 2015-11-09 DIAGNOSIS — F419 Anxiety disorder, unspecified: Secondary | ICD-10-CM

## 2015-11-09 NOTE — Progress Notes (Signed)
Patient ID: Rhonda Thomas, female   DOB: 05-11-1956, 59 y.o.   MRN: [id number] [name]   DOB: [birth year]  MR Number: [id number]  Location: [facility] [place] [address] Dept: [phone]  Start: 4 PM End: 5 PM  Provider/Observer:     Edgardo Roys PSYD  Chief Complaint:      Chief Complaint  Patient presents with  . Anxiety  . Depression  . Stress    Reason For Service:     I seen the patient is also known to 4 several years. She was initially referred here for difficulty coping with the situation involving her children. Her youngest son is at severe difficulties over the years with regard to mood disorder, substance abuse and behavioral problems. Now there are major stressors associated with him again the patient has been essentially overwhelmed and unsure about what to do about. She reports that this has created a lot of stress both at home and at work. On top of that, her diabetes has gotten to the point that she had hammertoe amputated because of that. She has neuropathy as a result of diabetes as well and has had to stay out of her foot for 8 weeks.  Interventions Strategy:  Cognitive/behavioral psychotherapeutic interventions  Participation Level:   Active  Participation Quality:  Appropriate      Behavioral Observation:  Well Groomed, Alert, and Depressed.   Current Psychosocial Factors: The patient reports that her son has moved band with his ex gf.  This has been a very bad situation in the past, but the patient was having too much stress with son living at her house.  The patient reports that she worries about her son, but knows that she can not have him living with her.  Content of Session:   Review current symptoms and continued work on building improve coping skills  Current Status:   The patient reports that depression has improved and she is coping  much better now that her medical issues with foot have stabilized and the son has now moved back out of the house.  Last Reviewed:   11/09/2015  Goals Addressed Today:    Goals addressed included building better coping skills.  Impression/Diagnosis:   The patient has a long history of attention deficit disorder but do to Maj. psychosocial stressors she also developed clinical depression and anxiety. I think that her attentional problems were valid prior to the development of these issues that existed her entire life. However, she is in and repeat if and recurrent trauma with regard primarily to her son and to her daughter to a lesser degree in years past.  Diagnosis:    Axis I:  Major depressive disorder, recurrent, severe without psychotic features ([place])  Anxiety      Axis II: No diagnosis      Gabrianna Fassnacht R, PsyD 11/09/2015

## 2015-11-12 ENCOUNTER — Other Ambulatory Visit: Payer: Self-pay | Admitting: Family Medicine

## 2015-11-14 ENCOUNTER — Ambulatory Visit (INDEPENDENT_AMBULATORY_CARE_PROVIDER_SITE_OTHER): Payer: BC Managed Care – PPO | Admitting: Pediatrics

## 2015-11-14 VITALS — BP 126/79 | HR 68 | Temp 97.8°F | Ht 69.0 in | Wt 194.0 lb

## 2015-11-14 DIAGNOSIS — S71011A Laceration without foreign body, right hip, initial encounter: Secondary | ICD-10-CM

## 2015-11-14 NOTE — Patient Instructions (Signed)

## 2015-11-14 NOTE — Progress Notes (Signed)
Subjective:    Patient ID: Rhonda Thomas, female    DOB: [birth year], 59 y.o.   MRN: [id number]  CC: hip laceration   HPI: Rhonda Thomas is a 59 y.o. female presenting for hip laceration  Was painting the top of a wall, fell onto a glass table Cut through her nightgown Washed it with a washcloth Does not think any glass pieces could have been in the wound  Relevant past medical, surgical, family and social history reviewed and updated as indicated. Interim medical history since our last visit reviewed. Allergies and medications reviewed and updated.    ROS: Per HPI unless specifically indicated above  History  Smoking status  . Never Smoker   Smokeless tobacco  . Never Used    Past Medical History Patient Active Problem List   Diagnosis Date Noted  . Somnolence, daytime 03/10/2015  . Generalized anxiety disorder 06/21/2013  . Attention deficit disorder without mention of hyperactivity 06/21/2013  . Hyperlipidemia 06/21/2013  . Insomnia 06/21/2013  . Restless legs syndrome (RLS) 06/20/2013  . Type 2 diabetes mellitus with diabetic neuropathy ([place]) 06/20/2013    Current Outpatient Prescriptions  Medication Sig Dispense Refill  . acetaminophen (TYLENOL) 500 MG tablet Take 500 mg by mouth every 6 (six) hours as needed.    . carbidopa-levodopa (SINEMET IR) 25-100 MG per tablet Take 1 tablet by mouth 3 (three) times daily. 90 tablet 6  . GLOBAL EASE INJECT PEN NEEDLES 31G X 5 MM MISC USE FOUR TIMES DAILY 100 each 3  . glucose blood (ONETOUCH VERIO) test strip Test bid, DX E11.40 100 each 3  . HYDROcodone-acetaminophen (NORCO) 10-325 MG per tablet Take 1-2 tablets by mouth every 4 (four) hours as needed.     . Insulin Glargine (LANTUS SOLOSTAR) 100 UNIT/ML Solostar Pen Inject 20 units daily 15 mL 0  . Krill Oil Omega-3 300 MG CAPS Take 2 capsules by mouth 2 (two) times daily. 100 capsule prn  . Lancets (ONETOUCH ULTRASOFT) lancets CHECK DAILY AND AS NEEDED 100 each 3  .  LYRICA 150 MG capsule TAKE 1 CAPSULE BY MOUTH THREE TIMES DAILY (COUPON CARD ON FILE BUNAVAIL CARD) 90 capsule 5  . metFORMIN (GLUCOPHAGE-XR) 500 MG 24 hr tablet TAKE 1 TABLET BY MOUTH TWICE DAILY 60 tablet 5  . NOVOLOG FLEXPEN 100 UNIT/ML FlexPen INJECT 3 TO 5 UNITS INTO THE SKIN 3 TIMES DAILY WITH MEALS. THIS IS A 100 DAY SUPPLY 15 mL 1  . omeprazole (PRILOSEC) 40 MG capsule Take 1 capsule (40 mg total) by mouth daily. 30 capsule 3  . OXcarbazepine (TRILEPTAL) 150 MG tablet TAKE 1 TABLET BY MOUTH EVERY MORNING & TAKE 2 TABLETS BY MOUTH AT BEDTIME 90 tablet 0  . PREMARIN 1.25 MG tablet TAKE 1 TABLET BY MOUTH DAILY 30 tablet 11  . TRULICITY 1.5 0000000 SOPN INJECT 1.5 MG INTO THE SKIN ONCE A WEEK. 2 mL 2  . UNIFINE PENTIPS 31G X 6 MM MISC     . zolpidem (AMBIEN) 10 MG tablet TAKE 1/2-1 TABLET BY MOUTH AT BEDTIME AS NEEDED SLEEP 30 tablet 2  . fluconazole (DIFLUCAN) 150 MG tablet      No current facility-administered medications for this visit.       Objective:    BP 126/79 mmHg  Pulse 68  Temp(Src) 97.8 F (36.6 C) (Oral)  Ht 5\' 9"  (1.753 m)  Wt 194 lb (87.998 kg)  BMI 28.64 kg/m2  Wt Readings from Last 3 Encounters:  11/14/15 194 lb (  87.998 kg)  11/02/15 195 lb (88.451 kg)  08/03/15 191 lb (86.637 kg)     Gen: NAD, alert, cooperative with exam, NCAT EYES: EOMI, no scleral injection or icterus LYMPH: no cervical LAD CV: WWP Resp: normal WOB Neuro: Alert and oriented\ Skin: R lateral hip with 8cm slightly curved laceration, 23mm gap in proximal section, well approximated distal 2 cm, apprx 3 mm deep in proximal section. Several other superficial abrasions on hip.     Assessment & Plan:    Diagnoses and all orders for this visit:  Laceration of hip, right, initial encounter Clean wound, no discharge or signs of infection, some filaments of fiber in wound that were easily removable with washing. No other foreign bodies. Will use sutures to repair, see separate  note    PROCEDURE NOTE:  Verbal consent given by patient. Cleaned with Saline Local anesthesia Lidocaine 1% with epi 40ml Betadine prep 5-0 vicryl 3 dermal sutures 4-0 monofilament suture-8 simple interrupted stitches Antibiotic ointment Dressing applied Instructions given  Follow up plan: Return in about 10 days (around 11/24/2015) for suture removal.  Assunta Found, MD [facility] 11/14/2015, 10:23 AM

## 2015-11-23 ENCOUNTER — Ambulatory Visit (INDEPENDENT_AMBULATORY_CARE_PROVIDER_SITE_OTHER): Payer: BC Managed Care – PPO | Admitting: Psychology

## 2015-11-23 DIAGNOSIS — F419 Anxiety disorder, unspecified: Secondary | ICD-10-CM

## 2015-11-23 DIAGNOSIS — F332 Major depressive disorder, recurrent severe without psychotic features: Secondary | ICD-10-CM

## 2015-11-24 ENCOUNTER — Other Ambulatory Visit: Payer: Self-pay | Admitting: *Deleted

## 2015-11-24 DIAGNOSIS — E1140 Type 2 diabetes mellitus with diabetic neuropathy, unspecified: Secondary | ICD-10-CM

## 2015-11-25 ENCOUNTER — Ambulatory Visit (INDEPENDENT_AMBULATORY_CARE_PROVIDER_SITE_OTHER): Payer: BC Managed Care – PPO | Admitting: Pediatrics

## 2015-11-25 ENCOUNTER — Encounter: Payer: Self-pay | Admitting: Pediatrics

## 2015-11-25 VITALS — BP 113/74 | HR 70 | Temp 97.1°F | Ht 69.0 in | Wt 194.2 lb

## 2015-11-25 DIAGNOSIS — Z1239 Encounter for other screening for malignant neoplasm of breast: Secondary | ICD-10-CM

## 2015-11-25 DIAGNOSIS — Z4802 Encounter for removal of sutures: Secondary | ICD-10-CM | POA: Diagnosis not present

## 2015-11-25 DIAGNOSIS — E1140 Type 2 diabetes mellitus with diabetic neuropathy, unspecified: Secondary | ICD-10-CM

## 2015-11-25 LAB — POCT GLYCOSYLATED HEMOGLOBIN (HGB A1C): Hemoglobin A1C: 6.1

## 2015-11-25 NOTE — Progress Notes (Signed)
Subjective:    Patient ID: Rhonda Thomas, female    DOB: [birth year], 59 y.o.   MRN: [id number]  CC: Suture / Staple Removal   HPI: Rhonda Thomas is a 59 y.o. female presenting for Suture / Staple Removal  Sutures placed 10 days ago. Area healing well A couple days after sutures placed started having sharp stabbing pain in distal area of wound, she pulled something crusty out, was wondering if it was glass. Sharp pain resolved after that. No Further pain. No redness, soreness or tenderness.   Depression screen [facility] 2/9 11/25/2015 04/21/2015 01/22/2015 01/07/2015 04/28/2014  Decreased Interest 0 0 0 0 0  Down, Depressed, Hopeless 0 0 0 0 0  PHQ - 2 Score 0 0 0 0 0     Relevant past medical, surgical, family and social history reviewed and updated as indicated. Interim medical history since our last visit reviewed. Allergies and medications reviewed and updated.    ROS: Per HPI unless specifically indicated above  History  Smoking status  . Never Smoker   Smokeless tobacco  . Never Used    Past Medical History Patient Active Problem List   Diagnosis Date Noted  . Somnolence, daytime 03/10/2015  . Generalized anxiety disorder 06/21/2013  . Attention deficit disorder without mention of hyperactivity 06/21/2013  . Hyperlipidemia 06/21/2013  . Insomnia 06/21/2013  . Restless legs syndrome (RLS) 06/20/2013  . Type 2 diabetes mellitus with diabetic neuropathy ([place]) 06/20/2013    Current Outpatient Prescriptions  Medication Sig Dispense Refill  . acetaminophen (TYLENOL) 500 MG tablet Take 500 mg by mouth every 6 (six) hours as needed.    . carbidopa-levodopa (SINEMET IR) 25-100 MG per tablet Take 1 tablet by mouth 3 (three) times daily. 90 tablet 6  . fluconazole (DIFLUCAN) 150 MG tablet     . GLOBAL EASE INJECT PEN NEEDLES 31G X 5 MM MISC USE FOUR TIMES DAILY 100 each 3  . glucose blood (ONETOUCH VERIO) test strip Test bid, DX E11.40 100 each 3  . HYDROcodone-acetaminophen  (NORCO) 10-325 MG per tablet Take 1-2 tablets by mouth every 4 (four) hours as needed.     . Insulin Glargine (LANTUS SOLOSTAR) 100 UNIT/ML Solostar Pen Inject 20 units daily 15 mL 0  . Krill Oil Omega-3 300 MG CAPS Take 2 capsules by mouth 2 (two) times daily. 100 capsule prn  . Lancets (ONETOUCH ULTRASOFT) lancets CHECK DAILY AND AS NEEDED 100 each 3  . LYRICA 150 MG capsule TAKE 1 CAPSULE BY MOUTH THREE TIMES DAILY (COUPON CARD ON FILE BUNAVAIL CARD) 90 capsule 5  . metFORMIN (GLUCOPHAGE-XR) 500 MG 24 hr tablet TAKE 1 TABLET BY MOUTH TWICE DAILY 60 tablet 5  . NOVOLOG FLEXPEN 100 UNIT/ML FlexPen INJECT 3 TO 5 UNITS INTO THE SKIN 3 TIMES DAILY WITH MEALS. THIS IS A 100 DAY SUPPLY 15 mL 1  . omeprazole (PRILOSEC) 40 MG capsule Take 1 capsule (40 mg total) by mouth daily. 30 capsule 3  . OXcarbazepine (TRILEPTAL) 150 MG tablet TAKE 1 TABLET BY MOUTH EVERY MORNING & TAKE 2 TABLETS BY MOUTH AT BEDTIME 90 tablet 0  . PREMARIN 1.25 MG tablet TAKE 1 TABLET BY MOUTH DAILY 30 tablet 11  . TRULICITY 1.5 0000000 SOPN INJECT 1.5 MG INTO THE SKIN ONCE A WEEK. 2 mL 2  . UNIFINE PENTIPS 31G X 6 MM MISC     . zolpidem (AMBIEN) 10 MG tablet TAKE 1/2-1 TABLET BY MOUTH AT BEDTIME AS NEEDED SLEEP 30 tablet  2   No current facility-administered medications for this visit.       Objective:    BP 113/74 mmHg  Pulse 70  Temp(Src) 97.1 F (36.2 C) (Oral)  Ht 5\' 9"  (1.753 m)  Wt 194 lb 3.2 oz (88.089 kg)  BMI 28.67 kg/m2  Wt Readings from Last 3 Encounters:  11/25/15 194 lb 3.2 oz (88.089 kg)  11/14/15 194 lb (87.998 kg)  11/02/15 195 lb (88.451 kg)     Gen: NAD, alert, cooperative with exam, NCAT EYES: EOMI, no scleral injection or icterus CV: WWP Resp:  normal WOB Ext: No edema, warm Neuro: Alert and oriented Skin: no redness or irritation along laceration line, well healing and well-approximated wound. No hardness or foreign bodies palpated under surface of wound.     Assessment & Plan:     Sybol was seen today for suture / staple removal.  Diagnoses and all orders for this visit:  Visit for suture removal  Type 2 diabetes mellitus with diabetic neuropathy, unspecified long term insulin use status ([employer]) -     POCT glycosylated hemoglobin (Hb A1C) -     Microalbumin / creatinine urine ratio  Screening for breast cancer -     MM Digital Screening; Future  PROCEDURE: All sutures removed form well-healing laceration R hip. Minimal bleeding at distal end, very superficial, <66mm deep, apprx 28mm wide superficial area of bleeding  Follow up plan: 2 weeks for chronic med problems  Assunta Found, MD [facility] 11/25/2015, 10:04 AM

## 2015-11-26 LAB — MICROALBUMIN / CREATININE URINE RATIO
Creatinine, Urine: 158.5 mg/dL
MICROALB/CREAT RATIO: <1.9 mg/g creat (ref 0.0–30.0)
Microalbumin, Urine: <3 ug/mL

## 2015-12-01 ENCOUNTER — Encounter (HOSPITAL_COMMUNITY): Payer: Self-pay | Admitting: Psychology

## 2015-12-01 NOTE — Progress Notes (Signed)
Patient ID: [name], female   DOB: [birth year], 59 y.o.   MRN: [id number] [name]   DOB: 05/16/1956  MR Number: [id number]  Location: [facility] [place] [address] Dept: [phone]  Start: 4 PM End: 5 PM  Provider/Observer:     Edgardo Roys PSYD  Chief Complaint:      Chief Complaint  Patient presents with  . Anxiety  . Depression  . Stress    Reason For Service:     I seen the patient is also known to 4 several years. She was initially referred here for difficulty coping with the situation involving her children. Her youngest son is at severe difficulties over the years with regard to mood disorder, substance abuse and behavioral problems. Now there are major stressors associated with him again the patient has been essentially overwhelmed and unsure about what to do about. She reports that this has created a lot of stress both at home and at work. On top of that, her diabetes has gotten to the point that she had hammertoe amputated because of that. She has neuropathy as a result of diabetes as well and has had to stay out of her foot for 8 weeks.  Interventions Strategy:  Cognitive/behavioral psychotherapeutic interventions  Participation Level:   Active  Participation Quality:  Appropriate      Behavioral Observation:  Well Groomed, Alert, and Depressed.   Current Psychosocial Factors: The patient reports that her son has moved band with his ex gf.  This has been a very bad situation in the past, but the patient was having too much stress with son living at her house.  The patient reports that she worries about her son, but knows that she can not have him living with her.  Content of Session:   Review current symptoms and continued work on building improve coping skills  Current Status:   The patient reports that depression has improved and she is coping  much better now that her medical issues with foot have stabilized and the son has now moved back out of the house.  Last Reviewed:   11/23/2015  Goals Addressed Today:    Goals addressed included building better coping skills.  Impression/Diagnosis:   The patient has a long history of attention deficit disorder but do to Maj. psychosocial stressors she also developed clinical depression and anxiety. I think that her attentional problems were valid prior to the development of these issues that existed her entire life. However, she is in and repeat if and recurrent trauma with regard primarily to her son and to her daughter to a lesser degree in years past.  Diagnosis:    Axis I:  Major depressive disorder, recurrent, severe without psychotic features ([place])  Anxiety      Axis II: No diagnosis      RODENBOUGH,JOHN R, PsyD 12/01/2015

## 2015-12-08 ENCOUNTER — Other Ambulatory Visit: Payer: Self-pay | Admitting: Nurse Practitioner

## 2015-12-08 ENCOUNTER — Other Ambulatory Visit: Payer: Self-pay | Admitting: Family Medicine

## 2015-12-09 NOTE — Telephone Encounter (Signed)
Last seen 11/25/15  Dr Evette Doffing  If approved route to nurse to call into [facility]   [phone]

## 2015-12-09 NOTE — Telephone Encounter (Signed)
Forwarding to Dr. Livia Snellen

## 2015-12-10 ENCOUNTER — Ambulatory Visit (INDEPENDENT_AMBULATORY_CARE_PROVIDER_SITE_OTHER): Payer: BC Managed Care – PPO | Admitting: Psychology

## 2015-12-10 ENCOUNTER — Encounter (HOSPITAL_COMMUNITY): Payer: Self-pay | Admitting: Psychology

## 2015-12-10 DIAGNOSIS — F332 Major depressive disorder, recurrent severe without psychotic features: Secondary | ICD-10-CM | POA: Diagnosis not present

## 2015-12-10 NOTE — Progress Notes (Signed)
Patient ID: Rhonda Thomas, female   DOB: 1956/07/22, 59 y.o.   MRN: [id number] [name]   DOB: 1956-07-25  MR Number: [id number]  Location: [place] [address] [place] [address] Dept: [phone]  Start:  11 AM End:  12 PM  Provider/Observer:     Edgardo Roys PSYD  Chief Complaint:      Chief Complaint  Patient presents with  . Depression  . Anxiety  . Stress    Reason For Service:     I seen the patient is also known to 4 several years. She was initially referred here for difficulty coping with the situation involving her children. Her youngest son is at severe difficulties over the years with regard to mood disorder, substance abuse and behavioral problems. Now there are major stressors associated with him again the patient has been essentially overwhelmed and unsure about what to do about. She reports that this has created a lot of stress both at home and at work. On top of that, her diabetes has gotten to the point that she had hammertoe amputated because of that. She has neuropathy as a result of diabetes as well and has had to stay out of her foot for 8 weeks.  Interventions Strategy:  Cognitive/behavioral psychotherapeutic interventions  Participation Level:   Active  Participation Quality:  Appropriate      Behavioral Observation:  Well Groomed, Alert, and Depressed.   Current Psychosocial Factors: The patient reports that  She did little bit better over the holidays. While her son was still in and out of the house at various times and others going back to his girlfriend that she is handling the stress associated with them better. She reports that he is continuing to abuse  Narcotic medications and knows that he is doing it intravenously at times. She reports that this gives her a great fear and apprehension about him dying but realizes that there is nothing that she  can do and has to take care of herself first.  Content of Session:   Review current symptoms and continued work on building improve coping skills  Current Status:   The patient reports that depression has  Continued to be improved over the first level but it is been. However, she reports continued significant stress. She is coping much better with her severe diabetes and carefully monitoring her blood glucose levels and other issues associated with her diabetes.   Last Reviewed:   12/ 28/2016  Goals Addressed Today:    Goals addressed included building better coping skills.  Impression/Diagnosis:   The patient has a long history of attention deficit disorder but do to Maj. psychosocial stressors she also developed clinical depression and anxiety. I think that her attentional problems were valid prior to the development of these issues that existed her entire life. However, she is in and repeat if and recurrent trauma with regard primarily to her son and to her daughter to a lesser degree in years past.  Diagnosis:    Axis I:  Major depressive disorder, recurrent, severe without psychotic features ([place])      Axis II: No diagnosis      Denya Buckingham R, PsyD 12/10/2015

## 2015-12-11 NOTE — Telephone Encounter (Signed)
Called into [facility] 

## 2015-12-24 ENCOUNTER — Ambulatory Visit (INDEPENDENT_AMBULATORY_CARE_PROVIDER_SITE_OTHER): Payer: BC Managed Care – PPO | Admitting: Psychology

## 2015-12-24 DIAGNOSIS — F332 Major depressive disorder, recurrent severe without psychotic features: Secondary | ICD-10-CM | POA: Diagnosis not present

## 2015-12-24 DIAGNOSIS — F419 Anxiety disorder, unspecified: Secondary | ICD-10-CM | POA: Diagnosis not present

## 2015-12-30 ENCOUNTER — Telehealth: Payer: Self-pay | Admitting: Family Medicine

## 2015-12-30 NOTE — Telephone Encounter (Signed)
scheduled

## 2016-01-04 ENCOUNTER — Other Ambulatory Visit: Payer: Self-pay | Admitting: Nurse Practitioner

## 2016-01-04 ENCOUNTER — Other Ambulatory Visit: Payer: Self-pay | Admitting: Family Medicine

## 2016-01-05 ENCOUNTER — Other Ambulatory Visit: Payer: Self-pay

## 2016-01-05 MED ORDER — INSULIN GLARGINE 100 UNIT/ML SOLOSTAR PEN: PEN_INJECTOR | SUBCUTANEOUS | Status: DC

## 2016-01-05 MED ORDER — OXCARBAZEPINE 150 MG PO TABS: ORAL_TABLET | ORAL | Status: DC

## 2016-01-06 ENCOUNTER — Ambulatory Visit (INDEPENDENT_AMBULATORY_CARE_PROVIDER_SITE_OTHER): Payer: BC Managed Care – PPO | Admitting: Psychology

## 2016-01-06 DIAGNOSIS — F419 Anxiety disorder, unspecified: Secondary | ICD-10-CM

## 2016-01-06 DIAGNOSIS — F332 Major depressive disorder, recurrent severe without psychotic features: Secondary | ICD-10-CM | POA: Diagnosis not present

## 2016-01-07 ENCOUNTER — Encounter (HOSPITAL_COMMUNITY): Payer: Self-pay | Admitting: Psychology

## 2016-01-07 NOTE — Progress Notes (Signed)
Patient ID: Rhonda Thomas, female   DOB: 1956-09-05, 60 y.o.   MRN: [id number] [name]   DOB: Jul 20, 1956  MR Number: [id number]  Location: [place] [facility] [address] [place] [address] Dept: [phone]  Start:  11 AM End:  12 PM  Provider/Observer:     Edgardo Roys PSYD  Chief Complaint:      Chief Complaint  Patient presents with  . Depression  . Anxiety    Reason For Service:     I seen the patient is also known to 4 several years. She was initially referred here for difficulty coping with the situation involving her children. Her youngest son is at severe difficulties over the years with regard to mood disorder, substance abuse and behavioral problems. Now there are major stressors associated with him again the patient has been essentially overwhelmed and unsure about what to do about. She reports that this has created a lot of stress both at home and at work. On top of that, her diabetes has gotten to the point that she had hammertoe amputated because of that. She has neuropathy as a result of diabetes as well and has had to stay out of her foot for 8 weeks.  Interventions Strategy:  Cognitive/behavioral psychotherapeutic interventions  Participation Level:   Active  Participation Quality:  Appropriate      Behavioral Observation:  Well Groomed, Alert, and Depressed.   Current Psychosocial Factors: The patient reports that  She did little bit better over the holidays. While her son was still in and out of the house at various times and others going back to his girlfriend that she is handling the stress associated with them better. She reports that he is continuing to abuse  Narcotic medications and knows that he is doing it intravenously at times. She reports that this gives her a great fear and apprehension about him dying but realizes that there is nothing that she can do and  has to take care of herself first.  Content of Session:   Review current symptoms and continued work on building improve coping skills  Current Status:   The patient reports that depression has  Continued to be improved over the first level but it is been. However, she reports continued significant stress. She is coping much better with her severe diabetes and carefully monitoring her blood glucose levels and other issues associated with her diabetes.   Last Reviewed:   01/06/2016  Goals Addressed Today:    Goals addressed included building better coping skills.  Impression/Diagnosis:   The patient has a long history of attention deficit disorder but do to Maj. psychosocial stressors she also developed clinical depression and anxiety. I think that her attentional problems were valid prior to the development of these issues that existed her entire life. However, she is in and repeat if and recurrent trauma with regard primarily to her son and to her daughter to a lesser degree in years past.  Diagnosis:    Axis I:  Major depressive disorder, recurrent, severe without psychotic features ([place])  Anxiety      Axis II: No diagnosis      Rhonda Villeda R, PsyD 01/07/2016

## 2016-01-12 ENCOUNTER — Other Ambulatory Visit: Payer: Self-pay | Admitting: Family Medicine

## 2016-01-13 ENCOUNTER — Other Ambulatory Visit: Payer: Self-pay | Admitting: Family Medicine

## 2016-01-14 ENCOUNTER — Telehealth: Payer: Self-pay

## 2016-01-14 NOTE — Telephone Encounter (Signed)
Insurance prior authorized Zolpidem through 01/13/19

## 2016-01-19 ENCOUNTER — Telehealth: Payer: Self-pay | Admitting: Family Medicine

## 2016-01-21 ENCOUNTER — Ambulatory Visit (INDEPENDENT_AMBULATORY_CARE_PROVIDER_SITE_OTHER): Payer: BC Managed Care – PPO | Admitting: Psychology

## 2016-01-21 DIAGNOSIS — F332 Major depressive disorder, recurrent severe without psychotic features: Secondary | ICD-10-CM

## 2016-01-21 DIAGNOSIS — F419 Anxiety disorder, unspecified: Secondary | ICD-10-CM | POA: Diagnosis not present

## 2016-02-08 ENCOUNTER — Other Ambulatory Visit: Payer: Self-pay | Admitting: Neurology

## 2016-02-08 ENCOUNTER — Other Ambulatory Visit: Payer: Self-pay | Admitting: Family Medicine

## 2016-02-09 ENCOUNTER — Encounter: Payer: BC Managed Care – PPO | Admitting: *Deleted

## 2016-02-12 ENCOUNTER — Other Ambulatory Visit: Payer: Self-pay | Admitting: Family Medicine

## 2016-02-23 ENCOUNTER — Ambulatory Visit (INDEPENDENT_AMBULATORY_CARE_PROVIDER_SITE_OTHER): Payer: BC Managed Care – PPO | Admitting: Psychology

## 2016-02-23 DIAGNOSIS — F419 Anxiety disorder, unspecified: Secondary | ICD-10-CM | POA: Diagnosis not present

## 2016-02-23 DIAGNOSIS — F332 Major depressive disorder, recurrent severe without psychotic features: Secondary | ICD-10-CM

## 2016-03-02 ENCOUNTER — Other Ambulatory Visit: Payer: Self-pay | Admitting: Family Medicine

## 2016-03-02 DIAGNOSIS — R921 Mammographic calcification found on diagnostic imaging of breast: Secondary | ICD-10-CM

## 2016-03-07 ENCOUNTER — Other Ambulatory Visit: Payer: Self-pay | Admitting: Pediatrics

## 2016-03-08 ENCOUNTER — Ambulatory Visit
Admission: RE | Admit: 2016-03-08 | Discharge: 2016-03-08 | Disposition: A | Discharge status: Home / Self Care | Payer: BC Managed Care – PPO | Source: Ambulatory Visit | Attending: Family Medicine | Admitting: Family Medicine

## 2016-03-08 ENCOUNTER — Other Ambulatory Visit: Payer: Self-pay | Admitting: Family Medicine

## 2016-03-08 DIAGNOSIS — R921 Mammographic calcification found on diagnostic imaging of breast: Secondary | ICD-10-CM

## 2016-03-09 ENCOUNTER — Other Ambulatory Visit: Payer: Self-pay | Admitting: Nurse Practitioner

## 2016-03-10 NOTE — Telephone Encounter (Signed)
LVM informing patient a one month supply of carbidopa/levodopa sent to pharmacy, but she needs to call and schedule FU. Left name, number.

## 2016-03-14 ENCOUNTER — Other Ambulatory Visit: Payer: Self-pay | Admitting: Nurse Practitioner

## 2016-03-14 MED ORDER — CARBIDOPA-LEVODOPA 25-100 MG PO TABS: 1.0000 | ORAL_TABLET | Freq: Three times a day (TID) | ORAL | Status: DC

## 2016-03-16 ENCOUNTER — Ambulatory Visit (INDEPENDENT_AMBULATORY_CARE_PROVIDER_SITE_OTHER): Payer: BC Managed Care – PPO | Admitting: Psychology

## 2016-03-16 ENCOUNTER — Encounter (HOSPITAL_COMMUNITY): Payer: Self-pay | Admitting: Psychology

## 2016-03-16 DIAGNOSIS — F419 Anxiety disorder, unspecified: Secondary | ICD-10-CM | POA: Diagnosis not present

## 2016-03-16 DIAGNOSIS — F332 Major depressive disorder, recurrent severe without psychotic features: Secondary | ICD-10-CM

## 2016-03-16 NOTE — Progress Notes (Signed)
Patient ID: [name], female   DOB: 1956-08-24, 60 y.o.   MRN: [id number] [name]   DOB: [birth year]  MR Number: [id number]  Location: [facility] [address] [place] [address] Dept: [phone]  Start:  11 AM End:  12 PM  Provider/Observer:     Edgardo Roys PSYD  Chief Complaint:      Chief Complaint  Patient presents with  . Anxiety  . Agitation  . Stress  . Depression    Reason For Service:     I seen the patient is also known to 4 several years. She was initially referred here for difficulty coping with the situation involving her children. Her youngest son is at severe difficulties over the years with regard to mood disorder, substance abuse and behavioral problems. Now there are major stressors associated with him again the patient has been essentially overwhelmed and unsure about what to do about. She reports that this has created a lot of stress both at home and at work. On top of that, her diabetes has gotten to the point that she had hammertoe amputated because of that. She has neuropathy as a result of diabetes as well and has had to stay out of her foot for 8 weeks.  Interventions Strategy:  Cognitive/behavioral psychotherapeutic interventions  Participation Level:   Active  Participation Quality:  Appropriate      Behavioral Observation:  Well Groomed, Alert, and Depressed.   Current Psychosocial Factors: The patient reports that her situation with her son has continued to be problematic.  He ODed just before our last visit and the patient had to give him Narcan to save his life after he stopped breathing .  He was in jail after getting out of hospital.  THe patient is now back at her house waiting on going to 90 day treatment vs 75 days in jail.  She is having to deal with his anger and aggitation.  She is likely going to go talk with parole officer about  putting him in jail while waiting to go to treatment program.  Content of Session:   Review current symptoms and continued work on building improve coping skills  Current Status:   The patient reports that depression has  Continued to be improved over the first level but it is been. However, she reports continued significant stress. She is coping much better with her severe diabetes and carefully monitoring her blood glucose levels and other issues associated with her diabetes.   Last Reviewed:   03/16/2016  Goals Addressed Today:    Goals addressed included building better coping skills.  Impression/Diagnosis:   The patient has a long history of attention deficit disorder but do to Maj. psychosocial stressors she also developed clinical depression and anxiety. I think that her attentional problems were valid prior to the development of these issues that existed her entire life. However, she is in and repeat if and recurrent trauma with regard primarily to her son and to her daughter to a lesser degree in years past.  Diagnosis:    Axis I:  Major depressive disorder, recurrent, severe without psychotic features ([place])  Anxiety      Axis II: No diagnosis      RODENBOUGH,JOHN R, PsyD 03/16/2016

## 2016-03-18 ENCOUNTER — Other Ambulatory Visit: Payer: Self-pay | Admitting: General Surgery

## 2016-03-18 DIAGNOSIS — N6021 Fibroadenosis of right breast: Secondary | ICD-10-CM

## 2016-03-29 ENCOUNTER — Encounter (HOSPITAL_COMMUNITY): Payer: Self-pay | Admitting: Psychology

## 2016-03-29 NOTE — Progress Notes (Signed)
Patient ID: [name], female   DOB: Aug 04, 1956, 60 y.o.   MRN: [id number] [name]   DOB: [birth year]  MR Number: [id number]  Location: [place] [facility] [place] [address] [place] [address] Dept: [phone]  Start:  11 AM End:  12 PM  Provider/Observer:     Edgardo Roys PSYD  Chief Complaint:      Chief Complaint  Patient presents with  . Depression  . Anxiety    Reason For Service:     I seen the patient is also known to 4 several years. She was initially referred here for difficulty coping with the situation involving her children. Her youngest son is at severe difficulties over the years with regard to mood disorder, substance abuse and behavioral problems. Now there are major stressors associated with him again the patient has been essentially overwhelmed and unsure about what to do about. She reports that this has created a lot of stress both at home and at work. On top of that, her diabetes has gotten to the point that she had hammertoe amputated because of that. She has neuropathy as a result of diabetes as well and has had to stay out of her foot for 8 weeks.  Interventions Strategy:  Cognitive/behavioral psychotherapeutic interventions  Participation Level:   Active  Participation Quality:  Appropriate      Behavioral Observation:  Well Groomed, Alert, and Depressed.   Current Psychosocial Factors: The patient reports that there continues to be significant stress associated with her son. He continues to be dealing with significant substance abuse. The patient reports that she is very worried about him but has been working on trying to take care of herself and working on her coping skills.   Content of Session:   Review current symptoms and continued work on building improve coping skills  Current Status:   The patient reports that depression has  Continued to be improved  over the first level but it is been. However, she reports continued significant stress. She is coping much better with her severe diabetes and carefully monitoring her blood glucose levels and other issues associated with her diabetes.   Last Reviewed:   12/24/2015  Goals Addressed Today:    Goals addressed included building better coping skills.  Impression/Diagnosis:   The patient has a long history of attention deficit disorder but do to Maj. psychosocial stressors she also developed clinical depression and anxiety. I think that her attentional problems were valid prior to the development of these issues that existed her entire life. However, she is in and repeat if and recurrent trauma with regard primarily to her son and to her daughter to a lesser degree in years past.  Diagnosis:    Axis I:  Major depressive disorder, recurrent, severe without psychotic features ([place])  Anxiety      Axis II: No diagnosis      Akeia Perot R, PsyD 03/29/2016

## 2016-03-31 ENCOUNTER — Other Ambulatory Visit: Payer: Self-pay | Admitting: Neurology

## 2016-03-31 ENCOUNTER — Other Ambulatory Visit: Payer: Self-pay | Admitting: Family Medicine

## 2016-03-31 NOTE — Telephone Encounter (Signed)
Last seen 11/25/15  Dr Evette Doffing

## 2016-03-31 NOTE — Telephone Encounter (Signed)
Last seen 11/25/15  Dr Evette Doffing  If approved route to nurse to call into [facility]   [phone]

## 2016-04-06 ENCOUNTER — Ambulatory Visit (HOSPITAL_COMMUNITY): Payer: Self-pay | Admitting: Psychology

## 2016-04-07 ENCOUNTER — Other Ambulatory Visit: Payer: Self-pay | Admitting: General Surgery

## 2016-04-07 DIAGNOSIS — N6021 Fibroadenosis of right breast: Secondary | ICD-10-CM

## 2016-04-14 ENCOUNTER — Encounter (HOSPITAL_BASED_OUTPATIENT_CLINIC_OR_DEPARTMENT_OTHER): Payer: Self-pay | Admitting: *Deleted

## 2016-04-14 ENCOUNTER — Other Ambulatory Visit: Payer: Self-pay | Admitting: *Deleted

## 2016-04-14 DIAGNOSIS — Z1239 Encounter for other screening for malignant neoplasm of breast: Secondary | ICD-10-CM

## 2016-04-20 ENCOUNTER — Other Ambulatory Visit: Payer: Self-pay

## 2016-04-20 ENCOUNTER — Encounter (HOSPITAL_BASED_OUTPATIENT_CLINIC_OR_DEPARTMENT_OTHER)
Admission: RE | Admit: 2016-04-20 | Discharge: 2016-04-20 | Disposition: A | Discharge status: Home / Self Care | Payer: BC Managed Care – PPO | Source: Ambulatory Visit | Attending: General Surgery | Admitting: General Surgery

## 2016-04-20 DIAGNOSIS — Z79899 Other long term (current) drug therapy: Secondary | ICD-10-CM | POA: Diagnosis not present

## 2016-04-20 DIAGNOSIS — N6489 Other specified disorders of breast: Secondary | ICD-10-CM | POA: Diagnosis present

## 2016-04-20 DIAGNOSIS — E119 Type 2 diabetes mellitus without complications: Secondary | ICD-10-CM | POA: Insufficient documentation

## 2016-04-20 DIAGNOSIS — Z79890 Hormone replacement therapy (postmenopausal): Secondary | ICD-10-CM | POA: Diagnosis not present

## 2016-04-20 DIAGNOSIS — E1140 Type 2 diabetes mellitus with diabetic neuropathy, unspecified: Secondary | ICD-10-CM | POA: Diagnosis not present

## 2016-04-20 DIAGNOSIS — G4730 Sleep apnea, unspecified: Secondary | ICD-10-CM | POA: Diagnosis not present

## 2016-04-20 DIAGNOSIS — Z01810 Encounter for preprocedural cardiovascular examination: Secondary | ICD-10-CM | POA: Insufficient documentation

## 2016-04-20 DIAGNOSIS — N6091 Unspecified benign mammary dysplasia of right breast: Secondary | ICD-10-CM | POA: Diagnosis not present

## 2016-04-20 DIAGNOSIS — Z803 Family history of malignant neoplasm of breast: Secondary | ICD-10-CM | POA: Diagnosis not present

## 2016-04-20 LAB — BASIC METABOLIC PANEL
Anion gap: 7 (ref 5–15)
BUN: 10 mg/dL (ref 6–20)
CO2: 27 mmol/L (ref 22–32)
Calcium: 8.9 mg/dL (ref 8.9–10.3)
Chloride: 105 mmol/L (ref 101–111)
Creatinine, Ser: 0.65 mg/dL (ref 0.44–1.00)
GFR calc Af Amer: >60 mL/min (ref >60)
GFR calc non Af Amer: >60 mL/min (ref >60)
Glucose, Bld: 113 mg/dL — ABNORMAL HIGH (ref 65–99)
Potassium: 4.6 mmol/L (ref 3.5–5.1)
Sodium: 139 mmol/L (ref 135–145)

## 2016-04-20 NOTE — Pre-Procedure Instructions (Addendum)
Pt given bottle of water and instructed to drink at 0500 day of surgery, 2 hours prior to arrival by Lehman Prom RN.

## 2016-04-22 ENCOUNTER — Ambulatory Visit
Admission: RE | Admit: 2016-04-22 | Discharge: 2016-04-22 | Disposition: A | Discharge status: Home / Self Care | Payer: BC Managed Care – PPO | Source: Ambulatory Visit | Attending: General Surgery | Admitting: General Surgery

## 2016-04-22 DIAGNOSIS — N6021 Fibroadenosis of right breast: Secondary | ICD-10-CM

## 2016-04-25 ENCOUNTER — Ambulatory Visit (HOSPITAL_BASED_OUTPATIENT_CLINIC_OR_DEPARTMENT_OTHER)
Admission: RE | Admit: 2016-04-25 | Discharge: 2016-04-25 | Disposition: A | Discharge status: Home / Self Care | Payer: BC Managed Care – PPO | Source: Ambulatory Visit | Attending: General Surgery | Admitting: General Surgery

## 2016-04-25 ENCOUNTER — Ambulatory Visit (HOSPITAL_BASED_OUTPATIENT_CLINIC_OR_DEPARTMENT_OTHER): Payer: BC Managed Care – PPO | Admitting: Anesthesiology

## 2016-04-25 ENCOUNTER — Encounter (HOSPITAL_BASED_OUTPATIENT_CLINIC_OR_DEPARTMENT_OTHER): Payer: Self-pay | Admitting: Anesthesiology

## 2016-04-25 ENCOUNTER — Encounter (HOSPITAL_BASED_OUTPATIENT_CLINIC_OR_DEPARTMENT_OTHER)
Admission: RE | Disposition: A | Discharge status: Home / Self Care | Payer: Self-pay | Source: Ambulatory Visit | Attending: General Surgery

## 2016-04-25 ENCOUNTER — Ambulatory Visit
Admission: RE | Admit: 2016-04-25 | Discharge: 2016-04-25 | Disposition: A | Discharge status: Home / Self Care | Payer: BC Managed Care – PPO | Source: Ambulatory Visit | Attending: General Surgery | Admitting: General Surgery

## 2016-04-25 DIAGNOSIS — E1140 Type 2 diabetes mellitus with diabetic neuropathy, unspecified: Secondary | ICD-10-CM | POA: Insufficient documentation

## 2016-04-25 DIAGNOSIS — N6489 Other specified disorders of breast: Secondary | ICD-10-CM | POA: Diagnosis not present

## 2016-04-25 DIAGNOSIS — Z79899 Other long term (current) drug therapy: Secondary | ICD-10-CM | POA: Insufficient documentation

## 2016-04-25 DIAGNOSIS — N6091 Unspecified benign mammary dysplasia of right breast: Secondary | ICD-10-CM | POA: Insufficient documentation

## 2016-04-25 DIAGNOSIS — Z79890 Hormone replacement therapy (postmenopausal): Secondary | ICD-10-CM | POA: Insufficient documentation

## 2016-04-25 DIAGNOSIS — Z803 Family history of malignant neoplasm of breast: Secondary | ICD-10-CM | POA: Insufficient documentation

## 2016-04-25 DIAGNOSIS — N6021 Fibroadenosis of right breast: Secondary | ICD-10-CM

## 2016-04-25 DIAGNOSIS — G4730 Sleep apnea, unspecified: Secondary | ICD-10-CM | POA: Insufficient documentation

## 2016-04-25 HISTORY — PX: BREAST LUMPECTOMY WITH RADIOACTIVE SEED LOCALIZATION: SHX6424

## 2016-04-25 LAB — GLUCOSE, CAPILLARY
Glucose-Capillary: 81 mg/dL (ref 65–99)
Glucose-Capillary: 65 mg/dL (ref 65–99)
Glucose-Capillary: 120 mg/dL — ABNORMAL HIGH (ref 65–99)

## 2016-04-25 SURGERY — BREAST LUMPECTOMY WITH RADIOACTIVE SEED LOCALIZATION
Anesthesia: General | Site: Breast | Laterality: Right

## 2016-04-25 MED ORDER — GLYCOPYRROLATE 0.2 MG/ML IJ SOLN: 0.2000 mg | Freq: Once | INTRAMUSCULAR | Status: DC | PRN

## 2016-04-25 MED ORDER — CEFAZOLIN SODIUM-DEXTROSE 2-4 GM/100ML-% IV SOLN
INTRAVENOUS | Status: AC
  Filled 2016-04-25: qty 100

## 2016-04-25 MED ORDER — LIDOCAINE 2% (20 MG/ML) 5 ML SYRINGE
INTRAMUSCULAR | Status: DC | PRN
  Administered 2016-04-25: 60 mg via INTRAVENOUS

## 2016-04-25 MED ORDER — LACTATED RINGERS IV SOLN: INTRAVENOUS | Status: DC

## 2016-04-25 MED ORDER — FENTANYL CITRATE (PF) 100 MCG/2ML IJ SOLN
25.0000 ug | INTRAMUSCULAR | Status: DC | PRN
  Administered 2016-04-25 (×2): 50 ug via INTRAVENOUS

## 2016-04-25 MED ORDER — SODIUM CHLORIDE 0.9 % IJ SOLN
INTRAMUSCULAR | Status: AC
  Filled 2016-04-25: qty 10

## 2016-04-25 MED ORDER — LACTATED RINGERS IV SOLN
INTRAVENOUS | Status: DC
  Administered 2016-04-25 (×2): via INTRAVENOUS

## 2016-04-25 MED ORDER — OXYCODONE HCL 5 MG PO TABS
5.0000 mg | ORAL_TABLET | Freq: Once | ORAL | Status: AC
  Administered 2016-04-25: 5 mg via ORAL

## 2016-04-25 MED ORDER — DEXAMETHASONE SODIUM PHOSPHATE 4 MG/ML IJ SOLN
INTRAMUSCULAR | Status: DC | PRN
  Administered 2016-04-25: 10 mg via INTRAVENOUS

## 2016-04-25 MED ORDER — FENTANYL CITRATE (PF) 100 MCG/2ML IJ SOLN
50.0000 ug | INTRAMUSCULAR | Status: AC | PRN
  Administered 2016-04-25 (×2): 25 ug via INTRAVENOUS
  Administered 2016-04-25: 100 ug via INTRAVENOUS

## 2016-04-25 MED ORDER — CIPROFLOXACIN-DEXAMETHASONE 0.3-0.1 % OT SUSP
OTIC | Status: AC
  Filled 2016-04-25: qty 7.5

## 2016-04-25 MED ORDER — METOCLOPRAMIDE HCL 5 MG/ML IJ SOLN: 10.0000 mg | Freq: Once | INTRAMUSCULAR | Status: DC | PRN

## 2016-04-25 MED ORDER — BUPIVACAINE-EPINEPHRINE (PF) 0.25% -1:200000 IJ SOLN
INTRAMUSCULAR | Status: DC | PRN
  Administered 2016-04-25: 20 mL

## 2016-04-25 MED ORDER — DEXAMETHASONE SODIUM PHOSPHATE 10 MG/ML IJ SOLN
INTRAMUSCULAR | Status: AC
  Filled 2016-04-25: qty 1

## 2016-04-25 MED ORDER — BUPIVACAINE-EPINEPHRINE (PF) 0.5% -1:200000 IJ SOLN
INTRAMUSCULAR | Status: AC
  Filled 2016-04-25: qty 60

## 2016-04-25 MED ORDER — ONDANSETRON HCL 4 MG/2ML IJ SOLN
INTRAMUSCULAR | Status: DC | PRN
  Administered 2016-04-25: 4 mg via INTRAVENOUS

## 2016-04-25 MED ORDER — BUPIVACAINE-EPINEPHRINE (PF) 0.25% -1:200000 IJ SOLN
INTRAMUSCULAR | Status: AC
  Filled 2016-04-25: qty 30

## 2016-04-25 MED ORDER — BUPIVACAINE HCL (PF) 0.5 % IJ SOLN
INTRAMUSCULAR | Status: AC
  Filled 2016-04-25: qty 30

## 2016-04-25 MED ORDER — LIDOCAINE 2% (20 MG/ML) 5 ML SYRINGE
INTRAMUSCULAR | Status: AC
  Filled 2016-04-25: qty 5

## 2016-04-25 MED ORDER — FENTANYL CITRATE (PF) 100 MCG/2ML IJ SOLN
INTRAMUSCULAR | Status: AC
  Filled 2016-04-25: qty 2

## 2016-04-25 MED ORDER — PROPOFOL 500 MG/50ML IV EMUL
INTRAVENOUS | Status: AC
  Filled 2016-04-25: qty 50

## 2016-04-25 MED ORDER — METHYLENE BLUE 0.5 % INJ SOLN
INTRAVENOUS | Status: AC
  Filled 2016-04-25: qty 10

## 2016-04-25 MED ORDER — BUPIVACAINE HCL (PF) 0.25 % IJ SOLN
INTRAMUSCULAR | Status: AC
  Filled 2016-04-25: qty 30

## 2016-04-25 MED ORDER — MIDAZOLAM HCL 2 MG/2ML IJ SOLN
INTRAMUSCULAR | Status: AC
  Filled 2016-04-25: qty 2

## 2016-04-25 MED ORDER — CEFAZOLIN SODIUM-DEXTROSE 2-4 GM/100ML-% IV SOLN
2.0000 g | INTRAVENOUS | Status: AC
  Administered 2016-04-25: 2 g via INTRAVENOUS

## 2016-04-25 MED ORDER — SCOPOLAMINE 1 MG/3DAYS TD PT72: 1.0000 | MEDICATED_PATCH | Freq: Once | TRANSDERMAL | Status: DC | PRN

## 2016-04-25 MED ORDER — PROPOFOL 10 MG/ML IV BOLUS
INTRAVENOUS | Status: DC | PRN
  Administered 2016-04-25: 150 mg via INTRAVENOUS

## 2016-04-25 MED ORDER — CHLORHEXIDINE GLUCONATE 4 % EX LIQD: 1.0000 "application " | Freq: Once | CUTANEOUS | Status: DC

## 2016-04-25 MED ORDER — OXYCODONE HCL 5 MG PO TABS
ORAL_TABLET | ORAL | Status: AC
  Filled 2016-04-25: qty 1

## 2016-04-25 MED ORDER — MEPERIDINE HCL 25 MG/ML IJ SOLN: 6.2500 mg | INTRAMUSCULAR | Status: DC | PRN

## 2016-04-25 MED ORDER — MIDAZOLAM HCL 2 MG/2ML IJ SOLN
1.0000 mg | INTRAMUSCULAR | Status: DC | PRN
  Administered 2016-04-25: 2 mg via INTRAVENOUS

## 2016-04-25 MED ORDER — ONDANSETRON HCL 4 MG/2ML IJ SOLN
INTRAMUSCULAR | Status: AC
  Filled 2016-04-25: qty 2

## 2016-04-25 MED ORDER — OXYCODONE-ACETAMINOPHEN 5-325 MG PO TABS: 1.0000 | ORAL_TABLET | ORAL | Status: DC | PRN

## 2016-04-25 SURGICAL SUPPLY — 42 items
APPLIER CLIP 9.375 MED OPEN (MISCELLANEOUS)
APR CLP MED 9.3 20 MLT OPN (MISCELLANEOUS)
BLADE SURG 15 STRL LF DISP TIS (BLADE) ×1 IMPLANT
BLADE SURG 15 STRL SS (BLADE) ×2
CANISTER SUC SOCK COL 7IN (MISCELLANEOUS) IMPLANT
CANISTER SUCT 1200ML W/VALVE (MISCELLANEOUS) ×3 IMPLANT
CHLORAPREP W/TINT 26ML (MISCELLANEOUS) ×3 IMPLANT
CLIP APPLIE 9.375 MED OPEN (MISCELLANEOUS) IMPLANT
COVER BACK TABLE 60X90IN (DRAPES) ×3 IMPLANT
COVER MAYO STAND STRL (DRAPES) ×3 IMPLANT
COVER PROBE W GEL 5X96 (DRAPES) ×3 IMPLANT
DECANTER SPIKE VIAL GLASS SM (MISCELLANEOUS) IMPLANT
DEVICE DUBIN W/COMP PLATE 8390 (MISCELLANEOUS) ×3 IMPLANT
DRAPE LAPAROSCOPIC ABDOMINAL (DRAPES) ×3 IMPLANT
DRAPE UTILITY XL STRL (DRAPES) ×3 IMPLANT
ELECT COATED BLADE 2.86 ST (ELECTRODE) ×3 IMPLANT
ELECT REM PT RETURN 9FT ADLT (ELECTROSURGICAL) ×3
ELECTRODE REM PT RTRN 9FT ADLT (ELECTROSURGICAL) ×1 IMPLANT
GLOVE BIO SURGEON STRL SZ7.5 (GLOVE) ×6 IMPLANT
GLOVE BIOGEL PI IND STRL 7.0 (GLOVE) ×2 IMPLANT
GLOVE BIOGEL PI INDICATOR 7.0 (GLOVE) ×4
GLOVE ECLIPSE 6.5 STRL STRAW (GLOVE) ×3 IMPLANT
GOWN STRL REUS W/ TWL LRG LVL3 (GOWN DISPOSABLE) ×2 IMPLANT
GOWN STRL REUS W/TWL LRG LVL3 (GOWN DISPOSABLE) ×6
KIT MARKER MARGIN INK (KITS) ×3 IMPLANT
LIGHT WAVEGUIDE WIDE FLAT (MISCELLANEOUS) ×3 IMPLANT
LIQUID BAND (GAUZE/BANDAGES/DRESSINGS) ×3 IMPLANT
NEEDLE HYPO 25X1 1.5 SAFETY (NEEDLE) ×3 IMPLANT
NS IRRIG 1000ML POUR BTL (IV SOLUTION) ×3 IMPLANT
PACK BASIN DAY SURGERY FS (CUSTOM PROCEDURE TRAY) ×3 IMPLANT
PENCIL BUTTON HOLSTER BLD 10FT (ELECTRODE) ×3 IMPLANT
SLEEVE SCD COMPRESS KNEE MED (MISCELLANEOUS) ×3 IMPLANT
SPONGE LAP 18X18 X RAY DECT (DISPOSABLE) ×3 IMPLANT
SUT MON AB 4-0 PC3 18 (SUTURE) ×3 IMPLANT
SUT SILK 2 0 SH (SUTURE) IMPLANT
SUT VICRYL 3-0 CR8 SH (SUTURE) ×3 IMPLANT
SYR CONTROL 10ML LL (SYRINGE) ×3 IMPLANT
TOWEL OR 17X24 6PK STRL BLUE (TOWEL DISPOSABLE) ×3 IMPLANT
TOWEL OR NON WOVEN STRL DISP B (DISPOSABLE) ×3 IMPLANT
TUBE CONNECTING 20'X1/4 (TUBING) ×1
TUBE CONNECTING 20X1/4 (TUBING) ×2 IMPLANT
YANKAUER SUCT BULB TIP NO VENT (SUCTIONS) ×3 IMPLANT

## 2016-04-25 NOTE — Op Note (Signed)
04/25/2016  9:32 AM  [name]  60 y.o. female  PRE-OPERATIVE DIAGNOSIS:  Right breast complex sclerosing lesion   POST-OPERATIVE DIAGNOSIS:  Right breast complex sclerosing lesion   PROCEDURE:  Procedure(s): RIGHT BREAST LUMPECTOMY WITH RADIOACTIVE SEED LOCALIZATION (Right)  SURGEON:  Surgeon(s) and Role:    * Jovita Kussmaul, MD - Primary  PHYSICIAN ASSISTANT:   ASSISTANTS: none   ANESTHESIA:   general  EBL:  Total I/O In: 1000 [I.V.:1000] Out: 10 [Blood:10]  BLOOD ADMINISTERED:none  DRAINS: none   LOCAL MEDICATIONS USED:  MARCAINE     SPECIMEN:  Source of Specimen:  right breast tissue  DISPOSITION OF SPECIMEN:  PATHOLOGY  COUNTS:  YES  TOURNIQUET:  * No tourniquets in log *  DICTATION: .Dragon Dictation   After informed consent was obtained patient was brought to the operating room and placed in the supine position on the operating room table. After adequate induction of general anesthesia the patient's right breast was prepped with ChloraPrep, allowed to dry, and draped in usual sterile manner. Previously  An I-125 seed was placed in the outer aspect of the right breast to mark an area of a complex sclerosing lesion. The neoprobe was set to I-125 in the area of radioactivity was readily identified in the lower outer right breast. I decided to make a right lateral inframammary incision  With a 15 blade knife. The area was infiltrated with quarter percent Marcaine with epinephrine. The incision was carried through the skin and subcutaneous tissue sharply with the electrocautery.  The Invuity lighted retractors were used and the dissection was carried towards the area of radioactivity. The area of radioactivity was then grasped with an Allis clamp and a circular portion of breast tissue was excised sharply around the radioactive seed with the electrocautery. The area of radioactivity was checked frequently during this process with the neoprobe. Once the specimen  was removed it was oriented with the appropriate paint colors.A specimen radiograph was obtained that showed the clip and seed to be in the center of the specimen. The specimen was then sent to pathology for further evaluation. Hemostasis was achieved using the Bovie electrocautery. The wound was infiltrated with quarter percent Marcaine and irrigated with saline. The deep layer of the wound was then closed with layers of interrupted 3-0 Vicryl stitches. The skin was then closed with interrupted 4-0 Monocryl subcuticular stitches. Dermabond dressings were applied. The patient tolerated the procedure well. At the end of th case all needle sponge and instrument counts were correct. The patient was then awakened and taken to recovery in stable condition.  PLAN OF CARE: Discharge to home after PACU  PATIENT DISPOSITION:  PACU - hemodynamically stable.   Delay start of Pharmacological VTE agent (>24hrs) due to surgical blood loss or risk of bleeding: not applicable

## 2016-04-25 NOTE — Transfer of Care (Signed)
Immediate Anesthesia Transfer of Care Note  Patient: Rhonda Thomas  Procedure(s) Performed: Procedure(s): RIGHT BREAST LUMPECTOMY WITH RADIOACTIVE SEED LOCALIZATION (Right)  Patient Location: PACU  Anesthesia Type:General  Level of Consciousness: sedated  Airway & Oxygen Therapy: Patient Spontanous Breathing and Patient connected to face mask oxygen  Post-op Assessment: Report given to RN and Post -op Vital signs reviewed and stable  Post vital signs: Reviewed and stable  Last Vitals:  Filed Vitals:   04/25/16 0729 04/25/16 0938  BP: 140/77   Pulse: 72 77  Temp: 36.6 C   Resp: 20 15    Last Pain: There were no vitals filed for this visit.       Complications: No apparent anesthesia complications

## 2016-04-25 NOTE — H&P (Signed)
Rhonda Thomas  Location: [facility] Surgery Patient #: [id number] DOB: [birth year] Married / Language: English / Race: White Female   History of Present Illness  Patient words: right br csl.  The patient is a 60 year old female who presents with a breast mass. We are asked to see the patient in consultation by Dr. Hassan Rowan to evaluate her for a right breast complex sclerosing lesion. The patient is a 60 year old white female who recently went for a routine screening mammogram at [facility]. At that time she was found to have an abnormality in the lateral aspect of the right breast. [name] was biopsied and came back as a complex sclerosing lesion. Her only family history of breast cancer is in her paternal grandmother. Her only complaint is of pain in her feet from neuropathy secondary to her diabetes. She is managed by a pain clinic for this.   Other Problems  Anxiety Disorder Diabetes Mellitus Inguinal Hernia  Past Surgical History Breast Biopsy Right. Foot Surgery Right. Gallbladder Surgery - Laparoscopic Hysterectomy (not due to cancer) - Partial  Diagnostic Studies History  Mammogram within last year Pap Smear 1-5 years ago  Allergies  No Known Drug Allergies04/06/2016  Medication History  Zolpidem Tartrate (10MG  Tablet, Oral) Active. Lyrica (150MG  Capsule, Oral) Active. Carbidopa-Levodopa (25-100MG  Tablet, Oral) Active. Lisinopril (10MG  Tablet, Oral) Active. Omeprazole (40MG  Capsule DR, Oral) Active. OXcarbazepine (150MG  Tablet, Oral) Active. Sertraline HCl (50MG  Tablet, Oral) Active. Trulicity (1.5MG /0.5ML Soln Pen-inj, Subcutaneous) Active. Tylenol (500MG  Capsule, Oral) Active. Fluconazole (150MG  Tablet, Oral) Active. Oxycodone-Acetaminophen (10-325MG  Tablet, Oral) Active. Krill Oil Omega-3 (300MG  Capsule, Oral) Active. MetFORMIN HCl (1000MG  Tablet, Oral) Active. MetFORMIN HCl (500MG  Tablet, Oral) Active. NovoLOG (100UNIT/ML  Solution, Subcutaneous) Active. Premarin (1.25MG  Tablet, Oral) Active. Medications Reconciled  Social History Caffeine use Tea. No alcohol use No drug use Tobacco use Never smoker.  Family History  Cerebrovascular Accident Mother. Diabetes Mellitus Father, Mother. Heart Disease Father, Mother.  Pregnancy / Birth History  Age at menarche 67 years. Age of menopause <45 Contraceptive History Oral contraceptives. Gravida 4 Irregular periods Maternal age 66-30 Para 3    Review of Systems  General Not Present- Appetite Loss, Chills, Fatigue, Fever, Night Sweats, Weight Gain and Weight Loss. Skin Not Present- Change in Wart/Mole, Dryness, Hives, Jaundice, New Lesions, Non-Healing Wounds, Rash and Ulcer. HEENT Present- Ringing in the Ears and Wears glasses/contact lenses. Not Present- Earache, Hearing Loss, Hoarseness, Nose Bleed, Oral Ulcers, Seasonal Allergies, Sinus Pain, Sore Throat, Visual Disturbances and Yellow Eyes. Respiratory Not Present- Bloody sputum, Chronic Cough, Difficulty Breathing, Snoring and Wheezing. Breast Not Present- Breast Mass, Breast Pain, Nipple Discharge and Skin Changes. Cardiovascular Not Present- Chest Pain, Difficulty Breathing Lying Down, Leg Cramps, Palpitations, Rapid Heart Rate, Shortness of Breath and Swelling of Extremities. Gastrointestinal Present- Difficulty Swallowing and Gets full quickly at meals. Not Present- Abdominal Pain, Bloating, Bloody Stool, Change in Bowel Habits, Chronic diarrhea, Constipation, Excessive gas, Hemorrhoids, Indigestion, Nausea, Rectal Pain and Vomiting. Female Genitourinary Not Present- Frequency, Nocturia, Painful Urination, Pelvic Pain and Urgency. Musculoskeletal Not Present- Back Pain, Joint Pain, Joint Stiffness, Muscle Pain, Muscle Weakness and Swelling of Extremities. Neurological Present- Numbness. Not Present- Decreased Memory, Fainting, Headaches, Seizures, Tingling, Tremor, Trouble walking and  Weakness. Psychiatric Present- Anxiety. Not Present- Bipolar, Change in Sleep Pattern, Depression, Fearful and Frequent crying. Endocrine Not Present- Cold Intolerance, Excessive Hunger, Hair Changes, Heat Intolerance, Hot flashes and New Diabetes. Hematology Not Present- Easy Bruising, Excessive bleeding, Gland problems, HIV and Persistent Infections.  Vitals  Weight: 194.25 lb Height: 70in Body Surface Area: 2.06 m Body Mass Index: 27.87 kg/m  BP: 122/80 (Sitting, Left Arm, Standard)       Physical Exam  General Mental Status-Alert. General Appearance-Consistent with stated age. Hydration-Well hydrated. Voice-Normal.  Head and Neck Head-normocephalic, atraumatic with no lesions or palpable masses. Trachea-midline. Thyroid Gland Characteristics - normal size and consistency.  Eye Eyeball - Bilateral-Extraocular movements intact. Sclera/Conjunctiva - Bilateral-No scleral icterus.  Chest and Lung Exam Chest and lung exam reveals -quiet, even and easy respiratory effort with no use of accessory muscles and on auscultation, normal breath sounds, no adventitious sounds and normal vocal resonance. Inspection Chest Wall - Normal. Back - normal.  Breast Note: There is no palpable mass in either breast. There is no palpable axillary, supraclavicular, or cervical lymphadenopathy.   Cardiovascular Cardiovascular examination reveals -normal heart sounds, regular rate and rhythm with no murmurs and normal pedal pulses bilaterally.  Abdomen Inspection Inspection of the abdomen reveals - No Hernias. Skin - Scar - no surgical scars. Palpation/Percussion Palpation and Percussion of the abdomen reveal - Soft, Non Tender, No Rebound tenderness, No Rigidity (guarding) and No hepatosplenomegaly. Auscultation Auscultation of the abdomen reveals - Bowel sounds normal.  Neurologic Neurologic evaluation reveals -alert and oriented x 3 with no impairment of  recent or remote memory. Mental Status-Normal.  Musculoskeletal Normal Exam - Left-Upper Extremity Strength Normal and Lower Extremity Strength Normal. Normal Exam - Right-Upper Extremity Strength Normal and Lower Extremity Strength Normal.  Lymphatic Head & Neck  General Head & Neck Lymphatics: Bilateral - Description - Normal. Axillary  General Axillary Region: Bilateral - Description - Normal. Tenderness - Non Tender. Femoral & Inguinal  Generalized Femoral & Inguinal Lymphatics: Bilateral - Description - Normal. Tenderness - Non Tender.    Assessment & Plan  SCLEROSING ADENOSIS OF BREAST, RIGHT (N60.21) Impression: The patient appears to have a complex sclerosing lesion in the lateral right breast. Because of its abnormal appearance on mammogram and because it is considered a high risk lesion I would recommend that the area be removed. I have discussed with her in detail the risks and benefits of the operation to remove the lesion as well as some of the technical aspects and she understands and wishes to proceed. I will plan for a right breast radioactive seed localized lumpectomy. She is also part of a pain clinic so they will manage her postoperative pain medicines. Current Plans Pt Education - Breast Diseases: discussed with patient and provided information.   Signed by Luella Cook, MD

## 2016-04-25 NOTE — Interval H&P Note (Signed)
History and Physical Interval Note:  04/25/2016 8:26 AM  Rhonda Thomas  has presented today for surgery, with the diagnosis of Right breast complex sclerosing lesion   The various methods of treatment have been discussed with the patient and family. After consideration of risks, benefits and other options for treatment, the patient has consented to  Procedure(s): RIGHT BREAST LUMPECTOMY WITH RADIOACTIVE SEED LOCALIZATION (Right) as a surgical intervention .  The patient's history has been reviewed, patient examined, no change in status, stable for surgery.  I have reviewed the patient's chart and labs.  Questions were answered to the patient's satisfaction.     TOTH III,Tanisha Lutes S

## 2016-04-25 NOTE — Anesthesia Postprocedure Evaluation (Signed)
Anesthesia Post Note  Patient: Rhonda Thomas  Procedure(s) Performed: Procedure(s) (LRB): RIGHT BREAST LUMPECTOMY WITH RADIOACTIVE SEED LOCALIZATION (Right)  Patient location during evaluation: PACU Anesthesia Type: General Level of consciousness: awake and alert and patient cooperative Pain management: pain level controlled Vital Signs Assessment: post-procedure vital signs reviewed and stable Respiratory status: spontaneous breathing and respiratory function stable Cardiovascular status: stable Anesthetic complications: no    Last Vitals:  Filed Vitals:   04/25/16 1030 04/25/16 1045  BP: 133/75 121/66  Pulse: 80 83  Temp:    Resp: 13 17    Last Pain:  Filed Vitals:   04/25/16 1107  PainSc: [place]

## 2016-04-25 NOTE — Anesthesia Preprocedure Evaluation (Addendum)
Anesthesia Evaluation  Patient identified by MRN, date of birth, ID band Patient awake    Reviewed: Allergy & Precautions, NPO status , Patient's Chart, lab work & pertinent test results  Airway Mallampati: II  TM Distance: >3 FB Neck ROM: Full    Dental no notable dental hx.    Pulmonary neg pulmonary ROS, sleep apnea and Continuous Positive Airway Pressure Ventilation ,    Pulmonary exam normal breath sounds clear to auscultation       Cardiovascular negative cardio ROS Normal cardiovascular exam Rhythm:Regular Rate:Normal     Neuro/Psych negative neurological ROS  negative psych ROS   GI/Hepatic Neg liver ROS, hiatal hernia,   Endo/Other  diabetes, Type 2, Insulin Dependent, Oral Hypoglycemic Agents  Renal/GU negative Renal ROS  negative genitourinary   Musculoskeletal negative musculoskeletal ROS (+)   Abdominal   Peds negative pediatric ROS (+)  Hematology negative hematology ROS (+)   Anesthesia Other Findings   Reproductive/Obstetrics negative OB ROS                           Anesthesia Physical Anesthesia Plan  ASA: II  Anesthesia Plan: General   Post-op Pain Management:    Induction: Intravenous  Airway Management Planned: LMA  Additional Equipment:   Intra-op Plan:   Post-operative Plan: Extubation in OR  Informed Consent: I have reviewed the patients History and Physical, chart, labs and discussed the procedure including the risks, benefits and alternatives for the proposed anesthesia with the patient or authorized representative who has indicated his/her understanding and acceptance.   Dental advisory given  Plan Discussed with: CRNA  Anesthesia Plan Comments:         Anesthesia Quick Evaluation

## 2016-04-25 NOTE — Discharge Instructions (Signed)

## 2016-04-25 NOTE — Anesthesia Procedure Notes (Signed)
Procedure Name: LMA Insertion Date/Time: 04/25/2016 8:42 AM Performed by: Maryella Shivers Pre-anesthesia Checklist: Patient identified, Emergency Drugs available, Suction available and Patient being monitored Patient Re-evaluated:Patient Re-evaluated prior to inductionOxygen Delivery Method: Circle System Utilized Preoxygenation: Pre-oxygenation with 100% oxygen Intubation Type: IV induction Ventilation: Mask ventilation without difficulty LMA: LMA inserted LMA Size: 4.0 Number of attempts: 1 Airway Equipment and Method: Bite block Placement Confirmation: positive ETCO2 Tube secured with: Tape Dental Injury: Teeth and Oropharynx as per pre-operative assessment

## 2016-04-26 ENCOUNTER — Encounter (HOSPITAL_BASED_OUTPATIENT_CLINIC_OR_DEPARTMENT_OTHER): Payer: Self-pay | Admitting: General Surgery

## 2016-05-02 ENCOUNTER — Other Ambulatory Visit: Payer: Self-pay | Admitting: Nurse Practitioner

## 2016-05-02 ENCOUNTER — Other Ambulatory Visit: Payer: Self-pay | Admitting: Neurology

## 2016-05-02 ENCOUNTER — Other Ambulatory Visit: Payer: Self-pay | Admitting: Family Medicine

## 2016-05-02 NOTE — Telephone Encounter (Signed)
Last seen 11/25/15  Dr Evette Doffing  If approved route to nurse to call into [facility]   [phone]

## 2016-05-18 ENCOUNTER — Encounter (HOSPITAL_COMMUNITY): Payer: Self-pay | Admitting: Psychology

## 2016-05-18 NOTE — Progress Notes (Signed)
Patient ID: Rhonda Thomas, female   DOB: 11/26/1956, 60 y.o.   MRN: [id number] [name]   DOB: 23-Aug-1956  MR Number: [id number]  Location: [facility] [address] [place] [address] Dept: [phone]  Start:  11 AM End:  12 PM  Provider/Observer:     Edgardo Roys PSYD  Chief Complaint:      Chief Complaint  Patient presents with  . Depression  . Anxiety  . Stress    Reason For Service:     I seen the patient is also known to 4 several years. She was initially referred here for difficulty coping with the situation involving her children. Her youngest son is at severe difficulties over the years with regard to mood disorder, substance abuse and behavioral problems. Now there are major stressors associated with him again the patient has been essentially overwhelmed and unsure about what to do about. She reports that this has created a lot of stress both at home and at work. On top of that, her diabetes has gotten to the point that she had hammertoe amputated because of that. She has neuropathy as a result of diabetes as well and has had to stay out of her foot for 8 weeks.  Interventions Strategy:  Cognitive/behavioral psychotherapeutic interventions  Participation Level:   Active  Participation Quality:  Appropriate      Behavioral Observation:  Well Groomed, Alert, and Depressed.   Current Psychosocial Factors: The patient reports that  She did little bit better over the holidays. While her son was still in and out of the house at various times and others going back to his girlfriend that she is handling the stress associated with them better. She reports that he is continuing to abuse  Narcotic medications and knows that he is doing it intravenously at times. She reports that this gives her a great fear and apprehension about him dying but realizes that there is nothing that she  can do and has to take care of herself first.  Content of Session:   Review current symptoms and continued work on building improve coping skills  Current Status:   The patient reports that depression has  Continued to be improved over the first level but it is been. However, she reports continued significant stress. She is coping much better with her severe diabetes and carefully monitoring her blood glucose levels and other issues associated with her diabetes.   Last Reviewed:   01/20/2016  Goals Addressed Today:    Goals addressed included building better coping skills.  Impression/Diagnosis:   The patient has a long history of attention deficit disorder but do to Maj. psychosocial stressors she also developed clinical depression and anxiety. I think that her attentional problems were valid prior to the development of these issues that existed her entire life. However, she is in and repeat if and recurrent trauma with regard primarily to her son and to her daughter to a lesser degree in years past.  Diagnosis:    Axis I:  Major depressive disorder, recurrent, severe without psychotic features ([place])  Anxiety      Axis II: No diagnosis      RODENBOUGH,JOHN R, PsyD 05/18/2016

## 2016-05-18 NOTE — Progress Notes (Signed)
Patient ID: [name], female   DOB: [birth year], 60 y.o.   MRN: [id number] [name]   DOB: 07-17-1956  MR Number: [id number]  Location: [facility] [address] [place] [address] Dept: [phone]  Start:  11 AM End:  12 PM  Provider/Observer:     Edgardo Roys PSYD  Chief Complaint:      Chief Complaint  Patient presents with  . Depression  . Anxiety  . Stress    Reason For Service:     I seen the patient is also known to 4 several years. She was initially referred here for difficulty coping with the situation involving her children. Her youngest son is at severe difficulties over the years with regard to mood disorder, substance abuse and behavioral problems. Now there are major stressors associated with him again the patient has been essentially overwhelmed and unsure about what to do about. She reports that this has created a lot of stress both at home and at work. On top of that, her diabetes has gotten to the point that she had hammertoe amputated because of that. She has neuropathy as a result of diabetes as well and has had to stay out of her foot for 8 weeks.  Interventions Strategy:  Cognitive/behavioral psychotherapeutic interventions  Participation Level:   Active  Participation Quality:  Appropriate      Behavioral Observation:  Well Groomed, Alert, and Depressed.   Current Psychosocial Factors: The patient reports that  She did little bit better over the holidays. While her son was still in and out of the house at various times and others going back to his girlfriend that she is handling the stress associated with them better. She reports that he is continuing to abuse  Narcotic medications and knows that he is doing it intravenously at times. She reports that this gives her a great fear and apprehension about him dying but realizes that there is nothing that she  can do and has to take care of herself first.  Content of Session:   Review current symptoms and continued work on building improve coping skills  Current Status:   The patient reports that depression has  Continued to be improved over the first level but it is been. However, she reports continued significant stress. She is coping much better with her severe diabetes and carefully monitoring her blood glucose levels and other issues associated with her diabetes.   Last Reviewed:   02/14/2016  Goals Addressed Today:    Goals addressed included building better coping skills.  Impression/Diagnosis:   The patient has a long history of attention deficit disorder but do to Maj. psychosocial stressors she also developed clinical depression and anxiety. I think that her attentional problems were valid prior to the development of these issues that existed her entire life. However, she is in and repeat if and recurrent trauma with regard primarily to her son and to her daughter to a lesser degree in years past.  Diagnosis:    Axis I:  Major depressive disorder, recurrent, severe without psychotic features ([place])  Anxiety      Axis II: No diagnosis      RODENBOUGH,JOHN R, PsyD 05/18/2016

## 2016-06-01 ENCOUNTER — Other Ambulatory Visit: Payer: Self-pay | Admitting: Pediatrics

## 2016-06-10 NOTE — Telephone Encounter (Signed)
LMVM for pt to call re: refill and need for appt prior to fill. (08/2015 request)?

## 2016-07-02 ENCOUNTER — Other Ambulatory Visit: Payer: Self-pay | Admitting: Pediatrics

## 2016-07-05 ENCOUNTER — Ambulatory Visit (INDEPENDENT_AMBULATORY_CARE_PROVIDER_SITE_OTHER): Payer: BC Managed Care – PPO | Admitting: Nurse Practitioner

## 2016-07-05 ENCOUNTER — Encounter: Payer: Self-pay | Admitting: Nurse Practitioner

## 2016-07-05 VITALS — BP 142/88 | HR 74 | Ht 69.0 in | Wt 193.0 lb

## 2016-07-05 DIAGNOSIS — H9312 Tinnitus, left ear: Secondary | ICD-10-CM

## 2016-07-05 DIAGNOSIS — R5383 Other fatigue: Secondary | ICD-10-CM | POA: Diagnosis not present

## 2016-07-05 NOTE — Addendum Note (Signed)
Addended by: Chevis Pretty on: 07/05/2016 03:37 PM   Modules accepted: Orders

## 2016-07-05 NOTE — Patient Instructions (Signed)
Tinnitus  Tinnitus refers to hearing a sound when there is no actual source for that sound. This is often described as ringing in the ears. However, people with this condition may hear a variety of noises. A person may hear the sound in one ear or in both ears.   The sounds of tinnitus can be soft, loud, or somewhere in between. Tinnitus can last for a few seconds or can be constant for days. It may go away without treatment and come back at various times. When tinnitus is constant or happens often, it can lead to other problems, such as trouble sleeping and trouble concentrating.  Almost everyone experiences tinnitus at some point. Tinnitus that is long-lasting (chronic) or comes back often is a problem that may require medical attention.   CAUSES   The cause of tinnitus is often not known. In some cases, it can result from other problems or conditions, including:   · Exposure to loud noises from machinery, music, or other sources.  · Hearing loss.  · Ear or sinus infections.  · Earwax buildup.  · A foreign object in the ear.  · Use of certain medicines.  · Use of alcohol and caffeine.  · High blood pressure.  · Heart diseases.  · Anemia.  · Allergies.  · Meniere disease.  · Thyroid problems.  · Tumors.  · An enlarged part of a weakened blood vessel (aneurysm).  SYMPTOMS  The main symptom of tinnitus is hearing a sound when there is no source for that sound. It may sound like:   · Buzzing.  · Roaring.  · Ringing.  · Blowing air, similar to the sound heard when you listen to a seashell.  · Hissing.  · Whistling.  · Sizzling.  · Humming.  · Running water.  · A sustained musical note.  DIAGNOSIS   Tinnitus is diagnosed based on your symptoms. Your health care provider will do a physical exam. A comprehensive hearing exam (audiologic exam) will be done if your tinnitus:   · Affects only one ear (unilateral).  · Causes hearing difficulties.  · Lasts 6 months or longer.  You may also need to see a health care provider  who specializes in hearing disorders (audiologist). You may be asked to complete a questionnaire to determine the severity of your tinnitus. Tests may be done to help determine the cause and to rule out other conditions. These can include:  · Imaging studies of your head and brain, such as:    A CT scan.    An MRI.  · An imaging study of your blood vessels (angiogram).  TREATMENT   Treating an underlying medical condition can sometimes make tinnitus go away. If your tinnitus continues, other treatments may include:  · Medicines, such as certain antidepressants or sleeping aids.  · Sound generators to mask the tinnitus. These include:  ¨ Tabletop sound machines that play relaxing sounds to help you fall asleep.  ¨ Wearable devices that fit in your ear and play sounds or music.  ¨ A small device that uses headphones to deliver a signal embedded in music (acoustic neural stimulation). In time, this may change the pathways of your brain and make you less sensitive to tinnitus. This device is used for very severe cases when no other treatment is working.  · Therapy and counseling to help you manage the stress of living with tinnitus.  · Using hearing aids or cochlear implants, if your tinnitus is related to hearing   loss.  HOME CARE INSTRUCTIONS  · When possible, avoid being in loud places and being exposed to loud sounds.  · Wear hearing protection, such as earplugs, when you are exposed to loud noises.  · Do not take stimulants, such as nicotine, alcohol, or caffeine.  · Practice techniques for reducing stress, such as meditation, yoga, or deep breathing.  · Use a white noise machine, a humidifier, or other devices to mask the sound of tinnitus.  · Sleep with your head slightly raised. This may reduce the impact of tinnitus.  · Try to get plenty of rest each night.  SEEK MEDICAL CARE IF:  · You have tinnitus in just one ear.  · Your tinnitus continues for 3 weeks or longer without stopping.  · Home care measures are not  helping.  · You have tinnitus after a head injury.  · You have tinnitus along with any of the following:    Dizziness.    Loss of balance.    Nausea and vomiting.     This information is not intended to replace advice given to you by your health care provider. Make sure you discuss any questions you have with your health care provider.     Document Released: 11/28/2005 Document Revised: 12/19/2014 Document Reviewed: 04/30/2014  Elsevier Interactive Patient Education ©2016 [employer] Inc[employer]

## 2016-07-05 NOTE — Progress Notes (Signed)
   Subjective:    Patient ID: Rhonda Thomas, female    DOB: 1956-03-24, 60 y.o.   MRN: [id number]  HPI Patient comes in today c/o loud ringing in her left ear- started about 1 week ago- she has a history of some hearing loss but this is a little different- worsens when she get s up and walks around. she also c/o being very fatigue which started about a week ago- sweaty often but does not feel hot.    Review of Systems  Constitutional: Positive for fatigue.  HENT: Negative.   Respiratory: Negative.   Cardiovascular: Negative.   Genitourinary: Negative.   Neurological: Negative.   Psychiatric/Behavioral: Negative.   All other systems reviewed and are negative.      Objective:   Physical Exam  Constitutional: She is oriented to person, place, and time. [name]. No distress.  HENT:  Right Ear: External ear normal.  Left Ear: External ear normal.  Nose: Nose normal.  Mouth/Throat: Oropharynx is clear and moist.  Eyes: Conjunctivae are normal. Pupils are equal, round, and reactive to light.  Neck: Normal range of motion. Neck supple.  Cardiovascular: Normal rate and normal heart sounds.   Pulmonary/Chest: Effort normal and breath sounds normal.  Lymphadenopathy:    She has no cervical adenopathy.  Neurological: She is oriented to person, place, and time. No cranial nerve deficit.  Skin: Skin is warm.  Psychiatric: She has a normal mood and affect. [name]. Judgment and thought content normal.   BP (!) 142/88 (BP Location: Left Arm, Cuff Size: Normal)   Pulse 74   Ht 5' 9" (1.753 m)   Wt 193 lb (87.5 kg)   BMI 28.50 kg/m         Assessment & Plan:  1. Other fatigue Labs pending rest - BMP8+EGFR - Anemia Profile B - Testosterone,Free and Total  2. Ringing in ears, left Will see what ENT says - Ambulatory referral to ENT  [place], FNP

## 2016-07-06 LAB — ANEMIA PROFILE B
Basophils Absolute: 0 10*3/uL (ref 0.0–0.2)
Basos: 0 %
EOS (ABSOLUTE): 0.2 10*3/uL (ref 0.0–0.4)
Eos: 3 %
Ferritin: 44 ng/mL (ref 15–150)
Folate: 8 ng/mL (ref >3.0)
Hematocrit: 40.7 % (ref 34.0–46.6)
Hemoglobin: 13.7 g/dL (ref 11.1–15.9)
Immature Grans (Abs): 0 10*3/uL (ref 0.0–0.1)
Immature Granulocytes: 0 %
Iron Saturation: 18 % (ref 15–55)
Iron: 67 ug/dL (ref 27–159)
Lymphocytes Absolute: 1.7 10*3/uL (ref 0.7–3.1)
Lymphs: 27 %
MCH: 30.2 pg (ref 26.6–33.0)
MCHC: 33.7 g/dL (ref 31.5–35.7)
MCV: 90 fL (ref 79–97)
Monocytes Absolute: 0.4 10*3/uL (ref 0.1–0.9)
Monocytes: 6 %
Neutrophils Absolute: 4 10*3/uL (ref 1.4–7.0)
Neutrophils: 64 %
Platelets: 216 10*3/uL (ref 150–379)
RBC: 4.54 x10E6/uL (ref 3.77–5.28)
RDW: 13.9 % (ref 12.3–15.4)
Retic Ct Pct: 1.3 % (ref 0.6–2.6)
Total Iron Binding Capacity: 380 ug/dL (ref 250–450)
UIBC: 313 ug/dL (ref 131–425)
Vitamin B-12: 235 pg/mL (ref 211–946)
WBC: 6.2 10*3/uL (ref 3.4–10.8)

## 2016-07-06 LAB — BMP8+EGFR
BUN/Creatinine Ratio: 20 (ref 9–23)
BUN: 11 mg/dL (ref 6–24)
CO2: 26 mmol/L (ref 18–29)
Calcium: 9.3 mg/dL (ref 8.7–10.2)
Chloride: 98 mmol/L (ref 96–106)
Creatinine, Ser: 0.54 mg/dL — ABNORMAL LOW (ref 0.57–1.00)
GFR calc Af Amer: 119 mL/min/{1.73_m2} (ref >59)
GFR calc non Af Amer: 104 mL/min/{1.73_m2} (ref >59)
Glucose: 193 mg/dL — ABNORMAL HIGH (ref 65–99)
Potassium: 5.2 mmol/L (ref 3.5–5.2)
Sodium: 139 mmol/L (ref 134–144)

## 2016-07-06 LAB — TESTOSTERONE,FREE AND TOTAL
Testosterone, Free: 2.4 pg/mL (ref 0.0–4.2)
Testosterone: 25 ng/dL (ref 3–41)

## 2016-07-09 LAB — SPECIMEN STATUS REPORT

## 2016-07-09 LAB — HGB A1C W/O EAG: Hgb A1c MFr Bld: 6.7 % — ABNORMAL HIGH (ref 4.8–5.6)

## 2016-07-09 LAB — TSH: TSH: 0.943 u[IU]/mL (ref 0.450–4.500)

## 2016-07-27 ENCOUNTER — Ambulatory Visit (INDEPENDENT_AMBULATORY_CARE_PROVIDER_SITE_OTHER): Payer: BC Managed Care – PPO | Admitting: Psychology

## 2016-07-27 DIAGNOSIS — F332 Major depressive disorder, recurrent severe without psychotic features: Secondary | ICD-10-CM | POA: Diagnosis not present

## 2016-07-27 DIAGNOSIS — F419 Anxiety disorder, unspecified: Secondary | ICD-10-CM

## 2016-07-28 ENCOUNTER — Encounter (HOSPITAL_COMMUNITY): Payer: Self-pay | Admitting: Psychology

## 2016-07-28 ENCOUNTER — Other Ambulatory Visit: Payer: Self-pay | Admitting: Pediatrics

## 2016-07-28 NOTE — Progress Notes (Signed)
Patient ID: Rhonda Thomas, female   DOB: Nov 07, 1956, 60 y.o.   MRN: [id number] [name]   DOB: July 27, 1956  MR Number: [id number]  Location: [facility] [address] [place] [address] Dept: [phone]  Start:  11 AM End:  12 PM  Provider/Observer:     Edgardo Roys PSYD  Chief Complaint:      Chief Complaint  Patient presents with  . Anxiety  . Depression  . Stress  . Trauma    Reason For Service:     I seen the patient is also known to 4 several years. She was initially referred here for difficulty coping with the situation involving her children. Her youngest son is at severe difficulties over the years with regard to mood disorder, substance abuse and behavioral problems. Now there are major stressors associated with him again the patient has been essentially overwhelmed and unsure about what to do about. She reports that this has created a lot of stress both at home and at work. On top of that, her diabetes has gotten to the point that she had hammertoe amputated because of that. She has neuropathy as a result of diabetes as well and has had to stay out of her foot for 8 weeks.  Interventions Strategy:  Cognitive/behavioral psychotherapeutic interventions  Participation Level:   Active  Participation Quality:  Appropriate      Behavioral Observation:  Well Groomed, Alert, and Depressed.   Current Psychosocial Factors: The patient reports that her situation with her son has continued to be problematic.  He ODed just before our last visit and the patient had to give him Narcan to save his life after he stopped breathing .  He was in jail after getting out of hospital.  THe patient is now back at her house waiting on going to 90 day treatment vs 75 days in jail.  She is having to deal with his anger and aggitation.  She is likely going to go talk with parole officer about putting  him in jail while waiting to go to treatment program.  Content of Session:   Review current symptoms and continued work on building improve coping skills  Current Status:   The patient reports that depression has  Continued to be improved over the first level but it is been. However, she reports continued significant stress. She is coping much better with her severe diabetes and carefully monitoring her blood glucose levels and other issues associated with her diabetes.   Last Reviewed:   07/27/2016  Goals Addressed Today:    Goals addressed included building better coping skills.  Impression/Diagnosis:   The patient has a long history of attention deficit disorder but do to Maj. psychosocial stressors she also developed clinical depression and anxiety. I think that her attentional problems were valid prior to the development of these issues that existed her entire life. However, she is in and repeat if and recurrent trauma with regard primarily to her son and to her daughter to a lesser degree in years past.  Diagnosis:    Axis I:  1. Anxiety    2. Major depressive disorder, recurrent, severe without psychotic features ([place])          Axis II: No diagnosis      RODENBOUGH,JOHN R, PsyD 07/28/2016

## 2016-07-29 ENCOUNTER — Other Ambulatory Visit: Payer: Self-pay | Admitting: Family Medicine

## 2016-08-05 ENCOUNTER — Other Ambulatory Visit: Payer: Self-pay | Admitting: Family Medicine

## 2016-08-05 NOTE — Telephone Encounter (Signed)
Please advise 

## 2016-08-11 ENCOUNTER — Ambulatory Visit (INDEPENDENT_AMBULATORY_CARE_PROVIDER_SITE_OTHER): Payer: BC Managed Care – PPO | Admitting: Psychology

## 2016-08-11 ENCOUNTER — Encounter (HOSPITAL_COMMUNITY): Payer: Self-pay | Admitting: Psychology

## 2016-08-11 DIAGNOSIS — F332 Major depressive disorder, recurrent severe without psychotic features: Secondary | ICD-10-CM

## 2016-08-11 DIAGNOSIS — F419 Anxiety disorder, unspecified: Secondary | ICD-10-CM | POA: Diagnosis not present

## 2016-08-11 NOTE — Progress Notes (Signed)
Patient ID: Rhonda Thomas, female   DOB: 1955-12-20, 60 y.o.   MRN: [id number] [name]   DOB: Aug 03, 1956  MR Number: [id number]  Location: [facility] [address] [place] [address] Dept: [phone]  Start:  9 AM End:  10 AM  Provider/Observer:     Edgardo Roys PSYD  Chief Complaint:      Chief Complaint  Patient presents with  . Anxiety  . Depression    Reason For Service:     I seen the patient is also known to 4 several years. She was initially referred here for difficulty coping with the situation involving her children. Her youngest son is at severe difficulties over the years with regard to mood disorder, substance abuse and behavioral problems. Now there are major stressors associated with him again the patient has been essentially overwhelmed and unsure about what to do about. She reports that this has created a lot of stress both at home and at work. On top of that, her diabetes has gotten to the point that she had hammertoe amputated because of that. She has neuropathy as a result of diabetes as well and has had to stay out of her foot for 8 weeks.  Interventions Strategy:  Cognitive/behavioral psychotherapeutic interventions  Participation Level:   Active  Participation Quality:  Appropriate      Behavioral Observation:  Well Groomed, Alert, and Depressed.   Current Psychosocial Factors: The patient reports that She is still living at her house things to calm down a little bit. The patient reports that her son has not been blowing up and being aggressive over the past couple of weeks but he has continued to take things that are oriented in a very minor monetary value. The patient reports that she is still very frustrated with her husband about his lack and she has been working on at least strategy understanding about what she would do if she was to leave  home..  Content of Session:   Review current symptoms and continued work on building improve coping skills  Current Status:   The patient reports that depression has  Continued to be improved over the first level but it is been. However, she reports continued significant stress. She is coping much better with her severe diabetes and carefully monitoring her blood glucose levels and other issues associated with her diabetes.   Last Reviewed:   08/11/2016  Goals Addressed Today:    Goals addressed included building better coping skills.  Impression/Diagnosis:   The patient has a long history of attention deficit disorder but do to Maj. psychosocial stressors she also developed clinical depression and anxiety. I think that her attentional problems were valid prior to the development of these issues that existed her entire life. However, she is in and repeat if and recurrent trauma with regard primarily to her son and to her daughter to a lesser degree in years past.  Diagnosis:    Axis I:  1. Major depressive disorder, recurrent, severe without psychotic features ([place])    2. Anxiety          Axis II: No diagnosis      Brandt Chaney R, PsyD 08/11/2016

## 2016-08-29 ENCOUNTER — Other Ambulatory Visit: Payer: Self-pay | Admitting: Neurology

## 2016-09-06 ENCOUNTER — Ambulatory Visit (INDEPENDENT_AMBULATORY_CARE_PROVIDER_SITE_OTHER): Payer: BC Managed Care – PPO | Admitting: Psychology

## 2016-09-06 DIAGNOSIS — F332 Major depressive disorder, recurrent severe without psychotic features: Secondary | ICD-10-CM | POA: Diagnosis not present

## 2016-09-06 DIAGNOSIS — F419 Anxiety disorder, unspecified: Secondary | ICD-10-CM

## 2016-09-08 ENCOUNTER — Other Ambulatory Visit: Payer: Self-pay | Admitting: Pediatrics

## 2016-09-08 ENCOUNTER — Encounter (HOSPITAL_COMMUNITY): Payer: Self-pay | Admitting: Psychology

## 2016-09-08 NOTE — Progress Notes (Signed)
Patient ID: Rhonda Thomas, female   DOB: June 14, 1956, 60 y.o.   MRN: [id number] [name]   DOB: 1956/05/02  MR Number: [id number]  Location: [facility] [address] [place] [address] Dept: [phone]  Start:  9 AM End:  10 AM  Provider/Observer:     Edgardo Roys PSYD  Chief Complaint:      Chief Complaint  Patient presents with  . Anxiety  . Depression  . Stress    Reason For Service:     I seen the patient is also known to 4 several years. She was initially referred here for difficulty coping with the situation involving her children. Her youngest son is at severe difficulties over the years with regard to mood disorder, substance abuse and behavioral problems. Now there are major stressors associated with him again the patient has been essentially overwhelmed and unsure about what to do about. She reports that this has created a lot of stress both at home and at work. On top of that, her diabetes has gotten to the point that she had hammertoe amputated because of that. She has neuropathy as a result of diabetes as well and has had to stay out of her foot for 8 weeks.  Interventions Strategy:  Cognitive/behavioral psychotherapeutic interventions  Participation Level:   Active  Participation Quality:  Appropriate      Behavioral Observation:  Well Groomed, Alert, and Depressed.   Current Psychosocial Factors: The patient reports that She is still living at her house things to calm down a little bit. The patient reports that her son has not been blowing up and being aggressive over the past couple of weeks but he has continued to take things that are oriented in a very minor monetary value. The patient reports that she is still very frustrated with her husband about his lack and she has been working on at least strategy understanding about what she would do if she was to leave  home..  Content of Session:   Review current symptoms and continued work on building improve coping skills  Current Status:   The patient reports that depression has  Continued to be improved over the first level but it is been. However, she reports continued significant stress. She is coping much better with her severe diabetes and carefully monitoring her blood glucose levels and other issues associated with her diabetes.   Last Reviewed:   09/06/2016  Goals Addressed Today:    Goals addressed included building better coping skills.  Impression/Diagnosis:   The patient has a long history of attention deficit disorder but do to Maj. psychosocial stressors she also developed clinical depression and anxiety. I think that her attentional problems were valid prior to the development of these issues that existed her entire life. However, she is in and repeat if and recurrent trauma with regard primarily to her son and to her daughter to a lesser degree in years past.  Diagnosis:    Axis I:  1. Major depressive disorder, recurrent, severe without psychotic features ([place])    2. Anxiety          Axis II: No diagnosis      RODENBOUGH,JOHN R, PsyD 09/08/2016

## 2016-09-20 ENCOUNTER — Ambulatory Visit (INDEPENDENT_AMBULATORY_CARE_PROVIDER_SITE_OTHER): Payer: BC Managed Care – PPO | Admitting: Psychology

## 2016-09-20 DIAGNOSIS — F419 Anxiety disorder, unspecified: Secondary | ICD-10-CM | POA: Diagnosis not present

## 2016-09-20 DIAGNOSIS — F332 Major depressive disorder, recurrent severe without psychotic features: Secondary | ICD-10-CM

## 2016-09-23 ENCOUNTER — Encounter (HOSPITAL_COMMUNITY): Payer: Self-pay | Admitting: Psychology

## 2016-09-23 NOTE — Progress Notes (Signed)
Patient ID: Rhonda Thomas, female   DOB: 09/29/1956, 60 y.o.   MRN: [id number] [name]   DOB: Mar 29, 1956  MR Number: [id number]  Location: [facility] [address] [place] [address] Dept: [phone]  Start:  9 AM End:  10 AM  Provider/Observer:     Edgardo Roys PSYD  Chief Complaint:      Chief Complaint  Patient presents with  . Anxiety  . Depression  . Stress    Reason For Service:     I seen the patient is also known to 4 several years. She was initially referred here for difficulty coping with the situation involving her children. Her youngest son is at severe difficulties over the years with regard to mood disorder, substance abuse and behavioral problems. Now there are major stressors associated with him again the patient has been essentially overwhelmed and unsure about what to do about. She reports that this has created a lot of stress both at home and at work. On top of that, her diabetes has gotten to the point that she had hammertoe amputated because of that. She has neuropathy as a result of diabetes as well and has had to stay out of her foot for 8 weeks.  Interventions Strategy:  Cognitive/behavioral psychotherapeutic interventions  Participation Level:   Active  Participation Quality:  Appropriate      Behavioral Observation:  Well Groomed, Alert, and Depressed.   Current Psychosocial Factors: The patient reports that She is still living at her house things to calm down a little bit. The patient reports that her son has not been blowing up and being aggressive over the past couple of weeks but he has continued to take things that are oriented in a very minor monetary value. The patient reports that she is still very frustrated with her husband about his lack and she has been working on at least strategy understanding about what she would do if she was to leave  home..  Content of Session:   Review current symptoms and continued work on building improve coping skills  Current Status:   The patient reports that depression has  Continued to be improved over the first level but it is been. However, she reports continued significant stress. She is coping much better with her severe diabetes and carefully monitoring her blood glucose levels and other issues associated with her diabetes.   Last Reviewed:   09/21/2016  Goals Addressed Today:    Goals addressed included building better coping skills.  Impression/Diagnosis:   The patient has a long history of attention deficit disorder but do to Maj. psychosocial stressors she also developed clinical depression and anxiety. I think that her attentional problems were valid prior to the development of these issues that existed her entire life. However, she is in and repeat if and recurrent trauma with regard primarily to her son and to her daughter to a lesser degree in years past.  Diagnosis:    Axis I:  1. Major depressive disorder, recurrent, severe without psychotic features ([place])    2. Anxiety          Axis II: No diagnosis      RODENBOUGH,JOHN R, PsyD 09/23/2016

## 2016-10-10 ENCOUNTER — Encounter (HOSPITAL_COMMUNITY): Payer: Self-pay | Admitting: Psychology

## 2016-10-10 ENCOUNTER — Ambulatory Visit (INDEPENDENT_AMBULATORY_CARE_PROVIDER_SITE_OTHER): Payer: BC Managed Care – PPO | Admitting: Psychology

## 2016-10-10 DIAGNOSIS — F332 Major depressive disorder, recurrent severe without psychotic features: Secondary | ICD-10-CM | POA: Diagnosis not present

## 2016-10-10 DIAGNOSIS — F419 Anxiety disorder, unspecified: Secondary | ICD-10-CM | POA: Diagnosis not present

## 2016-10-10 NOTE — Progress Notes (Signed)
Patient ID: Rhonda Thomas, female   DOB: 01/22/1956, 60 y.o.   MRN: [id number] [name]   DOB: 1956-06-10  MR Number: [id number]  Location: [facility] [place] [address] Dept: [phone]  Start:  10 AM End:  11 AM  Provider/Observer:     Edgardo Roys PSYD  Chief Complaint:      Chief Complaint  Patient presents with  . Anxiety  . Depression  . Stress    Reason For Service:     I seen the patient is also known to 4 several years. She was initially referred here for difficulty coping with the situation involving her children. Her youngest son is at severe difficulties over the years with regard to mood disorder, substance abuse and behavioral problems. Now there are major stressors associated with him again the patient has been essentially overwhelmed and unsure about what to do about. She reports that this has created a lot of stress both at home and at work. On top of that, her diabetes has gotten to the point that she had hammertoe amputated because of that. She has neuropathy as a result of diabetes as well and has had to stay out of her foot for 8 weeks.  Interventions Strategy:  Cognitive/behavioral psychotherapeutic interventions  Participation Level:   Active  Participation Quality:  Appropriate      Behavioral Observation:  Well Groomed, Alert, and Depressed.   Current Psychosocial Factors: The patient came in today and her son adam came in for a brief period.  The son is the major stressor for patient.  The son has been abusing opiates for some time, but has (reportedly) stopped for 6 weeks.  However, he is made that his mother still believes he might be using.  We addressed this together before the son left with better understanding.  Content of Session:   Review current symptoms and continued work on building improve coping skills  Current Status:   The  patient reports that depression has been a little better.  Some reduction in overall stress.  The patient may have Tens Unit implated due to pain for diabetes (neuropathy) as well as the amputation of several toes.   Last Reviewed:   10/10/2016  Goals Addressed Today:    Goals addressed included building better coping skills.  Impression/Diagnosis:   The patient has a long history of attention deficit disorder but do to Maj. psychosocial stressors she also developed clinical depression and anxiety. I think that her attentional problems were valid prior to the development of these issues that existed her entire life. However, she is in and repeat if and recurrent trauma with regard primarily to her son and to her daughter to a lesser degree in years past.  Diagnosis:    Axis I:  1. Major depressive disorder, recurrent, severe without psychotic features ([place])    2. Anxiety          Axis II: No diagnosis      Jamey Demchak R, PsyD 10/10/2016

## 2016-10-23 ENCOUNTER — Other Ambulatory Visit: Payer: Self-pay | Admitting: Pediatrics

## 2016-10-24 ENCOUNTER — Ambulatory Visit (INDEPENDENT_AMBULATORY_CARE_PROVIDER_SITE_OTHER): Payer: BC Managed Care – PPO | Admitting: Family

## 2016-10-24 ENCOUNTER — Encounter: Payer: Self-pay | Admitting: Family

## 2016-10-24 VITALS — BP 118/73 | HR 94 | Temp 97.4°F | Ht 69.0 in | Wt 193.0 lb

## 2016-10-24 DIAGNOSIS — E1142 Type 2 diabetes mellitus with diabetic polyneuropathy: Secondary | ICD-10-CM | POA: Diagnosis not present

## 2016-10-24 DIAGNOSIS — E1140 Type 2 diabetes mellitus with diabetic neuropathy, unspecified: Secondary | ICD-10-CM

## 2016-10-24 DIAGNOSIS — G2581 Restless legs syndrome: Secondary | ICD-10-CM | POA: Diagnosis not present

## 2016-10-24 DIAGNOSIS — E663 Overweight: Secondary | ICD-10-CM | POA: Diagnosis not present

## 2016-10-24 LAB — BAYER DCA HB A1C WAIVED: HB A1C (BAYER DCA - WAIVED): 6.6 % (ref <7.0)

## 2016-10-24 MED ORDER — CARBIDOPA-LEVODOPA 25-100 MG PO TABS: 1.0000 | ORAL_TABLET | Freq: Three times a day (TID) | ORAL | 1 refills | Status: DC

## 2016-10-24 MED ORDER — OXCARBAZEPINE 150 MG PO TABS: ORAL_TABLET | ORAL | 0 refills | Status: DC

## 2016-10-24 NOTE — Patient Instructions (Signed)

## 2016-10-24 NOTE — Progress Notes (Addendum)
   Subjective:    Patient ID: Rhonda Thomas, female    DOB: 06/28/1956, 60 y.o.   MRN: [id number]  PT presents to the office today for peripheral neuropathy and restless leg syndrome. PT states she was seeing a neurologists who prescribed her oxcarbazepin 300 mg at bedtime and Sinemet 25-100 mg TID. Pt states this with Norco and Lyrica controls her neuropathy pain. Pt states she is followed by Pain Clinic every month. PT is waiting to have a stimulator placed that will hopefully help with the neuropathy. PT states she has constant pain of 2 out 10.  Foot Pain   Diabetes  She presents for her follow-up diabetic visit. She has type 2 diabetes mellitus. Her disease course has been worsening. There are no hypoglycemic associated symptoms. Associated symptoms include foot paresthesias and foot ulcerations. Pertinent negatives for diabetes include no blurred vision. There are no hypoglycemic complications. Diabetic complications include nephropathy and peripheral neuropathy. Pertinent negatives for diabetic complications include no CVA or heart disease. Risk factors for coronary artery disease include obesity, hypertension, sedentary lifestyle and family history. Current diabetic treatment includes oral agent (triple therapy). She is compliant with treatment all of the time. She is following a generally unhealthy diet. Her breakfast blood glucose range is generally 110-130 mg/dl. She sees a podiatrist.Eye exam is not current.     Review of Systems  Eyes: Negative for blurred vision.  Musculoskeletal: Positive for gait problem.  All other systems reviewed and are negative.      Objective:   Physical Exam  Constitutional: She is oriented to person, place, and time. [name]. No distress.  HENT:  Head: Normocephalic.  Cardiovascular: Normal rate, regular rhythm, normal heart sounds and intact distal pulses.   No murmur heard. Pulmonary/Chest: Effort normal and  breath sounds normal. No respiratory distress. She has no wheezes.  Abdominal: Soft. Bowel sounds are normal. She exhibits no distension. There is no tenderness.  Musculoskeletal: Normal range of motion. She exhibits tenderness (bilateral foot tenderness). She exhibits no edema.  Neurological: She is alert and oriented to person, place, and time.  Skin: Skin is warm and dry.  Psychiatric: She has a normal mood and affect. [name]. Judgment and thought content normal.  Vitals reviewed.   BP 118/73   Pulse 94   Temp 97.4 F (36.3 C) (Oral)   Ht 5' 9" (1.753 m)   Wt 193 lb (87.5 kg)   BMI 28.50 kg/m        Assessment & Plan:  1. Diabetic peripheral neuropathy (HCC) - CMP14+EGFR - OXcarbazepine (TRILEPTAL) 150 MG tablet; Take 1 tablet in the morning and 2 tablets at bedtime.  Dispense: 90 tablet; Refill: 0 - Ambulatory referral to Ophthalmology  2. Type 2 diabetes mellitus with diabetic neuropathy, unspecified long term insulin use status (HCC) -Low carb diet -Take medications  - Bayer DCA Hb A1c Waived - CMP14+EGFR - Ambulatory referral to Ophthalmology  3. Restless legs syndrome (RLS) -Avoid caffeine  - CMP14+EGFR - carbidopa-levodopa (SINEMET IR) 25-100 MG tablet; Take 1 tablet by mouth 3 (three) times daily.  Dispense: 90 tablet; Refill: 1  4. Overweight (BMI 25.0-29.9) - CMP14+EGFR  Christy Hawks, FNP  

## 2016-10-25 LAB — CMP14+EGFR
ALT: 7 IU/L (ref 0–32)
AST: 8 IU/L (ref 0–40)
Albumin/Globulin Ratio: 1.5 (ref 1.2–2.2)
Albumin: 3.8 g/dL (ref 3.6–4.8)
Alkaline Phosphatase: 79 IU/L (ref 39–117)
BUN/Creatinine Ratio: 20 (ref 12–28)
BUN: 15 mg/dL (ref 8–27)
Bilirubin Total: <0.2 mg/dL (ref 0.0–1.2)
CO2: 28 mmol/L (ref 18–29)
Calcium: 9 mg/dL (ref 8.7–10.3)
Chloride: 98 mmol/L (ref 96–106)
Creatinine, Ser: 0.75 mg/dL (ref 0.57–1.00)
GFR calc Af Amer: 100 mL/min/{1.73_m2} (ref >59)
GFR calc non Af Amer: 87 mL/min/{1.73_m2} (ref >59)
Globulin, Total: 2.5 g/dL (ref 1.5–4.5)
Glucose: 204 mg/dL — ABNORMAL HIGH (ref 65–99)
Potassium: 4.7 mmol/L (ref 3.5–5.2)
Sodium: 139 mmol/L (ref 134–144)
Total Protein: 6.3 g/dL (ref 6.0–8.5)

## 2016-11-05 ENCOUNTER — Other Ambulatory Visit: Payer: Self-pay | Admitting: Pediatrics

## 2016-11-09 ENCOUNTER — Ambulatory Visit (HOSPITAL_COMMUNITY): Payer: Self-pay | Admitting: Psychology

## 2016-11-09 ENCOUNTER — Telehealth (HOSPITAL_COMMUNITY): Payer: Self-pay | Admitting: *Deleted

## 2016-11-09 NOTE — Telephone Encounter (Signed)
phone call to confirm patient received message left yesterday.   Patient's husband answered, said patient received message and will call back.

## 2016-11-14 ENCOUNTER — Other Ambulatory Visit: Payer: Self-pay | Admitting: Family Medicine

## 2016-11-23 ENCOUNTER — Ambulatory Visit: Payer: BC Managed Care – PPO | Admitting: Family

## 2016-11-28 ENCOUNTER — Ambulatory Visit: Payer: BC Managed Care – PPO | Admitting: Family

## 2016-11-28 ENCOUNTER — Encounter: Payer: Self-pay | Admitting: Physician Assistant

## 2016-11-28 ENCOUNTER — Ambulatory Visit (INDEPENDENT_AMBULATORY_CARE_PROVIDER_SITE_OTHER): Payer: BC Managed Care – PPO | Admitting: Physician Assistant

## 2016-11-28 VITALS — BP 135/87 | HR 81 | Temp 97.2°F | Ht 69.0 in | Wt 193.2 lb

## 2016-11-28 DIAGNOSIS — J0100 Acute maxillary sinusitis, unspecified: Secondary | ICD-10-CM | POA: Diagnosis not present

## 2016-11-28 DIAGNOSIS — L97521 Non-pressure chronic ulcer of other part of left foot limited to breakdown of skin: Secondary | ICD-10-CM | POA: Diagnosis not present

## 2016-11-28 MED ORDER — METHYLPREDNISOLONE ACETATE 80 MG/ML IJ SUSP
80.0000 mg | Freq: Once | INTRAMUSCULAR | Status: AC
  Administered 2016-11-28: 80 mg via INTRAMUSCULAR

## 2016-11-28 MED ORDER — DULOXETINE HCL 60 MG PO CPEP: 60.0000 mg | ORAL_CAPSULE | Freq: Every day | ORAL | 0 refills | Status: DC

## 2016-11-28 MED ORDER — DOXYCYCLINE HYCLATE 100 MG PO TABS: 100.0000 mg | ORAL_TABLET | Freq: Two times a day (BID) | ORAL | 0 refills | Status: DC

## 2016-11-28 MED ORDER — FLUTICASONE PROPIONATE 50 MCG/ACT NA SUSP: 1.0000 | Freq: Two times a day (BID) | NASAL | 6 refills | Status: DC

## 2016-11-28 NOTE — Patient Instructions (Signed)

## 2016-11-28 NOTE — Progress Notes (Signed)
BP 135/87   Pulse 81   Temp 97.2 F (36.2 C) (Oral)   Ht 5\' 9"  (1.753 m)   Wt 193 lb 3.2 oz (87.6 kg)   BMI 28.53 kg/m    Subjective:    Patient ID: Rhonda Thomas, female    DOB: 07-23-1956, 60 y.o.   MRN: [id number]  HPI: Rhonda Thomas is a 60 y.o. female presenting on 11/28/2016 for Ear Pain; Headache; Tinnitus; Sinusitis; and Burn (on bottom of left foot )  This patient has had known peripheral neuropathy and cellulitis in her other foot. She did lose a couple of toes. She was using her heating pad to warm her feet. She noticed the burn on Saturday night. She thinks it could have been there is early as Friday or Saturday. She has doxycycline at home that she is able to start immediately when she has any type of infection. She is a patient of Dr. Tally Due.  She is also having significant sinus pressure and drainage. There is green color to the mucus. She does have facial pain. She denies any fever or chills.  Relevant past medical, surgical, family and social history reviewed and updated as indicated. Allergies and medications reviewed and updated.  Past Medical History:  Diagnosis Date  . Anxiety   . Attention deficit disorder (ADD)   . Depression   . Diabetes ([place])   . Hiatal hernia   . Movement disorder   . Neuropathy ([place])   . Schatzki's ring   . Sleep apnea     Past Surgical History:  Procedure Laterality Date  . ABDOMINAL HYSTERECTOMY    . BREAST LUMPECTOMY WITH RADIOACTIVE SEED LOCALIZATION Right 04/25/2016   Procedure: RIGHT BREAST LUMPECTOMY WITH RADIOACTIVE SEED LOCALIZATION;  Surgeon: Autumn Messing III, MD;  Location: [facility];  Service: General;  Laterality: Right;  . CHOLECYSTECTOMY    . RECTOCELE REPAIR    . TOE AMPUTATION Right    2nd toe rt foot  . TUBAL LIGATION      Review of Systems  Constitutional: Positive for chills and fatigue. Negative for activity change and appetite change.  HENT: Positive for congestion, postnasal drip and sore  throat.   Eyes: Negative.   Respiratory: Positive for cough and wheezing.   Cardiovascular: Negative.  Negative for chest pain, palpitations and leg swelling.  Gastrointestinal: Negative.   Genitourinary: Negative.   Musculoskeletal: Negative.   Skin: Positive for color change and wound.  Neurological: Positive for headaches.    Allergies as of 11/28/2016      Reactions   Actos [pioglitazone]    Edema   Invokana [canagliflozin]    vaginitis      Medication List       Accurate as of 11/28/16 11:06 AM. Always use your most recent med list.          acetaminophen 500 MG tablet Commonly known as:  TYLENOL Take 500 mg by mouth every 6 (six) hours as needed.   BASAGLAR KWIKPEN 100 UNIT/ML Sopn INJECT 20 UNITS DAILY   carbidopa-levodopa 25-100 MG tablet Commonly known as:  SINEMET IR Take 1 tablet by mouth 3 (three) times daily.   doxycycline 100 MG tablet Commonly known as:  VIBRA-TABS Take 1 tablet (100 mg total) by mouth 2 (two) times daily.   DULoxetine 60 MG capsule Commonly known as:  CYMBALTA Take 1 capsule (60 mg total) by mouth daily.   fluconazole 150 MG tablet Commonly known as:  DIFLUCAN TAKE 1 TABLET BY  MOUTH ONCE   fluticasone 50 MCG/ACT nasal spray Commonly known as:  FLONASE Place 1 spray into both nostrils 2 (two) times daily.   glucose blood test strip Commonly known as:  ONETOUCH VERIO Test bid, DX E11.40   HYDROcodone-acetaminophen 10-325 MG tablet Commonly known as:  NORCO Take 1-2 tablets by mouth every 4 (four) hours as needed.   LYRICA 150 MG capsule Generic drug:  pregabalin TAKE 1 CAPSULE BY MOUTH THREE TIMES DAILY (COUPON CARD ON FILE BUNAVAIL CARD)   metFORMIN 500 MG 24 hr tablet Commonly known as:  GLUCOPHAGE-XR TAKE 2 TABLETS BY MOUTH EVERY DAY AS DIRECTED   NOVOLOG FLEXPEN 100 UNIT/ML FlexPen Generic drug:  insulin aspart INJECT 3 TO 5 UNITS INTO THE SKIN 3 TIMES DAILY WITH MEALS. THIS IS A 100 DAY SUPPLY   omeprazole  40 MG capsule Commonly known as:  PRILOSEC TAKE 1 CAPSULE BY MOUTH EVERY DAY   onetouch ultrasoft lancets CHECK DAILY AND AS NEEDED   OXcarbazepine 150 MG tablet Commonly known as:  TRILEPTAL Take 1 tablet in the morning and 2 tablets at bedtime.   TRULICITY 1.5 0000000 Sopn Generic drug:  Dulaglutide INJECT 1.5 MG SUBCUTANEOUSLY ONCE EVERY WEEK AS DIRECTED   UNIFINE PENTIPS 31G X 6 MM Misc Generic drug:  Insulin Pen Needle   GLOBAL EASE INJECT PEN NEEDLES 31G X 5 MM Misc Generic drug:  Insulin Pen Needle USE FOUR TIMES DAILY   zolpidem 10 MG tablet Commonly known as:  AMBIEN TAKE 1/2 - 1 TABLET BY MOUTH AT BEDTIME AS NEEDED FOR SLEEP          Objective:    BP 135/87   Pulse 81   Temp 97.2 F (36.2 C) (Oral)   Ht 5\' 9"  (1.753 m)   Wt 193 lb 3.2 oz (87.6 kg)   BMI 28.53 kg/m   Allergies  Allergen Reactions  . Actos [Pioglitazone]     Edema   . Invokana [Canagliflozin]     vaginitis    Physical Exam  Constitutional: She is oriented to person, place, and time. [name].  HENT:  Head: Normocephalic and atraumatic.  Right Ear: Tympanic membrane and external ear normal. No middle ear effusion.  Left Ear: Tympanic membrane and external ear normal.  No middle ear effusion.  Nose: Mucosal edema and rhinorrhea present. Right sinus exhibits no maxillary sinus tenderness. Left sinus exhibits no maxillary sinus tenderness.  Mouth/Throat: Uvula is midline. Posterior oropharyngeal erythema present.  Eyes: Conjunctivae and EOM are normal. Pupils are equal, round, and reactive to light. Right eye exhibits no discharge. Left eye exhibits no discharge.  Neck: Normal range of motion.  Cardiovascular: Normal rate, regular rhythm and normal heart sounds.   Pulmonary/Chest: Effort normal and breath sounds normal. No respiratory distress. She has no wheezes.  Abdominal: Soft.  Lymphadenopathy:    She has no cervical adenopathy.    Neurological: She is alert and oriented to person, place, and time.  Skin: Skin is warm and dry. Burn noted.     Left lateral heel with open ulcer. Dermis is missing. Base is bright red in color. There is no drainage at this time.  Psychiatric: She has a normal mood and affect.        Assessment & Plan:   1. Skin ulcer of left foot, limited to breakdown of skin (HCC) - doxycycline (VIBRA-TABS) 100 MG tablet; Take 1 tablet (100 mg total) by mouth 2 (two) times daily.  Dispense: 20  tablet; Refill: 0 - DULoxetine (CYMBALTA) 60 MG capsule; Take 1 capsule (60 mg total) by mouth daily.  Dispense: 30 capsule; Refill: 0 - fluticasone (FLONASE) 50 MCG/ACT nasal spray; Place 1 spray into both nostrils 2 (two) times daily.  Dispense: 16 g; Refill: 6  2. Acute non-recurrent maxillary sinusitis - methylPREDNISolone acetate (DEPO-MEDROL) injection 80 mg; Inject 1 mL (80 mg total) into the muscle once. - doxycycline (VIBRA-TABS) 100 MG tablet; Take 1 tablet (100 mg total) by mouth 2 (two) times daily.  Dispense: 20 tablet; Refill: 0 - fluticasone (FLONASE) 50 MCG/ACT nasal spray; Place 1 spray into both nostrils 2 (two) times daily.  Dispense: 16 g; Refill: 6   Continue all other maintenance medications as listed above.  Follow up plan: Follow-up as needed or worsening of symptoms. Call office for any issues.  Educational handout given for sinusitis  Terald Sleeper PA-C [facility] [address]  [place], [place] [address] [phone]   11/28/2016, 11:06 AM

## 2016-11-29 ENCOUNTER — Encounter: Payer: Self-pay | Admitting: Family

## 2016-11-29 ENCOUNTER — Ambulatory Visit (INDEPENDENT_AMBULATORY_CARE_PROVIDER_SITE_OTHER): Payer: BC Managed Care – PPO | Admitting: Family

## 2016-11-29 VITALS — BP 135/79 | HR 75 | Temp 97.6°F | Ht 69.0 in | Wt 190.8 lb

## 2016-11-29 DIAGNOSIS — F988 Other specified behavioral and emotional disorders with onset usually occurring in childhood and adolescence: Secondary | ICD-10-CM

## 2016-11-29 DIAGNOSIS — E663 Overweight: Secondary | ICD-10-CM | POA: Diagnosis not present

## 2016-11-29 DIAGNOSIS — Z23 Encounter for immunization: Secondary | ICD-10-CM | POA: Diagnosis not present

## 2016-11-29 DIAGNOSIS — E1140 Type 2 diabetes mellitus with diabetic neuropathy, unspecified: Secondary | ICD-10-CM | POA: Diagnosis not present

## 2016-11-29 DIAGNOSIS — G4700 Insomnia, unspecified: Secondary | ICD-10-CM | POA: Diagnosis not present

## 2016-11-29 DIAGNOSIS — G2581 Restless legs syndrome: Secondary | ICD-10-CM | POA: Diagnosis not present

## 2016-11-29 DIAGNOSIS — E785 Hyperlipidemia, unspecified: Secondary | ICD-10-CM | POA: Diagnosis not present

## 2016-11-29 DIAGNOSIS — F411 Generalized anxiety disorder: Secondary | ICD-10-CM

## 2016-11-29 LAB — BAYER DCA HB A1C WAIVED: HB A1C (BAYER DCA - WAIVED): 6.1 % (ref <7.0)

## 2016-11-29 MED ORDER — ASPIRIN EC 81 MG PO TBEC: 81.0000 mg | DELAYED_RELEASE_TABLET | Freq: Every day | ORAL | 1 refills | Status: DC

## 2016-11-29 NOTE — Progress Notes (Signed)
Subjective:    Patient ID: Rhonda Thomas, female    DOB: 06-19-1956, 60 y.o.   MRN: [id number]  PT presents to the office today for chronic follow up. PT is followed by Pain Management once a month for neuropathy. PT is followed by Podiatry for diabetic ulcer on her foot and has a burn from heating pad on left foot. Pt is followed by behavioral health once a month for GAD and Insomnia.  Diabetes  She presents for her follow-up diabetic visit. She has type 2 diabetes mellitus. Her disease course has been worsening. Hypoglycemia symptoms include nervousness/anxiousness. Associated symptoms include foot paresthesias and foot ulcerations. Pertinent negatives for diabetes include no blurred vision and no visual change. Diabetic complications include peripheral neuropathy. Pertinent negatives for diabetic complications include no CVA, heart disease or nephropathy. Risk factors for coronary artery disease include diabetes mellitus, dyslipidemia, sedentary lifestyle, post-menopausal and family history. Current diabetic treatment includes insulin pump and oral agent (monotherapy). She is compliant with treatment all of the time. Her breakfast blood glucose range is generally 140-180 mg/dl. An ACE inhibitor/angiotensin II receptor blocker is not being taken. She sees a podiatrist.Eye exam is not current.  Hyperlipidemia  This is a chronic problem. The current episode started more than 1 year ago. The problem is uncontrolled. Recent lipid tests were reviewed and are high. Current antihyperlipidemic treatment includes diet change. The current treatment provides mild improvement of lipids. Risk factors for coronary artery disease include diabetes mellitus, dyslipidemia, family history and a sedentary lifestyle.  Anxiety  Presents for follow-up visit. Symptoms include depressed mood, excessive worry, insomnia and nervous/anxious behavior. Patient reports no panic. Symptoms occur most days. The quality of sleep is fair.      Insomnia  Primary symptoms: difficulty falling asleep, premature morning awakening, malaise/fatigue.  The current episode started more than one year. The onset quality is gradual. The problem has been waxing and waning since onset. The symptoms are relieved by medication. The treatment provided moderate relief.  RLS PT currently taking Sinemet. States this is stable at this time. Diabetic Neuropathy PT sees pain management. States she has constant burning 1-2 out 10, but the pain is worse at night 10 out 10.     Review of Systems  Constitutional: Positive for malaise/fatigue.  Eyes: Negative for blurred vision.  Psychiatric/Behavioral: The patient is nervous/anxious and has insomnia.   All other systems reviewed and are negative.      Objective:   Physical Exam  Constitutional: She is oriented to person, place, and time. [name]. No distress.  HENT:  Head: Normocephalic and atraumatic.  Right Ear: External ear normal.  Left Ear: External ear normal.  Nose: Nose normal.  Mouth/Throat: Oropharynx is clear and moist.  Eyes: Pupils are equal, round, and reactive to light.  Neck: Normal range of motion. Neck supple. No thyromegaly present.  Cardiovascular: Normal rate, regular rhythm, normal heart sounds and intact distal pulses.   No murmur heard. Pulmonary/Chest: Effort normal and breath sounds normal. No respiratory distress. She has no wheezes.  Abdominal: Soft. Bowel sounds are normal. She exhibits no distension. There is no tenderness.  Musculoskeletal: Normal range of motion. She exhibits no edema or tenderness.  Neurological: She is alert and oriented to person, place, and time.  Skin: Skin is warm and dry.  Diabetic ulcer on left heel- Has appt with Dr. Irving Shows today  Psychiatric: She has a normal mood and affect. [name]. Judgment and thought content  normal.  Vitals reviewed.     BP 135/79   Pulse 75   Temp  97.6 F (36.4 C) (Oral)   Ht _0  (1.753 m)   Wt 190 lb 12.8 oz (86.5 kg)   BMI 28.18 kg/m      Assessment & Plan:  1. Type 2 diabetes mellitus with diabetic neuropathy, unspecified long term insulin use status (HCC) - Bayer DCA Hb A1c Waived - CMP14+EGFR - Microalbumin / creatinine urine ratio - Ambulatory referral to Ophthalmology  2. Attention deficit disorder, unspecified hyperactivity presence - CMP14+EGFR  3. Hyperlipidemia, unspecified hyperlipidemia type - CMP14+EGFR - Lipid panel - aspirin EC 81 MG tablet; Take 1 tablet (81 mg total) by mouth daily.  Dispense: 90 tablet; Refill: 1  4. Restless legs syndrome (RLS) - CMP14+EGFR  5. Overweight (BMI 25.0-29.9) - CMP14+EGFR  6. Insomnia, unspecified type - CMP14+EGFR  7. Generalized anxiety disorder - CMP14+EGFR   Continue all meds Labs pending Health Maintenance reviewed Diet and exercise encouraged RTO 3 months  Evelina Dun, FNP

## 2016-11-29 NOTE — Patient Instructions (Signed)
Diabetic Neuropathy Diabetic neuropathy is a nerve disease or nerve damage that is caused by diabetes mellitus. About half of all people with diabetes mellitus have some form of nerve damage. Nerve damage is more common in those who have had diabetes mellitus for many years and who generally have not had good control of their blood sugar (glucose) level. Diabetic neuropathy is a common complication of diabetes mellitus. There are three common types of diabetic neuropathy and a fourth type that is less common and less understood:  Peripheral neuropathy-This is the most common type of diabetic neuropathy. It causes damage to the nerves of the feet and legs first and then eventually the hands and arms. The damage affects the ability to sense touch.  Autonomic neuropathy-This type causes damage to the autonomic nervous system, which controls the following functions: ? Heartbeat. ? Body temperature. ? Blood pressure. ? Urination. ? Digestion. ? Sweating. ? Sexual function.  Focal neuropathy-Focal neuropathy can be painful and unpredictable and occurs most often in older adults with diabetes mellitus. It involves a specific nerve or one area and often comes on suddenly. It usually does not cause long-term problems.  Radiculoplexus neuropathy- Sometimes called lumbosacral radiculoplexus neuropathy, radiculoplexus neuropathy affects the nerves of the thighs, hips, buttocks, or legs. It is more common in people with type 2 diabetes mellitus and in older men. It is characterized by debilitating pain, weakness, and atrophy, usually in the thigh muscles.  What are the causes? The cause of peripheral, autonomic, and focal neuropathies is diabetes mellitus that is uncontrolled and high glucose levels. The cause of radiculoplexus neuropathy is unknown. However, it is thought to be caused by inflammation related to uncontrolled glucose levels. What are the signs or symptoms? Peripheral Neuropathy Peripheral  neuropathy develops slowly over time. When the nerves of the feet and legs no longer work there may be:  Burning, stabbing, or aching pain in the legs or feet.  Inability to feel pressure or pain in your feet. This can lead to: ? Thick calluses over pressure areas. ? Pressure sores. ? Ulcers.  Foot deformities.  Reduced ability to feel temperature changes.  Muscle weakness.  Autonomic Neuropathy The symptoms of autonomic neuropathy vary depending on which nerves are affected. Symptoms may include:  Problems with digestion, such as: ? Feeling sick to your stomach (nausea). ? Vomiting. ? Bloating. ? Constipation. ? Diarrhea. ? Abdominal pain.  Difficulty with urination. This occurs if you lose your ability to sense when your bladder is full. Problems include: ? Urine leakage (incontinence). ? Inability to empty your bladder completely (retention).  Rapid or irregular heartbeat (palpitations).  Blood pressure drops when you stand up (orthostatic hypotension). When you stand up you may feel: ? Dizzy. ? Weak. ? Faint.  In men, inability to attain and maintain an erection.  In women, vaginal dryness and problems with decreased sexual desire and arousal.  Problems with body temperature regulation.  Increased or decreased sweating.  Focal Neuropathy  Abnormal eye movements or abnormal alignment of both eyes.  Weakness in the wrist.  Foot drop. This results in an inability to lift the foot properly and abnormal walking or foot movement.  Paralysis on one side of your face (Bell palsy).  Chest or abdominal pain. Radiculoplexus Neuropathy  Sudden, severe pain in your hip, thigh, or buttocks.  Weakness and wasting of thigh muscles.  Difficulty rising from a seated position.  Abdominal swelling.  Unexplained weight loss (usually more than 10 lb [4.5 kg]). How is   this diagnosed? Peripheral Neuropathy Your senses may be tested. Sensory function testing can be  done with:  A light touch using a monofilament.  A vibration with tuning fork.  A sharp sensation with a pin prick.  Other tests that can help diagnose neuropathy are:  Nerve conduction velocity. This test checks the transmission of an electrical current through a nerve.  Electromyography. This shows how muscles respond to electrical signals transmitted by nearby nerves.  Quantitative sensory testing. This is used to assess how your nerves respond to vibrations and changes in temperature.  Autonomic Neuropathy Diagnosis is often based on reported symptoms. Tell your health care provider if you experience:  Dizziness.  Constipation.  Diarrhea.  Inappropriate urination or inability to urinate.  Inability to get or maintain an erection.  Tests that may be done include:  Electrocardiography or Holter monitor. These are tests that can help show problems with the heart rate or heart rhythm.  An X-ray exam may be done.  Focal Neuropathy Diagnosis is made based on your symptoms and what your health care provider finds during your exam. Other tests may be done. They may include:  Nerve conduction velocities. This checks the transmission of electrical current through a nerve.  Electromyography. This shows how muscles respond to electrical signals transmitted by nearby nerves.  Quantitative sensory testing. This test is used to assess how your nerves respond to vibration and changes in temperature.  Radiculoplexus Neuropathy  Often the first thing is to eliminate any other issue or problems that might be the cause, as there is no standard test for diagnosis.  X-ray exam of your spine and lumbar region.  Spinal tap to rule out cancer.  MRI to rule out other lesions. How is this treated? Once nerve damage occurs, it cannot be reversed. The goal of treatment is to keep the disease or nerve damage from getting worse and affecting more nerve fibers. Controlling your blood  glucose level is the key. Most people with radiculoplexus neuropathy see at least a partial improvement over time. You will need to keep your blood glucose and HbA1c levels in the target range determined by your health care provider. Things that help control blood glucose levels include:  Blood glucose monitoring.  Meal planning.  Physical activity.  Diabetes medicine.  Over time, maintaining lower blood glucose levels helps lessen symptoms. Sometimes, prescription pain medicine is needed. Follow these instructions at home:  Do not smoke.  Keep your blood glucose level in the range that you and your health care provider have determined acceptable for you.  Keep your blood pressure level in the range that you and your health care provider have determined acceptable for you.  Eat a well-balanced diet.  Be physically active every day. Include strength training and balance exercises.  Protect your feet. ? Check your feet every day for sores, cuts, blisters, or signs of infection. ? Wear padded socks and supportive shoes. Use orthotic inserts, if necessary. ? Regularly check the insides of your shoes for worn spots. Make sure there are no rocks or other items inside your shoes before you put them on. Contact a health care provider if:  You have burning, stabbing, or aching pain in the legs or feet.  You are unable to feel pressure or pain in your feet.  You develop problems with digestion such as: ? Nausea. ? Vomiting. ? Bloating. ? Constipation. ? Diarrhea. ? Abdominal pain.  You have difficulty with urination, such as: ? Incontinence. ? Retention.    You have palpitations.  You develop orthostatic hypotension. When you stand up you may feel: ? Dizzy. ? Weak. ? Faint.  You cannot attain and maintain an erection (in men).  You have vaginal dryness and problems with decreased sexual desire and arousal (in women).  You have severe pain in your thighs, legs, or  buttocks.  You have unexplained weight loss. This information is not intended to replace advice given to you by your health care provider. Make sure you discuss any questions you have with your health care provider. Document Released: 02/06/2002 Document Revised: 05/05/2016 Document Reviewed: 05/09/2013 [employer] Interactive Patient Education  2017 [employer]  

## 2016-11-30 LAB — CMP14+EGFR
ALT: 18 IU/L (ref 0–32)
AST: 9 IU/L (ref 0–40)
Albumin/Globulin Ratio: 1.7 (ref 1.2–2.2)
Albumin: 3.9 g/dL (ref 3.6–4.8)
Alkaline Phosphatase: 87 IU/L (ref 39–117)
BUN/Creatinine Ratio: 27 (ref 12–28)
BUN: 17 mg/dL (ref 8–27)
Bilirubin Total: 0.3 mg/dL (ref 0.0–1.2)
CO2: 27 mmol/L (ref 18–29)
Calcium: 9.2 mg/dL (ref 8.7–10.3)
Chloride: 99 mmol/L (ref 96–106)
Creatinine, Ser: 0.64 mg/dL (ref 0.57–1.00)
GFR calc Af Amer: 112 mL/min/{1.73_m2} (ref >59)
GFR calc non Af Amer: 97 mL/min/{1.73_m2} (ref >59)
Globulin, Total: 2.3 g/dL (ref 1.5–4.5)
Glucose: 134 mg/dL — ABNORMAL HIGH (ref 65–99)
Potassium: 4.7 mmol/L (ref 3.5–5.2)
Sodium: 140 mmol/L (ref 134–144)
Total Protein: 6.2 g/dL (ref 6.0–8.5)

## 2016-11-30 LAB — MICROALBUMIN / CREATININE URINE RATIO
Creatinine, Urine: 71.8 mg/dL
Microalb/Creat Ratio: <4.2 mg/g creat (ref 0.0–30.0)
Microalbumin, Urine: <3 ug/mL

## 2016-11-30 LAB — LIPID PANEL
Chol/HDL Ratio: 2.9 ratio units (ref 0.0–4.4)
Cholesterol, Total: 170 mg/dL (ref 100–199)
HDL: 58 mg/dL (ref >39)
LDL Calculated: 87 mg/dL (ref 0–99)
Triglycerides: 125 mg/dL (ref 0–149)
VLDL Cholesterol Cal: 25 mg/dL (ref 5–40)

## 2016-12-26 ENCOUNTER — Encounter: Payer: Self-pay | Admitting: Physician Assistant

## 2016-12-26 ENCOUNTER — Ambulatory Visit (INDEPENDENT_AMBULATORY_CARE_PROVIDER_SITE_OTHER): Payer: BC Managed Care – PPO | Admitting: Physician Assistant

## 2016-12-26 VITALS — BP 127/70 | HR 81 | Temp 98.0°F | Ht 69.0 in | Wt 193.0 lb

## 2016-12-26 DIAGNOSIS — H6991 Unspecified Eustachian tube disorder, right ear: Secondary | ICD-10-CM | POA: Diagnosis not present

## 2016-12-26 DIAGNOSIS — H6521 Chronic serous otitis media, right ear: Secondary | ICD-10-CM | POA: Diagnosis not present

## 2016-12-26 MED ORDER — METHYLPREDNISOLONE ACETATE 80 MG/ML IJ SUSP
80.0000 mg | Freq: Once | INTRAMUSCULAR | Status: AC
  Administered 2016-12-26: 80 mg via INTRAMUSCULAR

## 2016-12-26 NOTE — Progress Notes (Signed)
BP 127/70   Pulse 81   Temp 98 F (36.7 C) (Oral)   Ht 5\' 9"  (1.753 m)   Wt 193 lb (87.5 kg)   BMI 28.50 kg/m    Subjective:    Patient ID: Rhonda Thomas, female    DOB: 11/25/1956, 61 y.o.   MRN: [id number]  HPI: Rhonda Thomas is a 61 y.o. female presenting on 12/26/2016 for Ear Pain (x 1 month )  [place] 1 month ago patient had an upper respiratory infection and sinus infection. She was using Flonase and expectorant and having some relief of her head and sinuses. Now she is left with residual ear pain. There was just a brief time where she was completely without pain. She has only been using her nasal spray once a day.  Relevant past medical, surgical, family and social history reviewed and updated as indicated. Allergies and medications reviewed and updated.  Past Medical History:  Diagnosis Date  . Anxiety   . Attention deficit disorder (ADD)   . Depression   . Diabetes ([place])   . Hiatal hernia   . Movement disorder   . Neuropathy ([place])   . Schatzki's ring   . Sleep apnea     Past Surgical History:  Procedure Laterality Date  . ABDOMINAL HYSTERECTOMY    . BREAST LUMPECTOMY WITH RADIOACTIVE SEED LOCALIZATION Right 04/25/2016   Procedure: RIGHT BREAST LUMPECTOMY WITH RADIOACTIVE SEED LOCALIZATION;  Surgeon: Autumn Messing III, MD;  Location: [facility];  Service: General;  Laterality: Right;  . CHOLECYSTECTOMY    . RECTOCELE REPAIR    . TOE AMPUTATION Right    2nd toe rt foot  . TUBAL LIGATION      Review of Systems  Constitutional: Negative.  Negative for activity change, fatigue and fever.  HENT: Positive for ear pain. Negative for congestion and ear discharge.   Eyes: Negative.   Respiratory: Negative.  Negative for cough.   Cardiovascular: Negative.  Negative for chest pain.  Gastrointestinal: Negative.  Negative for abdominal pain.  Endocrine: Negative.   Genitourinary: Negative.  Negative for dysuria.  Musculoskeletal: Negative.   Skin: Negative.     Neurological: Negative.     Allergies as of 12/26/2016      Reactions   Actos [pioglitazone]    Edema   Invokana [canagliflozin]    vaginitis      Medication List       Accurate as of 12/26/16 12:48 PM. Always use your most recent med list.          acetaminophen 500 MG tablet Commonly known as:  TYLENOL Take 500 mg by mouth every 6 (six) hours as needed.   aspirin EC 81 MG tablet Take 1 tablet (81 mg total) by mouth daily.   BASAGLAR KWIKPEN 100 UNIT/ML Sopn INJECT 20 UNITS DAILY   carbidopa-levodopa 25-100 MG tablet Commonly known as:  SINEMET IR Take 1 tablet by mouth 3 (three) times daily.   doxycycline 100 MG tablet Commonly known as:  VIBRA-TABS Take 1 tablet (100 mg total) by mouth 2 (two) times daily.   DULoxetine 60 MG capsule Commonly known as:  CYMBALTA Take 1 capsule (60 mg total) by mouth daily.   fluconazole 150 MG tablet Commonly known as:  DIFLUCAN TAKE 1 TABLET BY MOUTH ONCE   fluticasone 50 MCG/ACT nasal spray Commonly known as:  FLONASE Place 1 spray into both nostrils 2 (two) times daily.   glucose blood test strip Commonly known as:  ONETOUCH VERIO  Test bid, DX E11.40   HYDROcodone-acetaminophen 10-325 MG tablet Commonly known as:  NORCO Take 1-2 tablets by mouth every 4 (four) hours as needed.   LYRICA 150 MG capsule Generic drug:  pregabalin TAKE 1 CAPSULE BY MOUTH THREE TIMES DAILY (COUPON CARD ON FILE BUNAVAIL CARD)   metFORMIN 500 MG 24 hr tablet Commonly known as:  GLUCOPHAGE-XR TAKE 2 TABLETS BY MOUTH EVERY DAY AS DIRECTED   NOVOLOG FLEXPEN 100 UNIT/ML FlexPen Generic drug:  insulin aspart INJECT 3 TO 5 UNITS INTO THE SKIN 3 TIMES DAILY WITH MEALS. THIS IS A 100 DAY SUPPLY   omeprazole 40 MG capsule Commonly known as:  PRILOSEC TAKE 1 CAPSULE BY MOUTH EVERY DAY   onetouch ultrasoft lancets CHECK DAILY AND AS NEEDED   OXcarbazepine 150 MG tablet Commonly known as:  TRILEPTAL Take 1 tablet in the morning and 2  tablets at bedtime.   TRULICITY 1.5 0000000 Sopn Generic drug:  Dulaglutide INJECT 1.5 MG SUBCUTANEOUSLY ONCE EVERY WEEK AS DIRECTED   UNIFINE PENTIPS 31G X 6 MM Misc Generic drug:  Insulin Pen Needle   GLOBAL EASE INJECT PEN NEEDLES 31G X 5 MM Misc Generic drug:  Insulin Pen Needle USE FOUR TIMES DAILY   zolpidem 10 MG tablet Commonly known as:  AMBIEN TAKE 1/2 - 1 TABLET BY MOUTH AT BEDTIME AS NEEDED FOR SLEEP          Objective:    BP 127/70   Pulse 81   Temp 98 F (36.7 C) (Oral)   Ht 5\' 9"  (1.753 m)   Wt 193 lb (87.5 kg)   BMI 28.50 kg/m   Allergies  Allergen Reactions  . Actos [Pioglitazone]     Edema   . Invokana [Canagliflozin]     vaginitis    Physical Exam  Constitutional: She is oriented to person, place, and time. [name].  HENT:  Head: Normocephalic and atraumatic.  Right Ear: Hearing and ear canal normal. No drainage, swelling or tenderness. No mastoid tenderness. Tympanic membrane is not erythematous and not retracted. A middle ear effusion is present.  Left Ear: Hearing, tympanic membrane and ear canal normal. No drainage, swelling or tenderness. No mastoid tenderness.  No middle ear effusion.  Eyes: Conjunctivae and EOM are normal. Pupils are equal, round, and reactive to light.  Cardiovascular: Normal rate, regular rhythm, normal heart sounds and intact distal pulses.   Pulmonary/Chest: Effort normal and breath sounds normal.  Abdominal: Soft. Bowel sounds are normal.  Neurological: She is alert and oriented to person, place, and time. She has normal reflexes.  Skin: Skin is warm and dry. No rash noted.  Psychiatric: She has a normal mood and affect. [name]. Judgment and thought content normal.  Nursing note and vitals reviewed.       Assessment & Plan:   1. Right chronic serous otitis media Hold all decongestants Use Mucinex daily for expectoration Saline spray frequently through  the day Flonase one spray each nostril twice a day - methylPREDNISolone acetate (DEPO-MEDROL) injection 80 mg; Inject 1 mL (80 mg total) into the muscle once.  2. Disorder of right eustachian tube See notes above - methylPREDNISolone acetate (DEPO-MEDROL) injection 80 mg; Inject 1 mL (80 mg total) into the muscle once.   Continue all other maintenance medications as listed above.  Follow up plan: Follow-up as needed or worsening of symptoms. Call office for any issues. Call if not improved, discussed possible ENT evaluation  Educational handout  given for serous otitis  Terald Sleeper PA-C [facility] [address]  [place], [place] [address] [phone]   12/26/2016, 12:48 PM

## 2016-12-26 NOTE — Patient Instructions (Signed)
Serous Otitis Media Serous otitis media is fluid in the middle ear space. This space contains the bones for hearing and air. Air in the middle ear space helps to transmit sound.  The air gets there through the eustachian tube. This tube goes from the back of the nose (nasopharynx) to the middle ear space. It keeps the pressure in the middle ear the same as the outside world. It also helps to drain fluid from the middle ear space. CAUSES  Serous otitis media occurs when the eustachian tube gets blocked. Blockage can come from:  Ear infections.  Colds and other upper respiratory infections.  Allergies.  Irritants such as cigarette smoke.  Sudden changes in air pressure (such as descending in an airplane).  Enlarged adenoids.  A mass in the nasopharynx. During colds and upper respiratory infections, the middle ear space can become temporarily filled with fluid. This can happen after an ear infection also. Once the infection clears, the fluid will generally drain out of the ear through the eustachian tube. If it does not, then serous otitis media occurs. SIGNS AND SYMPTOMS   Hearing loss.  A feeling of fullness in the ear, without pain.  Young children may not show any symptoms but may show slight behavioral changes, such as agitation, ear pulling, or crying. DIAGNOSIS  Serous otitis media is diagnosed by an ear exam. Tests may be done to check on the movement of the eardrum. Hearing exams may also be done. TREATMENT  The fluid most often goes away without treatment. If allergy is the cause, allergy treatment may be helpful. Fluid that persists for several months may require minor surgery. A small tube is placed in the eardrum to:  Drain the fluid.  Restore the air in the middle ear space. In certain situations, antibiotic medicines are used to avoid surgery. Surgery may be done to remove enlarged adenoids (if this is the cause). HOME CARE INSTRUCTIONS   Keep children away from  tobacco smoke.  Keep all follow-up visits as directed by your health care provider. SEEK MEDICAL CARE IF:   Your hearing is not better in 3 months.  Your hearing is worse.  You have ear pain.  You have drainage from the ear.  You have dizziness.  You have serous otitis media only in one ear or have any bleeding from your nose (epistaxis).  You notice a lump on your neck. MAKE SURE YOU:  Understand these instructions.   Will watch your condition.   Will get help right away if you are not doing well or get worse.  This information is not intended to replace advice given to you by your health care provider. Make sure you discuss any questions you have with your health care provider. Document Released: 02/18/2004 Document Revised: 12/19/2014 Document Reviewed: 06/25/2013 Elsevier Interactive Patient Education  2017 [employer].

## 2017-01-16 ENCOUNTER — Other Ambulatory Visit: Payer: Self-pay | Admitting: Pediatrics

## 2017-02-06 ENCOUNTER — Other Ambulatory Visit: Payer: Self-pay | Admitting: Family

## 2017-02-06 DIAGNOSIS — E1142 Type 2 diabetes mellitus with diabetic polyneuropathy: Secondary | ICD-10-CM

## 2017-02-06 DIAGNOSIS — G2581 Restless legs syndrome: Secondary | ICD-10-CM

## 2017-02-16 ENCOUNTER — Other Ambulatory Visit: Payer: Self-pay | Admitting: Nurse Practitioner

## 2017-02-25 ENCOUNTER — Encounter: Payer: Self-pay | Admitting: Nurse Practitioner

## 2017-02-25 ENCOUNTER — Ambulatory Visit (INDEPENDENT_AMBULATORY_CARE_PROVIDER_SITE_OTHER): Payer: BC Managed Care – PPO | Admitting: Nurse Practitioner

## 2017-02-25 VITALS — BP 129/86 | HR 85 | Temp 97.0°F | Ht 69.0 in | Wt 194.0 lb

## 2017-02-25 DIAGNOSIS — R300 Dysuria: Secondary | ICD-10-CM | POA: Diagnosis not present

## 2017-02-25 DIAGNOSIS — N3001 Acute cystitis with hematuria: Secondary | ICD-10-CM | POA: Diagnosis not present

## 2017-02-25 MED ORDER — CIPROFLOXACIN HCL 500 MG PO TABS: 500.0000 mg | ORAL_TABLET | Freq: Two times a day (BID) | ORAL | 0 refills | Status: DC

## 2017-02-25 NOTE — Patient Instructions (Signed)
Asymptomatic Bacteriuria Asymptomatic bacteriuria is the presence of a large number of bacteria in the urine without the usual symptoms of burning or frequent urination. What are the causes? This condition is caused by an increase in bacteria in the urine. This increase can be caused by:  Bacteria entering the urinary tract, such as during sex.  A blockage in the urinary tract, such as from kidney stones or a tumor.  Bladder problems that prevent the bladder from emptying.  What increases the risk? You are more likely to develop this condition if:  You have diabetes mellitus.  You are an elderly adult, especially if you are also in a long-term care facility.  You are pregnant and in the first trimester.  You have kidney stones.  You are female.  You have had a kidney transplant.  You have a leaky kidney tube valve (reflux).  You had a urinary catheter for a long period of time.  What are the signs or symptoms? There are no symptoms of this condition. How is this diagnosed? This condition is diagnosed with a urine test. Because this condition does not cause symptoms, it is usually diagnosed when a urine sample is taken to treat or diagnose another condition, such as pregnancy or kidney problems. Most women who are in their first trimester of pregnancy are screened for asymptomatic bacteriuria. How is this treated? Usually, treatment is not needed for this condition. Treating the condition can lead to other problems, such as a yeast infection or the growth of bacteria that do not respond to treatment (antibiotic-resistant bacteria). Some people, such as pregnant women and people with kidney transplants, do need treatment with antibiotic medicines to prevent kidney infection (pyelonephritis). In pregnant women, kidney infection can lead to premature labor, fetal growth restriction, or newborn death. Follow these instructions at home: Medicines  Take over-the-counter and  prescription medicines only as told by your health care provider.  If you were prescribed an antibiotic medicine, take it as told by your health care provider. Do not stop taking the antibiotic even if you start to feel better. General instructions  Monitor your condition for any changes.  Drink enough fluid to keep your urine clear or pale yellow.  Go to the bathroom more often to keep your bladder empty.  If you are female, keep the area around your vagina and rectum clean. Wipe yourself from front to back after urinating.  Keep all follow-up visits as told by your health care provider. This is important. Contact a health care provider if:  You notice any new symptoms, such as back pain or burning while urinating. Get help right away if:  You develop signs of an infection such as: ? A burning sensation when you urinate. ? Have pain when you urinate. ? Develop an intense need to urinate. ? Urinating more frequently. ? Back pain or pelvic pain. ? Fever or chills.  You have blood in your urine.  Your urine becomes discolored or cloudy.  Your urine smells bad.  You have severe pain that cannot be controlled with medicine. Summary  Asymptomatic bacteriuria is the presence of a large number of bacteria in the urine without the usual symptoms of burning or frequent urination.  Usually, treatment is not needed for this condition. Treating the condition can lead to other problems, such as too much yeast and the growth of antibiotic-resistant bacteria.  Some people, such as pregnant women and people with kidney transplants, do need treatment with antibiotic medicines to prevent   kidney infection (pyelonephritis).  If you were prescribed an antibiotic medicine, take it as told by your health care provider. Do not stop taking the antibiotic even if you start to feel better. This information is not intended to replace advice given to you by your health care provider. Make sure you  discuss any questions you have with your health care provider. Document Released: 11/28/2005 Document Revised: 11/22/2016 Document Reviewed: 11/22/2016 Elsevier Interactive Patient Education  2017 [employer]  

## 2017-02-25 NOTE — Progress Notes (Signed)
   Subjective:    Patient ID: Rhonda Thomas, female    DOB: [birth year], 61 y.o.   MRN: [id number]  HPI patient comes in today c/o dysuria- started 2 days ago- now urine has odor and she is voiding frequently with scant amounts.    Review of Systems  Constitutional: Negative.   Respiratory: Negative.   Cardiovascular: Negative.   Gastrointestinal: Positive for abdominal pain (mild supra pubic pressure).  Genitourinary: Positive for dysuria, frequency and urgency.  Musculoskeletal: Negative.   Neurological: Negative.   Psychiatric/Behavioral: Negative.   All other systems reviewed and are negative.      Objective:   Physical Exam  Constitutional: She is oriented to person, place, and time. [name]. No distress.  Cardiovascular: Normal rate and regular rhythm.   Pulmonary/Chest: Effort normal and breath sounds normal.  Abdominal: There is tenderness (mild suprapubic presure on palpation).  Genitourinary:  Genitourinary Comments: No CVA tenderness  Neurological: She is alert and oriented to person, place, and time.  Skin: Skin is warm and dry.  Psychiatric: She has a normal mood and affect. [name]. Judgment and thought content normal.    BP 129/86   Pulse 85   Temp 97 F (36.1 C) (Oral)   Ht 5\' 9"  (1.753 m)   Wt 194 lb (88 kg)   BMI 28.65 kg/m   Urine 3+blood 3+ leuks     Assessment & Plan:  1. Dysuria - Urinalysis  2. Acute cystitis with hematuria Take medication as prescribe Cotton underwear Take shower not bath Cranberry juice, yogurt Force fluids AZO over the counter X2 days Culture pending RTO prn  - ciprofloxacin (CIPRO) 500 MG tablet; Take 1 tablet (500 mg total) by mouth 2 (two) times daily.  Dispense: 14 tablet; Refill: 0 - Urine culture  Mary-Margaret Hassell Done, FNP

## 2017-02-26 LAB — URINE CULTURE

## 2017-02-27 LAB — URINALYSIS
Bilirubin, UA: NEGATIVE
Glucose, UA: NEGATIVE
Ketones, UA: NEGATIVE
Nitrite, UA: NEGATIVE
Specific Gravity, UA: 1.025 (ref 1.005–1.030)
Urobilinogen, Ur: 0.2 mg/dL (ref 0.2–1.0)
pH, UA: 6 (ref 5.0–7.5)

## 2017-03-07 ENCOUNTER — Encounter: Payer: Self-pay | Admitting: Nurse Practitioner

## 2017-03-07 ENCOUNTER — Other Ambulatory Visit: Payer: Self-pay | Admitting: Nurse Practitioner

## 2017-03-07 DIAGNOSIS — N3001 Acute cystitis with hematuria: Secondary | ICD-10-CM

## 2017-03-07 NOTE — Telephone Encounter (Signed)
Left message stating that patient will need to be seen for refill on Cipro

## 2017-03-07 NOTE — Telephone Encounter (Signed)
ntbs for antibiotic refill

## 2017-04-28 ENCOUNTER — Other Ambulatory Visit: Payer: Self-pay

## 2017-04-28 DIAGNOSIS — F339 Major depressive disorder, recurrent, unspecified: Secondary | ICD-10-CM

## 2017-05-01 ENCOUNTER — Other Ambulatory Visit: Payer: Self-pay | Admitting: Family

## 2017-05-05 ENCOUNTER — Encounter: Payer: BC Managed Care – PPO | Attending: Psychology | Admitting: Psychology

## 2017-05-05 ENCOUNTER — Other Ambulatory Visit: Payer: Self-pay | Admitting: *Deleted

## 2017-05-08 ENCOUNTER — Other Ambulatory Visit: Payer: Self-pay | Admitting: Family

## 2017-05-08 DIAGNOSIS — E1142 Type 2 diabetes mellitus with diabetic polyneuropathy: Secondary | ICD-10-CM

## 2017-05-09 ENCOUNTER — Other Ambulatory Visit: Payer: Self-pay | Admitting: Family

## 2017-05-09 DIAGNOSIS — G2581 Restless legs syndrome: Secondary | ICD-10-CM

## 2017-05-17 ENCOUNTER — Other Ambulatory Visit: Payer: Self-pay | Admitting: Family Medicine

## 2017-05-17 ENCOUNTER — Other Ambulatory Visit: Payer: Self-pay | Admitting: Family

## 2017-05-24 ENCOUNTER — Encounter: Payer: Medicare Other | Attending: Psychology | Admitting: Psychology

## 2017-05-24 DIAGNOSIS — E1140 Type 2 diabetes mellitus with diabetic neuropathy, unspecified: Secondary | ICD-10-CM | POA: Diagnosis not present

## 2017-05-24 DIAGNOSIS — F331 Major depressive disorder, recurrent, moderate: Secondary | ICD-10-CM

## 2017-05-24 DIAGNOSIS — Z713 Dietary counseling and surveillance: Secondary | ICD-10-CM | POA: Insufficient documentation

## 2017-05-31 ENCOUNTER — Encounter: Payer: Self-pay | Admitting: Family Medicine

## 2017-05-31 ENCOUNTER — Ambulatory Visit (INDEPENDENT_AMBULATORY_CARE_PROVIDER_SITE_OTHER): Payer: Medicare Other | Admitting: Family Medicine

## 2017-05-31 ENCOUNTER — Other Ambulatory Visit: Payer: Self-pay | Admitting: Family

## 2017-05-31 VITALS — BP 108/60 | HR 94 | Temp 97.4°F | Ht 69.0 in | Wt 196.0 lb

## 2017-05-31 DIAGNOSIS — L03031 Cellulitis of right toe: Secondary | ICD-10-CM | POA: Diagnosis not present

## 2017-05-31 DIAGNOSIS — Z23 Encounter for immunization: Secondary | ICD-10-CM

## 2017-05-31 MED ORDER — AMOXICILLIN-POT CLAVULANATE 875-125 MG PO TABS: 1.0000 | ORAL_TABLET | Freq: Two times a day (BID) | ORAL | 0 refills | Status: DC

## 2017-05-31 NOTE — Addendum Note (Signed)
Addended by: Marylin Crosby on: 05/31/2017 04:01 PM   Modules accepted: Orders

## 2017-05-31 NOTE — Progress Notes (Signed)
Chief Complaint  Patient presents with  . Open Wound    pt here today after cutting an ingrown toenail out yesterday and she thinks she might have cut part of the skin because it bled a lot and just wants it looked at.    HPI  Patient presents today for Redness swelling and pain around a toe that she was working on to remove an ingrown nail on her own yesterday. She thinks she cut the skin. Of note is that she has had multiple ingrown toenails and several have been removed by a local podiatrist, Dr. Irving Shows. He wasn't going to be in today so she decided to work on it herself.  PMH: Smoking status noted ROS: Per HPI  Objective: BP 108/60   Pulse 94   Temp 97.4 F (36.3 C) (Oral)   Ht 5\' 9"  (1.753 m)   Wt 196 lb (88.9 kg)   BMI 28.94 kg/m  Gen: NAD, alert, cooperative with exam HEENT: NCAT, EOMI, aly Ext: No leg edema, warm. The right third toe has erythema to the base of the toe just proximal to the PIP. The toe nail has been removed and is covered by a thick crust. There is mild clinical edema of the distal toe only. Neuro: Alert and oriented, No gross deficits  Assessment and plan:  1. Infection of nail bed of toe of right foot     Meds ordered this encounter  Medications  . amoxicillin-clavulanate (AUGMENTIN) 875-125 MG tablet    Sig: Take 1 tablet by mouth 2 (two) times daily. Take all of this medication    Dispense:  20 tablet    Refill:  0    Wound care reviewed. Signs and symptoms of infection discussed  Follow up as needed.  Claretta Fraise, MD

## 2017-06-10 ENCOUNTER — Encounter: Payer: Self-pay | Admitting: Psychology

## 2017-06-10 NOTE — Progress Notes (Signed)
Patient ID: [name], female   DOB: 18-Dec-1955, 61 y.o.   MRN: [id number] [name]   DOB: 02-Jan-1956  MR Number: [id number]  Location: [facility] [address], [place] [id number] [place] [address] Dept: [phone]  Start:  8 AM End: 9 AM  Provider/Observer:     Edgardo Roys PSYD  Chief Complaint:      Chief Complaint  Patient presents with  . Stress  . Pain    Reason For Service:     I have worked with the patient for several years now. She was initially referred here for difficulty coping with the situation involving her children. Her youngest son is at severe difficulties over the years with regard to mood disorder, substance abuse and behavioral problems. Now there are major stressors associated with him again the patient has been essentially overwhelmed and unsure about what to do about. She reports that this has created a lot of stress both at home and at work. On top of that, her diabetes has gotten to the point that she had hammertoe amputated because of that. She has neuropathy as a result of diabetes as well and has had to stay out of her foot for 8 weeks.  The psychosocial stressors are having significant negative impact upon her severe diabetes with neuropathy and loss of toe.  Interventions Strategy:  Cognitive/behavioral psychotherapeutic interventions  Participation Level:   Active  Participation Quality:  Appropriate      Behavioral Observation:  Well Groomed, Alert, and Depressed.   Current Psychosocial Factors: The patient came in today after not seeing her for several months.  She reports that her youngest son had two other occasions where he had to be rescued with Narcan.  He is now in a long-term substance abuse program and patient feels that he is at least safe from sudden death.    Content of Session:   Review current symptoms and  continued work on building improve coping skills  Current Status:   The patient reports that depression has been a little better with reduced stress but she is having to work more on taking care of self and medical issues.  She is now looking at a spinal cord stimulator due to neuropathic pain.     Last Reviewed:   05/24/2017  Goals Addressed Today:    Goals addressed included building better coping skills.  Impression/Diagnosis:   The patient has a long history of attention deficit disorder but due to Maj. psychosocial stressors she also developed clinical depression and anxiety. I think that her attentional problems were valid prior to the development of these issues that existed her entire life. However, she is in and repeat if and recurrent trauma with regard primarily to her son and to her daughter to a lesser degree in years past.  Diagnosis:    Axis I:  Type 2 diabetes mellitus with diabetic neuropathy, unspecified whether long term insulin use ([place])  Major depressive disorder, recurrent episode, moderate ([place])      Axis II: No diagnosis      Edgardo Roys, PsyD 06/10/2017

## 2017-06-18 ENCOUNTER — Other Ambulatory Visit: Payer: Self-pay | Admitting: Nurse Practitioner

## 2017-06-29 ENCOUNTER — Encounter: Payer: Self-pay | Admitting: Psychology

## 2017-06-29 ENCOUNTER — Encounter: Payer: Medicare Other | Attending: Psychology | Admitting: Psychology

## 2017-06-29 DIAGNOSIS — F411 Generalized anxiety disorder: Secondary | ICD-10-CM

## 2017-06-29 DIAGNOSIS — E1140 Type 2 diabetes mellitus with diabetic neuropathy, unspecified: Secondary | ICD-10-CM | POA: Diagnosis not present

## 2017-06-29 DIAGNOSIS — Z713 Dietary counseling and surveillance: Secondary | ICD-10-CM | POA: Diagnosis present

## 2017-06-29 DIAGNOSIS — F331 Major depressive disorder, recurrent, moderate: Secondary | ICD-10-CM | POA: Diagnosis not present

## 2017-06-29 NOTE — Progress Notes (Signed)
Patient ID: Rhonda Thomas, female   DOB: [birth year], 61 y.o.   MRN: [id number] [name]   DOB: 1956/03/05  MR Number: [id number]  Location: [facility] [address] [place] [id number] [place] [address] Dept: [phone]  Start:  8 AM End: 9 AM  Provider/Observer:     Edgardo Roys PSYD  Chief Complaint:      Chief Complaint  Patient presents with  . Anxiety  . Stress  . Pain    Reason For Service:     I have worked with the patient for several years now. She was initially referred here for difficulty coping with the situation involving her children. Her youngest son is at severe difficulties over the years with regard to mood disorder, substance abuse and behavioral problems. Now there are major stressors associated with him again the patient has been essentially overwhelmed and unsure about what to do about. She reports that this has created a lot of stress both at home and at work. On top of that, her diabetes has gotten to the point that she had hammertoe amputated because of that. She has neuropathy as a result of diabetes as well and has had to stay out of her foot for 8 weeks.  The psychosocial stressors are having significant negative impact upon her severe diabetes with neuropathy and loss of toe.  Interventions Strategy:  Cognitive/behavioral psychotherapeutic interventions  Participation Level:   Active  Participation Quality:  Appropriate      Behavioral Observation:  Well Groomed, Alert, and Depressed.   Current Psychosocial Factors: The patient Reports that her overall stress level has been significantly reduced with the fact that her son has been actively participating in substance abuse treatment. He has been able to get a job and pay for his way for the most part and appears to be doing quite well. This is been a massive relief for her. However,  the now conflict between her son and her husband with her husband very angry about what the son had stolen from them is creating a separate type of stressor.    Content of Session:   Review current symptoms and continued work on building improve coping skills  Current Status:   The patient reports that depression and anxiety have been improved with the decrease stress from her son. However, she reports that she continues to be quite worried about her overall health functioning due to severe diabetes and neuropathy in her feet. She is looking at a spinal cord stimulator. However, she is starting to experience neuropathy symptoms in her hands.     Last Reviewed:   06/29/2017  Goals Addressed Today:    Goals addressed included building better coping skills.  Impression/Diagnosis:   The patient has a long history of attention deficit disorder but due to Maj. psychosocial stressors she also developed clinical depression and anxiety. I think that her attentional problems were valid prior to the development of these issues that existed her entire life. However, she is in and repeat if and recurrent trauma with regard primarily to her son and to her daughter to a lesser degree in years past.  Diagnosis:    Axis I:  Type 2 diabetes mellitus with diabetic neuropathy, unspecified whether long term insulin use ([facility])  Major depressive disorder, recurrent episode, moderate ([facility])  Generalized anxiety disorder      Axis II: No diagnosis  Edgardo Roys, PsyD 06/29/2017

## 2017-07-06 ENCOUNTER — Other Ambulatory Visit: Payer: Self-pay | Admitting: *Deleted

## 2017-07-06 MED ORDER — METFORMIN HCL 500 MG PO TABS: ORAL_TABLET | ORAL | 0 refills | Status: DC

## 2017-07-12 ENCOUNTER — Other Ambulatory Visit: Payer: Self-pay | Admitting: Family

## 2017-07-18 ENCOUNTER — Other Ambulatory Visit: Payer: Self-pay | Admitting: Family

## 2017-07-19 NOTE — Telephone Encounter (Signed)
Last seen 02/25/17  MMM   [facility] PCP

## 2017-07-24 ENCOUNTER — Other Ambulatory Visit: Payer: Self-pay | Admitting: Family

## 2017-07-24 DIAGNOSIS — G2581 Restless legs syndrome: Secondary | ICD-10-CM

## 2017-07-24 DIAGNOSIS — E1142 Type 2 diabetes mellitus with diabetic polyneuropathy: Secondary | ICD-10-CM

## 2017-07-31 ENCOUNTER — Encounter: Payer: Self-pay | Admitting: Psychology

## 2017-07-31 ENCOUNTER — Encounter: Payer: Medicare Other | Attending: Psychology | Admitting: Psychology

## 2017-07-31 DIAGNOSIS — Z713 Dietary counseling and surveillance: Secondary | ICD-10-CM | POA: Insufficient documentation

## 2017-07-31 DIAGNOSIS — E1140 Type 2 diabetes mellitus with diabetic neuropathy, unspecified: Secondary | ICD-10-CM

## 2017-07-31 DIAGNOSIS — F331 Major depressive disorder, recurrent, moderate: Secondary | ICD-10-CM | POA: Diagnosis not present

## 2017-07-31 DIAGNOSIS — F411 Generalized anxiety disorder: Secondary | ICD-10-CM

## 2017-07-31 NOTE — [place] Notes (Signed)
Patient ID: [name], female   DOB: 13-Jul-1956, 61 y.o.   MRN: [id number] [name]   DOB: October 11, 1956  MR Number: [id number]  Location: [facility] [address] [place] [id number] [place] [address] Dept: [phone]  Start: 1 PM End: 2 PM  Provider/Observer:     Edgardo Roys PSYD  Chief Complaint:      Chief Complaint  Patient presents with  . Anxiety  . Pain  . Stress  . Peripheral Neuropathy    Reason For Service:     I have worked with the patient for several years now. She was initially referred here for difficulty coping with the situation involving her children. Her youngest son is at severe difficulties over the years with regard to mood disorder, substance abuse and behavioral problems. Now there are major stressors associated with him again the patient has been essentially overwhelmed and unsure about what to do about. She reports that this has created a lot of stress both at home and at work. On top of that, her diabetes has gotten to the point that she had hammertoe amputated because of that. She has neuropathy as a result of diabetes as well and has had to stay out of her foot for 8 weeks.  The psychosocial stressors are having significant negative impact upon her severe diabetes with neuropathy and loss of toe.  Interventions Strategy:  Cognitive/behavioral psychotherapeutic interventions  Participation Level:   Active  Participation Quality:  Appropriate      Behavioral Observation:  Well Groomed, Alert, and Depressed.   Current Psychosocial Factors: The patient reports that her son is back living with her after 7 day rehab.  He has not gotten into a long-term program yet.  He is starting to get back to his old ways of taking stuff from patient and his father, not doing things that he can for money etc.  This is causing a lot of stress for  the patient.   Content of Session:   Review current symptoms and continued work on building improve coping skills  Current Status:   The patient reports that depression and anxiety have gotten worse, but she is planning to go over to Dr. Maryjean Ka' office to see about spinal cord stimulator.  She was just days away from getting one when last office closed down.     Last Reviewed:   07/31/2017  Goals Addressed Today:    Goals addressed included building better coping skills.  Impression/Diagnosis:   The patient has a long history of attention deficit disorder but due to Maj. psychosocial stressors she also developed clinical depression and anxiety. I think that her attentional problems were valid prior to the development of these issues that existed her entire life. However, she is in and repeat if and recurrent trauma with regard primarily to her son and to her daughter to a lesser degree in years past.  Diagnosis:    Axis I:  Type 2 diabetes mellitus with diabetic neuropathy, unspecified whether long term insulin use ([place])  Major depressive disorder, recurrent episode, moderate ([place])  Generalized anxiety disorder      Axis II: No diagnosis      Edgardo Roys, PsyD 07/31/2017

## 2017-08-01 ENCOUNTER — Other Ambulatory Visit: Payer: Self-pay | Admitting: Family

## 2017-08-25 ENCOUNTER — Other Ambulatory Visit: Payer: Self-pay | Admitting: Family

## 2017-08-25 ENCOUNTER — Encounter: Payer: Self-pay | Admitting: Physician Assistant

## 2017-08-25 ENCOUNTER — Ambulatory Visit (INDEPENDENT_AMBULATORY_CARE_PROVIDER_SITE_OTHER): Payer: Medicare Other | Admitting: Physician Assistant

## 2017-08-25 VITALS — BP 131/76 | HR 95 | Temp 97.4°F | Ht 69.0 in | Wt 192.0 lb

## 2017-08-25 DIAGNOSIS — R202 Paresthesia of skin: Secondary | ICD-10-CM

## 2017-08-25 DIAGNOSIS — E785 Hyperlipidemia, unspecified: Secondary | ICD-10-CM | POA: Diagnosis not present

## 2017-08-25 DIAGNOSIS — R413 Other amnesia: Secondary | ICD-10-CM

## 2017-08-25 DIAGNOSIS — R5382 Chronic fatigue, unspecified: Secondary | ICD-10-CM | POA: Diagnosis not present

## 2017-08-25 DIAGNOSIS — R200 Anesthesia of skin: Secondary | ICD-10-CM

## 2017-08-25 DIAGNOSIS — L659 Nonscarring hair loss, unspecified: Secondary | ICD-10-CM | POA: Diagnosis not present

## 2017-08-25 DIAGNOSIS — L97521 Non-pressure chronic ulcer of other part of left foot limited to breakdown of skin: Secondary | ICD-10-CM | POA: Diagnosis not present

## 2017-08-25 DIAGNOSIS — E1140 Type 2 diabetes mellitus with diabetic neuropathy, unspecified: Secondary | ICD-10-CM

## 2017-08-25 LAB — BAYER DCA HB A1C WAIVED: HB A1C (BAYER DCA - WAIVED): 7 % — ABNORMAL HIGH (ref <7.0)

## 2017-08-25 MED ORDER — OXYCODONE-ACETAMINOPHEN 10-325 MG PO TABS: 1.0000 | ORAL_TABLET | Freq: Three times a day (TID) | ORAL | 0 refills | Status: DC

## 2017-08-25 MED ORDER — DULOXETINE HCL 60 MG PO CPEP: 60.0000 mg | ORAL_CAPSULE | Freq: Every day | ORAL | 5 refills | Status: DC

## 2017-08-25 MED ORDER — PREGABALIN 150 MG PO CAPS: ORAL_CAPSULE | ORAL | 5 refills | Status: DC

## 2017-08-25 NOTE — Patient Instructions (Signed)
In a few days you may receive a survey in the mail or online from [employer] regarding your visit with us today. Please take a moment to fill this out. Your feedback is very important to our whole office. It can help us better understand your needs as well as improve your experience and satisfaction. Thank you for taking your time to complete it. We care about you.  Faylene Allerton, PA-C  

## 2017-08-25 NOTE — [place] Notes (Signed)
BP 131/76   Pulse 95   Temp (!) 97.4 F (36.3 C) (Oral)   Ht _0  (1.753 m)   Wt 192 lb (87.1 kg)   BMI 28.35 kg/m    Subjective:    Patient ID: Rhonda Thomas, female    DOB: [birth year], 61 y.o.   MRN: [id number]  HPI: Rhonda Thomas is a 61 y.o. female presenting on 08/25/2017 for Fatigue; Numbness (bilateral hands); Alopecia; difficulty concentrating ; and Medication Refill  This patient comes in for recheck today. She is also having a significant amount of other problems. She is expressing numbness in her fingertips bilaterally. She has had long-term neuropathy so she is not feeling anything different Bayer. She has had a history of B12 deficiency but is not currently taking any B12 medication. For greater than a month she has had brain fog, fatigue, hair loss and has very little energy to do anything. She is articulate through menopause. In October she will be getting the stimulator placed in her back Dr. Brien Few. She is also followed by Dr. Irving Shows for podiatry and Dr. Christian Mate for endocrinology. She has been upfront with what is going on with her pain medication. The one that she uses seat before his, potassium. She was going to be followed by Dr. Dema Severin. Her son is in the home and he has had a problem with addiction. He is currently clean. Dr. Mallie Mussel was not comfortable prescribing the medication. She is hoping to get off a lot of her medication list stimulator has been placed. The [place] with site is checked and she is taking her medicine appropriately.  Relevant past medical, surgical, family and social history reviewed and updated as indicated. Allergies and medications reviewed and updated.  Past Medical History:  Diagnosis Date  . Anxiety   . Attention deficit disorder (ADD)   . Depression   . Diabetes ([place])   . Hiatal hernia   . Movement disorder   . Neuropathy   . Schatzki's ring   . Sleep apnea     Past Surgical History:  Procedure Laterality Date  . ABDOMINAL  HYSTERECTOMY    . BREAST LUMPECTOMY WITH RADIOACTIVE SEED LOCALIZATION Right 04/25/2016   Procedure: RIGHT BREAST LUMPECTOMY WITH RADIOACTIVE SEED LOCALIZATION;  Surgeon: Autumn Messing III, MD;  Location: [facility];  Service: General;  Laterality: Right;  . CHOLECYSTECTOMY    . RECTOCELE REPAIR    . TOE AMPUTATION Right    2nd toe rt foot  . TUBAL LIGATION      Review of Systems  Constitutional: Positive for activity change, diaphoresis and fatigue. Negative for fever.  HENT: Negative.   Eyes: Negative.   Respiratory: Negative.  Negative for cough, shortness of breath and wheezing.   Cardiovascular: Negative.  Negative for chest pain.  Gastrointestinal: Negative.  Negative for abdominal pain.  Endocrine: Positive for cold intolerance and heat intolerance.  Genitourinary: Negative.  Negative for dysuria.  Musculoskeletal: Positive for myalgias.  Skin: Negative.   Neurological: Positive for numbness.  Hematological: Negative.   Psychiatric/Behavioral: Positive for dysphoric mood.    Allergies as of 08/25/2017      Reactions   Actos [pioglitazone]    Edema   Invokana [canagliflozin]    vaginitis      Medication List       Accurate as of 08/25/17 12:35 PM. Always use your most recent med list.          aspirin EC 81 MG tablet Take 1 tablet (  81 mg total) by mouth daily.   BASAGLAR KWIKPEN 100 UNIT/ML Sopn INJECT 20 UNITS SUB-Q EVERY DAY   carbidopa-levodopa 25-100 MG tablet Commonly known as:  SINEMET IR TAKE ONE TABLET BY MOUTH THREE TIMES DAILY   DULoxetine 60 MG capsule Commonly known as:  CYMBALTA Take 1 capsule (60 mg total) by mouth daily.   fluticasone 50 MCG/ACT nasal spray Commonly known as:  FLONASE Place 1 spray into both nostrils 2 (two) times daily.   glucose blood test strip Commonly known as:  ONETOUCH VERIO Test bid, DX E11.40   metFORMIN 500 MG tablet Commonly known as:  GLUCOPHAGE TAKE TWO TABLETS BY MOUTH EVERY DAY AS DIRECTED    NOVOLOG FLEXPEN 100 UNIT/ML FlexPen Generic drug:  insulin aspart INJECT 3 TO 5 UNITS INTO THE SKIN 3 TIMES DAILY WITH MEALS. THIS IS A 100 DAY SUPPLY   omeprazole 40 MG capsule Commonly known as:  PRILOSEC TAKE 1 CAPSULE BY MOUTH EVERY DAY   onetouch ultrasoft lancets CHECK DAILY AND AS NEEDED   OXcarbazepine 150 MG tablet Commonly known as:  TRILEPTAL TAKE 1 TABLET BY MOUTH IN THE MORNING AND 2 TABLETS AT BEDTIME   oxyCODONE-acetaminophen 10-325 MG tablet Commonly known as:  PERCOCET Take 1 tablet by mouth every 8 (eight) hours.   pregabalin 150 MG capsule Commonly known as:  LYRICA TAKE 1 CAPSULE BY MOUTH THREE TIMES DAILY (COUPON CARD ON FILE BUNAVAIL CARD)   TRULICITY 1.5 IY/6.4BR Sopn Generic drug:  Dulaglutide INJECT 1.5 MG into the skin ONCE WEEKLY   UNIFINE PENTIPS 31G X 6 MM Misc Generic drug:  Insulin Pen Needle   GLOBAL EASE INJECT PEN NEEDLES 31G X 5 MM Misc Generic drug:  Insulin Pen Needle USE FOUR TIMES DAILY            Discharge Care Instructions        Start     Ordered   08/25/17 0000  CBC with Differential/Platelet     08/25/17 1204   08/25/17 0000  CMP14+EGFR     08/25/17 1204   08/25/17 0000  Lipid panel     08/25/17 1204   08/25/17 0000  Thyroid Panel With TSH     08/25/17 1204   08/25/17 0000  Bayer DCA Hb A1c Waived     08/25/17 1204   08/25/17 0000  Vitamin B12     08/25/17 1204   08/25/17 0000  VITAMIN D 25 Hydroxy (Vit-D Deficiency, Fractures)     08/25/17 1204   08/25/17 0000  Iron     08/25/17 1204   08/25/17 0000  Hepatitis B surface antibody     08/25/17 1204   08/25/17 0000  DULoxetine (CYMBALTA) 60 MG capsule  Daily    Question:  Supervising Provider  Answer:  Timmothy Euler   08/25/17 1208   08/25/17 0000  pregabalin (LYRICA) 150 MG capsule    Comments:  09/16/2015 2:33:17 PM10/02/2015 3:59:40 PM  Question:  Supervising Provider  Answer:  Kenn File L   08/25/17 1208   08/25/17 0000   oxyCODONE-acetaminophen (PERCOCET) 10-325 MG tablet  Every 8 hours    Comments:  Fill 09/10/17  Question:  Supervising Provider  Answer:  Kenn File L   08/25/17 1208         Objective:    BP 131/76   Pulse 95   Temp (!) 97.4 F (36.3 C) (Oral)   Ht _0  (1.753 m)   Wt 192 lb (87.1 kg)  BMI 28.35 kg/m   Allergies  Allergen Reactions  . Actos [Pioglitazone]     Edema   . Invokana [Canagliflozin]     vaginitis    Physical Exam  Constitutional: She is oriented to person, place, and time. [name].  HENT:  Head: Normocephalic and atraumatic.  Right Ear: Tympanic membrane, external ear and ear canal normal.  Left Ear: Tympanic membrane, external ear and ear canal normal.  Nose: Nose normal. No rhinorrhea.  Mouth/Throat: Oropharynx is clear and moist and mucous membranes are normal. No oropharyngeal exudate or posterior oropharyngeal erythema.  Eyes: Pupils are equal, round, and reactive to light. Conjunctivae and EOM are normal.  Neck: Normal range of motion. Neck supple.  Cardiovascular: Normal rate, regular rhythm, normal heart sounds and intact distal pulses.   Pulmonary/Chest: Effort normal and breath sounds normal.  Abdominal: Soft. Bowel sounds are normal.  Neurological: She is alert and oriented to person, place, and time. She has normal reflexes.  Skin: Skin is warm and dry. No rash noted. No erythema. No pallor.  Psychiatric: She has a normal mood and affect. [name]. Judgment and thought content normal.        Assessment & Plan:   1. Memory loss - CBC with Differential/Platelet - CMP14+EGFR - Thyroid Panel With TSH - VITAMIN D 25 Hydroxy (Vit-D Deficiency, Fractures) - Iron - Hepatitis B surface antibody  2. Numbness and tingling in both hands - Vitamin B12  3. Hair loss - CBC with Differential/Platelet - CMP14+EGFR - Thyroid Panel With TSH - VITAMIN D 25 Hydroxy (Vit-D Deficiency,  Fractures) - Iron - Hepatitis B surface antibody  4. Chronic fatigue - VITAMIN D 25 Hydroxy (Vit-D Deficiency, Fractures) - Iron - Hepatitis B surface antibody  5. Type 2 diabetes mellitus with diabetic neuropathy, unspecified whether long term insulin use ([facility]) - Bayer DCA Hb A1c Waived - pregabalin (LYRICA) 150 MG capsule; TAKE 1 CAPSULE BY MOUTH THREE TIMES DAILY (COUPON CARD ON FILE BUNAVAIL CARD)  Dispense: 90 capsule; Refill: 5  6. Hyperlipidemia, unspecified hyperlipidemia type - Lipid panel  7. Skin ulcer of left foot, limited to breakdown of skin ([facility]) - DULoxetine (CYMBALTA) 60 MG capsule; Take 1 capsule (60 mg total) by mouth daily.  Dispense: 30 capsule; Refill: 5    Current Outpatient Prescriptions:  .  aspirin EC 81 MG tablet, Take 1 tablet (81 mg total) by mouth daily., Disp: 90 tablet, Rfl: 1 .  carbidopa-levodopa (SINEMET IR) 25-100 MG tablet, TAKE ONE TABLET BY MOUTH THREE TIMES DAILY, Disp: 90 tablet, Rfl: 2 .  DULoxetine (CYMBALTA) 60 MG capsule, Take 1 capsule (60 mg total) by mouth daily., Disp: 30 capsule, Rfl: 5 .  fluticasone (FLONASE) 50 MCG/ACT nasal spray, Place 1 spray into both nostrils 2 (two) times daily., Disp: 16 g, Rfl: 6 .  GLOBAL EASE INJECT PEN NEEDLES 31G X 5 MM MISC, USE FOUR TIMES DAILY, Disp: 100 each, Rfl: 3 .  glucose blood (ONETOUCH VERIO) test strip, Test bid, DX E11.40, Disp: 100 each, Rfl: 3 .  Insulin Glargine (BASAGLAR KWIKPEN) 100 UNIT/ML SOPN, INJECT 20 UNITS SUB-Q EVERY DAY, Disp: 15 mL, Rfl: 1 .  Lancets (ONETOUCH ULTRASOFT) lancets, CHECK DAILY AND AS NEEDED, Disp: 100 each, Rfl: 3 .  metFORMIN (GLUCOPHAGE) 500 MG tablet, TAKE TWO TABLETS BY MOUTH EVERY DAY AS DIRECTED, Disp: 60 tablet, Rfl: 0 .  NOVOLOG FLEXPEN 100 UNIT/ML FlexPen, INJECT 3 TO 5 UNITS INTO THE SKIN 3 TIMES  DAILY WITH MEALS. THIS IS A 100 DAY SUPPLY, Disp: 15 mL, Rfl: 2 .  omeprazole (PRILOSEC) 40 MG capsule, TAKE 1 CAPSULE BY MOUTH EVERY DAY, Disp: 30 capsule,  Rfl: 2 .  OXcarbazepine (TRILEPTAL) 150 MG tablet, TAKE 1 TABLET BY MOUTH IN THE MORNING AND 2 TABLETS AT BEDTIME, Disp: 90 tablet, Rfl: 2 .  oxyCODONE-acetaminophen (PERCOCET) 10-325 MG tablet, Take 1 tablet by mouth every 8 (eight) hours., Disp: 90 tablet, Rfl: 0 .  pregabalin (LYRICA) 150 MG capsule, TAKE 1 CAPSULE BY MOUTH THREE TIMES DAILY (COUPON CARD ON FILE BUNAVAIL CARD), Disp: 90 capsule, Rfl: 5 .  TRULICITY 1.5 RV/4.4QP SOPN, INJECT 1.5 MG into the skin ONCE WEEKLY, Disp: 2 mL, Rfl: 2 .  UNIFINE PENTIPS 31G X 6 MM MISC, , Disp: , Rfl:  Continue all other maintenance medications as listed above.  Follow up plan: Return in about 3 months (around 11/24/2017).  Educational handout given for [place] PA-C [facility] [address]  [place], [place] [address] [phone]   08/25/2017, 12:35 PM

## 2017-08-26 ENCOUNTER — Other Ambulatory Visit: Payer: Self-pay | Admitting: Family

## 2017-08-26 ENCOUNTER — Other Ambulatory Visit: Payer: Self-pay | Admitting: Physician Assistant

## 2017-08-26 DIAGNOSIS — Z205 Contact with and (suspected) exposure to viral hepatitis: Secondary | ICD-10-CM

## 2017-08-26 NOTE — [place] Notes (Signed)
Add-on form completed

## 2017-08-28 ENCOUNTER — Other Ambulatory Visit: Payer: Self-pay | Admitting: *Deleted

## 2017-08-28 DIAGNOSIS — E538 Deficiency of other specified B group vitamins: Secondary | ICD-10-CM

## 2017-08-28 MED ORDER — CYANOCOBALAMIN 1000 MCG/ML IJ SOLN
1000.0000 ug | INTRAMUSCULAR | Status: AC
  Administered 2017-08-29 – 2019-07-22 (×13): 1000 ug via INTRAMUSCULAR

## 2017-08-29 ENCOUNTER — Ambulatory Visit (INDEPENDENT_AMBULATORY_CARE_PROVIDER_SITE_OTHER): Payer: Medicare Other | Admitting: *Deleted

## 2017-08-29 DIAGNOSIS — E538 Deficiency of other specified B group vitamins: Secondary | ICD-10-CM

## 2017-08-29 LAB — SPECIMEN STATUS REPORT

## 2017-08-29 LAB — HEPATITIS B CORE AB W/REFLEX: Hep B Core Total Ab: NEGATIVE

## 2017-08-29 NOTE — Progress Notes (Signed)
Cyanocobalamin inj given Pt tolerated well

## 2017-08-30 LAB — CMP14+EGFR
ALT: 5 IU/L (ref 0–32)
AST: 9 IU/L (ref 0–40)
Albumin/Globulin Ratio: 1.6 (ref 1.2–2.2)
Albumin: 4.4 g/dL (ref 3.6–4.8)
Alkaline Phosphatase: 87 IU/L (ref 39–117)
BUN/Creatinine Ratio: 22 (ref 12–28)
BUN: 17 mg/dL (ref 8–27)
Bilirubin Total: <0.2 mg/dL (ref 0.0–1.2)
CO2: 25 mmol/L (ref 20–29)
Calcium: 9.4 mg/dL (ref 8.7–10.3)
Chloride: 96 mmol/L (ref 96–106)
Creatinine, Ser: 0.76 mg/dL (ref 0.57–1.00)
GFR calc Af Amer: 98 mL/min/{1.73_m2} (ref >59)
GFR calc non Af Amer: 85 mL/min/{1.73_m2} (ref >59)
Globulin, Total: 2.7 g/dL (ref 1.5–4.5)
Glucose: 165 mg/dL — ABNORMAL HIGH (ref 65–99)
Potassium: 4.7 mmol/L (ref 3.5–5.2)
Sodium: 138 mmol/L (ref 134–144)
Total Protein: 7.1 g/dL (ref 6.0–8.5)

## 2017-08-30 LAB — LIPID PANEL
Chol/HDL Ratio: 4.1 ratio (ref 0.0–4.4)
Cholesterol, Total: 203 mg/dL — ABNORMAL HIGH (ref 100–199)
HDL: 50 mg/dL (ref >39)
LDL Calculated: 89 mg/dL (ref 0–99)
Triglycerides: 318 mg/dL — ABNORMAL HIGH (ref 0–149)
VLDL Cholesterol Cal: 64 mg/dL — ABNORMAL HIGH (ref 5–40)

## 2017-08-30 LAB — CBC WITH DIFFERENTIAL/PLATELET
Basophils Absolute: 0 10*3/uL (ref 0.0–0.2)
Basos: 0 %
EOS (ABSOLUTE): 0.3 10*3/uL (ref 0.0–0.4)
Eos: 3 %
Hematocrit: 41.2 % (ref 34.0–46.6)
Hemoglobin: 13.2 g/dL (ref 11.1–15.9)
Immature Grans (Abs): 0 10*3/uL (ref 0.0–0.1)
Immature Granulocytes: 0 %
Lymphocytes Absolute: 2.4 10*3/uL (ref 0.7–3.1)
Lymphs: 30 %
MCH: 28.9 pg (ref 26.6–33.0)
MCHC: 32 g/dL (ref 31.5–35.7)
MCV: 90 fL (ref 79–97)
Monocytes Absolute: 0.6 10*3/uL (ref 0.1–0.9)
Monocytes: 7 %
Neutrophils Absolute: 4.9 10*3/uL (ref 1.4–7.0)
Neutrophils: 60 %
Platelets: 299 10*3/uL (ref 150–379)
RBC: 4.57 x10E6/uL (ref 3.77–5.28)
RDW: 14.2 % (ref 12.3–15.4)
WBC: 8.1 10*3/uL (ref 3.4–10.8)

## 2017-08-30 LAB — IRON: Iron: 47 ug/dL (ref 27–139)

## 2017-08-30 LAB — THYROID PANEL WITH TSH
Free Thyroxine Index: 1.4 (ref 1.2–4.9)
T3 Uptake Ratio: 23 % — ABNORMAL LOW (ref 24–39)
T4, Total: 6.1 ug/dL (ref 4.5–12.0)
TSH: 2.01 u[IU]/mL (ref 0.450–4.500)

## 2017-08-30 LAB — HEPATITIS B SURFACE ANTIBODY,QUALITATIVE: Hep B Surface Ab, Qual: REACTIVE

## 2017-08-30 LAB — VITAMIN D 25 HYDROXY (VIT D DEFICIENCY, FRACTURES): Vit D, 25-Hydroxy: 27.2 ng/mL — ABNORMAL LOW (ref 30.0–100.0)

## 2017-08-30 LAB — VITAMIN B12: Vitamin B-12: 213 pg/mL — ABNORMAL LOW (ref 232–1245)

## 2017-09-04 ENCOUNTER — Encounter: Payer: Medicare Other | Attending: Psychology | Admitting: Psychology

## 2017-09-04 DIAGNOSIS — F331 Major depressive disorder, recurrent, moderate: Secondary | ICD-10-CM

## 2017-09-04 DIAGNOSIS — Z713 Dietary counseling and surveillance: Secondary | ICD-10-CM | POA: Diagnosis not present

## 2017-09-04 DIAGNOSIS — F411 Generalized anxiety disorder: Secondary | ICD-10-CM | POA: Diagnosis not present

## 2017-09-04 DIAGNOSIS — E1140 Type 2 diabetes mellitus with diabetic neuropathy, unspecified: Secondary | ICD-10-CM | POA: Insufficient documentation

## 2017-09-04 NOTE — Progress Notes (Signed)
Patient ID: Rhonda Thomas, female   DOB: 07-07-1956, 61 y.o.   MRN: [id number] [name]   DOB: [birth year]  MR Number: [id number]  Location: [facility] [address], [place] [id number] [place] [address] Dept: [phone]  Start: 1 PM End: 2 PM  Provider/Observer:     Edgardo Roys PSYD  Chief Complaint:      Chief Complaint  Patient presents with  . Depression  . Anxiety  . Stress  . Pain    Reason For Service:     I have worked with the patient for several years now. She was initially referred here for difficulty coping with the situation involving her children. Her youngest son is at severe difficulties over the years with regard to mood disorder, substance abuse and behavioral problems. Now there are major stressors associated with him again the patient has been essentially overwhelmed and unsure about what to do about. She reports that this has created a lot of stress both at home and at work. On top of that, her diabetes has gotten to the point that she had hammertoe amputated because of that. She has neuropathy as a result of diabetes as well and has had to stay out of her foot for 8 weeks.  The psychosocial stressors are having significant negative impact upon her severe diabetes with neuropathy and loss of toe.  Interventions Strategy:  Cognitive/behavioral psychotherapeutic interventions  Participation Level:   Active  Participation Quality:  Appropriate      Behavioral Observation:  Well Groomed, Alert, and Depressed.   Current Psychosocial Factors: The patient reports that Things have been quieter with her son although she is not 202% certain that he is not using it all there've not been the major issues that it been going before. The patient reports that she was able to get in to see Dr. Brien Few about a spinal cord stimulator trial and he has agreed  to do that.   Content of Session:   Review current symptoms and continued work on building improve coping skills  Current Status:   The patient reports that depression and anxiety have gotten worse, but she is planning to go over to Dr. Maryjean Ka' office to see about spinal cord stimulator.  She was just days away from getting one when last office closed down.     Last Reviewed:   09/04/2017  Goals Addressed Today:    Goals addressed included building better coping skills.  Impression/Diagnosis:   The patient has a long history of attention deficit disorder but due to Maj. psychosocial stressors she also developed clinical depression and anxiety. I think that her attentional problems were valid prior to the development of these issues that existed her entire life. However, she is in and repeat if and recurrent trauma with regard primarily to her son and to her daughter to a lesser degree in years past.  Diagnosis:    Axis I:  Type 2 diabetes mellitus with diabetic neuropathy, unspecified whether long term insulin use ([place])  Major depressive disorder, recurrent episode, moderate ([place])  Generalized anxiety disorder      Axis II: No diagnosis      Edgardo Roys, PsyD 09/04/2017

## 2017-09-13 ENCOUNTER — Other Ambulatory Visit: Payer: Self-pay | Admitting: Family Medicine

## 2017-09-29 ENCOUNTER — Ambulatory Visit (INDEPENDENT_AMBULATORY_CARE_PROVIDER_SITE_OTHER): Payer: Medicare Other | Admitting: *Deleted

## 2017-09-29 DIAGNOSIS — Z23 Encounter for immunization: Secondary | ICD-10-CM | POA: Diagnosis not present

## 2017-09-29 DIAGNOSIS — E538 Deficiency of other specified B group vitamins: Secondary | ICD-10-CM

## 2017-09-29 NOTE — [place] Notes (Signed)
Pt given Cyanocobalamin inj and flu vaccine Tolerated well

## 2017-10-02 ENCOUNTER — Encounter: Payer: Self-pay | Admitting: Physician Assistant

## 2017-10-02 ENCOUNTER — Other Ambulatory Visit: Payer: Self-pay | Admitting: Family

## 2017-10-02 ENCOUNTER — Ambulatory Visit (INDEPENDENT_AMBULATORY_CARE_PROVIDER_SITE_OTHER): Payer: Medicare Other | Admitting: Physician Assistant

## 2017-10-02 VITALS — BP 132/74 | HR 99 | Temp 97.2°F | Ht 69.0 in | Wt 200.0 lb

## 2017-10-02 DIAGNOSIS — E785 Hyperlipidemia, unspecified: Secondary | ICD-10-CM

## 2017-10-02 DIAGNOSIS — M5137 Other intervertebral disc degeneration, lumbosacral region: Secondary | ICD-10-CM | POA: Diagnosis not present

## 2017-10-02 DIAGNOSIS — M51379 Other intervertebral disc degeneration, lumbosacral region without mention of lumbar back pain or lower extremity pain: Secondary | ICD-10-CM | POA: Insufficient documentation

## 2017-10-02 DIAGNOSIS — E1140 Type 2 diabetes mellitus with diabetic neuropathy, unspecified: Secondary | ICD-10-CM | POA: Diagnosis not present

## 2017-10-02 MED ORDER — FLUCONAZOLE 150 MG PO TABS
ORAL_TABLET | ORAL | 2 refills | Status: DC
Start: 2017-10-02 — End: 2018-02-05

## 2017-10-02 MED ORDER — INSULIN GLARGINE 100 UNIT/ML ~~LOC~~ SOLN: 20.0000 [IU] | Freq: Every day | SUBCUTANEOUS | 3 refills | Status: DC

## 2017-10-02 MED ORDER — OXYCODONE-ACETAMINOPHEN 10-325 MG PO TABS: 1.0000 | ORAL_TABLET | Freq: Three times a day (TID) | ORAL | 0 refills | Status: DC

## 2017-10-02 MED ORDER — OXYCODONE-ACETAMINOPHEN 10-325 MG PO TABS: 1.0000 | ORAL_TABLET | Freq: Three times a day (TID) | ORAL | 0 refills | Status: DC | PRN

## 2017-10-02 NOTE — Progress Notes (Signed)
BP 132/74   Pulse 99   Temp (!) 97.2 F (36.2 C) (Oral)   Ht 5' 9"  (1.753 m)   Wt 200 lb (90.7 kg)   BMI 29.53 kg/m    Subjective:    Patient ID: Rhonda Thomas, female    DOB: 10-05-1956, 61 y.o.   MRN: [id number]  HPI: Rhonda Thomas is a 61 y.o. female presenting on 10/02/2017 for discuss medications  Patient comes in with multiple medical issues.  She is having a stimulator placed hopefully later this month if her insurance approves it.  She had good results in the trial run of the stimulator.  The plan is to reduce the pain medicine after she finishes the surgery.  We have refilled her Percocet 10/325, 1 3 times daily.  We also discussed how we would titrate her down.  She is also needing a change in her basal car.  Her insurance is not paying for that this year.  We will change back to Lantus.  The pharmacy will let us know if her prior health is needed.  Due to the antibiotic she is taking from the podiatrist for her ulcer she is having significant vaginal yeast and would like to have a prescription of Diflucan.  Relevant past medical, surgical, family and social history reviewed and updated as indicated. Allergies and medications reviewed and updated.  Past Medical History:  Diagnosis Date  . Anxiety   . Attention deficit disorder (ADD)   . Depression   . Diabetes ([place])   . Hiatal hernia   . Movement disorder   . Neuropathy   . Schatzki's ring   . Sleep apnea     Past Surgical History:  Procedure Laterality Date  . ABDOMINAL HYSTERECTOMY    . BREAST LUMPECTOMY WITH RADIOACTIVE SEED LOCALIZATION Right 04/25/2016   Procedure: RIGHT BREAST LUMPECTOMY WITH RADIOACTIVE SEED LOCALIZATION;  Surgeon: Autumn Messing III, MD;  Location: [facility];  Service: General;  Laterality: Right;  . CHOLECYSTECTOMY    . RECTOCELE REPAIR    . TOE AMPUTATION Right    2nd toe rt foot  . TUBAL LIGATION      Review of Systems  Constitutional: Negative.  Negative for activity change,  fatigue and fever.  HENT: Negative.   Eyes: Negative.   Respiratory: Negative.  Negative for cough.   Cardiovascular: Negative.  Negative for chest pain.  Gastrointestinal: Negative.  Negative for abdominal pain.  Endocrine: Negative.   Genitourinary: Negative.  Negative for dysuria.  Musculoskeletal: Positive for arthralgias, back pain, gait problem and myalgias.  Skin: Negative.     Allergies as of 10/02/2017      Reactions   Actos [pioglitazone]    Edema   Invokana [canagliflozin]    vaginitis      Medication List       Accurate as of 10/02/17 10:48 AM. Always use your most recent med list.          aspirin EC 81 MG tablet Take 1 tablet (81 mg total) by mouth daily.   carbidopa-levodopa 25-100 MG tablet Commonly known as:  SINEMET IR TAKE ONE TABLET BY MOUTH THREE TIMES DAILY   cyanocobalamin 1000 MCG/ML injection Commonly known as:  (VITAMIN B-12) Inject into the muscle.   doxycycline 100 MG tablet Commonly known as:  VIBRA-TABS Take by mouth.   DULoxetine 60 MG capsule Commonly known as:  CYMBALTA Take 1 capsule (60 mg total) by mouth daily.   fluconazole 150 MG tablet Commonly known  as:  DIFLUCAN TAKE ONE TABLET BY MOUTH ONCE AS DIRECTED   fluconazole 150 MG tablet Commonly known as:  DIFLUCAN 1 po q week x 4 weeks   glucose blood test strip Commonly known as:  ONETOUCH VERIO Test bid, DX E11.40   insulin glargine 100 UNIT/ML injection Commonly known as:  LANTUS Inject 0.2 mLs (20 Units total) into the skin at bedtime.   metFORMIN 500 MG tablet Commonly known as:  GLUCOPHAGE TAKE TWO TABLETS BY MOUTH EVERY DAY AS DIRECTED   NOVOLOG FLEXPEN 100 UNIT/ML FlexPen Generic drug:  insulin aspart INJECT 3 TO 5 UNITS INTO THE SKIN 3 TIMES DAILY WITH MEALS. THIS IS A 100 DAY SUPPLY   onetouch ultrasoft lancets CHECK DAILY AND AS NEEDED   OXcarbazepine 150 MG tablet Commonly known as:  TRILEPTAL TAKE 1 TABLET BY MOUTH IN THE MORNING AND 2 TABLETS  AT BEDTIME   oxyCODONE-acetaminophen 10-325 MG tablet Commonly known as:  PERCOCET Take 1 tablet by mouth every 8 (eight) hours.   oxyCODONE-acetaminophen 10-325 MG tablet Commonly known as:  PERCOCET Take 1 tablet by mouth every 8 (eight) hours as needed for pain.   pregabalin 150 MG capsule Commonly known as:  LYRICA TAKE 1 CAPSULE BY MOUTH THREE TIMES DAILY (COUPON CARD ON FILE BUNAVAIL CARD)   TRULICITY 1.5 RC/7.8LF Sopn Generic drug:  Dulaglutide INJECT 1.5MG INTO THE SKIN ONCE WEEKLY   UNIFINE PENTIPS 31G X 6 MM Misc Generic drug:  Insulin Pen Needle   GLOBAL EASE INJECT PEN NEEDLES 31G X 5 MM Misc Generic drug:  Insulin Pen Needle USE FOUR TIMES DAILY          Objective:    BP 132/74   Pulse 99   Temp (!) 97.2 F (36.2 C) (Oral)   Ht 5' 9"  (1.753 m)   Wt 200 lb (90.7 kg)   BMI 29.53 kg/m   Allergies  Allergen Reactions  . Actos [Pioglitazone]     Edema   . Invokana [Canagliflozin]     vaginitis    Physical Exam  Constitutional: She is oriented to person, place, and time. [name].  HENT:  Head: Normocephalic and atraumatic.  Eyes: Pupils are equal, round, and reactive to light. Conjunctivae and EOM are normal.  Cardiovascular: Normal rate, regular rhythm, normal heart sounds and intact distal pulses.   Pulmonary/Chest: Effort normal and breath sounds normal.  Abdominal: Soft. Bowel sounds are normal.  Neurological: She is alert and oriented to person, place, and time. She has normal reflexes.  Skin: Skin is warm and dry. No rash noted.  Psychiatric: She has a normal mood and affect. [name]. Judgment and thought content normal.  Nursing note and vitals reviewed.   Results for orders placed or performed in visit on 08/25/17  CBC with Differential/Platelet  Result Value Ref Range   WBC 8.1 3.4 - 10.8 x10E3/uL   RBC 4.57 3.77 - 5.28 x10E6/uL   Hemoglobin 13.2 11.1 - 15.9 g/dL   Hematocrit 41.2  34.0 - 46.6 %   MCV 90 79 - 97 fL   MCH 28.9 26.6 - 33.0 pg   MCHC 32.0 31.5 - 35.7 g/dL   RDW 14.2 12.3 - 15.4 %   Platelets 299 150 - 379 x10E3/uL   Neutrophils 60 Not Estab. %   Lymphs 30 Not Estab. %   Monocytes 7 Not Estab. %   Eos 3 Not Estab. %   Basos 0 Not Estab. %  Neutrophils Absolute 4.9 1.4 - 7.0 x10E3/uL   Lymphocytes Absolute 2.4 0.7 - 3.1 x10E3/uL   Monocytes Absolute 0.6 0.1 - 0.9 x10E3/uL   EOS (ABSOLUTE) 0.3 0.0 - 0.4 x10E3/uL   Basophils Absolute 0.0 0.0 - 0.2 x10E3/uL   Immature Granulocytes 0 Not Estab. %   Immature Grans (Abs) 0.0 0.0 - 0.1 x10E3/uL  CMP14+EGFR  Result Value Ref Range   Glucose 165 (H) 65 - 99 mg/dL   BUN 17 8 - 27 mg/dL   Creatinine, Ser 0.76 0.57 - 1.00 mg/dL   GFR calc non Af Amer 85 >59 mL/min/1.73   GFR calc Af Amer 98 >59 mL/min/1.73   BUN/Creatinine Ratio 22 12 - 28   Sodium 138 134 - 144 mmol/L   Potassium 4.7 3.5 - 5.2 mmol/L   Chloride 96 96 - 106 mmol/L   CO2 25 20 - 29 mmol/L   Calcium 9.4 8.7 - 10.3 mg/dL   Total Protein 7.1 6.0 - 8.5 g/dL   Albumin 4.4 3.6 - 4.8 g/dL   Globulin, Total 2.7 1.5 - 4.5 g/dL   Albumin/Globulin Ratio 1.6 1.2 - 2.2   Bilirubin Total <0.2 0.0 - 1.2 mg/dL   Alkaline Phosphatase 87 39 - 117 IU/L   AST 9 0 - 40 IU/L   ALT 5 0 - 32 IU/L  Lipid panel  Result Value Ref Range   Cholesterol, Total 203 (H) 100 - 199 mg/dL   Triglycerides 318 (H) 0 - 149 mg/dL   HDL 50 >39 mg/dL   VLDL Cholesterol Cal 64 (H) 5 - 40 mg/dL   LDL Calculated 89 0 - 99 mg/dL   Chol/HDL Ratio 4.1 0.0 - 4.4 ratio  Thyroid Panel With TSH  Result Value Ref Range   TSH 2.010 0.450 - 4.500 uIU/mL   T4, Total 6.1 4.5 - 12.0 ug/dL   T3 Uptake Ratio 23 (L) 24 - 39 %   Free Thyroxine Index 1.4 1.2 - 4.9  Bayer DCA Hb A1c Waived  Result Value Ref Range   Bayer DCA Hb A1c Waived 7.0 (H) <7.0 %  Vitamin B12  Result Value Ref Range   Vitamin B-12 213 (L) 232 - 1,245 pg/mL  VITAMIN D 25 Hydroxy (Vit-D Deficiency, Fractures)    Result Value Ref Range   Vit D, 25-Hydroxy 27.2 (L) 30.0 - 100.0 ng/mL  Iron  Result Value Ref Range   Iron 47 27 - 139 ug/dL  Hepatitis B surface antibody  Result Value Ref Range   Hep B Surface Ab, Qual Reactive   Hep B Core Ab W/Reflex  Result Value Ref Range   Hep B Core Total Ab Negative Negative  Specimen status report  Result Value Ref Range   specimen status report Comment       Assessment & Plan:   1. Type 2 diabetes mellitus with diabetic neuropathy, unspecified whether long term insulin use (HCC) - insulin glargine (LANTUS) 100 UNIT/ML injection; Inject 0.2 mLs (20 Units total) into the skin at bedtime.  Dispense: 45 mL; Refill: 3  2. Hyperlipidemia, unspecified hyperlipidemia type  3. DDD (degenerative disc disease), lumbosacral - oxyCODONE-acetaminophen (PERCOCET) 10-325 MG tablet; Take 1 tablet by mouth every 8 (eight) hours.  Dispense: 90 tablet; Refill: 0 - oxyCODONE-acetaminophen (PERCOCET) 10-325 MG tablet; Take 1 tablet by mouth every 8 (eight) hours as needed for pain.  Dispense: 90 tablet; Refill: 0    Current Outpatient Prescriptions:  .  aspirin EC 81 MG tablet, Take 1 tablet (81  mg total) by mouth daily., Disp: 90 tablet, Rfl: 1 .  carbidopa-levodopa (SINEMET IR) 25-100 MG tablet, TAKE ONE TABLET BY MOUTH THREE TIMES DAILY, Disp: 90 tablet, Rfl: 2 .  cyanocobalamin (,VITAMIN B-12,) 1000 MCG/ML injection, Inject into the muscle., Disp: , Rfl:  .  doxycycline (VIBRA-TABS) 100 MG tablet, Take by mouth., Disp: , Rfl:  .  DULoxetine (CYMBALTA) 60 MG capsule, Take 1 capsule (60 mg total) by mouth daily., Disp: 30 capsule, Rfl: 5 .  GLOBAL EASE INJECT PEN NEEDLES 31G X 5 MM MISC, USE FOUR TIMES DAILY, Disp: 100 each, Rfl: 3 .  glucose blood (ONETOUCH VERIO) test strip, Test bid, DX E11.40, Disp: 100 each, Rfl: 3 .  Lancets (ONETOUCH ULTRASOFT) lancets, CHECK DAILY AND AS NEEDED, Disp: 100 each, Rfl: 3 .  metFORMIN (GLUCOPHAGE) 500 MG tablet, TAKE TWO TABLETS  BY MOUTH EVERY DAY AS DIRECTED, Disp: 60 tablet, Rfl: 2 .  NOVOLOG FLEXPEN 100 UNIT/ML FlexPen, INJECT 3 TO 5 UNITS INTO THE SKIN 3 TIMES DAILY WITH MEALS. THIS IS A 100 DAY SUPPLY, Disp: 15 mL, Rfl: 2 .  OXcarbazepine (TRILEPTAL) 150 MG tablet, TAKE 1 TABLET BY MOUTH IN THE MORNING AND 2 TABLETS AT BEDTIME, Disp: 90 tablet, Rfl: 2 .  oxyCODONE-acetaminophen (PERCOCET) 10-325 MG tablet, Take 1 tablet by mouth every 8 (eight) hours., Disp: 90 tablet, Rfl: 0 .  pregabalin (LYRICA) 150 MG capsule, TAKE 1 CAPSULE BY MOUTH THREE TIMES DAILY (COUPON CARD ON FILE BUNAVAIL CARD), Disp: 90 capsule, Rfl: 5 .  TRULICITY 1.5 SJ/6.2EZ SOPN, INJECT 1.5MG INTO THE SKIN ONCE WEEKLY, Disp: 2 mL, Rfl: 2 .  UNIFINE PENTIPS 31G X 6 MM MISC, , Disp: , Rfl:  .  fluconazole (DIFLUCAN) 150 MG tablet, TAKE ONE TABLET BY MOUTH ONCE AS DIRECTED (Patient not taking: Reported on 10/02/2017), Disp: 2 tablet, Rfl: 2 .  fluconazole (DIFLUCAN) 150 MG tablet, 1 po q week x 4 weeks, Disp: 4 tablet, Rfl: 2 .  insulin glargine (LANTUS) 100 UNIT/ML injection, Inject 0.2 mLs (20 Units total) into the skin at bedtime., Disp: 45 mL, Rfl: 3 .  oxyCODONE-acetaminophen (PERCOCET) 10-325 MG tablet, Take 1 tablet by mouth every 8 (eight) hours as needed for pain., Disp: 90 tablet, Rfl: 0  Current Facility-Administered Medications:  .  cyanocobalamin ((VITAMIN B-12)) injection 1,000 mcg, 1,000 mcg, Intramuscular, Q30 days, Chancy Smigiel S, PA-C, 1,000 mcg at 09/29/17 1140 Continue all other maintenance medications as listed above.  Follow up plan: Return in about 2 months (around 12/02/2017) for recheck.  Educational handout given for [place] PA-C [place] [address]  [place], [place] [address] [phone]   10/02/2017, 10:48 AM

## 2017-10-02 NOTE — Patient Instructions (Signed)
In a few days you may receive a survey in the mail or online from [employer] regarding your visit with us today. Please take a moment to fill this out. Your feedback is very important to our whole office. It can help us better understand your needs as well as improve your experience and satisfaction. Thank you for taking your time to complete it. We care about you.  Elder Davidian, PA-C  

## 2017-10-04 ENCOUNTER — Telehealth: Payer: Self-pay | Admitting: Physician Assistant

## 2017-10-04 MED ORDER — INSULIN LISPRO 100 UNIT/ML (KWIKPEN)
5.0000 [IU] | PEN_INJECTOR | Freq: Three times a day (TID) | SUBCUTANEOUS | 11 refills | Status: DC
Start: 2017-10-04 — End: 2017-12-26

## 2017-10-04 NOTE — Telephone Encounter (Signed)
Rx sent per [facility] message left for patient.

## 2017-10-18 ENCOUNTER — Other Ambulatory Visit: Payer: Self-pay | Admitting: Family

## 2017-10-18 DIAGNOSIS — E1142 Type 2 diabetes mellitus with diabetic polyneuropathy: Secondary | ICD-10-CM

## 2017-10-18 DIAGNOSIS — G2581 Restless legs syndrome: Secondary | ICD-10-CM

## 2017-10-26 ENCOUNTER — Encounter: Payer: Self-pay | Admitting: Psychology

## 2017-10-26 ENCOUNTER — Encounter: Payer: Medicare Other | Attending: Psychology | Admitting: Psychology

## 2017-10-26 DIAGNOSIS — F411 Generalized anxiety disorder: Secondary | ICD-10-CM | POA: Diagnosis not present

## 2017-10-26 DIAGNOSIS — E1140 Type 2 diabetes mellitus with diabetic neuropathy, unspecified: Secondary | ICD-10-CM | POA: Diagnosis not present

## 2017-10-26 DIAGNOSIS — Z713 Dietary counseling and surveillance: Secondary | ICD-10-CM | POA: Diagnosis not present

## 2017-10-26 DIAGNOSIS — F331 Major depressive disorder, recurrent, moderate: Secondary | ICD-10-CM | POA: Diagnosis not present

## 2017-10-26 NOTE — Progress Notes (Signed)
Patient ID: Rhonda Thomas, female   DOB: Aug 02, 1956, 61 y.o.   MRN: [id number] [name]   DOB: 11-09-1956  MR Number: [id number]  Location: [facility] [address], [place] [id number] [place] [address] Dept: [phone]  Start: 1 PM End: 2 PM  Provider/Observer:     Edgardo Roys PSYD  Chief Complaint:      Chief Complaint  Patient presents with  . Pain  . Agitation  . Anxiety  . Stress    Reason For Service:     I have worked with the patient for several years now. She was initially referred here for difficulty coping with the situation involving her children. Her youngest son is at severe difficulties over the years with regard to mood disorder, substance abuse and behavioral problems. Now there are major stressors associated with him again the patient has been essentially overwhelmed and unsure about what to do about. She reports that this has created a lot of stress both at home and at work. On top of that, her diabetes has gotten to the point that she had hammertoe amputated because of that. She has neuropathy as a result of diabetes as well and has had to stay out of her foot for 8 weeks.  The psychosocial stressors are having significant negative impact upon her severe diabetes with neuropathy and loss of toe.  Interventions Strategy:  Cognitive/behavioral psychotherapeutic interventions  Participation Level:   Active  Participation Quality:  Appropriate      Behavioral Observation:  Well Groomed, Alert, and Depressed.   Current Psychosocial Factors: The patient reports that she has had further medical workups on her feet and has been trying to improve an infection on her toe so she could have another surgery on her foot.  The patient reports that she is struggling with wearing 2 boots.  The patient reports that her son moved out of the house is returned  but has been able to maintain a job that is causing less stress but continues to be problematic.  Content of Session:   Review current symptoms and continued work on building improve coping skills  Current Status:   The patient reports that she has been able to finish her spinal cord stimulator surgery and reports that she is experiencing a 50-60% improvement in her pain levels.     Last Reviewed:   10/26/2017  Goals Addressed Today:    Goals addressed included building better coping skills.  Impression/Diagnosis:   The patient has a long history of attention deficit disorder but due to Maj. psychosocial stressors she also developed clinical depression and anxiety. I think that her attentional problems were valid prior to the development of these issues that existed her entire life. However, she is in and repeat if and recurrent trauma with regard primarily to her son and to her daughter to a lesser degree in years past.  Diagnosis:    Axis I:  Major depressive disorder, recurrent episode, moderate ([place])  Type 2 diabetes mellitus with diabetic neuropathy, unspecified whether long term insulin use ([place])  Generalized anxiety disorder      Axis II: No diagnosis      Edgardo Roys, PsyD 10/26/2017

## 2017-10-31 ENCOUNTER — Ambulatory Visit: Payer: Medicare Other

## 2017-11-07 ENCOUNTER — Encounter: Payer: Self-pay | Admitting: Physician Assistant

## 2017-11-07 ENCOUNTER — Ambulatory Visit: Payer: Medicare Other | Admitting: Physician Assistant

## 2017-11-07 VITALS — BP 111/70 | HR 90 | Temp 97.3°F | Ht 69.0 in | Wt 198.8 lb

## 2017-11-07 DIAGNOSIS — E1140 Type 2 diabetes mellitus with diabetic neuropathy, unspecified: Secondary | ICD-10-CM | POA: Diagnosis not present

## 2017-11-07 DIAGNOSIS — L97521 Non-pressure chronic ulcer of other part of left foot limited to breakdown of skin: Secondary | ICD-10-CM

## 2017-11-07 DIAGNOSIS — Z23 Encounter for immunization: Secondary | ICD-10-CM

## 2017-11-07 DIAGNOSIS — M5137 Other intervertebral disc degeneration, lumbosacral region: Secondary | ICD-10-CM | POA: Diagnosis not present

## 2017-11-07 DIAGNOSIS — E1142 Type 2 diabetes mellitus with diabetic polyneuropathy: Secondary | ICD-10-CM

## 2017-11-07 DIAGNOSIS — E538 Deficiency of other specified B group vitamins: Secondary | ICD-10-CM | POA: Diagnosis not present

## 2017-11-07 LAB — BMP8+EGFR
BUN/Creatinine Ratio: 20 (ref 12–28)
BUN: 14 mg/dL (ref 8–27)
CO2: 25 mmol/L (ref 20–29)
Calcium: 9.2 mg/dL (ref 8.7–10.3)
Chloride: 100 mmol/L (ref 96–106)
Creatinine, Ser: 0.71 mg/dL (ref 0.57–1.00)
GFR calc Af Amer: 106 mL/min/{1.73_m2} (ref >59)
GFR calc non Af Amer: 92 mL/min/{1.73_m2} (ref >59)
Glucose: 122 mg/dL — ABNORMAL HIGH (ref 65–99)
Potassium: 4.7 mmol/L (ref 3.5–5.2)
Sodium: 140 mmol/L (ref 134–144)

## 2017-11-07 LAB — BAYER DCA HB A1C WAIVED: HB A1C (BAYER DCA - WAIVED): 6.9 % (ref <7.0)

## 2017-11-07 MED ORDER — OXYCODONE-ACETAMINOPHEN 10-325 MG PO TABS: 1.0000 | ORAL_TABLET | Freq: Three times a day (TID) | ORAL | 0 refills | Status: DC | PRN

## 2017-11-07 MED ORDER — OXYCODONE-ACETAMINOPHEN 10-325 MG PO TABS: 1.0000 | ORAL_TABLET | Freq: Three times a day (TID) | ORAL | 0 refills | Status: DC

## 2017-11-07 MED ORDER — DULOXETINE HCL 60 MG PO CPEP: 60.0000 mg | ORAL_CAPSULE | Freq: Every day | ORAL | 5 refills | Status: DC

## 2017-11-07 NOTE — Progress Notes (Signed)
BP 111/70   Pulse 90   Temp (!) 97.3 F (36.3 C) (Oral)   Ht 5' 9"  (1.753 m)   Wt 198 lb 12.8 oz (90.2 kg)   BMI 29.36 kg/m    Subjective:    Patient ID: Rhonda Thomas, female    DOB: 01-12-1956, 61 y.o.   MRN: [id number]  HPI: Rhonda Thomas is a 61 y.o. female presenting on 11/07/2017 for Medication Refill This patient comes in for periodic recheck on medications and conditions including degenerative disc disease, diabetic peripheral neuropathy, type 2 diabetes, B12 deficiency.  Patient had an implantable device for her pain put in earlier in the month.  She states that she knows the pain is more than 50% reduced.  She saw her surgeon yesterday and they are very happy with the results.  She will be having surgery later this year for her foot.  We will have labs drawn today.  She wants to get up-to-date on her pneumonia vaccine..   If he can check through the podiatry on a regular basis.  We will have her kidneys checked today.  She plans to have an eye exam in the next month.  Her A1c and lipids have been performed this year. All medications are reviewed today. There are no reports of any problems with the medications. All of the medical conditions are reviewed and updated.  Lab work is reviewed and will be ordered as medically necessary. There are no new problems reported with today's visit.    Relevant past medical, surgical, family and social history reviewed and updated as indicated. Allergies and medications reviewed and updated.  Past Medical History:  Diagnosis Date  . Anxiety   . Attention deficit disorder (ADD)   . Depression   . Diabetes ([place])   . Hiatal hernia   . Movement disorder   . Neuropathy   . Schatzki's ring   . Sleep apnea     Past Surgical History:  Procedure Laterality Date  . ABDOMINAL HYSTERECTOMY    . BREAST LUMPECTOMY WITH RADIOACTIVE SEED LOCALIZATION Right 04/25/2016   Procedure: RIGHT BREAST LUMPECTOMY WITH RADIOACTIVE SEED LOCALIZATION;  Surgeon:  Autumn Messing III, MD;  Location: [facility];  Service: General;  Laterality: Right;  . CHOLECYSTECTOMY    . RECTOCELE REPAIR    . TOE AMPUTATION Right    2nd toe rt foot  . TUBAL LIGATION      Review of Systems  Constitutional: Negative.   HENT: Negative.   Eyes: Negative.   Respiratory: Negative.   Gastrointestinal: Negative.   Genitourinary: Negative.     Allergies as of 11/07/2017      Reactions   Actos [pioglitazone]    Edema   Invokana [canagliflozin]    vaginitis      Medication List        Accurate as of 11/07/17  2:04 PM. Always use your most recent med list.          aspirin 81 MG EC tablet TAKE 1 TABLET BY MOUTH EVERY DAY   carbidopa-levodopa 25-100 MG tablet Commonly known as:  SINEMET IR TAKE ONE TABLET BY MOUTH THREE TIMES DAILY   cyanocobalamin 1000 MCG/ML injection Commonly known as:  (VITAMIN B-12) Inject into the muscle.   DULoxetine 60 MG capsule Commonly known as:  CYMBALTA Take 1 capsule (60 mg total) by mouth daily.   fluconazole 150 MG tablet Commonly known as:  DIFLUCAN 1 po q week x 4 weeks   glucose blood  test strip Commonly known as:  ONETOUCH VERIO Test bid, DX E11.40   insulin glargine 100 UNIT/ML injection Commonly known as:  LANTUS Inject 0.2 mLs (20 Units total) into the skin at bedtime.   insulin lispro 100 UNIT/ML KiwkPen Commonly known as:  HUMALOG KWIKPEN Inject 0.05-0.1 mLs (5-10 Units total) into the skin 3 (three) times daily.   metFORMIN 500 MG tablet Commonly known as:  GLUCOPHAGE TAKE TWO TABLETS BY MOUTH EVERY DAY AS DIRECTED   onetouch ultrasoft lancets CHECK DAILY AND AS NEEDED   OXcarbazepine 150 MG tablet Commonly known as:  TRILEPTAL TAKE 1 TABLET BY MOUTH IN THE MORNING AND 2 TABLETS AT BEDTIME   oxyCODONE-acetaminophen 10-325 MG tablet Commonly known as:  PERCOCET Take 1 tablet by mouth every 8 (eight) hours.   oxyCODONE-acetaminophen 10-325 MG tablet Commonly known as:   PERCOCET Take 1 tablet by mouth every 8 (eight) hours as needed for pain.   oxyCODONE-acetaminophen 10-325 MG tablet Commonly known as:  PERCOCET Take 1 tablet by mouth every 8 (eight) hours as needed for pain.   pregabalin 150 MG capsule Commonly known as:  LYRICA TAKE 1 CAPSULE BY MOUTH THREE TIMES DAILY (COUPON CARD ON FILE BUNAVAIL CARD)   TRULICITY 1.5 XV/4.0GQ Sopn Generic drug:  Dulaglutide INJECT 1.5MG INTO THE SKIN ONCE WEEKLY   UNIFINE PENTIPS 31G X 6 MM Misc Generic drug:  Insulin Pen Needle   GLOBAL EASE INJECT PEN NEEDLES 31G X 5 MM Misc Generic drug:  Insulin Pen Needle USE FOUR TIMES DAILY          Objective:    BP 111/70   Pulse 90   Temp (!) 97.3 F (36.3 C) (Oral)   Ht 5' 9"  (1.753 m)   Wt 198 lb 12.8 oz (90.2 kg)   BMI 29.36 kg/m   Allergies  Allergen Reactions  . Actos [Pioglitazone]     Edema   . Invokana [Canagliflozin]     vaginitis    Physical Exam  Constitutional: She is oriented to person, place, and time. [name].  HENT:  Head: Normocephalic and atraumatic.  Eyes: Conjunctivae and EOM are normal. Pupils are equal, round, and reactive to light.  Cardiovascular: Normal rate, regular rhythm, normal heart sounds and intact distal pulses.  Pulmonary/Chest: Effort normal and breath sounds normal.  Abdominal: Soft. Bowel sounds are normal.  Neurological: She is alert and oriented to person, place, and time. She has normal reflexes.  Skin: Skin is warm and dry. No rash noted.  Psychiatric: She has a normal mood and affect. [name]. Judgment and thought content normal.  Vitals reviewed.   Results for orders placed or performed in visit on 11/07/17  Bayer DCA Hb A1c Waived  Result Value Ref Range   Bayer DCA Hb A1c Waived 6.9 <7.0 %      Assessment & Plan:   1. Skin ulcer of left foot, limited to breakdown of skin (HCC) - DULoxetine (CYMBALTA) 60 MG capsule; Take 1 capsule (60 mg  total) by mouth daily.  Dispense: 30 capsule; Refill: 5  2. DDD (degenerative disc disease), lumbosacral - oxyCODONE-acetaminophen (PERCOCET) 10-325 MG tablet; Take 1 tablet by mouth every 8 (eight) hours.  Dispense: 90 tablet; Refill: 0 - oxyCODONE-acetaminophen (PERCOCET) 10-325 MG tablet; Take 1 tablet by mouth every 8 (eight) hours as needed for pain.  Dispense: 90 tablet; Refill: 0  3. Diabetic peripheral neuropathy (HCC) - Microalbumin / creatinine urine ratio - BMP8+EGFR - Bayer  DCA Hb A1c Waived  4. Type 2 diabetes mellitus with diabetic neuropathy, unspecified whether long term insulin use (HCC) - Microalbumin / creatinine urine ratio - BMP8+EGFR - Bayer DCA Hb A1c Waived  5. B12 deficiency    Current Outpatient Medications:  .  aspirin 81 MG EC tablet, TAKE 1 TABLET BY MOUTH EVERY DAY, Disp: 90 tablet, Rfl: 1 .  carbidopa-levodopa (SINEMET IR) 25-100 MG tablet, TAKE ONE TABLET BY MOUTH THREE TIMES DAILY, Disp: 90 tablet, Rfl: 1 .  cyanocobalamin (,VITAMIN B-12,) 1000 MCG/ML injection, Inject into the muscle., Disp: , Rfl:  .  DULoxetine (CYMBALTA) 60 MG capsule, Take 1 capsule (60 mg total) by mouth daily., Disp: 30 capsule, Rfl: 5 .  fluconazole (DIFLUCAN) 150 MG tablet, 1 po q week x 4 weeks, Disp: 4 tablet, Rfl: 2 .  GLOBAL EASE INJECT PEN NEEDLES 31G X 5 MM MISC, USE FOUR TIMES DAILY, Disp: 100 each, Rfl: 3 .  glucose blood (ONETOUCH VERIO) test strip, Test bid, DX E11.40, Disp: 100 each, Rfl: 3 .  insulin glargine (LANTUS) 100 UNIT/ML injection, Inject 0.2 mLs (20 Units total) into the skin at bedtime., Disp: 45 mL, Rfl: 3 .  insulin lispro (HUMALOG KWIKPEN) 100 UNIT/ML KiwkPen, Inject 0.05-0.1 mLs (5-10 Units total) into the skin 3 (three) times daily., Disp: 15 mL, Rfl: 11 .  Lancets (ONETOUCH ULTRASOFT) lancets, CHECK DAILY AND AS NEEDED, Disp: 100 each, Rfl: 3 .  metFORMIN (GLUCOPHAGE) 500 MG tablet, TAKE TWO TABLETS BY MOUTH EVERY DAY AS DIRECTED, Disp: 60 tablet,  Rfl: 2 .  OXcarbazepine (TRILEPTAL) 150 MG tablet, TAKE 1 TABLET BY MOUTH IN THE MORNING AND 2 TABLETS AT BEDTIME, Disp: 90 tablet, Rfl: 1 .  oxyCODONE-acetaminophen (PERCOCET) 10-325 MG tablet, Take 1 tablet by mouth every 8 (eight) hours., Disp: 90 tablet, Rfl: 0 .  oxyCODONE-acetaminophen (PERCOCET) 10-325 MG tablet, Take 1 tablet by mouth every 8 (eight) hours as needed for pain., Disp: 90 tablet, Rfl: 0 .  oxyCODONE-acetaminophen (PERCOCET) 10-325 MG tablet, Take 1 tablet by mouth every 8 (eight) hours as needed for pain., Disp: 90 tablet, Rfl: 0 .  pregabalin (LYRICA) 150 MG capsule, TAKE 1 CAPSULE BY MOUTH THREE TIMES DAILY (COUPON CARD ON FILE BUNAVAIL CARD), Disp: 90 capsule, Rfl: 5 .  TRULICITY 1.5 SL/7.5PY SOPN, INJECT 1.5MG INTO THE SKIN ONCE WEEKLY, Disp: 2 mL, Rfl: 2 .  UNIFINE PENTIPS 31G X 6 MM MISC, , Disp: , Rfl:   Current Facility-Administered Medications:  .  cyanocobalamin ((VITAMIN B-12)) injection 1,000 mcg, 1,000 mcg, Intramuscular, Q30 days, Vedha Tercero S, PA-C, 1,000 mcg at 11/07/17 1004 Continue all other maintenance medications as listed above.  Follow up plan: Return in about 5 months (around 04/07/2018), or if symptoms worsen or fail to improve, for recheck lab.  Educational handout given for [place] PA-C [place] [address]  [place], [place] [address] [phone]   11/07/2017, 2:04 PM

## 2017-11-07 NOTE — Patient Instructions (Signed)
In a few days you may receive a survey in the mail or online from [employer] regarding your visit with us today. Please take a moment to fill this out. Your feedback is very important to our whole office. It can help us better understand your needs as well as improve your experience and satisfaction. Thank you for taking your time to complete it. We care about you.  Marchelle Rinella, PA-C  

## 2017-11-08 LAB — MICROALBUMIN / CREATININE URINE RATIO
Creatinine, Urine: 211.8 mg/dL
Microalb/Creat Ratio: 7.2 mg/g creat (ref 0.0–30.0)
Microalbumin, Urine: 15.2 ug/mL

## 2017-11-19 ENCOUNTER — Other Ambulatory Visit: Payer: Self-pay | Admitting: Physician Assistant

## 2017-11-24 ENCOUNTER — Other Ambulatory Visit: Payer: Self-pay | Admitting: Physician Assistant

## 2017-11-24 DIAGNOSIS — L97521 Non-pressure chronic ulcer of other part of left foot limited to breakdown of skin: Secondary | ICD-10-CM

## 2017-11-24 MED ORDER — DULOXETINE HCL 60 MG PO CPEP: 60.0000 mg | ORAL_CAPSULE | Freq: Every day | ORAL | 5 refills | Status: DC

## 2017-11-24 NOTE — Telephone Encounter (Signed)
What is the name of the medication? DULoxetine (CYMBALTA) 60 MG capsule Have you contacted your pharmacy to request a refill? Yes   Which pharmacy would you like this sent to? [facility]   Patient notified that their request is being sent to the clinical staff for review and that they should receive a call once it is complete. If they do not receive a call within 24 hours they can check with their pharmacy or our office.

## 2017-11-24 NOTE — Telephone Encounter (Signed)
rx sent to pharmacy for patient  

## 2017-12-07 ENCOUNTER — Encounter: Payer: Self-pay | Admitting: *Deleted

## 2017-12-16 ENCOUNTER — Other Ambulatory Visit: Payer: Self-pay | Admitting: Physician Assistant

## 2017-12-20 ENCOUNTER — Ambulatory Visit (INDEPENDENT_AMBULATORY_CARE_PROVIDER_SITE_OTHER): Payer: Medicare Other | Admitting: *Deleted

## 2017-12-20 DIAGNOSIS — E538 Deficiency of other specified B group vitamins: Secondary | ICD-10-CM | POA: Diagnosis not present

## 2017-12-20 NOTE — Progress Notes (Signed)
Pt given Cyanocobalamin inj Tolerated well 

## 2017-12-23 ENCOUNTER — Other Ambulatory Visit: Payer: Self-pay | Admitting: Family Medicine

## 2017-12-25 ENCOUNTER — Encounter: Payer: Medicare Other | Attending: Psychology | Admitting: Psychology

## 2017-12-25 DIAGNOSIS — Z713 Dietary counseling and surveillance: Secondary | ICD-10-CM | POA: Insufficient documentation

## 2017-12-25 DIAGNOSIS — E1140 Type 2 diabetes mellitus with diabetic neuropathy, unspecified: Secondary | ICD-10-CM | POA: Insufficient documentation

## 2017-12-26 ENCOUNTER — Other Ambulatory Visit: Payer: Self-pay | Admitting: Physician Assistant

## 2017-12-26 ENCOUNTER — Telehealth: Payer: Self-pay

## 2017-12-26 MED ORDER — INSULIN LISPRO 200 UNIT/ML ~~LOC~~ SOPN: 3.0000 [IU] | PEN_INJECTOR | Freq: Three times a day (TID) | SUBCUTANEOUS | 5 refills | Status: DC

## 2017-12-26 NOTE — Telephone Encounter (Signed)
humalog script is sent to the pharmacy

## 2017-12-26 NOTE — Telephone Encounter (Signed)
Insurance denied prior auth for [employer]  Need to fail or have intolerance to Humalog Cartridge, Humalog Junior [employer], or Humalog [employer]

## 2018-01-16 ENCOUNTER — Other Ambulatory Visit: Payer: Self-pay | Admitting: Physician Assistant

## 2018-01-16 DIAGNOSIS — G2581 Restless legs syndrome: Secondary | ICD-10-CM

## 2018-01-16 DIAGNOSIS — E1142 Type 2 diabetes mellitus with diabetic polyneuropathy: Secondary | ICD-10-CM

## 2018-01-22 ENCOUNTER — Ambulatory Visit (INDEPENDENT_AMBULATORY_CARE_PROVIDER_SITE_OTHER): Payer: Medicare Other | Admitting: *Deleted

## 2018-01-22 DIAGNOSIS — E538 Deficiency of other specified B group vitamins: Secondary | ICD-10-CM | POA: Diagnosis not present

## 2018-01-22 NOTE — Progress Notes (Signed)
Pt given cyanocobalamin inj Tolerated well 

## 2018-01-31 ENCOUNTER — Encounter: Payer: Self-pay | Admitting: Physician Assistant

## 2018-02-02 ENCOUNTER — Other Ambulatory Visit: Payer: Self-pay | Admitting: Physician Assistant

## 2018-02-02 DIAGNOSIS — M5137 Other intervertebral disc degeneration, lumbosacral region: Secondary | ICD-10-CM

## 2018-02-02 MED ORDER — OXYCODONE-ACETAMINOPHEN 10-325 MG PO TABS: 1.0000 | ORAL_TABLET | Freq: Four times a day (QID) | ORAL | 0 refills | Status: DC | PRN

## 2018-02-05 ENCOUNTER — Other Ambulatory Visit: Payer: Self-pay | Admitting: Physician Assistant

## 2018-02-09 ENCOUNTER — Encounter: Payer: Self-pay | Admitting: Physician Assistant

## 2018-02-09 ENCOUNTER — Ambulatory Visit (INDEPENDENT_AMBULATORY_CARE_PROVIDER_SITE_OTHER): Payer: Medicare Other | Admitting: Physician Assistant

## 2018-02-09 VITALS — BP 115/65 | HR 80 | Temp 96.9°F | Ht 69.0 in | Wt 199.8 lb

## 2018-02-09 DIAGNOSIS — L97521 Non-pressure chronic ulcer of other part of left foot limited to breakdown of skin: Secondary | ICD-10-CM

## 2018-02-09 DIAGNOSIS — M5137 Other intervertebral disc degeneration, lumbosacral region: Secondary | ICD-10-CM

## 2018-02-09 DIAGNOSIS — E1140 Type 2 diabetes mellitus with diabetic neuropathy, unspecified: Secondary | ICD-10-CM

## 2018-02-09 DIAGNOSIS — F331 Major depressive disorder, recurrent, moderate: Secondary | ICD-10-CM

## 2018-02-09 LAB — HEMOGLOBIN A1C
Est. average glucose Bld gHb Est-mCnc: 160 mg/dL
Hgb A1c MFr Bld: 7.2 % — ABNORMAL HIGH (ref 4.8–5.6)

## 2018-02-09 MED ORDER — OXYCODONE-ACETAMINOPHEN 10-325 MG PO TABS: 1.0000 | ORAL_TABLET | Freq: Three times a day (TID) | ORAL | 0 refills | Status: DC | PRN

## 2018-02-09 MED ORDER — OXYCODONE-ACETAMINOPHEN 10-325 MG PO TABS
1.0000 | ORAL_TABLET | Freq: Three times a day (TID) | ORAL | 0 refills | Status: DC | PRN
Start: 2018-02-09 — End: 2018-06-05

## 2018-02-09 NOTE — Progress Notes (Signed)
BP 115/65   Pulse 80   Temp (!) 96.9 F (36.1 C) (Oral)   Ht _0  (1.753 m)   Wt 199 lb 12.8 oz (90.6 kg)   BMI 29.51 kg/m    Subjective:    Patient ID: Rhonda Thomas, female    DOB: May 10, 1956, 62 y.o.   MRN: [id number]  HPI: Rhonda Thomas is a 62 y.o. female presenting on 02/09/2018 for Follow-up (3 month ) Degenerative disc disease, type 2 diabetes, skin ulcer, depression. Chronic pain is under control, patient has no new complaints. Medications are keeping things stable. Needs refills for the next three months.  [place] Controlled Substance website checked and normal. Drug screen normal this year.  The patient is still under the care of wound care and podiatry.  She has instructions not to walk a brace on one foot and a boot on another.  They are having a difficult time getting her lesion healed.  Up until this heals she cannot have her surgery.   Past Medical History:  Diagnosis Date  . Anxiety   . Attention deficit disorder (ADD)   . Depression   . Diabetes ([place])   . Hiatal hernia   . Movement disorder   . Neuropathy   . Schatzki's ring   . Sleep apnea    Relevant past medical, surgical, family and social history reviewed and updated as indicated. Interim medical history since our last visit reviewed. Allergies and medications reviewed and updated. DATA REVIEWED: CHART IN EPIC  Family History reviewed for pertinent findings.  Review of Systems  Constitutional: Negative.  Negative for activity change, fatigue and fever.  HENT: Negative.   Eyes: Negative.   Respiratory: Negative.  Negative for cough.   Cardiovascular: Negative.  Negative for chest pain.  Gastrointestinal: Negative.  Negative for abdominal pain.  Endocrine: Negative.   Genitourinary: Negative.  Negative for dysuria.  Musculoskeletal: Positive for arthralgias and gait problem.  Skin: Positive for wound.    Allergies as of 02/09/2018      Reactions   Actos [pioglitazone]    Edema   Invokana  [canagliflozin]    vaginitis      Medication List        Accurate as of 02/09/18  2:05 PM. Always use your most recent med list.          aspirin 81 MG EC tablet TAKE 1 TABLET BY MOUTH EVERY DAY   carbidopa-levodopa 25-100 MG tablet Commonly known as:  SINEMET IR TAKE ONE TABLET BY MOUTH THREE TIMES DAILY   cyanocobalamin 1000 MCG/ML injection Commonly known as:  (VITAMIN B-12) Inject into the muscle.   DULoxetine 60 MG capsule Commonly known as:  CYMBALTA Take 1 capsule (60 mg total) by mouth daily.   fluconazole 150 MG tablet Commonly known as:  DIFLUCAN TAKE ONE TABLET BY MOUTH every WEEK FOR FOUR WEEKS   glucose blood test strip Commonly known as:  ONETOUCH VERIO Test bid, DX E11.40   insulin glargine 100 UNIT/ML injection Commonly known as:  LANTUS Inject 0.2 mLs (20 Units total) into the skin at bedtime.   Insulin Lispro 200 UNIT/ML Sopn Commonly known as:  HUMALOG KWIKPEN Inject 3-20 Units into the skin 4 (four) times daily -  before meals and at bedtime.   metFORMIN 500 MG tablet Commonly known as:  GLUCOPHAGE TAKE TWO TABLETS BY MOUTH EVERY DAY AS DIRECTED   onetouch ultrasoft lancets CHECK DAILY AND AS NEEDED   OXcarbazepine 150 MG tablet Commonly known as:  TRILEPTAL TAKE ONE TABLET BY MOUTH EVERY MORNING AND TAKE TWO TABLETS BY MOUTH AT BEDTIME   oxyCODONE-acetaminophen 10-325 MG tablet Commonly known as:  PERCOCET Take 1 tablet by mouth every 8 (eight) hours as needed for pain.   oxyCODONE-acetaminophen 10-325 MG tablet Commonly known as:  PERCOCET Take 1 tablet by mouth every 8 (eight) hours as needed for pain.   oxyCODONE-acetaminophen 10-325 MG tablet Commonly known as:  PERCOCET Take 1 tablet by mouth every 8 (eight) hours as needed for pain.   pregabalin 150 MG capsule Commonly known as:  LYRICA TAKE 1 CAPSULE BY MOUTH THREE TIMES DAILY (COUPON CARD ON FILE BUNAVAIL CARD)   TRULICITY 1.5 KZ/6.0FU Sopn Generic drug:   Dulaglutide INJECT 1.5 MG into THE SKIN ONCE WEEKLY   UNIFINE PENTIPS 31G X 6 MM Misc Generic drug:  Insulin Pen Needle   GLOBAL EASE INJECT PEN NEEDLES 31G X 5 MM Misc Generic drug:  Insulin Pen Needle USE FOUR TIMES DAILY          Objective:    BP 115/65   Pulse 80   Temp (!) 96.9 F (36.1 C) (Oral)   Ht _0  (1.753 m)   Wt 199 lb 12.8 oz (90.6 kg)   BMI 29.51 kg/m   Allergies  Allergen Reactions  . Actos [Pioglitazone]     Edema   . Invokana [Canagliflozin]     vaginitis    Wt Readings from Last 3 Encounters:  02/09/18 199 lb 12.8 oz (90.6 kg)  11/07/17 198 lb 12.8 oz (90.2 kg)  10/02/17 200 lb (90.7 kg)    Physical Exam  Constitutional: She is oriented to person, place, and time. [name].  HENT:  Head: Normocephalic and atraumatic.  Eyes: Conjunctivae and EOM are normal. Pupils are equal, round, and reactive to light.  Cardiovascular: Normal rate, regular rhythm, normal heart sounds and intact distal pulses.  Pulmonary/Chest: Effort normal and breath sounds normal.  Abdominal: Soft. Bowel sounds are normal.  Neurological: She is alert and oriented to person, place, and time. She has normal reflexes.  Skin: Skin is warm and dry. No rash noted.  Psychiatric: She has a normal mood and affect. [name]. Judgment and thought content normal.  Nursing note and vitals reviewed.   Results for orders placed or performed in visit on 11/07/17  Microalbumin / creatinine urine ratio  Result Value Ref Range   Creatinine, Urine 211.8 Not Estab. mg/dL   Microalbumin, Urine 15.2 Not Estab. ug/mL   Microalb/Creat Ratio 7.2 0.0 - 30.0 mg/g creat  BMP8+EGFR  Result Value Ref Range   Glucose 122 (H) 65 - 99 mg/dL   BUN 14 8 - 27 mg/dL   Creatinine, Ser 0.71 0.57 - 1.00 mg/dL   GFR calc non Af Amer 92 >59 mL/min/1.73   GFR calc Af Amer 106 >59 mL/min/1.73   BUN/Creatinine Ratio 20 12 - 28   Sodium 140 134 - 144 mmol/L    Potassium 4.7 3.5 - 5.2 mmol/L   Chloride 100 96 - 106 mmol/L   CO2 25 20 - 29 mmol/L   Calcium 9.2 8.7 - 10.3 mg/dL  Bayer DCA Hb A1c Waived  Result Value Ref Range   Bayer DCA Hb A1c Waived 6.9 <7.0 %      Assessment & Plan:   1. DDD (degenerative disc disease), lumbosacral - oxyCODONE-acetaminophen (PERCOCET) 10-325 MG tablet; Take 1 tablet by mouth every 8 (eight) hours as needed for pain.  Dispense: 90 tablet; Refill: 0 - oxyCODONE-acetaminophen (PERCOCET) 10-325 MG tablet; Take 1 tablet by mouth every 8 (eight) hours as needed for pain.  Dispense: 120 tablet; Refill: 0  2. Type 2 diabetes mellitus with diabetic neuropathy, unspecified whether long term insulin use (HCC) - Hemoglobin A1c - CMP14+EGFR  3. Skin ulcer of left foot, limited to breakdown of skin ([place])  4. Major depressive disorder, recurrent episode, moderate (HCC)   Continue all other maintenance medications as listed above.  Follow up plan: Return in about 4 months (around 06/11/2018) for recheck.  Educational handout given for [place] PA-C [place] [address]  [place], [place] [address] [phone]   02/09/2018, 2:05 PM

## 2018-02-10 LAB — CMP14+EGFR
ALT: 13 IU/L (ref 0–32)
AST: 12 IU/L (ref 0–40)
Albumin/Globulin Ratio: 1.6 (ref 1.2–2.2)
Albumin: 3.8 g/dL (ref 3.6–4.8)
Alkaline Phosphatase: 79 IU/L (ref 39–117)
BUN/Creatinine Ratio: 25 (ref 12–28)
BUN: 18 mg/dL (ref 8–27)
Bilirubin Total: 0.3 mg/dL (ref 0.0–1.2)
CO2: 25 mmol/L (ref 20–29)
Calcium: 9 mg/dL (ref 8.7–10.3)
Chloride: 101 mmol/L (ref 96–106)
Creatinine, Ser: 0.71 mg/dL (ref 0.57–1.00)
GFR calc Af Amer: 106 mL/min/{1.73_m2} (ref >59)
GFR calc non Af Amer: 92 mL/min/{1.73_m2} (ref >59)
Globulin, Total: 2.4 g/dL (ref 1.5–4.5)
Glucose: 142 mg/dL — ABNORMAL HIGH (ref 65–99)
Potassium: 4.5 mmol/L (ref 3.5–5.2)
Sodium: 141 mmol/L (ref 134–144)
Total Protein: 6.2 g/dL (ref 6.0–8.5)

## 2018-02-20 ENCOUNTER — Ambulatory Visit (INDEPENDENT_AMBULATORY_CARE_PROVIDER_SITE_OTHER): Payer: Medicare Other | Admitting: *Deleted

## 2018-02-20 DIAGNOSIS — E538 Deficiency of other specified B group vitamins: Secondary | ICD-10-CM

## 2018-02-20 NOTE — Progress Notes (Signed)
Pt given Cyanocobalamin inj Tolerated well 

## 2018-02-23 ENCOUNTER — Other Ambulatory Visit: Payer: Self-pay | Admitting: Physician Assistant

## 2018-02-23 DIAGNOSIS — E1140 Type 2 diabetes mellitus with diabetic neuropathy, unspecified: Secondary | ICD-10-CM

## 2018-02-23 NOTE — Telephone Encounter (Signed)
Last seen 02/09/18  Rhonda Thomas

## 2018-02-25 ENCOUNTER — Encounter: Payer: Self-pay | Admitting: Physician Assistant

## 2018-02-26 ENCOUNTER — Other Ambulatory Visit: Payer: Self-pay | Admitting: Physician Assistant

## 2018-02-26 MED ORDER — SULFAMETHOXAZOLE-TRIMETHOPRIM 800-160 MG PO TABS
1.0000 | ORAL_TABLET | Freq: Two times a day (BID) | ORAL | 0 refills | Status: DC
Start: 2018-02-26 — End: 2018-04-13

## 2018-02-27 ENCOUNTER — Other Ambulatory Visit: Payer: Self-pay | Admitting: Physician Assistant

## 2018-02-27 DIAGNOSIS — G2581 Restless legs syndrome: Secondary | ICD-10-CM

## 2018-02-27 DIAGNOSIS — E1142 Type 2 diabetes mellitus with diabetic polyneuropathy: Secondary | ICD-10-CM

## 2018-03-12 ENCOUNTER — Other Ambulatory Visit: Payer: Self-pay | Admitting: *Deleted

## 2018-03-12 ENCOUNTER — Other Ambulatory Visit: Payer: Self-pay | Admitting: Physician Assistant

## 2018-03-12 DIAGNOSIS — E1142 Type 2 diabetes mellitus with diabetic polyneuropathy: Secondary | ICD-10-CM

## 2018-03-12 MED ORDER — DULAGLUTIDE 1.5 MG/0.5ML ~~LOC~~ SOAJ: SUBCUTANEOUS | 5 refills | Status: DC

## 2018-03-12 MED ORDER — OXCARBAZEPINE 150 MG PO TABS: ORAL_TABLET | ORAL | 5 refills | Status: DC

## 2018-03-13 ENCOUNTER — Encounter: Payer: Self-pay | Admitting: Family Medicine

## 2018-03-13 ENCOUNTER — Other Ambulatory Visit: Payer: Self-pay | Admitting: *Deleted

## 2018-03-13 DIAGNOSIS — G2581 Restless legs syndrome: Secondary | ICD-10-CM

## 2018-03-13 MED ORDER — CARBIDOPA-LEVODOPA 25-100 MG PO TABS: 1.0000 | ORAL_TABLET | Freq: Three times a day (TID) | ORAL | 0 refills | Status: DC

## 2018-03-23 ENCOUNTER — Ambulatory Visit (INDEPENDENT_AMBULATORY_CARE_PROVIDER_SITE_OTHER): Payer: Medicare Other | Admitting: *Deleted

## 2018-03-23 DIAGNOSIS — E538 Deficiency of other specified B group vitamins: Secondary | ICD-10-CM | POA: Diagnosis not present

## 2018-03-23 NOTE — Progress Notes (Signed)
Pt given Cyanocobalamin inj Tolerated well 

## 2018-04-04 ENCOUNTER — Encounter: Payer: Self-pay | Admitting: Family Medicine

## 2018-04-04 ENCOUNTER — Ambulatory Visit: Payer: Medicare Other | Admitting: Family Medicine

## 2018-04-04 VITALS — BP 133/73 | HR 101 | Temp 97.3°F | Ht 69.0 in | Wt 198.2 lb

## 2018-04-04 DIAGNOSIS — S20212A Contusion of left front wall of thorax, initial encounter: Secondary | ICD-10-CM | POA: Diagnosis not present

## 2018-04-04 DIAGNOSIS — S0181XA Laceration without foreign body of other part of head, initial encounter: Secondary | ICD-10-CM

## 2018-04-09 ENCOUNTER — Ambulatory Visit: Payer: Medicare Other | Admitting: Physician Assistant

## 2018-04-11 ENCOUNTER — Ambulatory Visit: Payer: Medicare Other | Admitting: Family Medicine

## 2018-04-12 ENCOUNTER — Encounter: Payer: Self-pay | Admitting: Family Medicine
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
## 2018-04-13 ENCOUNTER — Encounter: Payer: Self-pay | Admitting: Physician Assistant

## 2018-04-13 ENCOUNTER — Ambulatory Visit: Payer: Medicare Other | Admitting: Physician Assistant

## 2018-04-13 VITALS — BP 119/69 | HR 98 | Temp 97.6°F | Ht 69.0 in | Wt 199.6 lb

## 2018-04-13 DIAGNOSIS — S301XXD Contusion of abdominal wall, subsequent encounter: Secondary | ICD-10-CM

## 2018-04-13 DIAGNOSIS — S12601D Unspecified nondisplaced fracture of seventh cervical vertebra, subsequent encounter for fracture with routine healing: Secondary | ICD-10-CM | POA: Diagnosis not present

## 2018-04-13 MED ORDER — CYCLOBENZAPRINE HCL 10 MG PO TABS: 10.0000 mg | ORAL_TABLET | Freq: Three times a day (TID) | ORAL | 1 refills | Status: DC | PRN

## 2018-04-13 MED ORDER — FLUCONAZOLE 150 MG PO TABS: ORAL_TABLET | ORAL | 5 refills | Status: DC

## 2018-04-13 NOTE — Patient Instructions (Signed)
In a few days you may receive a survey in the mail or online from [employer] regarding your visit with us today. Please take a moment to fill this out. Your feedback is very important to our whole office. It can help us better understand your needs as well as improve your experience and satisfaction. Thank you for taking your time to complete it. We care about you.  Jlee Harkless, PA-C  

## 2018-04-16 ENCOUNTER — Other Ambulatory Visit: Payer: Self-pay | Admitting: Physician Assistant

## 2018-04-16 DIAGNOSIS — E785 Hyperlipidemia, unspecified: Secondary | ICD-10-CM
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
## 2018-04-20 ENCOUNTER — Other Ambulatory Visit: Payer: Self-pay | Admitting: Physician Assistant

## 2018-04-20 DIAGNOSIS — L97521 Non-pressure chronic ulcer of other part of left foot limited to breakdown of skin: Secondary | ICD-10-CM

## 2018-04-23 ENCOUNTER — Ambulatory Visit: Payer: Medicare Other

## 2018-05-16 ENCOUNTER — Other Ambulatory Visit: Payer: Self-pay | Admitting: Physician Assistant

## 2018-06-05 ENCOUNTER — Encounter: Payer: Self-pay | Admitting: Physician Assistant

## 2018-06-05 ENCOUNTER — Ambulatory Visit: Payer: Medicare Other | Admitting: Physician Assistant

## 2018-06-05 VITALS — BP 129/75 | HR 92 | Temp 97.7°F | Ht 69.0 in | Wt 193.8 lb

## 2018-06-05 DIAGNOSIS — M5137 Other intervertebral disc degeneration, lumbosacral region: Secondary | ICD-10-CM

## 2018-06-05 DIAGNOSIS — E538 Deficiency of other specified B group vitamins: Secondary | ICD-10-CM

## 2018-06-05 DIAGNOSIS — E1140 Type 2 diabetes mellitus with diabetic neuropathy, unspecified: Secondary | ICD-10-CM | POA: Diagnosis not present

## 2018-06-05 LAB — BAYER DCA HB A1C WAIVED: HB A1C (BAYER DCA - WAIVED): 7.1 % — ABNORMAL HIGH (ref <7.0)

## 2018-06-05 MED ORDER — OXYCODONE-ACETAMINOPHEN 10-325 MG PO TABS: 1.0000 | ORAL_TABLET | Freq: Three times a day (TID) | ORAL | 0 refills | Status: DC | PRN

## 2018-06-06 MED ORDER — CYANOCOBALAMIN 1000 MCG/ML IJ SOLN: 1000.0000 ug | Freq: Once | INTRAMUSCULAR | Status: DC

## 2018-06-06 NOTE — Progress Notes (Signed)
BP 129/75   Pulse 92   Temp 97.7 F (36.5 C) (Oral)   Ht 5\' 9"  (1.753 m)   Wt 193 lb 12.8 oz (87.9 kg)   BMI 28.62 kg/m     Subjective:    Patient ID: Rhonda Thomas, female    DOB: 1956-04-07, 62 y.o.   MRN: [id number]  HPI: Rhonda Thomas is a 61 y.o. female presenting on 06/05/2018 for Pain (medication refill)  This patient comes in for 57-month recheck on her chronic conditions.  She does have degenerative disc disease she also has diabetes.  She has had multiple surgeries this year on her feet.  She is also needing her B12 injection.  She also does need her pain medication refill she will be having another foot surgery this next week with a final hope of being done once and for all.  Past Medical History:  Diagnosis Date  . Anxiety   . Attention deficit disorder (ADD)   . Depression   . Diabetes ([place])   . Hiatal hernia   . Movement disorder   . Neuropathy   . Schatzki's ring   . Sleep apnea    Relevant past medical, surgical, family and social history reviewed and updated as indicated. Interim medical history since our last visit reviewed. Allergies and medications reviewed and updated. DATA REVIEWED: CHART IN EPIC  Family History reviewed for pertinent findings.  Review of Systems  Constitutional: Negative.   HENT: Negative.   Eyes: Negative.   Respiratory: Negative.   Gastrointestinal: Negative.   Genitourinary: Negative.   Musculoskeletal: Positive for arthralgias, gait problem, joint swelling and myalgias.    Allergies as of 06/05/2018      Reactions   Actos [pioglitazone]    Edema   Invokana [canagliflozin]    vaginitis      Medication List        Accurate as of 06/05/18 11:59 PM. Always use your most recent med list.          ASPIRIN LOW DOSE 81 MG EC tablet Generic drug:  aspirin TAKE ONE TABLET BY MOUTH EVERY DAY   carbidopa-levodopa 25-100 MG tablet Commonly known as:  SINEMET IR Take 1 tablet by mouth 3 (three) times daily.     cyanocobalamin 1000 MCG/ML injection Commonly known as:  (VITAMIN B-12) Inject into the muscle.   cyclobenzaprine 10 MG tablet Commonly known as:  FLEXERIL Take 1 tablet (10 mg total) by mouth 3 (three) times daily as needed for muscle spasms.   Dulaglutide 1.5 MG/0.5ML Sopn Commonly known as:  TRULICITY INJECT 1.5 MG into THE SKIN ONCE WEEKLY   DULoxetine 60 MG capsule Commonly known as:  CYMBALTA TAKE ONE CAPSULE BY MOUTH EVERY DAY   fluconazole 150 MG tablet Commonly known as:  DIFLUCAN TAKE ONE TABLET BY MOUTH every WEEK FOR FOUR WEEKS   glucose blood test strip Commonly known as:  ONETOUCH VERIO Test bid, DX E11.40   insulin glargine 100 UNIT/ML injection Commonly known as:  LANTUS Inject 0.2 mLs (20 Units total) into the skin at bedtime.   Insulin Lispro 200 UNIT/ML Sopn Commonly known as:  HUMALOG KWIKPEN Inject 3-20 Units into the skin 4 (four) times daily -  before meals and at bedtime.   metFORMIN 500 MG tablet Commonly known as:  GLUCOPHAGE TAKE TWO TABLETS BY MOUTH EVERY DAY   onetouch ultrasoft lancets CHECK DAILY AND AS NEEDED   OXcarbazepine 150 MG tablet Commonly known as:  TRILEPTAL TAKE ONE TABLET BY MOUTH  EVERY MORNING AND TAKE TWO TABLETS BY MOUTH AT BEDTIME   oxyCODONE-acetaminophen 10-325 MG tablet Commonly known as:  PERCOCET Take 1 tablet by mouth every 8 (eight) hours as needed for pain.   oxyCODONE-acetaminophen 10-325 MG tablet Commonly known as:  PERCOCET Take 1 tablet by mouth every 8 (eight) hours as needed for pain.   oxyCODONE-acetaminophen 10-325 MG tablet Commonly known as:  PERCOCET Take 1 tablet by mouth every 8 (eight) hours as needed for pain.   pregabalin 150 MG capsule Commonly known as:  LYRICA TAKE ONE CAPSULE BY MOUTH THREE TIMES DAILY --per pt stated she will call this in when she needs refilled   ULTRACET 37.5-325 MG tablet Generic drug:  traMADol-acetaminophen Take by mouth.   UNIFINE PENTIPS 31G X 6 MM  Misc Generic drug:  Insulin Pen Needle   GLOBAL EASE INJECT PEN NEEDLES 31G X 5 MM Misc Generic drug:  Insulin Pen Needle USE FOUR TIMES DAILY          Objective:    BP 129/75   Pulse 92   Temp 97.7 F (36.5 C) (Oral)   Ht 5\' 9"  (1.753 m)   Wt 193 lb 12.8 oz (87.9 kg)   BMI 28.62 kg/m    Allergies  Allergen Reactions  . Actos [Pioglitazone]     Edema   . Invokana [Canagliflozin]     vaginitis    Wt Readings from Last 3 Encounters:  06/05/18 193 lb 12.8 oz (87.9 kg)  04/13/18 199 lb 9.6 oz (90.5 kg)  04/04/18 198 lb 4 oz (89.9 kg)    Physical Exam  Constitutional: She is oriented to person, place, and time. [name].  HENT:  Head: Normocephalic and atraumatic.  Eyes: Pupils are equal, round, and reactive to light. Conjunctivae and EOM are normal.  Cardiovascular: Normal rate, regular rhythm, normal heart sounds and intact distal pulses.  Pulmonary/Chest: Effort normal and breath sounds normal.  Abdominal: Soft. Bowel sounds are normal.  Neurological: She is alert and oriented to person, place, and time. She has normal reflexes.  Skin: Skin is warm and dry. No rash noted.  Psychiatric: She has a normal mood and affect. [name]. Judgment and thought content normal.    Results for orders placed or performed in visit on 06/05/18  Bayer DCA Hb A1c Waived  Result Value Ref Range   HB A1C (BAYER DCA - WAIVED) 7.1 (H) <7.0 %      Assessment & Plan:   1. DDD (degenerative disc disease), lumbosacral - oxyCODONE-acetaminophen (PERCOCET) 10-325 MG tablet; Take 1 tablet by mouth every 8 (eight) hours as needed for pain.  Dispense: 90 tablet; Refill: 0 - oxyCODONE-acetaminophen (PERCOCET) 10-325 MG tablet; Take 1 tablet by mouth every 8 (eight) hours as needed for pain.  Dispense: 90 tablet; Refill: 0 - oxyCODONE-acetaminophen (PERCOCET) 10-325 MG tablet; Take 1 tablet by mouth every 8 (eight) hours as needed for pain.   Dispense: 90 tablet; Refill: 0  2. Type 2 diabetes mellitus with diabetic neuropathy, unspecified whether long term insulin use (HCC) - Bayer DCA Hb A1c Waived  3. B12 deficiency - cyanocobalamin ((VITAMIN B-12)) injection 1,000 mcg   Continue all other maintenance medications as listed above.  Follow up plan: No follow-ups on file.  Educational handout given for [place] PA-C [facility] [address]  [place], [place] [address] [phone]   06/06/2018, 8:16 AM

## 2018-06-11 ENCOUNTER — Ambulatory Visit: Payer: Medicare Other | Admitting: Physician Assistant

## 2018-06-13 ENCOUNTER — Other Ambulatory Visit: Payer: Self-pay | Admitting: Physician Assistant

## 2018-06-13 DIAGNOSIS — E1140 Type 2 diabetes mellitus with diabetic neuropathy, unspecified: Secondary | ICD-10-CM

## 2018-06-13 NOTE — Telephone Encounter (Signed)
Last seen 03/05/18

## 2018-06-18 ENCOUNTER — Other Ambulatory Visit: Payer: Self-pay | Admitting: Physician Assistant

## 2018-07-15 ENCOUNTER — Other Ambulatory Visit: Payer: Self-pay | Admitting: Physician Assistant

## 2018-07-15 DIAGNOSIS — E785 Hyperlipidemia, unspecified: Secondary | ICD-10-CM

## 2018-07-15 DIAGNOSIS — L97521 Non-pressure chronic ulcer of other part of left foot limited to breakdown of skin: Secondary | ICD-10-CM

## 2018-07-16 NOTE — Telephone Encounter (Signed)
OV 07/20/18

## 2018-07-17 DIAGNOSIS — Z91199 Patient's noncompliance with other medical treatment and regimen due to unspecified reason: Secondary | ICD-10-CM | POA: Insufficient documentation

## 2018-07-19 ENCOUNTER — Other Ambulatory Visit: Payer: Self-pay | Admitting: Physician Assistant

## 2018-07-19 DIAGNOSIS — G2581 Restless legs syndrome: Secondary | ICD-10-CM

## 2018-07-20 ENCOUNTER — Ambulatory Visit (INDEPENDENT_AMBULATORY_CARE_PROVIDER_SITE_OTHER): Payer: Medicare Other

## 2018-07-20 VITALS — BP 116/70 | HR 87 | Temp 97.3°F | Ht 69.0 in | Wt 193.0 lb

## 2018-07-20 DIAGNOSIS — Z0000 Encounter for general adult medical examination without abnormal findings: Secondary | ICD-10-CM | POA: Diagnosis not present

## 2018-07-20 NOTE — Patient Instructions (Signed)
  Rhonda Thomas , Thank you for taking time to come for your Medicare Wellness Visit. I appreciate your ongoing commitment to your health goals. Please review the following plan we discussed and let me know if I can assist you in the future.   These are the goals we discussed: Goals    . DIET - REDUCE SODIUM INTAKE    . Follow up with Primary Care Provider       This is a list of the screening recommended for you and due dates:  Health Maintenance  Topic Date Due  . Pneumococcal vaccine (1) 08/10/1958  . Pap Smear  08/10/1977  . Eye exam for diabetics  03/13/2015  . Complete foot exam   11/29/2017  . Mammogram  03/08/2018  . Flu Shot  07/12/2018  . Urine Protein Check  11/07/2018  . Hemoglobin A1C  12/05/2018  . Colon Cancer Screening  08/24/2022  . Tetanus Vaccine  06/01/2027  .  Hepatitis C: One time screening is recommended by Center for Disease Control  (CDC) for  adults born from [birth year] through 1965.   Completed  . HIV Screening  Completed

## 2018-07-20 NOTE — Progress Notes (Signed)
Subjective:   Rhonda Thomas is a 62 y.o. female who presents for an Initial Medicare Annual Wellness Visit.  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  In her spare time she enjoys sewing, crafts, and drawing.  She is married with three children and one grandchild.  Her and her husband have one dog, a border collie who is a therapy dog, and a bird.    Review of Systems     Cardiac Risk Factors include: diabetes mellitus;dyslipidemia     Objective:    Today's Vitals   07/20/18 1026  BP: 116/70  Pulse: 87  Temp: (!) 97.3 F (36.3 C)  TempSrc: Oral  Weight: 193 lb (87.5 kg)  Height: 5\' 9"  (1.753 m)   Body mass index is 28.5 kg/m.  Advanced Directives 07/20/2018 04/14/2016 04/29/2015  Does Patient Have a Medical Advance Directive? No No No  Would patient like information on creating a medical advance directive? No - Patient declined - Yes - Educational materials given    Current Medications (verified) Outpatient Encounter Medications as of 07/20/2018  Medication Sig  . ASPIRIN LOW DOSE 81 MG EC tablet TAKE 1 TABLET BY MOUTH DAILY  . carbidopa-levodopa (SINEMET IR) 25-100 MG tablet TAKE 1 TABLET BY MOUTH THREE TIMES DAILY  . cyanocobalamin (,VITAMIN B-12,) 1000 MCG/ML injection Inject into the muscle.  . cyclobenzaprine (FLEXERIL) 10 MG tablet Take 1 tablet (10 mg total) by mouth 3 (three) times daily as needed for muscle spasms.  . Dulaglutide (TRULICITY) 1.5 VE/7.2CN SOPN INJECT 1.5 MG into THE SKIN ONCE WEEKLY  . DULoxetine (CYMBALTA) 60 MG capsule TAKE ONE CAPSULE BY MOUTH EVERY DAY  . fluconazole (DIFLUCAN) 150 MG tablet TAKE ONE TABLET BY MOUTH every WEEK FOR FOUR WEEKS  . GLOBAL EASE INJECT PEN NEEDLES 31G X 5 MM MISC USE FOUR TIMES DAILY  . glucose blood (ONETOUCH VERIO) test strip Test bid, DX E11.40  . insulin glargine (LANTUS) 100 UNIT/ML injection Inject 0.2 mLs (20 Units total) into the skin at bedtime.  .  Insulin Lispro (HUMALOG KWIKPEN) 200 UNIT/ML SOPN Inject 3-20 Units into the skin 4 (four) times daily -  before meals and at bedtime.  . Lancets (ONETOUCH ULTRASOFT) lancets CHECK DAILY AND AS NEEDED  . metFORMIN (GLUCOPHAGE) 500 MG tablet TAKE TWO TABLETS BY MOUTH EVERY DAY  . OXcarbazepine (TRILEPTAL) 150 MG tablet TAKE ONE TABLET BY MOUTH EVERY MORNING AND TAKE TWO TABLETS BY MOUTH AT BEDTIME  . oxyCODONE-acetaminophen (PERCOCET) 10-325 MG tablet Take 1 tablet by mouth every 8 (eight) hours as needed for pain.  . pregabalin (LYRICA) 150 MG capsule TAKE 1 CAPSULE BY MOUTH THREE TIMES DAILY  . traMADol-acetaminophen (ULTRACET) 37.5-325 MG tablet Take by mouth.  . [DISCONTINUED] oxyCODONE-acetaminophen (PERCOCET) 10-325 MG tablet Take 1 tablet by mouth every 8 (eight) hours as needed for pain.  . [DISCONTINUED] oxyCODONE-acetaminophen (PERCOCET) 10-325 MG tablet Take 1 tablet by mouth every 8 (eight) hours as needed for pain.  . [DISCONTINUED] UNIFINE PENTIPS 31G X 6 MM MISC    Facility-Administered Encounter Medications as of 07/20/2018  Medication  . cyanocobalamin ((VITAMIN B-12)) injection 1,000 mcg  . cyanocobalamin ((VITAMIN B-12)) injection 1,000 mcg    Allergies (verified) Actos [pioglitazone] and Invokana [canagliflozin]   History: Past Medical History:  Diagnosis Date  . Anxiety   . Attention deficit disorder (ADD)   . Depression   . Diabetes ([place])   . Hiatal hernia   .  Movement disorder   . Neuropathy   . Schatzki's ring   . Sleep apnea    Past Surgical History:  Procedure Laterality Date  . ABDOMINAL HYSTERECTOMY    . BREAST LUMPECTOMY WITH RADIOACTIVE SEED LOCALIZATION Right 04/25/2016   Procedure: RIGHT BREAST LUMPECTOMY WITH RADIOACTIVE SEED LOCALIZATION;  Surgeon: Autumn Messing III, MD;  Location: [facility];  Service: General;  Laterality: Right;  . CHOLECYSTECTOMY    . RECTOCELE REPAIR    . TOE AMPUTATION Right    2nd toe rt foot  . TUBAL LIGATION      Family History  Problem Relation Age of Onset  . Stroke Mother   . Diabetes Unknown   . Heart Problems Unknown   . Colon cancer Neg Hx    Social History   Socioeconomic History  . Marital status: Married    Spouse name: [name]  . Number of children: 3  . Years of education: college  . Highest education level: Not on file  Occupational History    Employer: [employer]: [employer]  . Financial resource strain: Not on file  . Food insecurity:    Worry: Not on file    Inability: Not on file  . Transportation needs:    Medical: Not on file    Non-medical: Not on file  Tobacco Use  . Smoking status: Never Smoker  . Smokeless tobacco: Never Used  Substance and Sexual Activity  . Alcohol use: No    Alcohol/week: 0.0 standard drinks  . Drug use: No  . Sexual activity: Not on file  Lifestyle  . Physical activity:    Days per week: Not on file    Minutes per session: Not on file  . Stress: Not on file  Relationships  . Social connections:    Talks on phone: Not on file    Gets together: Not on file    Attends religious service: Not on file    Active member of club or organization: Not on file    Attends meetings of clubs or organizations: Not on file    Relationship status: Not on file  Other Topics Concern  . Not on file  Social History Narrative   Patient lives at home with her husband. [name]).   Patient works for [employer].    Education- College   Right handed.   Caffeine- tea one cup daily.          Clinical Intake:  Activities of Daily Living In your present state of health, do you have any difficulty performing the following activities: 07/20/2018  Hearing? N  Vision? N  Difficulty concentrating or making decisions? Y  Comment slightly  Walking or climbing stairs? N  Dressing or bathing? N  Doing errands, shopping? N  Preparing Food and eating ? N  Using the Toilet? N  In the past  six months, have you accidently leaked urine? N  Do you have problems with loss of bowel control? N  Managing your Medications? N  Managing your Finances? N  Housekeeping or managing your Housekeeping? N  Some recent data might be hidden   Rhonda Thomas states she sometimes will have slight difficulty remembering the names of things, but this is only mild and does not happen often.  Immunizations and Health Maintenance Immunization History  Administered Date(s) Administered  . Influenza, Seasonal, Injecte, Preservative Fre 09/28/2015  . Influenza,inj,Quad PF,6+ Mos 09/17/2014, 11/29/2016, 09/29/2017  .  Influenza-Unspecified 09/10/2013  . Pneumococcal Conjugate-13 11/07/2017  . Tdap 05/31/2017  . Zoster 06/23/2015   Health Maintenance Due  Topic Date Due  . PNEUMOCOCCAL POLYSACCHARIDE VACCINE (1) 08/10/1958  . PAP SMEAR  08/10/1977  . OPHTHALMOLOGY EXAM  03/13/2015  . FOOT EXAM  11/29/2017  . MAMMOGRAM  03/08/2018  . INFLUENZA VACCINE  07/12/2018    Patient Care Team: Theodoro Clock as PCP - General (Physician Assistant)  Indicate any recent Medical Services you may have received from other than [facility] providers in the past year (date may be approximate).     Assessment:   This is a routine wellness examination for [employer].   Dietary issues and exercise activities discussed: Current Exercise Habits: Home exercise routine, Type of exercise: walking, Time (Minutes): 30, Frequency (Times/Week): 7, Weekly Exercise (Minutes/Week): 210, Intensity: Mild, Exercise limited by: None identified  Goals    . DIET - REDUCE SODIUM INTAKE    . Follow up with Primary Care Provider      Depression Screen [facility] 2/9 Scores 07/20/2018 06/05/2018 04/13/2018 11/07/2017 10/02/2017 08/25/2017 05/31/2017  PHQ - 2 Score 2 0 2 2 2  0 0  PHQ- 9 Score 10 - 7 8 8  - -  Exception Documentation - - - - - - -  Not completed - - - - - - -    Fall Risk Fall Risk  11/07/2017 10/02/2017 05/31/2017 02/25/2017  12/26/2016  Falls in the past year? No No No No No  Number falls in past yr: - - - - -  Injury with Fall? - - - - -    Is the patient's home free of loose throw rugs in walkways, pet beds, electrical cords, etc?   Yes      Grab bars in the bathroom? No      Handrails on the stairs?   Yes      Adequate lighting?   Yes   Cognitive Function: MMSE - Mini Mental State Exam 07/20/2018  Orientation to time 5  Orientation to Place 5  Registration 3  Attention/ Calculation 5  Recall 3  Language- name 2 objects 2  Language- repeat 1  Language- follow 3 step command 3  Language- read & follow direction 1  Write a sentence 1  Copy design 1  Total score 30    Patient scored well on MMSE, scoring 30 out of 30 available points.    Screening Tests Health Maintenance  Topic Date Due  . PNEUMOCOCCAL POLYSACCHARIDE VACCINE (1) 08/10/1958  . PAP SMEAR  08/10/1977  . OPHTHALMOLOGY EXAM  03/13/2015  . FOOT EXAM  11/29/2017  . MAMMOGRAM  03/08/2018  . INFLUENZA VACCINE  07/12/2018  . URINE MICROALBUMIN  11/07/2018  . HEMOGLOBIN A1C  12/05/2018  . COLONOSCOPY  08/24/2022  . TETANUS/TDAP  06/01/2027  . Hepatitis C Screening  Completed  . HIV Screening  Completed      Cancer Screenings: Lung: Low Dose CT Chest recommended if Age 53-80 years, 30 pack-year currently smoking OR have quit w/in 15years. Patient does not qualify. Colorectal: Yes    Plan:     Follow up with PCP Reduce sodium intake Increase water consumption  I have personally reviewed and noted the following in the patient's chart:   . Medical and social history . Use of alcohol, tobacco or illicit drugs  . Current medications and supplements . Functional ability and status . Nutritional status . Physical activity . Advanced directives . List of other physicians . Hospitalizations,  surgeries, and ER visits in previous 12 months . Vitals . Screenings to include cognitive, depression, and falls . Referrals and  appointments  In addition, I have reviewed and discussed with patient certain preventive protocols, quality metrics, and best practice recommendations. A written personalized care plan for preventive services as well as general preventive health recommendations were provided to patient.     Burnadette Pop, LPN   12/20/1476   I have reviewed and agree with the above AWV documentation.   Terald Sleeper PA-C [facility] [address]  [place], [place] [address] [phone]

## 2018-07-23 ENCOUNTER — Other Ambulatory Visit: Payer: Self-pay

## 2018-07-23 MED ORDER — INSULIN PEN NEEDLE 31G X 5 MM MISC: 9 refills | Status: DC

## 2018-08-07 ENCOUNTER — Ambulatory Visit (INDEPENDENT_AMBULATORY_CARE_PROVIDER_SITE_OTHER): Payer: Medicare Other | Admitting: *Deleted

## 2018-08-07 DIAGNOSIS — E538 Deficiency of other specified B group vitamins: Secondary | ICD-10-CM | POA: Diagnosis not present

## 2018-08-07 NOTE — Progress Notes (Signed)
Pt given Cyanocobalamin inj Tolerated well 

## 2018-08-10 ENCOUNTER — Encounter: Payer: Self-pay | Admitting: Psychology

## 2018-08-10 ENCOUNTER — Encounter: Payer: Medicare Other | Attending: Psychology | Admitting: Psychology

## 2018-08-10 DIAGNOSIS — E1140 Type 2 diabetes mellitus with diabetic neuropathy, unspecified: Secondary | ICD-10-CM | POA: Insufficient documentation

## 2018-08-10 DIAGNOSIS — F339 Major depressive disorder, recurrent, unspecified: Secondary | ICD-10-CM | POA: Insufficient documentation

## 2018-08-10 DIAGNOSIS — F331 Major depressive disorder, recurrent, moderate: Secondary | ICD-10-CM | POA: Diagnosis not present

## 2018-08-10 DIAGNOSIS — F411 Generalized anxiety disorder: Secondary | ICD-10-CM | POA: Insufficient documentation

## 2018-08-10 NOTE — Progress Notes (Signed)
Patient ID: Rhonda Thomas, female   DOB: 1956-04-29, 62 y.o.   MRN: [id number] [name]   DOB: 01/11/1956  MR Number: [id number]  Location: [facility] PHYSICAL MEDICINE AND REHABILITATION [place], [place] [id number] [place] [address] Dept: [phone]  Start: 10 AM End: 11 AM  Provider/Observer:     Edgardo Roys PsyD  Chief Complaint:      Chief Complaint  Patient presents with  . Depression  . Anxiety  . Stress    Reason For Service:     I have worked with the patient for several years now. She was initially referred here for difficulty coping with the situation involving her children. Her youngest son is at severe difficulties over the years with regard to mood disorder, substance abuse and behavioral problems. Now there are major stressors associated with him again the patient has been essentially overwhelmed and unsure about what to do about. She reports that this has created a lot of stress both at home and at work. On top of that, her diabetes has gotten to the point that she had hammertoe amputated because of that. She has neuropathy as a result of diabetes as well and has had to stay out of her foot for 8 weeks.  The psychosocial stressors are having significant negative impact upon her severe diabetes with neuropathy and loss of toe.  The above reason for service has been reviewed and remains applicable.  The patient still having a lot of stress particularly around issues with her son.  She is continued to struggle with resulting problems with her feet from her diabetes.  Interventions Strategy:  Cognitive/behavioral psychotherapeutic interventions.  Participation Level:   Active  Participation Quality:  Appropriate      Behavioral Observation:  Well Groomed, Alert, and Depressed.   Current Psychosocial Factors: The patient reports that she has had further medical workups on her  feet and has been trying to improve an infection on her toe so she could have another surgery on her foot.  The patient reports that she is struggling with wearing 2 boots.  The patient reports that her son moved out of the house is returned but has been able to maintain a job that is causing less stress but continues to be problematic.  Content of Session:   Review current symptoms and continued work on building improve coping skills  Current Status:   The patient reports that she has been able to finish her spinal cord stimulator surgery and reports that she is experiencing a 50-60% improvement in her pain levels.     Last Reviewed:   08/10/2018  Goals Addressed Today:    Goals addressed included building better coping skills.  Impression/Diagnosis:   The patient has a long history of attention deficit disorder but due to Maj. psychosocial stressors she also developed clinical depression and anxiety. I think that her attentional problems were valid prior to the development of these issues that existed her entire life. However, she is in and repeat if and recurrent trauma with regard primarily to her son and to her daughter to a lesser degree in years past.  Diagnosis:    Axis I:  Major depressive disorder, recurrent episode, moderate ([place])  Type 2 diabetes mellitus with diabetic neuropathy, unspecified whether long term insulin use ([place])  Generalized anxiety disorder            Edgardo Roys, PsyD 08/10/2018

## 2018-08-24 ENCOUNTER — Other Ambulatory Visit: Payer: Self-pay | Admitting: Physician Assistant

## 2018-08-24 DIAGNOSIS — L97521 Non-pressure chronic ulcer of other part of left foot limited to breakdown of skin: Secondary | ICD-10-CM

## 2018-09-03 ENCOUNTER — Ambulatory Visit: Payer: Medicare Other | Admitting: Physician Assistant

## 2018-09-03 ENCOUNTER — Encounter: Payer: Self-pay | Admitting: Physician Assistant

## 2018-09-03 VITALS — BP 118/73 | HR 91 | Temp 96.9°F | Ht 69.0 in | Wt 193.0 lb

## 2018-09-03 DIAGNOSIS — E1140 Type 2 diabetes mellitus with diabetic neuropathy, unspecified: Secondary | ICD-10-CM

## 2018-09-03 DIAGNOSIS — E1142 Type 2 diabetes mellitus with diabetic polyneuropathy: Secondary | ICD-10-CM

## 2018-09-03 DIAGNOSIS — M5137 Other intervertebral disc degeneration, lumbosacral region: Secondary | ICD-10-CM

## 2018-09-03 DIAGNOSIS — E538 Deficiency of other specified B group vitamins: Secondary | ICD-10-CM | POA: Diagnosis not present

## 2018-09-03 DIAGNOSIS — G2581 Restless legs syndrome: Secondary | ICD-10-CM | POA: Diagnosis not present

## 2018-09-03 DIAGNOSIS — L97521 Non-pressure chronic ulcer of other part of left foot limited to breakdown of skin: Secondary | ICD-10-CM

## 2018-09-03 LAB — BAYER DCA HB A1C WAIVED: HB A1C (BAYER DCA - WAIVED): 7.6 % — ABNORMAL HIGH (ref <7.0)

## 2018-09-03 MED ORDER — CYANOCOBALAMIN 1000 MCG/ML IJ SOLN
1000.0000 ug | Freq: Once | INTRAMUSCULAR | Status: AC
  Administered 2018-10-11: 1000 ug via INTRAMUSCULAR

## 2018-09-03 MED ORDER — OXYCODONE-ACETAMINOPHEN 10-325 MG PO TABS: 1.0000 | ORAL_TABLET | Freq: Three times a day (TID) | ORAL | 0 refills | Status: DC | PRN

## 2018-09-03 MED ORDER — DULOXETINE HCL 60 MG PO CPEP: 60.0000 mg | ORAL_CAPSULE | Freq: Every day | ORAL | 0 refills | Status: DC

## 2018-09-03 MED ORDER — FREESTYLE SYSTEM KIT: PACK | 11 refills | Status: DC

## 2018-09-03 MED ORDER — CARBIDOPA-LEVODOPA 25-100 MG PO TABS: 1.0000 | ORAL_TABLET | Freq: Three times a day (TID) | ORAL | 11 refills | Status: DC

## 2018-09-03 MED ORDER — METFORMIN HCL 500 MG PO TABS: 1000.0000 mg | ORAL_TABLET | Freq: Every day | ORAL | 11 refills | Status: DC

## 2018-09-03 MED ORDER — INSULIN GLARGINE 100 UNIT/ML ~~LOC~~ SOLN: 20.0000 [IU] | Freq: Every day | SUBCUTANEOUS | 3 refills | Status: DC

## 2018-09-03 MED ORDER — PREGABALIN 150 MG PO CAPS: ORAL_CAPSULE | ORAL | 5 refills | Status: DC

## 2018-09-03 MED ORDER — OXYCODONE-ACETAMINOPHEN 10-325 MG PO TABS
1.0000 | ORAL_TABLET | Freq: Three times a day (TID) | ORAL | 0 refills | Status: DC | PRN
Start: 2018-09-03 — End: 2018-11-30

## 2018-09-03 MED ORDER — OXCARBAZEPINE 150 MG PO TABS: ORAL_TABLET | ORAL | 5 refills | Status: DC

## 2018-09-03 NOTE — Progress Notes (Signed)
BP 118/73   Pulse 91   Temp (!) 96.9 F (36.1 C) (Oral)   Ht _0  (1.753 m)   Wt 193 lb (87.5 kg)   BMI 28.50 kg/m     Subjective:    Patient ID: Rhonda Thomas, female    DOB: December 26, 1955, 62 y.o.   MRN: [id number]  HPI: Rhonda Thomas is a 62 y.o. female presenting on 09/03/2018 for Diabetes (3 month follow up) and Hyperlipidemia  Patient comes in for recheck on her diabetes, restless legs, degenerative disc disease.  She has had a little more surgery performed for her foot.  She still seeing her podiatrist for this.  She has had a heel breakdown that she is being required to lay in bed always.  This is making her back significantly worse at times.  Labs will be updated. Chronic pain is under control, patient has no new complaints. Medications are keeping things stable. Needs refills for the next three months.   Pain assessment: Cause of pain-ddd, neuropathic damage  Pain on scale of 1-10- 10 Frequency- What increases pain-standing What makes pain Better-rest Effects on ADL - severe Any change in general medical condition-none  Current medications- oxycodone Effectiveness of current meds-good Adverse reactions form pain meds-none  Pill count performed-No Urine drug screen- No Was the [place] reviewed- yes  If yes were their any concerning findings? - no  Past Medical History:  Diagnosis Date  . Anxiety   . Attention deficit disorder (ADD)   . Depression   . Diabetes ([place])   . Hiatal hernia   . Movement disorder   . Neuropathy   . Schatzki's ring   . Sleep apnea    Relevant past medical, surgical, family and social history reviewed and updated as indicated. Interim medical history since our last visit reviewed. Allergies and medications reviewed and updated. DATA REVIEWED: CHART IN EPIC  Family History reviewed for pertinent findings.  Review of Systems  Constitutional: Negative.   HENT: Negative.   Eyes: Negative.   Respiratory: Negative.   Gastrointestinal:  Negative.   Genitourinary: Negative.   Musculoskeletal: Positive for arthralgias, back pain, gait problem, joint swelling and myalgias.    Allergies as of 09/03/2018      Reactions   Actos [pioglitazone]    Edema   Invokana [canagliflozin]    vaginitis      Medication List        Accurate as of 09/03/18 10:34 PM. Always use your most recent med list.          ASPIRIN LOW DOSE 81 MG EC tablet Generic drug:  aspirin TAKE 1 TABLET BY MOUTH DAILY   carbidopa-levodopa 25-100 MG tablet Commonly known as:  SINEMET IR Take 1 tablet by mouth 3 (three) times daily.   cyanocobalamin 1000 MCG/ML injection Commonly known as:  (VITAMIN B-12) Inject into the muscle.   Dulaglutide 1.5 MG/0.5ML Sopn INJECT 1.5 MG into THE SKIN ONCE WEEKLY   DULoxetine 60 MG capsule Commonly known as:  CYMBALTA Take 1 capsule (60 mg total) by mouth daily. (Needs to be seen)   fluconazole 150 MG tablet Commonly known as:  DIFLUCAN TAKE ONE TABLET BY MOUTH every WEEK FOR FOUR WEEKS   glucose monitoring kit monitoring kit Check glucose 4 times per day for insulin dependent diabetes with complications. Fill test strips #100 and Lancets #100 with PRN refills.   insulin glargine 100 UNIT/ML injection Commonly known as:  LANTUS Inject 0.2 mLs (20 Units total) into the skin  at bedtime.   Insulin Lispro 200 UNIT/ML Sopn Inject 3-20 Units into the skin 4 (four) times daily -  before meals and at bedtime.   Insulin Pen Needle 31G X 5 MM Misc USE FOUR TIMES DAILY   metFORMIN 500 MG tablet Commonly known as:  GLUCOPHAGE Take 2 tablets (1,000 mg total) by mouth daily.   OXcarbazepine 150 MG tablet Commonly known as:  TRILEPTAL TAKE ONE TABLET BY MOUTH EVERY MORNING AND TAKE TWO TABLETS BY MOUTH AT BEDTIME   oxyCODONE-acetaminophen 10-325 MG tablet Commonly known as:  PERCOCET Take 1 tablet by mouth every 8 (eight) hours as needed for pain.   oxyCODONE-acetaminophen 10-325 MG tablet Commonly known  as:  PERCOCET Take 1 tablet by mouth every 8 (eight) hours as needed for pain.   oxyCODONE-acetaminophen 10-325 MG tablet Commonly known as:  PERCOCET Take 1 tablet by mouth every 8 (eight) hours as needed for pain.   pregabalin 150 MG capsule Commonly known as:  LYRICA TAKE 1 CAPSULE BY MOUTH THREE TIMES DAILY   ULTRACET 37.5-325 MG tablet Generic drug:  traMADol-acetaminophen Take by mouth.          Objective:    BP 118/73   Pulse 91   Temp (!) 96.9 F (36.1 C) (Oral)   Ht '5\' 9"'$  (1.753 m)   Wt 193 lb (87.5 kg)   BMI 28.50 kg/m    Allergies  Allergen Reactions  . Actos [Pioglitazone]     Edema   . Invokana [Canagliflozin]     vaginitis    Wt Readings from Last 3 Encounters:  09/03/18 193 lb (87.5 kg)  07/20/18 193 lb (87.5 kg)  06/05/18 193 lb 12.8 oz (87.9 kg)    Physical Exam  Constitutional: She is oriented to person, place, and time. [name].  HENT:  Head: Normocephalic and atraumatic.  Eyes: Pupils are equal, round, and reactive to light. Conjunctivae and EOM are normal.  Cardiovascular: Normal rate, regular rhythm, normal heart sounds and intact distal pulses.  Pulmonary/Chest: Effort normal and breath sounds normal.  Abdominal: Soft. Bowel sounds are normal.  Neurological: She is alert and oriented to person, place, and time. She has normal reflexes.  Skin: Skin is warm and dry. No rash noted.  Psychiatric: She has a normal mood and affect. [name]. Judgment and thought content normal.    Results for orders placed or performed in visit on 09/03/18  Bayer DCA Hb A1c Waived  Result Value Ref Range   HB A1C (BAYER DCA - WAIVED) 7.6 (H) <7.0 %      Assessment & Plan:   1. Restless legs syndrome (RLS) - carbidopa-levodopa (SINEMET IR) 25-100 MG tablet; Take 1 tablet by mouth 3 (three) times daily.  Dispense: 90 tablet; Refill: 11  2. DDD (degenerative disc disease), lumbosacral -  oxyCODONE-acetaminophen (PERCOCET) 10-325 MG tablet; Take 1 tablet by mouth every 8 (eight) hours as needed for pain.  Dispense: 90 tablet; Refill: 0 - oxyCODONE-acetaminophen (PERCOCET) 10-325 MG tablet; Take 1 tablet by mouth every 8 (eight) hours as needed for pain.  Dispense: 90 tablet; Refill: 0 - oxyCODONE-acetaminophen (PERCOCET) 10-325 MG tablet; Take 1 tablet by mouth every 8 (eight) hours as needed for pain.  Dispense: 90 tablet; Refill: 0  3. Type 2 diabetes mellitus with diabetic neuropathy, unspecified whether long term insulin use (HCC) - pregabalin (LYRICA) 150 MG capsule; TAKE 1 CAPSULE BY MOUTH THREE TIMES DAILY  Dispense: 90 capsule; Refill: 5 -  insulin glargine (LANTUS) 100 UNIT/ML injection; Inject 0.2 mLs (20 Units total) into the skin at bedtime.  Dispense: 45 mL; Refill: 3 - cyanocobalamin ((VITAMIN B-12)) injection 1,000 mcg - CMP14+EGFR - CBC with Differential/Platelet - Lipid panel - Bayer DCA Hb A1c Waived  4. Diabetic peripheral neuropathy (HCC) - OXcarbazepine (TRILEPTAL) 150 MG tablet; TAKE ONE TABLET BY MOUTH EVERY MORNING AND TAKE TWO TABLETS BY MOUTH AT BEDTIME  Dispense: 90 tablet; Refill: 5 - CMP14+EGFR - CBC with Differential/Platelet - Lipid panel - Bayer DCA Hb A1c Waived  5. Skin ulcer of left foot, limited to breakdown of skin (HCC) - DULoxetine (CYMBALTA) 60 MG capsule; Take 1 capsule (60 mg total) by mouth daily. (Needs to be seen)  Dispense: 30 capsule; Refill: 0  6. B12 deficiency - cyanocobalamin ((VITAMIN B-12)) injection 1,000 mcg   Continue all other maintenance medications as listed above.  Follow up plan: No follow-ups on file.  Educational handout given for medical survey reminder  Terald Sleeper PA-C [place] [address]  [place], [place] [address] [phone]   09/03/2018, 10:34 PM

## 2018-09-04 ENCOUNTER — Other Ambulatory Visit: Payer: Self-pay | Admitting: Physician Assistant

## 2018-09-04 LAB — CBC WITH DIFFERENTIAL/PLATELET
Basophils Absolute: 0 10*3/uL (ref 0.0–0.2)
Basos: 0 %
EOS (ABSOLUTE): 0.3 10*3/uL (ref 0.0–0.4)
Eos: 4 %
Hematocrit: 40 % (ref 34.0–46.6)
Hemoglobin: 13.3 g/dL (ref 11.1–15.9)
Immature Grans (Abs): 0 10*3/uL (ref 0.0–0.1)
Immature Granulocytes: 0 %
Lymphocytes Absolute: 2.8 10*3/uL (ref 0.7–3.1)
Lymphs: 37 %
MCH: 29.1 pg (ref 26.6–33.0)
MCHC: 33.3 g/dL (ref 31.5–35.7)
MCV: 88 fL (ref 79–97)
Monocytes Absolute: 0.6 10*3/uL (ref 0.1–0.9)
Monocytes: 8 %
Neutrophils Absolute: 3.8 10*3/uL (ref 1.4–7.0)
Neutrophils: 51 %
Platelets: 281 10*3/uL (ref 150–450)
RBC: 4.57 x10E6/uL (ref 3.77–5.28)
RDW: 14.7 % (ref 12.3–15.4)
WBC: 7.5 10*3/uL (ref 3.4–10.8)

## 2018-09-04 LAB — CMP14+EGFR
ALT: 5 IU/L (ref 0–32)
AST: 8 IU/L (ref 0–40)
Albumin/Globulin Ratio: 1.6 (ref 1.2–2.2)
Albumin: 3.9 g/dL (ref 3.6–4.8)
Alkaline Phosphatase: 97 IU/L (ref 39–117)
BUN/Creatinine Ratio: 18 (ref 12–28)
BUN: 15 mg/dL (ref 8–27)
Bilirubin Total: 0.4 mg/dL (ref 0.0–1.2)
CO2: 26 mmol/L (ref 20–29)
Calcium: 9.5 mg/dL (ref 8.7–10.3)
Chloride: 101 mmol/L (ref 96–106)
Creatinine, Ser: 0.82 mg/dL (ref 0.57–1.00)
GFR calc Af Amer: 89 mL/min/{1.73_m2} (ref >59)
GFR calc non Af Amer: 77 mL/min/{1.73_m2} (ref >59)
Globulin, Total: 2.4 g/dL (ref 1.5–4.5)
Glucose: 138 mg/dL — ABNORMAL HIGH (ref 65–99)
Potassium: 4.8 mmol/L (ref 3.5–5.2)
Sodium: 141 mmol/L (ref 134–144)
Total Protein: 6.3 g/dL (ref 6.0–8.5)

## 2018-09-04 LAB — LIPID PANEL
Chol/HDL Ratio: 4.1 ratio (ref 0.0–4.4)
Cholesterol, Total: 202 mg/dL — ABNORMAL HIGH (ref 100–199)
HDL: 49 mg/dL (ref >39)
LDL Calculated: 103 mg/dL — ABNORMAL HIGH (ref 0–99)
Triglycerides: 248 mg/dL — ABNORMAL HIGH (ref 0–149)
VLDL Cholesterol Cal: 50 mg/dL — ABNORMAL HIGH (ref 5–40)

## 2018-09-04 MED ORDER — ROSUVASTATIN CALCIUM 5 MG PO TABS: 5.0000 mg | ORAL_TABLET | Freq: Every day | ORAL | 1 refills | Status: DC

## 2018-09-15 ENCOUNTER — Other Ambulatory Visit: Payer: Self-pay | Admitting: Physician Assistant

## 2018-09-17 NOTE — Telephone Encounter (Signed)
Last seen 09/03/18  Glenard Haring

## 2018-09-21 ENCOUNTER — Encounter

## 2018-09-21 ENCOUNTER — Ambulatory Visit: Payer: Self-pay | Admitting: Psychology

## 2018-10-03 ENCOUNTER — Other Ambulatory Visit: Payer: Self-pay | Admitting: Family Medicine

## 2018-10-03 DIAGNOSIS — E1140 Type 2 diabetes mellitus with diabetic neuropathy, unspecified: Secondary | ICD-10-CM

## 2018-10-11 ENCOUNTER — Ambulatory Visit (INDEPENDENT_AMBULATORY_CARE_PROVIDER_SITE_OTHER): Payer: Medicare Other | Admitting: *Deleted

## 2018-10-11 DIAGNOSIS — Z23 Encounter for immunization: Secondary | ICD-10-CM | POA: Diagnosis not present

## 2018-10-11 DIAGNOSIS — E538 Deficiency of other specified B group vitamins: Secondary | ICD-10-CM | POA: Diagnosis not present

## 2018-10-11 NOTE — Progress Notes (Signed)
Pt given cyanocobalamin inj and flu vaccine Tolerated well 

## 2018-10-14 ENCOUNTER — Other Ambulatory Visit: Payer: Self-pay | Admitting: Physician Assistant

## 2018-10-14 DIAGNOSIS — E785 Hyperlipidemia, unspecified: Secondary | ICD-10-CM

## 2018-10-22 ENCOUNTER — Encounter: Payer: Medicare Other | Attending: Psychology | Admitting: Psychology

## 2018-10-22 DIAGNOSIS — F411 Generalized anxiety disorder: Secondary | ICD-10-CM

## 2018-10-22 DIAGNOSIS — F339 Major depressive disorder, recurrent, unspecified: Secondary | ICD-10-CM | POA: Insufficient documentation

## 2018-10-22 DIAGNOSIS — E1140 Type 2 diabetes mellitus with diabetic neuropathy, unspecified: Secondary | ICD-10-CM | POA: Insufficient documentation

## 2018-10-22 DIAGNOSIS — F331 Major depressive disorder, recurrent, moderate: Secondary | ICD-10-CM

## 2018-10-25 ENCOUNTER — Other Ambulatory Visit: Payer: Self-pay | Admitting: Physician Assistant

## 2018-10-25 DIAGNOSIS — L97521 Non-pressure chronic ulcer of other part of left foot limited to breakdown of skin: Secondary | ICD-10-CM

## 2018-11-04 ENCOUNTER — Encounter: Payer: Self-pay | Admitting: Psychology

## 2018-11-04 NOTE — Progress Notes (Signed)
Patient ID: Rhonda Thomas, female   DOB: Apr 17, 1956, 62 y.o.   MRN: [id number] [name]   DOB: 10-10-1956  MR Number: [id number]  Location: [facility] FOR PAIN AND [facility] MEDICINE [facility] PHYSICAL MEDICINE AND REHABILITATION [place], [place] [id number] [place] [id number] Dept: [phone]  Start: 1 PM End: 2 PM  Provider/Observer:     Edgardo Roys PsyD  Chief Complaint:      Chief Complaint  Patient presents with  . Anxiety  . Pain  . Stress  . Depression    Reason For Service:     I have worked with the patient for several years now. She was initially referred here for difficulty coping with the situation involving her children. Her youngest son is at severe difficulties over the years with regard to mood disorder, substance abuse and behavioral problems. Now there are major stressors associated with him again the patient has been essentially overwhelmed and unsure about what to do about. She reports that this has created a lot of stress both at home and at work. On top of that, her diabetes has gotten to the point that she had hammertoe amputated because of that. She has neuropathy as a result of diabetes as well and has had to stay out of her foot for 8 weeks.  The psychosocial stressors are having significant negative impact upon her severe diabetes with neuropathy and loss of toe.  The above reason for service has been reviewed and remains applicable.  The patient is still having a lot of stress particular and issues with her son.  She has continued to struggle with complications from her diabetes with her feet.  Interventions Strategy:  Cognitive/behavioral psychotherapeutic interventions and working on issues related to her stress associated with her brittle diabetes.  Interventions.  Participation Level:   Active  Participation Quality:  Appropriate      Behavioral Observation:  Well Groomed, Alert, and Depressed.    Current Psychosocial Factors: The patient reports that she has continued to have some significant stressors associated with her son.  However, he has now moved out of their house and moved back in with an old girlfriend.  While this is helped the day-to-day stressors around the house it does worry her as the ex-girlfriend is associated with substance abuse in the past and her son is now been clean for about 4 to 6 months.  Content of Session:   Reviewed current symptoms and continue to work on building and improving coping skills.  Current Status:   The patient reports that she has been able to finish her spinal cord stimulator surgery and reports that she is experiencing a 50-60% improvement in her pain levels.     Last Reviewed:   10/22/2018  Goals Addressed Today:    Goals addressed included building better coping skills.  Impression/Diagnosis:   The patient has a long history of attention deficit disorder but due to Maj. psychosocial stressors she also developed clinical depression and anxiety. I think that her attentional problems were valid prior to the development of these issues that existed her entire life. However, she is in and repeat if and recurrent trauma with regard primarily to her son and to her daughter to a lesser degree in years past.  Diagnosis:    Axis I:  Major depressive disorder, recurrent episode, moderate ([place])  Type 2 diabetes mellitus with diabetic neuropathy, unspecified whether long term insulin use (HCC)  Generalized anxiety disorder  Edgardo Roys, PsyD 11/04/2018

## 2018-11-05 ENCOUNTER — Encounter: Payer: Self-pay | Admitting: Physician Assistant

## 2018-11-06 ENCOUNTER — Other Ambulatory Visit: Payer: Self-pay | Admitting: Physician Assistant

## 2018-11-06 DIAGNOSIS — N644 Mastodynia: Secondary | ICD-10-CM

## 2018-11-06 DIAGNOSIS — Z1239 Encounter for other screening for malignant neoplasm of breast: Secondary | ICD-10-CM

## 2018-11-06 DIAGNOSIS — R928 Other abnormal and inconclusive findings on diagnostic imaging of breast: Secondary | ICD-10-CM

## 2018-11-11 ENCOUNTER — Other Ambulatory Visit: Payer: Self-pay | Admitting: Physician Assistant

## 2018-11-11 DIAGNOSIS — E1140 Type 2 diabetes mellitus with diabetic neuropathy, unspecified: Secondary | ICD-10-CM

## 2018-11-14 ENCOUNTER — Ambulatory Visit
Admission: RE | Admit: 2018-11-14 | Discharge: 2018-11-14 | Disposition: A | Discharge status: Home / Self Care | Payer: Medicare Other | Source: Ambulatory Visit | Attending: Physician Assistant | Admitting: Physician Assistant

## 2018-11-14 ENCOUNTER — Ambulatory Visit: Payer: Self-pay

## 2018-11-14 DIAGNOSIS — N644 Mastodynia: Secondary | ICD-10-CM

## 2018-11-16 ENCOUNTER — Encounter (HOSPITAL_BASED_OUTPATIENT_CLINIC_OR_DEPARTMENT_OTHER): Payer: Medicare Other | Attending: Internal Medicine

## 2018-11-23 ENCOUNTER — Other Ambulatory Visit: Payer: Self-pay | Admitting: Physician Assistant

## 2018-11-23 DIAGNOSIS — L97521 Non-pressure chronic ulcer of other part of left foot limited to breakdown of skin: Secondary | ICD-10-CM

## 2018-11-23 NOTE — Telephone Encounter (Signed)
Needs to be seen

## 2018-11-30 ENCOUNTER — Encounter: Payer: Self-pay | Admitting: Physician Assistant

## 2018-11-30 ENCOUNTER — Ambulatory Visit (INDEPENDENT_AMBULATORY_CARE_PROVIDER_SITE_OTHER): Payer: Medicare Other | Admitting: Physician Assistant

## 2018-11-30 VITALS — BP 120/66 | HR 82 | Temp 98.4°F | Ht 69.0 in | Wt 196.4 lb

## 2018-11-30 DIAGNOSIS — E538 Deficiency of other specified B group vitamins: Secondary | ICD-10-CM | POA: Diagnosis not present

## 2018-11-30 DIAGNOSIS — L97521 Non-pressure chronic ulcer of other part of left foot limited to breakdown of skin: Secondary | ICD-10-CM | POA: Diagnosis not present

## 2018-11-30 DIAGNOSIS — M5137 Other intervertebral disc degeneration, lumbosacral region: Secondary | ICD-10-CM

## 2018-11-30 DIAGNOSIS — E1049 Type 1 diabetes mellitus with other diabetic neurological complication: Secondary | ICD-10-CM

## 2018-11-30 DIAGNOSIS — E1140 Type 2 diabetes mellitus with diabetic neuropathy, unspecified: Secondary | ICD-10-CM

## 2018-11-30 DIAGNOSIS — Z794 Long term (current) use of insulin: Secondary | ICD-10-CM

## 2018-11-30 LAB — BAYER DCA HB A1C WAIVED: HB A1C (BAYER DCA - WAIVED): 7.5 % — ABNORMAL HIGH (ref <7.0)

## 2018-11-30 MED ORDER — DULOXETINE HCL 60 MG PO CPEP: 60.0000 mg | ORAL_CAPSULE | Freq: Every day | ORAL | 11 refills | Status: DC

## 2018-11-30 MED ORDER — INSULIN LISPRO 200 UNIT/ML ~~LOC~~ SOPN: 3.0000 [IU] | PEN_INJECTOR | Freq: Three times a day (TID) | SUBCUTANEOUS | 5 refills | Status: DC

## 2018-11-30 MED ORDER — ROSUVASTATIN CALCIUM 5 MG PO TABS: 5.0000 mg | ORAL_TABLET | Freq: Every day | ORAL | 3 refills | Status: DC

## 2018-11-30 MED ORDER — OXYCODONE-ACETAMINOPHEN 10-325 MG PO TABS: 1.0000 | ORAL_TABLET | Freq: Three times a day (TID) | ORAL | 0 refills | Status: DC | PRN

## 2018-11-30 MED ORDER — INSULIN GLARGINE 100 UNIT/ML ~~LOC~~ SOLN: SUBCUTANEOUS | 11 refills | Status: DC

## 2018-11-30 MED ORDER — DULAGLUTIDE 1.5 MG/0.5ML ~~LOC~~ SOAJ: SUBCUTANEOUS | 6 refills | Status: DC

## 2018-11-30 MED ORDER — INSULIN PEN NEEDLE 31G X 5 MM MISC: 9 refills | Status: DC

## 2018-11-30 NOTE — Patient Instructions (Signed)
Breakfast and lunch -  BG less than 80: none,  BG 80-120: 8 units,  BG 121-170: 9 units,  BG 171-220: 10 units,  BG 221-270: 11 units,  BG 271 - 300: 12 units,  BG 301 or greater: 13 units  Supper - BG less than 80: none,  BG 80-120: 5 units,  BG 121-170: 6 units,  BG 171-220: 7 units,  BG 221-270: 8 units,  BG 271 - 300: 9 units,  BG 301 or greater: 10 units

## 2018-12-02 ENCOUNTER — Encounter: Payer: Self-pay | Admitting: Physician Assistant

## 2018-12-02 NOTE — Progress Notes (Signed)
BP 120/66   Pulse 82   Temp 98.4 F (36.9 C) (Oral)   Ht _0  (1.753 m)   Wt 196 lb 6.4 oz (89.1 kg)   BMI 29.00 kg/m    Subjective:    Patient ID: Rhonda Thomas, female    DOB: Jul 02, 1956, 62 y.o.   MRN: [id number]  HPI: Precilla Thomas is a 62 y.o. female presenting on 11/30/2018 for Diabetes and Depression (medication refill)    Past Medical History:  Diagnosis Date  . Anxiety   . Attention deficit disorder (ADD)   . Depression   . Diabetes ([place])   . Hiatal hernia   . Movement disorder   . Neuropathy   . Schatzki's ring   . Sleep apnea    Relevant past medical, surgical, family and social history reviewed and updated as indicated. Interim medical history since our last visit reviewed. Allergies and medications reviewed and updated. DATA REVIEWED: CHART IN EPIC  Family History reviewed for pertinent findings.  Review of Systems  Constitutional: Negative.  Negative for activity change, fatigue and fever.  HENT: Negative.   Eyes: Negative.   Respiratory: Negative.  Negative for cough.   Cardiovascular: Negative.  Negative for chest pain.  Gastrointestinal: Negative.  Negative for abdominal pain.  Endocrine: Negative.   Genitourinary: Negative.  Negative for dysuria.  Musculoskeletal: Positive for arthralgias, back pain and gait problem.  Skin: Negative.   Neurological: Positive for weakness and numbness.    Allergies as of 11/30/2018      Reactions   Actos [pioglitazone]    Edema   Invokana [canagliflozin]    vaginitis      Medication List       Accurate as of November 30, 2018 11:59 PM. Always use your most recent med list.        ASPIRIN LOW DOSE 81 MG EC tablet Generic drug:  aspirin TAKE 1 TABLET BY MOUTH DAILY   carbidopa-levodopa 25-100 MG tablet Commonly known as:  SINEMET IR Take 1 tablet by mouth 3 (three) times daily.   cyanocobalamin 1000 MCG/ML injection Commonly known as:  (VITAMIN B-12) Inject into the muscle.     cyclobenzaprine 10 MG tablet Commonly known as:  FLEXERIL TAKE 1 TABLET BY MOUTH THREE TIMES DAILY AS NEEDED FOR MUSCLE SPASMS   Dulaglutide 1.5 MG/0.5ML Sopn Commonly known as:  TRULICITY inject 1.5 MG into SKIN ONCE EVERY WEEK   DULoxetine 60 MG capsule Commonly known as:  CYMBALTA Take 1 capsule (60 mg total) by mouth daily.   fluconazole 150 MG tablet Commonly known as:  DIFLUCAN TAKE ONE TABLET BY MOUTH every WEEK FOR FOUR WEEKS   glucose monitoring kit monitoring kit Check glucose 4 times per day for insulin dependent diabetes with complications. Fill test strips #100 and Lancets #100 with PRN refills.   insulin glargine 100 UNIT/ML injection Commonly known as:  LANTUS INJECT 20 UNITS INTO SKIN AT BEDTIME   Insulin Lispro 200 UNIT/ML Sopn Commonly known as:  HUMALOG KWIKPEN Inject 3-20 Units into the skin 4 (four) times daily -  before meals and at bedtime.   Insulin Pen Needle 31G X 5 MM Misc Commonly known as:  GLOBAL EASE INJECT PEN NEEDLES USE FOUR TIMES DAILY   metFORMIN 500 MG tablet Commonly known as:  GLUCOPHAGE Take 2 tablets (1,000 mg total) by mouth daily.   OXcarbazepine 150 MG tablet Commonly known as:  TRILEPTAL TAKE ONE TABLET BY MOUTH EVERY MORNING AND TAKE TWO TABLETS BY  MOUTH AT BEDTIME   oxyCODONE-acetaminophen 10-325 MG tablet Commonly known as:  PERCOCET Take 1 tablet by mouth every 8 (eight) hours as needed for pain.   oxyCODONE-acetaminophen 10-325 MG tablet Commonly known as:  PERCOCET Take 1 tablet by mouth every 8 (eight) hours as needed for pain.   oxyCODONE-acetaminophen 10-325 MG tablet Commonly known as:  PERCOCET Take 1 tablet by mouth every 8 (eight) hours as needed for pain.   pregabalin 150 MG capsule Commonly known as:  LYRICA TAKE 1 CAPSULE BY MOUTH THREE TIMES DAILY   rosuvastatin 5 MG tablet Commonly known as:  CRESTOR Take 1 tablet (5 mg total) by mouth at bedtime.   ULTRACET 37.5-325 MG tablet Generic drug:   traMADol-acetaminophen Take by mouth.          Objective:    BP 120/66   Pulse 82   Temp 98.4 F (36.9 C) (Oral)   Ht _0  (1.753 m)   Wt 196 lb 6.4 oz (89.1 kg)   BMI 29.00 kg/m   Allergies  Allergen Reactions  . Actos [Pioglitazone]     Edema   . Invokana [Canagliflozin]     vaginitis    Wt Readings from Last 3 Encounters:  11/30/18 196 lb 6.4 oz (89.1 kg)  09/03/18 193 lb (87.5 kg)  07/20/18 193 lb (87.5 kg)    Physical Exam Constitutional:      Appearance: She is well-developed.  HENT:     Head: Normocephalic and atraumatic.  Eyes:     Conjunctiva/sclera: Conjunctivae normal.     Pupils: Pupils are equal, round, and reactive to light.  Cardiovascular:     Rate and Rhythm: Normal rate and regular rhythm.     Heart sounds: Normal heart sounds.  Pulmonary:     Effort: Pulmonary effort is normal.     Breath sounds: Normal breath sounds.  Abdominal:     General: Bowel sounds are normal.     Palpations: Abdomen is soft.  Skin:    General: Skin is warm and dry.     Findings: No rash.  Neurological:     Mental Status: She is alert and oriented to person, place, and time.     Deep Tendon Reflexes: Reflexes are normal and symmetric.  Psychiatric:        Behavior: Behavior normal.        Thought Content: Thought content normal.        Judgment: Judgment normal.     Results for orders placed or performed in visit on 11/30/18  Bayer DCA Hb A1c Waived  Result Value Ref Range   HB A1C (BAYER DCA - WAIVED) 7.5 (H) <7.0 %      Assessment & Plan:   1. Type 1 diabetes mellitus with other neurologic complication (HCC) - Bayer DCA Hb A1c Waived - Insulin Lispro (HUMALOG KWIKPEN) 200 UNIT/ML SOPN; Inject 3-20 Units into the skin 4 (four) times daily -  before meals and at bedtime.  Dispense: 15 mL; Refill: 5 - Insulin Pen Needle (GLOBAL EASE INJECT PEN NEEDLES) 31G X 5 MM MISC; USE FOUR TIMES DAILY  Dispense: 100 each; Refill: 9 - insulin glargine (LANTUS) 100  UNIT/ML injection; INJECT 20 UNITS INTO SKIN AT BEDTIME  Dispense: 45 mL; Refill: 11 - Dulaglutide (TRULICITY) 1.5 KD/3.2IZ SOPN; inject 1.5 MG into SKIN ONCE EVERY WEEK  Dispense: 2 mL; Refill: 6  2. Vitamin B12 deficiency b12 injection today  3. Skin ulcer of left foot, limited to breakdown of  skin (HCC) - DULoxetine (CYMBALTA) 60 MG capsule; Take 1 capsule (60 mg total) by mouth daily.  Dispense: 30 capsule; Refill: 11  4. Type 2 diabetes mellitus with diabetic neuropathy, with long-term current use of insulin (HCC) - insulin glargine (LANTUS) 100 UNIT/ML injection; INJECT 20 UNITS INTO SKIN AT BEDTIME  Dispense: 45 mL; Refill: 11  5. DDD (degenerative disc disease), lumbosacral - oxyCODONE-acetaminophen (PERCOCET) 10-325 MG tablet; Take 1 tablet by mouth every 8 (eight) hours as needed for pain.  Dispense: 90 tablet; Refill: 0 - oxyCODONE-acetaminophen (PERCOCET) 10-325 MG tablet; Take 1 tablet by mouth every 8 (eight) hours as needed for pain.  Dispense: 90 tablet; Refill: 0 - oxyCODONE-acetaminophen (PERCOCET) 10-325 MG tablet; Take 1 tablet by mouth every 8 (eight) hours as needed for pain.  Dispense: 90 tablet; Refill: 0   Continue all other maintenance medications as listed above.  Follow up plan: Return in about 4 weeks (around 12/28/2018) for B12, monthly, schedule. 3 months me.  Educational handout given for [place] PA-C [place] [address]  [place], [place] [address] [phone]   12/02/2018, 8:52 PM

## 2018-12-03 ENCOUNTER — Other Ambulatory Visit: Payer: Self-pay | Admitting: Physician Assistant

## 2018-12-03 ENCOUNTER — Encounter: Payer: Medicare Other | Admitting: Psychology

## 2018-12-03 ENCOUNTER — Encounter

## 2018-12-03 MED ORDER — NALOXEGOL OXALATE 25 MG PO TABS: 25.0000 mg | ORAL_TABLET | Freq: Every day | ORAL | 5 refills | Status: DC

## 2018-12-03 NOTE — Progress Notes (Signed)
Patient has tried and failed MiraLAX, polyethylene glycol, Linzess, Amitiza.  Movantik has been the only thing that has worked if she is taking it over the past year.

## 2018-12-06 ENCOUNTER — Telehealth: Payer: Self-pay

## 2018-12-06 NOTE — Telephone Encounter (Signed)
Rhonda Thomas to address when she returns

## 2018-12-06 NOTE — Telephone Encounter (Signed)
Glenard Haring PCP   Insurance denied prior [place] for [employer]

## 2018-12-13 NOTE — Telephone Encounter (Signed)
Would she want to try Linzess or Amitiza again?

## 2018-12-14 NOTE — Telephone Encounter (Signed)
Pt states she has a new insurance and she will give her new insurance information to the pharmacy to see if the [facility] will go through with the new insurance.

## 2018-12-19 DIAGNOSIS — S92032D Displaced avulsion fracture of tuberosity of left calcaneus, subsequent encounter for fracture with routine healing: Secondary | ICD-10-CM | POA: Diagnosis not present

## 2018-12-19 DIAGNOSIS — E11621 Type 2 diabetes mellitus with foot ulcer: Secondary | ICD-10-CM | POA: Diagnosis not present

## 2018-12-19 DIAGNOSIS — L97409 Non-pressure chronic ulcer of unspecified heel and midfoot with unspecified severity: Secondary | ICD-10-CM | POA: Diagnosis not present

## 2018-12-25 ENCOUNTER — Other Ambulatory Visit: Payer: Self-pay | Admitting: Physician Assistant

## 2018-12-28 ENCOUNTER — Encounter: Payer: PPO | Attending: Psychology | Admitting: Psychology

## 2018-12-28 DIAGNOSIS — F331 Major depressive disorder, recurrent, moderate: Secondary | ICD-10-CM | POA: Insufficient documentation

## 2018-12-28 DIAGNOSIS — E1140 Type 2 diabetes mellitus with diabetic neuropathy, unspecified: Secondary | ICD-10-CM | POA: Diagnosis not present

## 2018-12-28 DIAGNOSIS — E1142 Type 2 diabetes mellitus with diabetic polyneuropathy: Secondary | ICD-10-CM

## 2018-12-28 DIAGNOSIS — F339 Major depressive disorder, recurrent, unspecified: Secondary | ICD-10-CM | POA: Insufficient documentation

## 2018-12-28 DIAGNOSIS — F411 Generalized anxiety disorder: Secondary | ICD-10-CM | POA: Diagnosis not present

## 2018-12-31 DIAGNOSIS — S92032D Displaced avulsion fracture of tuberosity of left calcaneus, subsequent encounter for fracture with routine healing: Secondary | ICD-10-CM | POA: Diagnosis not present

## 2019-01-01 ENCOUNTER — Encounter: Payer: Self-pay | Admitting: Physician Assistant

## 2019-01-06 ENCOUNTER — Encounter: Payer: Self-pay | Admitting: Psychology

## 2019-01-06 NOTE — Progress Notes (Signed)
Patient ID: Rhonda Thomas, female   DOB: June 21, 1956, 63 y.o.   MRN: [id number] [name]   DOB: 04/29/1956  MR Number: [id number]  Location: [facility] FOR PAIN AND [facility] MEDICINE [facility] PHYSICAL MEDICINE AND REHABILITATION [place], [place] [id number] [place] [address] Dept: [phone]  Start: 1 PM End: 2 PM  Provider/Observer:     Edgardo Roys PsyD  Chief Complaint:      Chief Complaint  Patient presents with  . Stress  . Anxiety  . Depression  . Pain  . Sleeping Problem    Reason For Service:     I have worked with the patient for several years now. She was initially referred here for difficulty coping with the situation involving her children. Her youngest son is at severe difficulties over the years with regard to mood disorder, substance abuse and behavioral problems. Now there are major stressors associated with him again the patient has been essentially overwhelmed and unsure about what to do about. She reports that this has created a lot of stress both at home and at work. On top of that, her diabetes has gotten to the point that she had hammertoe amputated because of that. She has neuropathy as a result of diabetes as well and has had to stay out of her foot for 8 weeks.  The psychosocial stressors are having significant negative impact upon her severe diabetes with neuropathy and loss of toe.  The above reason for service has been reviewed and remains applicable.  The patient is still having a lot of stress particular and issues with her son.  She has continued to struggle with complications from her diabetes with her feet.  Interventions Strategy:  Cognitive/behavioral psychotherapeutic interventions and working on issues related to her stress associated with her brittle diabetes.  Interventions.  Participation Level:   Active  Participation Quality:  Appropriate      Behavioral  Observation:  Well Groomed, Alert, and Depressed.   Current Psychosocial Factors: The patient reports that she has continued to have some significant stressors associated with her son.  However, he has now moved out of their house and moved back in with an old girlfriend.  While this is helped the day-to-day stressors around the house it does worry her as the ex-girlfriend is associated with substance abuse in the past and her son is now been clean for about 4 to 6 months.  Content of Session:   Reviewed current symptoms and continue to work on building and improving coping skills.  Current Status:   The patient reports that she has been able to finish her spinal cord stimulator surgery and reports that she is experiencing a 50-60% improvement in her pain levels.     Last Reviewed:   01/01/2019  Goals Addressed Today:    Goals addressed included building better coping skills.  Impression/Diagnosis:   The patient has a long history of attention deficit disorder but due to Maj. psychosocial stressors she also developed clinical depression and anxiety. I think that her attentional problems were valid prior to the development of these issues that existed her entire life. However, she is in and repeat if and recurrent trauma with regard primarily to her son and to her daughter to a lesser degree in years past.  Diagnosis:    Axis I:  Major depressive disorder, recurrent episode, moderate ([place])  Type 2 diabetes mellitus with diabetic neuropathy, unspecified whether long term insulin  use [facility])  Generalized anxiety disorder  Diabetic peripheral neuropathy [facility])            Edgardo Roys, PsyD 01/06/2019

## 2019-01-09 DIAGNOSIS — L97402 Non-pressure chronic ulcer of unspecified heel and midfoot with fat layer exposed: Secondary | ICD-10-CM | POA: Diagnosis not present

## 2019-01-09 DIAGNOSIS — S92032D Displaced avulsion fracture of tuberosity of left calcaneus, subsequent encounter for fracture with routine healing: Secondary | ICD-10-CM | POA: Diagnosis not present

## 2019-01-09 DIAGNOSIS — E11621 Type 2 diabetes mellitus with foot ulcer: Secondary | ICD-10-CM | POA: Diagnosis not present

## 2019-01-11 ENCOUNTER — Other Ambulatory Visit: Payer: Self-pay | Admitting: Physician Assistant

## 2019-01-11 DIAGNOSIS — E785 Hyperlipidemia, unspecified: Secondary | ICD-10-CM

## 2019-01-16 ENCOUNTER — Ambulatory Visit (INDEPENDENT_AMBULATORY_CARE_PROVIDER_SITE_OTHER): Payer: PPO

## 2019-01-16 ENCOUNTER — Ambulatory Visit (INDEPENDENT_AMBULATORY_CARE_PROVIDER_SITE_OTHER): Payer: PPO | Admitting: Physician Assistant

## 2019-01-16 ENCOUNTER — Encounter: Payer: Self-pay | Admitting: Physician Assistant

## 2019-01-16 VITALS — BP 118/70 | HR 95 | Temp 97.2°F | Ht 69.0 in | Wt 201.4 lb

## 2019-01-16 DIAGNOSIS — S6992XA Unspecified injury of left wrist, hand and finger(s), initial encounter: Secondary | ICD-10-CM

## 2019-01-16 DIAGNOSIS — S63502A Unspecified sprain of left wrist, initial encounter: Secondary | ICD-10-CM

## 2019-01-16 DIAGNOSIS — M25532 Pain in left wrist: Secondary | ICD-10-CM | POA: Diagnosis not present

## 2019-01-16 DIAGNOSIS — E538 Deficiency of other specified B group vitamins: Secondary | ICD-10-CM | POA: Diagnosis not present

## 2019-01-18 NOTE — Progress Notes (Signed)
BP 118/70   Pulse 95   Temp (!) 97.2 F (36.2 C) (Oral)   Ht _0  (1.753 m)   Wt 201 lb 6 oz (91.3 kg)   BMI 29.74 kg/m    Subjective:    Patient ID: Rhonda Thomas, female    DOB: 1956-11-22, 63 y.o.   MRN: [id number]  HPI: Rhonda Thomas is a 63 y.o. female presenting on 01/16/2019 for thumb pain (pt here today c/o left thumb pain after falling up steps a week ago) She has pain in the left thumb since she fell landing forward on her hand. There is decreased ROM and swelling.    Past Medical History:  Diagnosis Date  . Anxiety   . Attention deficit disorder (ADD)   . Depression   . Diabetes ([place])   . Hiatal hernia   . Movement disorder   . Neuropathy   . Schatzki's ring   . Sleep apnea    Relevant past medical, surgical, family and social history reviewed and updated as indicated. Interim medical history since our last visit reviewed. Allergies and medications reviewed and updated. DATA REVIEWED: CHART IN EPIC  Family History reviewed for pertinent findings.  Review of Systems  Constitutional: Negative.   HENT: Negative.   Eyes: Negative.   Respiratory: Negative.   Gastrointestinal: Negative.   Genitourinary: Negative.   Musculoskeletal: Positive for arthralgias, joint swelling and myalgias.    Allergies as of 01/16/2019      Reactions   Actos [pioglitazone]    Edema   Invokana [canagliflozin]    vaginitis      Medication List       Accurate as of January 16, 2019 11:59 PM. Always use your most recent med list.        ASPIRIN LOW DOSE 81 MG EC tablet Generic drug:  aspirin TAKE 1 TABLET BY MOUTH DAILY   carbidopa-levodopa 25-100 MG tablet Commonly known as:  SINEMET IR Take 1 tablet by mouth 3 (three) times daily.   cyanocobalamin 1000 MCG/ML injection Commonly known as:  (VITAMIN B-12) Inject into the muscle.   cyclobenzaprine 10 MG tablet Commonly known as:  FLEXERIL TAKE 1 TABLET BY MOUTH THREE TIMES DAILY AS NEEDED FOR MUSCLE  SPASMS   Dulaglutide 1.5 MG/0.5ML Sopn Commonly known as:  TRULICITY inject 1.5 MG into SKIN ONCE EVERY WEEK   DULoxetine 60 MG capsule Commonly known as:  CYMBALTA Take 1 capsule (60 mg total) by mouth daily.   fluconazole 150 MG tablet Commonly known as:  DIFLUCAN TAKE ONE TABLET BY MOUTH every WEEK FOR FOUR WEEKS   glucose monitoring kit monitoring kit Check glucose 4 times per day for insulin dependent diabetes with complications. Fill test strips #100 and Lancets #100 with PRN refills.   insulin glargine 100 UNIT/ML injection Commonly known as:  LANTUS INJECT 20 UNITS INTO SKIN AT BEDTIME   Insulin Lispro 200 UNIT/ML Sopn Commonly known as:  HUMALOG KWIKPEN Inject 3-20 Units into the skin 4 (four) times daily -  before meals and at bedtime.   Insulin Pen Needle 31G X 5 MM Misc Commonly known as:  GLOBAL EASE INJECT PEN NEEDLES USE FOUR TIMES DAILY   metFORMIN 500 MG tablet Commonly known as:  GLUCOPHAGE Take 2 tablets (1,000 mg total) by mouth daily.   naloxegol oxalate 25 MG Tabs tablet Commonly known as:  MOVANTIK Take 1 tablet (25 mg total) by mouth daily.   OXcarbazepine 150 MG tablet Commonly known as:  TRILEPTAL  TAKE ONE TABLET BY MOUTH EVERY MORNING AND TAKE TWO TABLETS BY MOUTH AT BEDTIME   oxyCODONE-acetaminophen 10-325 MG tablet Commonly known as:  PERCOCET Take 1 tablet by mouth every 8 (eight) hours as needed for pain.   oxyCODONE-acetaminophen 10-325 MG tablet Commonly known as:  PERCOCET Take 1 tablet by mouth every 8 (eight) hours as needed for pain.   oxyCODONE-acetaminophen 10-325 MG tablet Commonly known as:  PERCOCET Take 1 tablet by mouth every 8 (eight) hours as needed for pain.   pregabalin 150 MG capsule Commonly known as:  LYRICA TAKE 1 CAPSULE BY MOUTH THREE TIMES DAILY   rosuvastatin 5 MG tablet Commonly known as:  CRESTOR Take 1 tablet (5 mg total) by mouth at bedtime.   ULTRACET 37.5-325 MG tablet Generic drug:   traMADol-acetaminophen Take by mouth.          Objective:    BP 118/70   Pulse 95   Temp (!) 97.2 F (36.2 C) (Oral)   Ht _0  (1.753 m)   Wt 201 lb 6 oz (91.3 kg)   BMI 29.74 kg/m   Allergies  Allergen Reactions  . Actos [Pioglitazone]     Edema   . Invokana [Canagliflozin]     vaginitis    Wt Readings from Last 3 Encounters:  01/16/19 201 lb 6 oz (91.3 kg)  11/30/18 196 lb 6.4 oz (89.1 kg)  09/03/18 193 lb (87.5 kg)    Physical Exam Constitutional:      Appearance: She is well-developed.  HENT:     Head: Normocephalic and atraumatic.  Eyes:     Conjunctiva/sclera: Conjunctivae normal.     Pupils: Pupils are equal, round, and reactive to light.  Cardiovascular:     Rate and Rhythm: Normal rate and regular rhythm.     Heart sounds: Normal heart sounds.  Pulmonary:     Effort: Pulmonary effort is normal.     Breath sounds: Normal breath sounds.  Abdominal:     General: Bowel sounds are normal.     Palpations: Abdomen is soft.  Musculoskeletal:     Left hand: She exhibits decreased range of motion, tenderness and swelling.       Hands:  Skin:    General: Skin is warm and dry.     Findings: No rash.  Neurological:     Mental Status: She is alert and oriented to person, place, and time.     Deep Tendon Reflexes: Reflexes are normal and symmetric.  Psychiatric:        Behavior: Behavior normal.        Thought Content: Thought content normal.        Judgment: Judgment normal.     Results for orders placed or performed in visit on 11/30/18  Bayer DCA Hb A1c Waived  Result Value Ref Range   HB A1C (BAYER DCA - WAIVED) 7.5 (H) <7.0 %      Assessment & Plan:   1. Vitamin B12 deficiency Injection given  2. Left wrist injury, initial encounter - DG Wrist Complete Left; Future  3. Wrist sprain, left, initial encounter - DG Wrist Complete Left; Future   Continue all other maintenance medications as listed above.  Follow up plan: Return if  symptoms worsen or fail to improve.  Educational handout given for [place] PA-C [facility] [address]  [place], [place] [address] [phone]   01/18/2019, 12:40 PM

## 2019-01-23 DIAGNOSIS — L97409 Non-pressure chronic ulcer of unspecified heel and midfoot with unspecified severity: Secondary | ICD-10-CM | POA: Diagnosis not present

## 2019-01-23 DIAGNOSIS — E11621 Type 2 diabetes mellitus with foot ulcer: Secondary | ICD-10-CM | POA: Diagnosis not present

## 2019-01-23 DIAGNOSIS — E1140 Type 2 diabetes mellitus with diabetic neuropathy, unspecified: Secondary | ICD-10-CM | POA: Diagnosis not present

## 2019-01-30 DIAGNOSIS — L97409 Non-pressure chronic ulcer of unspecified heel and midfoot with unspecified severity: Secondary | ICD-10-CM | POA: Diagnosis not present

## 2019-01-30 DIAGNOSIS — E11621 Type 2 diabetes mellitus with foot ulcer: Secondary | ICD-10-CM | POA: Diagnosis not present

## 2019-02-06 ENCOUNTER — Telehealth: Payer: Self-pay | Admitting: Physician Assistant

## 2019-02-07 DIAGNOSIS — G5601 Carpal tunnel syndrome, right upper limb: Secondary | ICD-10-CM | POA: Diagnosis not present

## 2019-02-07 DIAGNOSIS — M79644 Pain in right finger(s): Secondary | ICD-10-CM | POA: Diagnosis not present

## 2019-02-07 DIAGNOSIS — G5602 Carpal tunnel syndrome, left upper limb: Secondary | ICD-10-CM | POA: Diagnosis not present

## 2019-02-18 ENCOUNTER — Encounter: Payer: PPO | Attending: Psychology | Admitting: Psychology

## 2019-02-18 DIAGNOSIS — F339 Major depressive disorder, recurrent, unspecified: Secondary | ICD-10-CM | POA: Insufficient documentation

## 2019-02-18 DIAGNOSIS — F331 Major depressive disorder, recurrent, moderate: Secondary | ICD-10-CM | POA: Diagnosis not present

## 2019-02-18 DIAGNOSIS — E1142 Type 2 diabetes mellitus with diabetic polyneuropathy: Secondary | ICD-10-CM | POA: Diagnosis not present

## 2019-02-18 DIAGNOSIS — E1140 Type 2 diabetes mellitus with diabetic neuropathy, unspecified: Secondary | ICD-10-CM | POA: Insufficient documentation

## 2019-02-18 DIAGNOSIS — F411 Generalized anxiety disorder: Secondary | ICD-10-CM | POA: Diagnosis not present

## 2019-02-19 DIAGNOSIS — L97409 Non-pressure chronic ulcer of unspecified heel and midfoot with unspecified severity: Secondary | ICD-10-CM | POA: Diagnosis not present

## 2019-02-19 DIAGNOSIS — S92012D Displaced fracture of body of left calcaneus, subsequent encounter for fracture with routine healing: Secondary | ICD-10-CM | POA: Diagnosis not present

## 2019-02-19 DIAGNOSIS — E1140 Type 2 diabetes mellitus with diabetic neuropathy, unspecified: Secondary | ICD-10-CM | POA: Diagnosis not present

## 2019-02-19 DIAGNOSIS — E11621 Type 2 diabetes mellitus with foot ulcer: Secondary | ICD-10-CM | POA: Diagnosis not present

## 2019-02-22 ENCOUNTER — Encounter: Payer: Self-pay | Admitting: Physician Assistant

## 2019-02-22 ENCOUNTER — Telehealth: Payer: Self-pay | Admitting: Physician Assistant

## 2019-02-22 ENCOUNTER — Other Ambulatory Visit: Payer: Self-pay | Admitting: Physician Assistant

## 2019-02-22 MED ORDER — BASAGLAR KWIKPEN 100 UNIT/ML ~~LOC~~ SOPN: 20.0000 [IU] | PEN_INJECTOR | Freq: Every day | SUBCUTANEOUS | 5 refills | Status: DC

## 2019-02-22 NOTE — Telephone Encounter (Signed)
Pt is aware that RX was sent into the pharmacy and we do not have samples-PER Abigail Butts

## 2019-02-22 NOTE — Telephone Encounter (Signed)
PT is calling to see if we have samples of insulin glargine (LANTUS) 100 UNIT/ML injection, she states she sent a mychart message stating that insurance is not covering that anymore and named the one that the insurance will cover.

## 2019-02-24 ENCOUNTER — Encounter: Payer: Self-pay | Admitting: Psychology

## 2019-02-24 NOTE — Progress Notes (Signed)
Patient ID: Rhonda Thomas, female   DOB: 01/12/1956, 63 y.o.   MRN: [id number] [name]   DOB: 08/15/1956  MR Number: [id number]  Location: [facility] FOR PAIN AND [facility] MEDICINE [facility] PHYSICAL MEDICINE AND REHABILITATION [place], [facility] [id number] [place] [address] Dept: [phone]  Start: 1 PM End: 2 PM  Provider/Observer:     Edgardo Roys PsyD  Chief Complaint:      Chief Complaint  Patient presents with  . Pain  . Anxiety  . Stress    Reason For Service:     I have worked with the patient for several years now. She was initially referred here for difficulty coping with the situation involving her children. Her youngest son is at severe difficulties over the years with regard to mood disorder, substance abuse and behavioral problems. Now there are major stressors associated with him again the patient has been essentially overwhelmed and unsure about what to do about. She reports that this has created a lot of stress both at home and at work. On top of that, her diabetes has gotten to the point that she had hammertoe amputated because of that. She has neuropathy as a result of diabetes as well and has had to stay out of her foot for 8 weeks.  The psychosocial stressors are having significant negative impact upon her severe diabetes with neuropathy and loss of toe.  The above reason for service has been reviewed and remains applicable.  The patient is still having a lot of stress particular and issues with her son.  She has continued to struggle with complications from her diabetes with her feet.  Interventions Strategy:  Cognitive/behavioral psychotherapeutic interventions and working on issues related to her stress associated with her brittle diabetes.  Interventions.  Participation Level:   Active  Participation Quality:  Appropriate      Behavioral Observation:  Well Groomed, Alert, and Depressed.    Current Psychosocial Factors: The patient reports that she has continued to have some significant stressors associated with her son.  However, he has now moved out of their house and moved back in with an old girlfriend.  While this is helped the day-to-day stressors around the house it does worry her as the ex-girlfriend is associated with substance abuse in the past and her son is now been clean for about 4 to 6 months.  Content of Session:   Reviewed current symptoms and continue to work on building and improving coping skills.  Current Status:   The patient reports that she has been able to finish her spinal cord stimulator surgery and reports that she is experiencing a 50-60% improvement in her pain levels.     Last Reviewed:   02/15/2019  Goals Addressed Today:    Goals addressed included building better coping skills.  Impression/Diagnosis:   The patient has a long history of attention deficit disorder but due to Maj. psychosocial stressors she also developed clinical depression and anxiety. I think that her attentional problems were valid prior to the development of these issues that existed her entire life. However, she is in and repeat if and recurrent trauma with regard primarily to her son and to her daughter to a lesser degree in years past.  Diagnosis:    Axis I:  Major depressive disorder, recurrent episode, moderate ([place])  Type 2 diabetes mellitus with diabetic neuropathy, unspecified whether long term insulin use (HCC)  Generalized anxiety disorder  Diabetic peripheral neuropathy [facility])            Edgardo Roys, PsyD 02/24/2019

## 2019-02-25 ENCOUNTER — Other Ambulatory Visit: Payer: Self-pay | Admitting: Physician Assistant

## 2019-02-25 DIAGNOSIS — G5601 Carpal tunnel syndrome, right upper limb: Secondary | ICD-10-CM | POA: Diagnosis not present

## 2019-02-25 DIAGNOSIS — G5603 Carpal tunnel syndrome, bilateral upper limbs: Secondary | ICD-10-CM | POA: Diagnosis not present

## 2019-02-25 DIAGNOSIS — M5137 Other intervertebral disc degeneration, lumbosacral region: Secondary | ICD-10-CM

## 2019-02-25 DIAGNOSIS — G5602 Carpal tunnel syndrome, left upper limb: Secondary | ICD-10-CM | POA: Diagnosis not present

## 2019-02-26 ENCOUNTER — Encounter: Payer: Self-pay | Admitting: Physician Assistant

## 2019-02-26 NOTE — Telephone Encounter (Signed)
Patient has appointment on Thursday with Glenard Haring.

## 2019-02-28 ENCOUNTER — Encounter: Payer: Self-pay | Admitting: Physician Assistant

## 2019-02-28 ENCOUNTER — Other Ambulatory Visit: Payer: Self-pay

## 2019-02-28 ENCOUNTER — Ambulatory Visit (INDEPENDENT_AMBULATORY_CARE_PROVIDER_SITE_OTHER): Payer: PPO | Admitting: Physician Assistant

## 2019-02-28 DIAGNOSIS — E1140 Type 2 diabetes mellitus with diabetic neuropathy, unspecified: Secondary | ICD-10-CM | POA: Diagnosis not present

## 2019-02-28 DIAGNOSIS — E1142 Type 2 diabetes mellitus with diabetic polyneuropathy: Secondary | ICD-10-CM | POA: Diagnosis not present

## 2019-02-28 DIAGNOSIS — M5137 Other intervertebral disc degeneration, lumbosacral region: Secondary | ICD-10-CM | POA: Diagnosis not present

## 2019-02-28 MED ORDER — OXYCODONE-ACETAMINOPHEN 10-325 MG PO TABS: 1.0000 | ORAL_TABLET | Freq: Three times a day (TID) | ORAL | 0 refills | Status: DC | PRN

## 2019-02-28 MED ORDER — NALOXONE HCL 4 MG/0.1ML NA LIQD: 1.0000 | Freq: Once | NASAL | 1 refills | Status: AC

## 2019-02-28 MED ORDER — PREGABALIN 150 MG PO CAPS: ORAL_CAPSULE | ORAL | 5 refills | Status: DC

## 2019-02-28 MED ORDER — OXCARBAZEPINE 150 MG PO TABS: ORAL_TABLET | ORAL | 5 refills | Status: DC

## 2019-02-28 NOTE — Patient Instructions (Signed)
Coronavirus (COVID-19) Are you at risk?  Are you at risk for the Coronavirus (COVID-19)?  To be considered HIGH RISK for Coronavirus (COVID-19), you have to meet the following criteria:  . Traveled to [place], [place], [name], [place] or [place]; or in the [place] to [place], [place], [place], or [place]; and have fever, cough, and shortness of breath within the last 2 weeks of travel OR . Been in close contact with a person diagnosed with COVID-19 within the last 2 weeks and have fever, cough, and shortness of breath . IF YOU DO NOT MEET THESE CRITERIA, YOU ARE CONSIDERED LOW RISK FOR COVID-19.  What to do if you are HIGH RISK for COVID-19?  . If you are having a medical emergency, call 911. . Seek medical care right away. Before you go to a doctor's office, urgent care or emergency department, call ahead and tell them about your recent travel, contact with someone diagnosed with COVID-19, and your symptoms. You should receive instructions from your physician's office regarding next steps of care.  . When you arrive at healthcare provider, tell the healthcare staff immediately you have returned from visiting [place], [place], [place], [place] or [name]; or traveled in the [place] to [place], [place], [place], or [place]; in the last two weeks or you have been in close contact with a person diagnosed with COVID-19 in the last 2 weeks.   . Tell the health care staff about your symptoms: fever, cough and shortness of breath. . After you have been seen by a medical provider, you will be either: o Tested for (COVID-19) and discharged home on quarantine except to seek medical care if symptoms worsen, and asked to  - Stay home and avoid contact with others until you get your results (4-5 days)  - Avoid travel on public transportation if possible (such as bus, train, or airplane) or o Sent to the Emergency Department by EMS for evaluation, COVID-19 testing, and possible  admission depending on your condition and test results.  What to do if you are LOW RISK for COVID-19?  Reduce your risk of any infection by using the same precautions used for avoiding the common cold or flu:  . Wash your hands often with soap and warm water for at least 20 seconds.  If soap and water are not readily available, use an alcohol-based hand sanitizer with at least 60% alcohol.  . If coughing or sneezing, cover your mouth and nose by coughing or sneezing into the elbow areas of your shirt or coat, into a tissue or into your sleeve (not your hands). . Avoid shaking hands with others and consider head nods or verbal greetings only. . Avoid touching your eyes, nose, or mouth with unwashed hands.  . Avoid close contact with people who are sick. . Avoid places or events with large numbers of people in one location, like concerts or sporting events. . Carefully consider travel plans you have or are making. . If you are planning any travel outside or inside the [place], visit the CDC's Travelers' Health webpage for the latest health notices. . If you have some symptoms but not all symptoms, continue to monitor at home and seek medical attention if your symptoms worsen. . If you are having a medical emergency, call 911.   ADDITIONAL HEALTHCARE OPTIONS FOR PATIENTS  [facility] / e-Visit: [email or web address]         [facility]: [phone]  [facility]: [phone]                   [facility]: [phone]   

## 2019-02-28 NOTE — Progress Notes (Signed)
.   BP 118/74   Pulse 73   Temp (!) 97.3 F (36.3 C) (Oral)   Wt 200 lb 12.8 oz (91.1 kg)   BMI 29.65 kg/m    Subjective:    Patient ID: Rhonda Thomas, female    DOB: [birth year], 63 y.o.   MRN: [id number]  HPI: Rhonda Thomas is a 63 y.o. female presenting on 02/28/2019 for Pain (3 month )   PAIN ASSESSMENT: Cause of pain- DDD, peripheral neuropathy, heel ulcer surgery Closed displaced avulsion fracture of tuberosity of left calcaneus with routine healing  Patient actually ended up with some delayed healing and her podiatrist had to do several modifications.  Essentially she has been in a boot for about a year.  She is finally able to go every other day in her shoes and then a boot on the other days.  She is having a lot more pain because her walking and gait are abnormal.  Current medications-Lyrica 150 mg 3 times a day Percocet 10/325 1 every 8 hours for pain Medication side effects-none Any concerns-recovering her gait  Pain on scale of 1-10- 8 Frequency-Daily What increases pain-walking and retraining What makes pain Better-rest Effects on ADL -moderate Any change in general medical condition-no  Effectiveness of current meds- good Adverse reactions form pain meds-no PMP AWARE website reviewed: Yes Any suspicious activity on PMP Aware: No MME daily dose: 8 MME  Contract on file Last UDS  02/28/19 Narcan can be obtained from her pharmacy History of overdose or risk of abuse no   Past Medical History:  Diagnosis Date  . Anxiety   . Attention deficit disorder (ADD)   . Depression   . Diabetes ([place])   . Hiatal hernia   . Movement disorder   . Neuropathy   . Schatzki's ring   . Sleep apnea    Relevant past medical, surgical, family and social history reviewed and updated as indicated. Interim medical history since our last visit reviewed. Allergies and medications reviewed and updated. DATA REVIEWED: CHART IN EPIC  Family History reviewed for  pertinent findings.  Review of Systems  Constitutional: Negative.  Negative for activity change, fatigue and fever.  HENT: Negative.   Eyes: Negative.   Respiratory: Negative.  Negative for cough.   Cardiovascular: Negative.  Negative for chest pain.  Gastrointestinal: Negative.  Negative for abdominal pain.  Endocrine: Negative.   Genitourinary: Negative.  Negative for dysuria.  Musculoskeletal: Positive for arthralgias, back pain, myalgias and neck pain.  Skin: Negative.   Neurological: Positive for weakness.    Allergies as of 02/28/2019      Reactions   Actos [pioglitazone]    Edema   Invokana [canagliflozin]    vaginitis      Medication List       Accurate as of February 28, 2019  8:33 AM. Always use your most recent med list.        Aspirin Low Dose 81 MG EC tablet Generic drug:  aspirin TAKE 1 TABLET BY MOUTH DAILY   Basaglar KwikPen 100 UNIT/ML Sopn Inject 0.2 mLs (20 Units total) into the skin at bedtime.   carbidopa-levodopa 25-100 MG tablet Commonly known as:  SINEMET IR Take 1 tablet by mouth 3 (three) times daily.   cyanocobalamin 1000 MCG/ML injection Commonly known as:  (VITAMIN B-12) Inject into the muscle.   cyclobenzaprine 10 MG tablet Commonly known as:  FLEXERIL TAKE 1 TABLET BY MOUTH THREE TIMES DAILY AS NEEDED FOR MUSCLE SPASMS  Dulaglutide 1.5 MG/0.5ML Sopn Commonly known as:  Trulicity inject 1.5 MG into SKIN ONCE EVERY WEEK   DULoxetine 60 MG capsule Commonly known as:  CYMBALTA Take 1 capsule (60 mg total) by mouth daily.   fluconazole 150 MG tablet Commonly known as:  DIFLUCAN TAKE ONE TABLET BY MOUTH every WEEK FOR FOUR WEEKS   glucose monitoring kit monitoring kit Check glucose 4 times per day for insulin dependent diabetes with complications. Fill test strips #100 and Lancets #100 with PRN refills.   Insulin Lispro 200 UNIT/ML Sopn Commonly known as:  HumaLOG KwikPen Inject 3-20 Units into the skin 4 (four) times daily -   before meals and at bedtime.   Insulin Pen Needle 31G X 5 MM Misc Commonly known as:  Global Ease Inject Pen Needles USE FOUR TIMES DAILY   metFORMIN 500 MG tablet Commonly known as:  GLUCOPHAGE Take 2 tablets (1,000 mg total) by mouth daily.   naloxegol oxalate 25 MG Tabs tablet Commonly known as:  Movantik Take 1 tablet (25 mg total) by mouth daily.   OXcarbazepine 150 MG tablet Commonly known as:  TRILEPTAL TAKE ONE TABLET BY MOUTH EVERY MORNING AND TAKE TWO TABLETS BY MOUTH AT BEDTIME   oxyCODONE-acetaminophen 10-325 MG tablet Commonly known as:  PERCOCET Take 1 tablet by mouth every 8 (eight) hours as needed for pain.   oxyCODONE-acetaminophen 10-325 MG tablet Commonly known as:  PERCOCET Take 1 tablet by mouth every 8 (eight) hours as needed for pain.   oxyCODONE-acetaminophen 10-325 MG tablet Commonly known as:  PERCOCET Take 1 tablet by mouth every 8 (eight) hours as needed for pain.   pregabalin 150 MG capsule Commonly known as:  Lyrica TAKE 1 CAPSULE BY MOUTH THREE TIMES DAILY   rosuvastatin 5 MG tablet Commonly known as:  CRESTOR Take 1 tablet (5 mg total) by mouth at bedtime.   Ultracet 37.5-325 MG tablet Generic drug:  traMADol-acetaminophen Take by mouth.          Objective:    BP 118/74   Pulse 73   Temp (!) 97.3 F (36.3 C) (Oral)   Wt 200 lb 12.8 oz (91.1 kg)   BMI 29.65 kg/m   Allergies  Allergen Reactions  . Actos [Pioglitazone]     Edema   . Invokana [Canagliflozin]     vaginitis    Wt Readings from Last 3 Encounters:  02/28/19 200 lb 12.8 oz (91.1 kg)  01/16/19 201 lb 6 oz (91.3 kg)  11/30/18 196 lb 6.4 oz (89.1 kg)    Physical Exam Constitutional:      Appearance: She is well-developed.  HENT:     Head: Normocephalic and atraumatic.  Eyes:     Conjunctiva/sclera: Conjunctivae normal.     Pupils: Pupils are equal, round, and reactive to light.  Cardiovascular:     Rate and Rhythm: Normal rate and regular rhythm.      Heart sounds: Normal heart sounds.  Pulmonary:     Effort: Pulmonary effort is normal.     Breath sounds: Normal breath sounds.  Abdominal:     General: Bowel sounds are normal.     Palpations: Abdomen is soft.  Skin:    General: Skin is warm and dry.     Findings: No rash.  Neurological:     Mental Status: She is alert and oriented to person, place, and time.     Deep Tendon Reflexes: Reflexes are normal and symmetric.  Psychiatric:  Behavior: Behavior normal.        Thought Content: Thought content normal.        Judgment: Judgment normal.     Results for orders placed or performed in visit on 11/30/18  Bayer DCA Hb A1c Waived  Result Value Ref Range   HB A1C (BAYER DCA - WAIVED) 7.5 (H) <7.0 %      Assessment & Plan:   1. DDD (degenerative disc disease), lumbosacral - ToxASSURE Select 13 (MW), Urine - oxyCODONE-acetaminophen (PERCOCET) 10-325 MG tablet; Take 1 tablet by mouth every 8 (eight) hours as needed for pain.  Dispense: 90 tablet; Refill: 0 - oxyCODONE-acetaminophen (PERCOCET) 10-325 MG tablet; Take 1 tablet by mouth every 8 (eight) hours as needed for pain.  Dispense: 90 tablet; Refill: 0 - oxyCODONE-acetaminophen (PERCOCET) 10-325 MG tablet; Take 1 tablet by mouth every 8 (eight) hours as needed for pain.  Dispense: 90 tablet; Refill: 0  2. Diabetic peripheral neuropathy (HCC) - OXcarbazepine (TRILEPTAL) 150 MG tablet; TAKE ONE TABLET BY MOUTH EVERY MORNING AND TAKE TWO TABLETS BY MOUTH AT BEDTIME  Dispense: 90 tablet; Refill: 5  3. Type 2 diabetes mellitus with diabetic neuropathy, unspecified whether long term insulin use (HCC) - pregabalin (LYRICA) 150 MG capsule; TAKE 1 CAPSULE BY MOUTH THREE TIMES DAILY  Dispense: 90 capsule; Refill: 5   Continue all other maintenance medications as listed above.  Follow up plan: Follow up in 3 months and labs  Educational handout given for [place] PA-C [facility] [address]  [place], [place] [address] [phone]   02/28/2019, 8:33 AM

## 2019-03-04 LAB — TOXASSURE SELECT 13 (MW), URINE

## 2019-03-05 DIAGNOSIS — G5603 Carpal tunnel syndrome, bilateral upper limbs: Secondary | ICD-10-CM | POA: Diagnosis not present

## 2019-03-08 ENCOUNTER — Encounter (HOSPITAL_BASED_OUTPATIENT_CLINIC_OR_DEPARTMENT_OTHER): Payer: PPO | Admitting: Psychology

## 2019-03-08 ENCOUNTER — Other Ambulatory Visit: Payer: Self-pay

## 2019-03-08 ENCOUNTER — Encounter: Payer: Self-pay | Admitting: Psychology

## 2019-03-08 DIAGNOSIS — E1142 Type 2 diabetes mellitus with diabetic polyneuropathy: Secondary | ICD-10-CM | POA: Diagnosis not present

## 2019-03-08 DIAGNOSIS — F331 Major depressive disorder, recurrent, moderate: Secondary | ICD-10-CM

## 2019-03-08 DIAGNOSIS — F339 Major depressive disorder, recurrent, unspecified: Secondary | ICD-10-CM | POA: Diagnosis not present

## 2019-03-08 DIAGNOSIS — E1140 Type 2 diabetes mellitus with diabetic neuropathy, unspecified: Secondary | ICD-10-CM

## 2019-03-08 DIAGNOSIS — F411 Generalized anxiety disorder: Secondary | ICD-10-CM | POA: Diagnosis not present

## 2019-03-08 NOTE — Progress Notes (Signed)
Patient ID: Rhonda Thomas, female   DOB: 09/07/1956, 63 y.o.   MRN: [id number] [name]   DOB: 20-Jul-1956  MR Number: [id number]  Location: [facility] PHYSICAL MEDICINE AND REHABILITATION [place], [place] [id number] [place] [address] Dept: [phone]  Start: 10 AM End: 11 AM  Provider/Observer:     Edgardo Roys PsyD  Chief Complaint:      Chief Complaint  Patient presents with  . Anxiety  . Depression  . Stress  . Pain    Reason For Service:     I have worked with the patient for several years now. She was initially referred here for difficulty coping with the situation involving her children. Her youngest son is at severe difficulties over the years with regard to mood disorder, substance abuse and behavioral problems. Now there are major stressors associated with him again the patient has been essentially overwhelmed and unsure about what to do about. She reports that this has created a lot of stress both at home and at work. On top of that, her diabetes has gotten to the point that she had hammertoe amputated because of that. She has neuropathy as a result of diabetes as well and has had to stay out of her foot for 8 weeks.  The psychosocial stressors are having significant negative impact upon her severe diabetes with neuropathy and loss of toe.  The above reason for service has been reviewed and remains applicable the current visit.  The patient is continuing to have stress with her son but it has been improving a great deal recently.  The patient is also been able to stop wearing the boot now and regular diabetic shoes allowing her to be more active physically.  Interventions Strategy:  Cognitive/behavioral psychotherapeutic interventions and working on issues related to her stress associated with her brittle diabetes.  Participation Level:   Active  Participation  Quality:  Appropriate      Behavioral Observation:  Well Groomed, Alert, and Depressed.   Current Psychosocial Factors:   The patient reports that there have been some significant improvements with her son as he is started to take better care of himself including taking baths more frequently.  This is helped a great deal with the overall tension around the house.  Content of Session:   Reviewed current symptoms and continue to work on building and improving coping skills.  Current Status:   The patient reports that she has been able to finish her spinal cord stimulator surgery and reports that she is experiencing a 50-60% improvement in her pain levels.     Last Reviewed:   03/08/2019  Goals Addressed Today:    Goals addressed included building better coping skills.  Impression/Diagnosis:   The patient has a long history of attention deficit disorder but due to Maj. psychosocial stressors she also developed clinical depression and anxiety. I think that her attentional problems were valid prior to the development of these issues that existed her entire life. However, she is in and repeat if and recurrent trauma with regard primarily to her son and to her daughter to a lesser degree in years past.  Diagnosis:    Axis I:  Major depressive disorder, recurrent episode, moderate ([place])  Type 2 diabetes mellitus with diabetic neuropathy, unspecified whether long term insulin use ([place])  Generalized anxiety disorder  Diabetic peripheral neuropathy ([place])  Edgardo Roys, PsyD 03/08/2019

## 2019-03-21 ENCOUNTER — Telehealth: Payer: PPO | Admitting: Family

## 2019-03-21 DIAGNOSIS — J302 Other seasonal allergic rhinitis: Secondary | ICD-10-CM

## 2019-03-21 MED ORDER — FLUTICASONE PROPIONATE 50 MCG/ACT NA SUSP: 2.0000 | Freq: Every day | NASAL | 0 refills | Status: DC

## 2019-03-21 MED ORDER — LEVOCETIRIZINE DIHYDROCHLORIDE 5 MG PO TABS: 5.0000 mg | ORAL_TABLET | Freq: Every evening | ORAL | 0 refills | Status: DC

## 2019-03-21 NOTE — Progress Notes (Signed)
E visit for Allergic Rhinitis We are sorry that you are not feeling well.  Here is how we plan to help!  Based on what you have shared with me it looks like you have Allergic Rhinitis.  Rhinitis is when a reaction occurs that causes nasal congestion, runny nose, sneezing, and itching.  Most types of rhinitis are caused by an inflammation and are associated with symptoms in the eyes ears or throat. There are several types of rhinitis.  The most common are acute rhinitis, which is usually caused by a viral illness, allergic or seasonal rhinitis, and nonallergic or year-round rhinitis.  Nasal allergies occur certain times of the year.  Allergic rhinitis is caused when allergens in the air trigger the release of histamine in the body.  Histamine causes itching, swelling, and fluid to build up in the fragile linings of the nasal passages, sinuses and eyelids.  An itchy nose and clear discharge are common.  I recommend the following over the counter treatments: Xyzal 5 mg take 1 tablet daily  I also would recommend a nasal spray: Flonase 2 sprays into each nostril once daily  I have sent these to the pharmacy for you as a prescription.   HOME CARE:   You can use an over-the-counter saline nasal spray as needed  Avoid areas where there is heavy dust, mites, or molds  Stay indoors on windy days during the pollen season  Keep windows closed in home, at least in bedroom; use air conditioner.  Use high-efficiency house air filter  Keep windows closed in car, turn AC on re-circulate  Avoid playing out with dog during pollen season  GET HELP RIGHT AWAY IF:   If your symptoms do not improve within 10 days  You become short of breath  You develop yellow or green discharge from your nose for over 3 days  You have coughing fits  MAKE SURE YOU:   Understand these instructions  Will watch your condition  Will get help right away if you are not doing well or get worse  Thank you for  choosing an e-visit. Your e-visit answers were reviewed by a board certified advanced clinical practitioner to complete your personal care plan. Depending upon the condition, your plan could have included both over the counter or prescription medications. Please review your pharmacy choice. Be sure that the pharmacy you have chosen is open so that you can pick up your prescription now.  If there is a problem you may message your provider in [place] to have the prescription routed to another pharmacy. Your safety is important to [name]. If you have drug allergies check your prescription carefully.  For the next 24 hours, you can use MyChart to ask questions about today's visit, request a non-urgent call back, or ask for a work or school excuse from your e-visit provider. You will get an email in the next two days asking about your experience. I hope that your e-visit has been valuable and will speed your recovery.

## 2019-04-02 ENCOUNTER — Ambulatory Visit: Payer: BC Managed Care – PPO | Admitting: Psychology

## 2019-04-10 ENCOUNTER — Other Ambulatory Visit: Payer: Self-pay | Admitting: Physician Assistant

## 2019-04-10 DIAGNOSIS — E785 Hyperlipidemia, unspecified: Secondary | ICD-10-CM

## 2019-04-16 DIAGNOSIS — E11621 Type 2 diabetes mellitus with foot ulcer: Secondary | ICD-10-CM | POA: Diagnosis not present

## 2019-04-16 DIAGNOSIS — Z4789 Encounter for other orthopedic aftercare: Secondary | ICD-10-CM | POA: Diagnosis not present

## 2019-04-16 DIAGNOSIS — L97409 Non-pressure chronic ulcer of unspecified heel and midfoot with unspecified severity: Secondary | ICD-10-CM | POA: Diagnosis not present

## 2019-04-16 DIAGNOSIS — E1140 Type 2 diabetes mellitus with diabetic neuropathy, unspecified: Secondary | ICD-10-CM | POA: Diagnosis not present

## 2019-05-02 ENCOUNTER — Encounter: Payer: PPO | Admitting: Psychology

## 2019-05-17 DIAGNOSIS — Z5181 Encounter for therapeutic drug level monitoring: Secondary | ICD-10-CM | POA: Diagnosis not present

## 2019-05-17 DIAGNOSIS — E1140 Type 2 diabetes mellitus with diabetic neuropathy, unspecified: Secondary | ICD-10-CM | POA: Diagnosis not present

## 2019-05-17 DIAGNOSIS — G2581 Restless legs syndrome: Secondary | ICD-10-CM | POA: Diagnosis not present

## 2019-05-17 DIAGNOSIS — Z79899 Other long term (current) drug therapy: Secondary | ICD-10-CM | POA: Diagnosis not present

## 2019-05-20 ENCOUNTER — Ambulatory Visit (INDEPENDENT_AMBULATORY_CARE_PROVIDER_SITE_OTHER): Payer: PPO | Admitting: Physician Assistant

## 2019-05-20 ENCOUNTER — Encounter: Payer: Self-pay | Admitting: Physician Assistant

## 2019-05-20 ENCOUNTER — Other Ambulatory Visit: Payer: Self-pay

## 2019-05-20 DIAGNOSIS — M5137 Other intervertebral disc degeneration, lumbosacral region: Secondary | ICD-10-CM | POA: Diagnosis not present

## 2019-05-20 DIAGNOSIS — E1049 Type 1 diabetes mellitus with other diabetic neurological complication: Secondary | ICD-10-CM | POA: Diagnosis not present

## 2019-05-20 MED ORDER — DULAGLUTIDE 1.5 MG/0.5ML ~~LOC~~ SOAJ: SUBCUTANEOUS | 6 refills | Status: DC

## 2019-05-20 NOTE — Progress Notes (Signed)
Telephone visit  Subjective: CC: Chronic pain and neuropathy PCP: Terald Sleeper, PA-C GTX:MIWOE Rhonda Thomas is a 63 y.o. female calls for telephone consult today. Patient provides verbal consent for consult held via phone.  Patient is identified with 2 separate identifiers.  At this time the entire area is on COVID-19 social distancing and stay home orders are in place.  Patient is of higher risk and therefore we are performing this by a virtual method.  Location of patient: Home Location of provider: HOME Others present for call: No   Flexeril mg 3 times a day Duloxetine 60 mg 1 daily Percocet 10/325 1 every 8 hours severe pain Lyrica 150 mg 3 times a day (3 LME per PDMP)  We have had a discussion about the patient's chronic medication therapy.  She has been under the care of Dr. Prudy Feeler, podiatry in [place].  She has had longstanding diabetic foot issues, Charcot foot, neuropathy.  She also has degenerative disc disease and that has caused peripheral neuropathy.  She has been taking the above listed medicines for some time.  They would like for her to go to a pain clinic to get off of these medications and try some new medication that is being used for neuropathy and to have her stimulator in her spine reprogrammed.  The doctor is Dr. Darral Dash.  I have told her this is an appropriate option for her.  And she has already signed a contract with them.  She has not started getting medicine from them yet.  So she will continue to see them.  And if there is any difficulties where things are not working and nothing is helping she can always come back to [name] to restart her chronic pain regimen as listed above.  She has never given any red flag behavior.  Her PDMP reports were always normal.  She does have a current contract and UDS with our office.  We will be glad to serve her again if needed  She does need a refill on her Trulicity for her diabetes.  Her diabetes does have  associated neuropathy with that.  We did do an A1c 3 months ago and was quite good.  We will plan to do labs in 3 months.  Hopefully she will be able to come into the office after COVID-19 restrictions are gone.  ROS: Per HPI  Allergies  Allergen Reactions  . Actos [Pioglitazone]     Edema   . Invokana [Canagliflozin]     vaginitis   Past Medical History:  Diagnosis Date  . Anxiety   . Attention deficit disorder (ADD)   . Depression   . Diabetes ([place])   . Hiatal hernia   . Movement disorder   . Neuropathy   . Schatzki's ring   . Sleep apnea     Current Outpatient Medications:  .  ASPIRIN LOW DOSE 81 MG EC tablet, TAKE 1 TABLET BY MOUTH EVERY DAY, Disp: 90 tablet, Rfl: 0 .  carbidopa-levodopa (SINEMET IR) 25-100 MG tablet, Take 1 tablet by mouth 3 (three) times daily., Disp: 90 tablet, Rfl: 11 .  cyanocobalamin (,VITAMIN B-12,) 1000 MCG/ML injection, Inject into the muscle., Disp: , Rfl:  .  cyclobenzaprine (FLEXERIL) 10 MG tablet, TAKE 1 TABLET BY MOUTH THREE TIMES DAILY AS NEEDED FOR MUSCLE SPASMS, Disp: 60 tablet, Rfl: 5 .  Dulaglutide (TRULICITY) 1.5 HO/1.2YQ SOPN, inject 1.5 MG into SKIN ONCE EVERY WEEK, Disp: 2 mL, Rfl: 6 .  DULoxetine (CYMBALTA)  60 MG capsule, Take 1 capsule (60 mg total) by mouth daily., Disp: 30 capsule, Rfl: 11 .  fluconazole (DIFLUCAN) 150 MG tablet, TAKE ONE TABLET BY MOUTH every WEEK FOR FOUR WEEKS, Disp: 4 tablet, Rfl: 5 .  fluticasone (FLONASE) 50 MCG/ACT nasal spray, Place 2 sprays into both nostrils daily., Disp: 16 g, Rfl: 0 .  glucose monitoring kit (FREESTYLE) monitoring kit, Check glucose 4 times per day for insulin dependent diabetes with complications. Fill test strips #100 and Lancets #100 with PRN refills., Disp: 1 each, Rfl: 11 .  Insulin Glargine (BASAGLAR KWIKPEN) 100 UNIT/ML SOPN, Inject 0.2 mLs (20 Units total) into the skin at bedtime., Disp: 3 pen, Rfl: 5 .  Insulin Lispro (HUMALOG KWIKPEN) 200 UNIT/ML SOPN, Inject 3-20 Units into  the skin 4 (four) times daily -  before meals and at bedtime., Disp: 15 mL, Rfl: 5 .  Insulin Pen Needle (GLOBAL EASE INJECT PEN NEEDLES) 31G X 5 MM MISC, USE FOUR TIMES DAILY, Disp: 100 each, Rfl: 9 .  levocetirizine (XYZAL) 5 MG tablet, Take 1 tablet (5 mg total) by mouth every evening., Disp: 30 tablet, Rfl: 0 .  metFORMIN (GLUCOPHAGE) 500 MG tablet, Take 2 tablets (1,000 mg total) by mouth daily., Disp: 60 tablet, Rfl: 11 .  naloxegol oxalate (MOVANTIK) 25 MG TABS tablet, Take 1 tablet (25 mg total) by mouth daily., Disp: 30 tablet, Rfl: 5 .  OXcarbazepine (TRILEPTAL) 150 MG tablet, TAKE ONE TABLET BY MOUTH EVERY MORNING AND TAKE TWO TABLETS BY MOUTH AT BEDTIME, Disp: 90 tablet, Rfl: 5 .  oxyCODONE-acetaminophen (PERCOCET) 10-325 MG tablet, Take 1 tablet by mouth every 8 (eight) hours as needed for pain., Disp: 90 tablet, Rfl: 0 .  oxyCODONE-acetaminophen (PERCOCET) 10-325 MG tablet, Take 1 tablet by mouth every 8 (eight) hours as needed for pain., Disp: 90 tablet, Rfl: 0 .  oxyCODONE-acetaminophen (PERCOCET) 10-325 MG tablet, Take 1 tablet by mouth every 8 (eight) hours as needed for pain., Disp: 90 tablet, Rfl: 0 .  pregabalin (LYRICA) 150 MG capsule, TAKE 1 CAPSULE BY MOUTH THREE TIMES DAILY, Disp: 90 capsule, Rfl: 5 .  rosuvastatin (CRESTOR) 5 MG tablet, Take 1 tablet (5 mg total) by mouth at bedtime., Disp: 90 tablet, Rfl: 3  Current Facility-Administered Medications:  .  cyanocobalamin ((VITAMIN B-12)) injection 1,000 mcg, 1,000 mcg, Intramuscular, Q30 days, Particia Nearing S, PA-C, 1,000 mcg at 01/16/19 5732  Assessment/ Plan: 63 y.o. female   1. DDD (degenerative disc disease), lumbosacral Start with chronic management through Dr. Darral Dash and recommendations Dr. Prudy Feeler  2. Type 1 diabetes mellitus with other neurologic complication ([place]) Plan to return in 3 months for lab work She is discontinuing Lyrica at this time.   Start time: 2:39 PM End time: 2:48 PM  No orders of the  defined types were placed in this encounter.   Particia Nearing PA-C [place] [phone]

## 2019-05-23 ENCOUNTER — Other Ambulatory Visit: Payer: Self-pay

## 2019-05-23 ENCOUNTER — Encounter: Payer: PPO | Attending: Psychology | Admitting: Psychology

## 2019-05-23 ENCOUNTER — Encounter: Payer: Self-pay | Admitting: Psychology

## 2019-05-23 DIAGNOSIS — F331 Major depressive disorder, recurrent, moderate: Secondary | ICD-10-CM | POA: Insufficient documentation

## 2019-05-23 DIAGNOSIS — E1142 Type 2 diabetes mellitus with diabetic polyneuropathy: Secondary | ICD-10-CM

## 2019-05-23 DIAGNOSIS — F339 Major depressive disorder, recurrent, unspecified: Secondary | ICD-10-CM | POA: Insufficient documentation

## 2019-05-23 DIAGNOSIS — E1140 Type 2 diabetes mellitus with diabetic neuropathy, unspecified: Secondary | ICD-10-CM | POA: Diagnosis not present

## 2019-05-23 DIAGNOSIS — F411 Generalized anxiety disorder: Secondary | ICD-10-CM | POA: Diagnosis not present

## 2019-05-23 NOTE — Progress Notes (Signed)
Patient ID: Rhonda Thomas, female   DOB: Jul 21, 1956, 63 y.o.   MRN: [id number] [name]   DOB: [birth year]  MR Number: [id number]  Location: [facility] FOR PAIN AND REHABILITATIVE MEDICINE [place] PHYSICAL MEDICINE AND REHABILITATION [place], [place] [id number] [place] [address] Dept: [phone]  Start: 3 PM End: 4 PM  Today's visit was a 1 hour in person visit.  It was conducted in my outpatient clinic office with myself and the patient present.  Provider/Observer:     Edgardo Roys PsyD  Chief Complaint:      Chief Complaint  Patient presents with  . Anxiety  . Depression  . Stress  . Pain    Reason For Service:     I have worked with the patient for several years now. She was initially referred here for difficulty coping with the situation involving her children. Her youngest son is at severe difficulties over the years with regard to mood disorder, substance abuse and behavioral problems. Now there are major stressors associated with him again the patient has been essentially overwhelmed and unsure about what to do about. She reports that this has created a lot of stress both at home and at work. On top of that, her diabetes has gotten to the point that she had hammertoe amputated because of that. She has neuropathy as a result of diabetes as well and has had to stay out of her foot for 8 weeks.  The psychosocial stressors are having significant negative impact upon her severe diabetes with neuropathy and loss of toe.  The above reason for service has been reviewed and remains applicable for the current visit.  The patient reports that her stress is decreased on with regard to her son but he does continue to cause a lot of difficulties.  The patient reports that she is no longer wearing the boot and that the wounds on her foot have healed.  Interventions Strategy:  Cognitive/behavioral therapeutic interventions and  working on issues related to stress and her brittle diabetes/pain issues..  Participation Level:   Active  Participation Quality:  Appropriate      Behavioral Observation:  Well Groomed, Alert, and Appropriate.   Current Psychosocial Factors:   The patient reports that some of the issues around her son have improved slightly since her last visit.  The patient reports that her son is now gotten a different job and she is hoping that he will not be as dirty all the time..  Content of Session:   Reviewed current symptoms and continue to work on building and improving coping skills.  Current Status:   The patient reports that she has been able to finish her spinal cord stimulator surgery and reports that she is experiencing a 50-60% improvement in her pain levels.     Last Reviewed:   05/23/2019  Goals Addressed Today:    Goals addressed included building better coping skills.  Impression/Diagnosis:   The patient has a long history of attention deficit disorder but due to Maj. psychosocial stressors she also developed clinical depression and anxiety. I think that her attentional problems were valid prior to the development of these issues that existed her entire life. However, she is in and repeat if and recurrent trauma with regard primarily to her son and to her daughter to a lesser degree in years past.     Edgardo Roys, PsyD 05/23/2019

## 2019-06-01 ENCOUNTER — Other Ambulatory Visit: Payer: Self-pay | Admitting: Physician Assistant

## 2019-06-02 ENCOUNTER — Telehealth: Payer: PPO | Admitting: Physician Assistant

## 2019-06-02 DIAGNOSIS — N760 Acute vaginitis: Secondary | ICD-10-CM

## 2019-06-02 DIAGNOSIS — B9689 Other specified bacterial agents as the cause of diseases classified elsewhere: Secondary | ICD-10-CM | POA: Diagnosis not present

## 2019-06-02 MED ORDER — CLINDAMYCIN PHOSPHATE 2 % VA CREA: 1.0000 | TOPICAL_CREAM | Freq: Every day | VAGINAL | 0 refills | Status: DC

## 2019-06-02 NOTE — Progress Notes (Signed)
We are sorry that you are not feeling well. Here is how we plan to help! Based on what you shared with me it looks like you: May have a vaginosis due to bacteria giving the discharge is clear and foul-smelling. Typically yeast will be thick, white with milder odor.   Vaginosis is an inflammation of the vagina that can result in discharge, itching and pain. The cause is usually a change in the normal balance of vaginal bacteria or an infection. Vaginosis can also result from reduced estrogen levels after menopause.  The most common causes of vaginosis are:   Bacterial vaginosis which results from an overgrowth of one on several organisms that are normally present in your vagina.   Yeast infections which are caused by a naturally occurring fungus called candida.   Vaginal atrophy (atrophic vaginosis) which results from the thinning of the vagina from reduced estrogen levels after menopause.   Trichomoniasis which is caused by a parasite and is commonly transmitted by sexual intercourse.  Factors that increase your risk of developing vaginosis include: [place] Medications, such as antibiotics and steroids . Uncontrolled diabetes . Use of hygiene products such as bubble bath, vaginal spray or vaginal deodorant . Douching . Wearing damp or tight-fitting clothing . Using an intrauterine device (IUD) for birth control . Hormonal changes, such as those associated with pregnancy, birth control pills or menopause . Sexual activity . Having a sexually transmitted infection  Your treatment plan is Clindamycin vaginal cream 5 grams applied vaginally for 7 days.  I have electronically sent this prescription into the pharmacy that you have chosen.  Be sure to take all of the medication as directed. Stop taking any medication if you develop a rash, tongue swelling or shortness of breath. Mothers who are breast feeding should consider pumping and discarding their breast milk while on these antibiotics. However,  there is no consensus that infant exposure at these doses would be harmful.  Remember that medication creams can weaken latex condoms. [place]   HOME CARE:  Good hygiene may prevent some types of vaginosis from recurring and may relieve some symptoms:  . Avoid baths, hot tubs and whirlpool spas. Rinse soap from your outer genital area after a shower, and dry the area well to prevent irritation. Don't use scented or harsh soaps, such as those with deodorant or antibacterial action. [place] Avoid irritants. These include scented tampons and pads. . Wipe from front to back after using the toilet. Doing so avoids spreading fecal bacteria to your vagina.  Other things that may help prevent vaginosis include:  [place] Don't douche. Your vagina doesn't require cleansing other than normal bathing. Repetitive douching disrupts the normal organisms that reside in the vagina and can actually increase your risk of vaginal infection. Douching won't clear up a vaginal infection. . Use a latex condom. Both female and female latex condoms may help you avoid infections spread by sexual contact. . Wear cotton underwear. Also wear pantyhose with a cotton crotch. If you feel comfortable without it, skip wearing underwear to bed. Yeast thrives in [place] [employer] Your symptoms should improve in the next day or two.  GET HELP RIGHT AWAY IF:  . You have pain in your lower abdomen ( pelvic area or over your ovaries) . You develop nausea or vomiting . You develop a fever . Your discharge changes or worsens . You have persistent pain with intercourse . You develop shortness of breath, a rapid pulse, or you faint.  These symptoms could  be signs of problems or infections that need to be evaluated by a medical provider now.  MAKE SURE YOU    Understand these instructions.  Will watch your condition.  Will get help right away if you are not doing well or get worse.  Your e-visit answers were reviewed by a board certified  advanced clinical practitioner to complete your personal care plan. Depending upon the condition, your plan could have included both over the counter or prescription medications. Please review your pharmacy choice to make sure that you have choses a pharmacy that is open for you to pick up any needed prescription, Your safety is important to [name]. If you have drug allergies check your prescription carefully.   You can use MyChart to ask questions about today's visit, request a non-urgent call back, or ask for a work or school excuse for 24 hours related to this e-Visit. If it has been greater than 24 hours you will need to follow up with your provider, or enter a new e-Visit to address those concerns. You will get a MyChart message within the next two days asking about your experience. I hope that your e-visit has been valuable and will speed your recovery.

## 2019-06-02 NOTE — Progress Notes (Signed)
I have spent 5 minutes in review of e-visit questionnaire, review and updating patient chart, medical decision making and response to patient.   Galia Rahm Cody Jaylissa Felty, PA-C    

## 2019-06-03 ENCOUNTER — Ambulatory Visit: Payer: PPO | Admitting: Physician Assistant

## 2019-06-03 ENCOUNTER — Encounter: Payer: Self-pay | Admitting: Physician Assistant

## 2019-06-03 ENCOUNTER — Telehealth: Payer: PPO | Admitting: Physician Assistant

## 2019-06-03 DIAGNOSIS — N390 Urinary tract infection, site not specified: Secondary | ICD-10-CM

## 2019-06-03 NOTE — Progress Notes (Signed)
Based on what you shared with me, I feel your condition warrants further evaluation and I recommend that you be seen for a face to face office visit.  Rhonda Thomas,  I recommend having a face to fae evaluation for your symptoms. It is recommended that you have a urine test and possible urine culture to know exactly which bacteria may have caused the UTI and to be on the appropriate treatment.   NOTE: If you entered your credit card information for this eVisit, you will not be charged. You may see a "hold" on your card for the $35 but that hold will drop off and you will not have a charge processed.  If you are having a true medical emergency please call 911.     For an urgent face to face visit, [facility] has five urgent care centers for your convenience:    [email or web address] to reserve your spot online an avoid wait times  [facility] [address] [place], [place] [address] Modified hours of operation: Monday-Friday, 12 PM to 6 PM  Closed Saturday & Sunday  *Across the street from [place] (New Address!) [address], [place], [place] [address] *Just off [address], across the road from [place] hours of operation: Monday-Friday, 12 PM to 6 PM  Closed Saturday & Sunday   The following sites will take your insurance:  . [facility]    [phone]                  Get Driving Directions  [address], [place] [address] . 10 am to 8 pm Monday-Friday . 12 pm to 8 pm Saturday-Sunday   . [facility] at [place]                  Get Driving Directions  [address], [place], [place] [address] . 8 am to 8 pm Monday-Friday . 9 am to 6 pm Saturday . 11 am to 6 pm Sunday   . [facility] at [place]                  Get Driving Directions   [address]  [address], [place] [address] . 8 am to 8 pm Monday-Friday . 8 am to 4 pm Saturday-Sunday    . [facility] at [place]                    Get Driving Directions  [phone]  [address], [place], [place] [address]  . Monday-Friday, 12 PM to 6 PM    Your e-visit answers were reviewed by a board certified advanced clinical practitioner to complete your personal care plan.  Thank you for using e-Visits. I spent 5-10 minutes on review and completion of this note- Lacy Duverney [facility]

## 2019-06-04 ENCOUNTER — Other Ambulatory Visit: Payer: Self-pay | Admitting: Physician Assistant

## 2019-06-05 ENCOUNTER — Other Ambulatory Visit: Payer: Self-pay

## 2019-06-05 ENCOUNTER — Encounter: Payer: Self-pay | Admitting: Family Medicine

## 2019-06-05 ENCOUNTER — Ambulatory Visit (INDEPENDENT_AMBULATORY_CARE_PROVIDER_SITE_OTHER): Payer: PPO | Admitting: Family Medicine

## 2019-06-05 ENCOUNTER — Ambulatory Visit (INDEPENDENT_AMBULATORY_CARE_PROVIDER_SITE_OTHER): Payer: PPO | Admitting: *Deleted

## 2019-06-05 DIAGNOSIS — E538 Deficiency of other specified B group vitamins: Secondary | ICD-10-CM | POA: Diagnosis not present

## 2019-06-05 DIAGNOSIS — R300 Dysuria: Secondary | ICD-10-CM | POA: Diagnosis not present

## 2019-06-05 LAB — URINALYSIS, COMPLETE
Bilirubin, UA: NEGATIVE
Glucose, UA: NEGATIVE
Ketones, UA: NEGATIVE
Nitrite, UA: NEGATIVE
Specific Gravity, UA: >1.03 — ABNORMAL HIGH (ref 1.005–1.030)
Urobilinogen, Ur: 0.2 mg/dL (ref 0.2–1.0)
pH, UA: 6 (ref 5.0–7.5)

## 2019-06-05 LAB — MICROSCOPIC EXAMINATION
Renal Epithel, UA: NONE SEEN /HPF
WBC, UA: >30 /HPF — AB (ref 0–5)

## 2019-06-05 MED ORDER — SULFAMETHOXAZOLE-TRIMETHOPRIM 800-160 MG PO TABS: 1.0000 | ORAL_TABLET | Freq: Two times a day (BID) | ORAL | 0 refills | Status: DC

## 2019-06-05 MED ORDER — FLUCONAZOLE 150 MG PO TABS: 150.0000 mg | ORAL_TABLET | Freq: Once | ORAL | 2 refills | Status: DC

## 2019-06-05 NOTE — Progress Notes (Signed)
Pt given Cyanocobalamin inj Tolerated well 

## 2019-06-05 NOTE — Progress Notes (Signed)
Virtual Visit via telephone Note  I connected with Rhonda Thomas on 06/05/19 at 1343 by telephone and verified that I am speaking with the correct person using two identifiers. Rhonda Thomas is currently located at home and no other people are currently with her during visit. The provider, Fransisca Kaufmann Dettinger, MD is located in their office at time of visit.  Call ended at 1350  I discussed the limitations, risks, security and privacy concerns of performing an evaluation and management service by telephone and the availability of in person appointments. I also discussed with the patient that there may be a patient responsible charge related to this service. The patient expressed understanding and agreed to proceed.   History and Present Illness: Patient has 4-5 days of frequency and urgency and it is not improving.  She has dysuria but no hematuria.  She denies any fever or abdominal or flank pain.  She has increased fluid. She denies any vaginal irritation or discharge   Outpatient Encounter Medications as of 06/05/2019  Medication Sig  . ASPIRIN LOW DOSE 81 MG EC tablet TAKE 1 TABLET BY MOUTH EVERY DAY  . carbidopa-levodopa (SINEMET IR) 25-100 MG tablet Take 1 tablet by mouth 3 (three) times daily.  . clindamycin (CLEOCIN) 2 % vaginal cream Place 1 Applicatorful vaginally at bedtime. Insert 1 Applicatorful of medication into the vagina once nightly x 7 nights  . cyanocobalamin (,VITAMIN B-12,) 1000 MCG/ML injection Inject into the muscle.  . cyclobenzaprine (FLEXERIL) 10 MG tablet TAKE 1 TABLET BY MOUTH THREE TIMES DAILY AS NEEDED FOR MUSCLE SPASMS  . Dulaglutide (TRULICITY) 1.5 ZO/1.0RU SOPN inject 1.5 MG into SKIN ONCE EVERY WEEK  . DULoxetine (CYMBALTA) 60 MG capsule Take 1 capsule (60 mg total) by mouth daily.  . fluconazole (DIFLUCAN) 150 MG tablet TAKE ONE TABLET BY MOUTH every WEEK FOR FOUR WEEKS  . fluticasone (FLONASE) 50 MCG/ACT nasal spray Place 2 sprays into both  nostrils daily.  Marland Kitchen glucose monitoring kit (FREESTYLE) monitoring kit Check glucose 4 times per day for insulin dependent diabetes with complications. Fill test strips #100 and Lancets #100 with PRN refills.  . Insulin Glargine (BASAGLAR KWIKPEN) 100 UNIT/ML SOPN Inject 0.2 mLs (20 Units total) into the skin at bedtime.  . Insulin Lispro (HUMALOG KWIKPEN) 200 UNIT/ML SOPN Inject 3-20 Units into the skin 4 (four) times daily -  before meals and at bedtime.  . Insulin Pen Needle (GLOBAL EASE INJECT PEN NEEDLES) 31G X 5 MM MISC USE FOUR TIMES DAILY  . levocetirizine (XYZAL) 5 MG tablet Take 1 tablet (5 mg total) by mouth every evening.  . metFORMIN (GLUCOPHAGE) 500 MG tablet Take 2 tablets (1,000 mg total) by mouth daily.  . naloxegol oxalate (MOVANTIK) 25 MG TABS tablet Take 1 tablet (25 mg total) by mouth daily.  . OXcarbazepine (TRILEPTAL) 150 MG tablet TAKE ONE TABLET BY MOUTH EVERY MORNING AND TAKE TWO TABLETS BY MOUTH AT BEDTIME  . oxyCODONE-acetaminophen (PERCOCET) 10-325 MG tablet Take 1 tablet by mouth every 8 (eight) hours as needed for pain.  Marland Kitchen oxyCODONE-acetaminophen (PERCOCET) 10-325 MG tablet Take 1 tablet by mouth every 8 (eight) hours as needed for pain.  Marland Kitchen oxyCODONE-acetaminophen (PERCOCET) 10-325 MG tablet Take 1 tablet by mouth every 8 (eight) hours as needed for pain.  . pregabalin (LYRICA) 150 MG capsule TAKE 1 CAPSULE BY MOUTH THREE TIMES DAILY  . rosuvastatin (CRESTOR) 5 MG tablet Take 1 tablet (5 mg total) by mouth at bedtime.   Facility-Administered  Encounter Medications as of 06/05/2019  Medication  . cyanocobalamin ((VITAMIN B-12)) injection 1,000 mcg    Review of Systems  Constitutional: Negative for chills and fever.  Eyes: Negative for visual disturbance.  Respiratory: Negative for chest tightness and shortness of breath.   Cardiovascular: Negative for chest pain and leg swelling.  Gastrointestinal: Negative for abdominal pain.  Genitourinary: Positive for dysuria,  frequency and urgency. Negative for difficulty urinating, flank pain, hematuria, vaginal bleeding, vaginal discharge and vaginal pain.  Musculoskeletal: Negative for back pain and gait problem.  Skin: Negative for rash.  Neurological: Negative for light-headedness and headaches.  Psychiatric/Behavioral: Negative for agitation and behavioral problems.  All other systems reviewed and are negative.   Urinalysis: Greater than 30 WBCs 3-10 RBCs, 0-10 epithelial cells, moderate bacteria, yeast present, 1+ leukocytes, trace blood, trace protein  Observations/Objective: Patient sounds comfortable and in no acute distress  Assessment and Plan: Problem List Items Addressed This Visit    None    Visit Diagnoses    Dysuria    -  Primary   Relevant Medications   sulfamethoxazole-trimethoprim (BACTRIM DS) 800-160 MG tablet   fluconazole (DIFLUCAN) 150 MG tablet   Other Relevant Orders   Urinalysis, Complete (Completed)       Follow Up Instructions: As needed    I discussed the assessment and treatment plan with the patient. The patient was provided an opportunity to ask questions and all were answered. The patient agreed with the plan and demonstrated an understanding of the instructions.   The patient was advised to call back or seek an in-person evaluation if the symptoms worsen or if the condition fails to improve as anticipated.  The above assessment and management plan was discussed with the patient. The patient verbalized understanding of and has agreed to the management plan. Patient is aware to call the clinic if symptoms persist or worsen. Patient is aware when to return to the clinic for a follow-up visit. Patient educated on when it is appropriate to go to the emergency department.    I provided 7 minutes of non-face-to-face time during this encounter.    Worthy Rancher, MD

## 2019-06-13 ENCOUNTER — Other Ambulatory Visit: Payer: Self-pay | Admitting: Family Medicine

## 2019-06-13 DIAGNOSIS — R300 Dysuria: Secondary | ICD-10-CM

## 2019-06-17 DIAGNOSIS — E1140 Type 2 diabetes mellitus with diabetic neuropathy, unspecified: Secondary | ICD-10-CM | POA: Diagnosis not present

## 2019-06-17 DIAGNOSIS — G894 Chronic pain syndrome: Secondary | ICD-10-CM | POA: Diagnosis not present

## 2019-06-17 DIAGNOSIS — Z79899 Other long term (current) drug therapy: Secondary | ICD-10-CM | POA: Diagnosis not present

## 2019-06-17 DIAGNOSIS — G2581 Restless legs syndrome: Secondary | ICD-10-CM | POA: Diagnosis not present

## 2019-06-17 DIAGNOSIS — Z5181 Encounter for therapeutic drug level monitoring: Secondary | ICD-10-CM | POA: Diagnosis not present

## 2019-06-26 ENCOUNTER — Other Ambulatory Visit: Payer: Self-pay

## 2019-06-26 ENCOUNTER — Emergency Department (HOSPITAL_COMMUNITY)
Admission: EM | Admit: 2019-06-26 | Discharge: 2019-06-26 | Disposition: A | Discharge status: Home / Self Care | Payer: PPO | Attending: Emergency Medicine | Admitting: Emergency Medicine

## 2019-06-26 ENCOUNTER — Encounter (HOSPITAL_COMMUNITY): Payer: Self-pay

## 2019-06-26 ENCOUNTER — Emergency Department (HOSPITAL_COMMUNITY): Payer: PPO

## 2019-06-26 DIAGNOSIS — E119 Type 2 diabetes mellitus without complications: Secondary | ICD-10-CM | POA: Insufficient documentation

## 2019-06-26 DIAGNOSIS — Y999 Unspecified external cause status: Secondary | ICD-10-CM | POA: Insufficient documentation

## 2019-06-26 DIAGNOSIS — W19XXXA Unspecified fall, initial encounter: Secondary | ICD-10-CM | POA: Insufficient documentation

## 2019-06-26 DIAGNOSIS — Z794 Long term (current) use of insulin: Secondary | ICD-10-CM | POA: Insufficient documentation

## 2019-06-26 DIAGNOSIS — Y939 Activity, unspecified: Secondary | ICD-10-CM | POA: Insufficient documentation

## 2019-06-26 DIAGNOSIS — Y929 Unspecified place or not applicable: Secondary | ICD-10-CM | POA: Diagnosis not present

## 2019-06-26 DIAGNOSIS — S300XXA Contusion of lower back and pelvis, initial encounter: Secondary | ICD-10-CM | POA: Diagnosis not present

## 2019-06-26 DIAGNOSIS — Z79899 Other long term (current) drug therapy: Secondary | ICD-10-CM | POA: Diagnosis not present

## 2019-06-26 DIAGNOSIS — S3992XA Unspecified injury of lower back, initial encounter: Secondary | ICD-10-CM | POA: Diagnosis present

## 2019-06-26 DIAGNOSIS — M545 Low back pain: Secondary | ICD-10-CM | POA: Diagnosis not present

## 2019-06-26 NOTE — ED Triage Notes (Signed)
Pt says she got up quickly this morning to answer the phone and fell around 0530.  Pt says she has balance problems because of her neuropathy.  Pt says she doesn't think she passed out but isn't sure.  Pt c/o pain in back.

## 2019-06-26 NOTE — ED Provider Notes (Signed)
[facility] EMERGENCY DEPARTMENT Provider Note   CSN: [id number] Arrival date & time: 06/26/19  5449     History   Chief Complaint Chief Complaint  Patient presents with  . Fall    HPI Rhonda Thomas is a 63 y.o. female.     Patient fell and landed on her tailbone.  Patient complains of pain to her lower back  The history is provided by the patient. No language interpreter was used.  Fall This is a new problem. The problem occurs rarely. The problem has been resolved. Pertinent negatives include no chest pain, no abdominal pain and no headaches. Nothing aggravates the symptoms. Nothing relieves the symptoms.    Past Medical History:  Diagnosis Date  . Anxiety   . Attention deficit disorder (ADD)   . Depression   . Diabetes ([place])   . Hiatal hernia   . Movement disorder   . Neuropathy   . Schatzki's ring   . Sleep apnea     Patient Active Problem List   Diagnosis Date Noted  . Major depressive disorder, recurrent episode, moderate ([place]) 02/09/2018  . Diabetic peripheral neuropathy ([place]) 11/07/2017  . Vitamin B12 deficiency 11/07/2017  . DDD (degenerative disc disease), lumbosacral 10/02/2017  . Skin ulcer of left foot, limited to breakdown of skin ([place]) 08/25/2017  . Overweight (BMI 25.0-29.9) 10/24/2016  . Somnolence, daytime 03/10/2015  . Generalized anxiety disorder 06/21/2013  . Attention deficit disorder 06/21/2013  . Hyperlipidemia 06/21/2013  . Insomnia 06/21/2013  . Restless legs syndrome (RLS) 06/20/2013  . Type 2 diabetes mellitus with diabetic neuropathy ([place]) 06/20/2013    Past Surgical History:  Procedure Laterality Date  . ABDOMINAL HYSTERECTOMY    . BREAST LUMPECTOMY WITH RADIOACTIVE SEED LOCALIZATION Right 04/25/2016   Procedure: RIGHT BREAST LUMPECTOMY WITH RADIOACTIVE SEED LOCALIZATION;  Surgeon: Autumn Messing III, MD;  Location: [facility];  Service: General;  Laterality: Right;  . CHOLECYSTECTOMY    . RECTOCELE REPAIR     . TOE AMPUTATION Right    2nd toe rt foot  . TUBAL LIGATION       OB History   No obstetric history on file.      Home Medications    Prior to Admission medications   Medication Sig Start Date End Date Taking? Authorizing Provider  ASPIRIN LOW DOSE 81 MG EC tablet TAKE 1 TABLET BY MOUTH EVERY DAY 04/10/19  Yes Terald Sleeper, PA-C  carbidopa-levodopa (SINEMET IR) 25-100 MG tablet Take 1 tablet by mouth 3 (three) times daily. 09/03/18  Yes Terald Sleeper, PA-C  cyanocobalamin (,VITAMIN B-12,) 1000 MCG/ML injection Inject into the muscle. 08/28/17 08/18/19 Yes [provider]  Dulaglutide (TRULICITY) 1.5 EE/1.0OF SOPN inject 1.5 MG into SKIN ONCE EVERY WEEK 05/20/19  Yes Terald Sleeper, PA-C  DULoxetine (CYMBALTA) 60 MG capsule Take 1 capsule (60 mg total) by mouth daily. 11/30/18  Yes Terald Sleeper, PA-C  glucose monitoring kit (FREESTYLE) monitoring kit Check glucose 4 times per day for insulin dependent diabetes with complications. Fill test strips #100 and Lancets #100 with PRN refills. 09/03/18  Yes Terald Sleeper, PA-C  Insulin Glargine (BASAGLAR KWIKPEN) 100 UNIT/ML SOPN Inject 0.2 mLs (20 Units total) into the skin at bedtime. 02/22/19  Yes Terald Sleeper, PA-C  Insulin Lispro (HUMALOG KWIKPEN) 200 UNIT/ML SOPN Inject 3-20 Units into the skin 4 (four) times daily -  before meals and at bedtime. 11/30/18  Yes Terald Sleeper, PA-C  Insulin Pen Needle (  GLOBAL EASE INJECT PEN NEEDLES) 31G X 5 MM MISC USE FOUR TIMES DAILY 11/30/18  Yes Terald Sleeper, PA-C  metFORMIN (GLUCOPHAGE) 500 MG tablet Take 2 tablets (1,000 mg total) by mouth daily. 09/03/18  Yes Terald Sleeper, PA-C  NUCYNTA ER 150 MG TB12 Take 1 tablet by mouth every 12 (twelve) hours. 06/19/19  Yes [provider]  OXcarbazepine (TRILEPTAL) 150 MG tablet TAKE ONE TABLET BY MOUTH EVERY MORNING AND TAKE TWO TABLETS BY MOUTH AT BEDTIME 02/28/19  Yes Terald Sleeper, PA-C  pregabalin (LYRICA) 150 MG capsule TAKE 1 CAPSULE BY  MOUTH THREE TIMES DAILY 02/28/19  Yes Terald Sleeper, PA-C  rosuvastatin (CRESTOR) 5 MG tablet Take 1 tablet (5 mg total) by mouth at bedtime. 11/30/18  Yes Terald Sleeper, PA-C  clindamycin (CLEOCIN) 2 % vaginal cream Place 1 Applicatorful vaginally at bedtime. Insert 1 Applicatorful of medication into the vagina once nightly x 7 nights Patient not taking: Reported on 06/26/2019 06/02/19   Brunetta Jeans, PA-C  cyclobenzaprine (FLEXERIL) 10 MG tablet TAKE 1 TABLET BY MOUTH THREE TIMES DAILY AS NEEDED FOR MUSCLE SPASMS Patient not taking: Reported on 06/26/2019 09/17/18   Terald Sleeper, PA-C  fluconazole (DIFLUCAN) 150 MG tablet TAKE 1 TABLET BY MOUTH FOR ONE DOSE Patient not taking: Reported on 06/26/2019 06/13/19   Terald Sleeper, PA-C  fluticasone (FLONASE) 50 MCG/ACT nasal spray Place 2 sprays into both nostrils daily. Patient not taking: Reported on 06/26/2019 03/21/19   Kennyth Arnold, FNP  levocetirizine (XYZAL) 5 MG tablet Take 1 tablet (5 mg total) by mouth every evening. Patient not taking: Reported on 06/26/2019 03/21/19   Dutch Quint B, FNP  naloxegol oxalate (MOVANTIK) 25 MG TABS tablet Take 1 tablet (25 mg total) by mouth daily. Patient not taking: Reported on 06/26/2019 12/03/18   Terald Sleeper, PA-C  oxyCODONE-acetaminophen (PERCOCET) 10-325 MG tablet Take 1 tablet by mouth every 8 (eight) hours as needed for pain. 02/28/19   Terald Sleeper, PA-C  oxyCODONE-acetaminophen (PERCOCET) 10-325 MG tablet Take 1 tablet by mouth every 8 (eight) hours as needed for pain. 02/28/19   Terald Sleeper, PA-C  oxyCODONE-acetaminophen (PERCOCET) 10-325 MG tablet Take 1 tablet by mouth every 8 (eight) hours as needed for pain. 02/28/19   Terald Sleeper, PA-C  sulfamethoxazole-trimethoprim (BACTRIM DS) 800-160 MG tablet Take 1 tablet by mouth 2 (two) times daily. Patient not taking: Reported on 06/26/2019 06/05/19   Dettinger, Fransisca Kaufmann, MD    Family History Family History  Problem Relation Age of Onset  .  Stroke Mother   . Diabetes Other   . Heart Problems Other   . Colon cancer Neg Hx     Social History Social History   Tobacco Use  . Smoking status: Never Smoker  . Smokeless tobacco: Never Used  Substance Use Topics  . Alcohol use: No    Alcohol/week: 0.0 standard drinks  . Drug use: No     Allergies   Actos [pioglitazone] and Invokana [canagliflozin]   Review of Systems Review of Systems  Constitutional: Negative for appetite change and fatigue.  HENT: Negative for congestion, ear discharge and sinus pressure.   Eyes: Negative for discharge.  Respiratory: Negative for cough.   Cardiovascular: Negative for chest pain.  Gastrointestinal: Negative for abdominal pain and diarrhea.  Genitourinary: Negative for frequency and hematuria.  Musculoskeletal: Positive for back pain.  Skin: Negative for rash.  Neurological: Negative for seizures and headaches.  Psychiatric/Behavioral:  Negative for hallucinations.     Physical Exam Updated Vital Signs BP (!) 149/75 (BP Location: Left Arm)   Pulse 73   Temp (!) 97.5 F (36.4 C) (Oral)   Resp 20   Ht 5' 10"  (1.778 m)   Wt 90.7 kg   SpO2 98%   BMI 28.70 kg/m   Physical Exam Vitals signs and nursing note reviewed.  Constitutional:      Appearance: She is well-developed.  HENT:     Head: Normocephalic.     Mouth/Throat:     Mouth: Mucous membranes are moist.  Eyes:     General: No scleral icterus.    Conjunctiva/sclera: Conjunctivae normal.  Neck:     Musculoskeletal: Neck supple.     Thyroid: No thyromegaly.  Cardiovascular:     Rate and Rhythm: Normal rate and regular rhythm.     Heart sounds: No murmur. No friction rub. No gallop.   Pulmonary:     Breath sounds: No stridor. No wheezing or rales.  Chest:     Chest wall: No tenderness.  Abdominal:     General: There is no distension.     Tenderness: There is no abdominal tenderness. There is no rebound.  Musculoskeletal: Normal range of motion.      Comments: Tender lumbar spine  Lymphadenopathy:     Cervical: No cervical adenopathy.  Skin:    Findings: No erythema or rash.  Neurological:     Mental Status: She is oriented to person, place, and time.     Motor: No abnormal muscle tone.     Coordination: Coordination normal.  Psychiatric:        Behavior: Behavior normal.      ED Treatments / Results  Labs (all labs ordered are listed, but only abnormal results are displayed) Labs Reviewed - No data to display  EKG None  Radiology Dg Lumbar Spine Complete  Result Date: 06/26/2019 CLINICAL DATA:  Pt states she fell onto back this morning at home onto back/pain low back pain/ no leg pain EXAM: LUMBAR SPINE - COMPLETE 4+ VIEW COMPARISON:  None. FINDINGS: There are 5 nonrib bearing lumbar-type vertebral bodies. There is an L1 vertebral body compression fracture. There is no acute fracture. There is no static listhesis. There is no spondylolysis. There is degenerative disease with disc height loss at L5-S1. Bilateral facet arthropathy at L4-5 and L5-S1. There is a spinal stimulator noted. The SI joints are unremarkable. IMPRESSION: L1 vertebral body compression fracture of indeterminate age. Electronically Signed   By: Kathreen Devoid   On: 06/26/2019 11:33    Procedures Procedures (including critical care time)  Medications Ordered in ED Medications - No data to display   Initial Impression / Assessment and Plan / ED Course  I have reviewed the triage vital signs and the nursing notes.  Pertinent labs & imaging results that were available during my care of the patient were reviewed by me and considered in my medical decision making (see chart for details).        Patient with contusion to lumbar spine.  Patient with old compression fracture.  She will continue her present medicines and follow-up with her doctor  Final Clinical Impressions(s) / ED Diagnoses   Final diagnoses:  Fall, initial encounter    ED Discharge  Orders    None       Milton Ferguson, MD 06/26/19 1353

## 2019-06-26 NOTE — Discharge Instructions (Addendum)
Take take your pain medicine as prescribed by the pain clinic and follow-up with your doctor next week if any problems

## 2019-07-08 ENCOUNTER — Other Ambulatory Visit: Payer: Self-pay

## 2019-07-08 ENCOUNTER — Encounter: Payer: PPO | Attending: Psychology | Admitting: Psychology

## 2019-07-08 DIAGNOSIS — F331 Major depressive disorder, recurrent, moderate: Secondary | ICD-10-CM | POA: Insufficient documentation

## 2019-07-08 DIAGNOSIS — E1140 Type 2 diabetes mellitus with diabetic neuropathy, unspecified: Secondary | ICD-10-CM | POA: Diagnosis not present

## 2019-07-08 DIAGNOSIS — F339 Major depressive disorder, recurrent, unspecified: Secondary | ICD-10-CM | POA: Diagnosis not present

## 2019-07-08 DIAGNOSIS — F411 Generalized anxiety disorder: Secondary | ICD-10-CM

## 2019-07-08 DIAGNOSIS — E1142 Type 2 diabetes mellitus with diabetic polyneuropathy: Secondary | ICD-10-CM

## 2019-07-08 DIAGNOSIS — M5137 Other intervertebral disc degeneration, lumbosacral region: Secondary | ICD-10-CM

## 2019-07-10 ENCOUNTER — Encounter: Payer: Self-pay | Admitting: Psychology

## 2019-07-10 NOTE — Progress Notes (Signed)
Patient ID: Rhonda Thomas, female   DOB: 12/07/1956, 63 y.o.   MRN: [id number] [name]   DOB: June 17, 1956  MR Number: [id number]  Location: [facility] [place], [place] [id number] [place] [address] Dept: [phone]  Start: 3 PM End: 4 PM  Today's visit was a 1 hour in person visit.  It was conducted in my outpatient clinic office with myself and the patient present.  Provider/Observer:     Edgardo Roys PsyD  Chief Complaint:      Chief Complaint  Patient presents with  . Anxiety  . Pain  . Stress  . Depression    Reason For Service:     I have worked with the patient for several years now. She was initially referred here for difficulty coping with the situation involving her children. Her youngest son is at severe difficulties over the years with regard to mood disorder, substance abuse and behavioral problems. Now there are major stressors associated with him again the patient has been essentially overwhelmed and unsure about what to do about. She reports that this has created a lot of stress both at home and at work. On top of that, her diabetes has gotten to the point that she had hammertoe amputated because of that. She has neuropathy as a result of diabetes as well and has had to stay out of her foot for 8 weeks.  The psychosocial stressors are having significant negative impact upon her severe diabetes with neuropathy and loss of toe.  The above reason for service has been reviewed and remains applicable for the current visit.  The patient reports that her stress is decreased on with regard to her son but he does continue to cause a lot of difficulties.  The patient reports that she is no longer wearing the boot and that the wounds on her foot have healed.  Interventions Strategy:  Cognitive/behavioral therapeutic interventions and  working on issues related to stress and her brittle diabetes/pain issues..  Participation Level:   Active  Participation Quality:  Appropriate      Behavioral Observation:  Well Groomed, Alert, and Appropriate.   Current Psychosocial Factors:   The patient reports that some of the issues around her son have improved slightly since her last visit.  The patient reports that her son is now gotten a different job and she is hoping that he will not be as dirty all the time..  Content of Session:   Reviewed current symptoms and continue to work on building and improving coping skills.  Current Status:   The patient reports that she has been able to finish her spinal cord stimulator surgery and reports that she is experiencing a 50-60% improvement in her pain levels.     Last Reviewed:   07/06/2019  Goals Addressed Today:    Goals addressed included building better coping skills.  Impression/Diagnosis:   The patient has a long history of attention deficit disorder but due to Maj. psychosocial stressors she also developed clinical depression and anxiety. I think that her attentional problems were valid prior to the development of these issues that existed her entire life. However, she is in and repeat if and recurrent trauma with regard primarily to her son and to her daughter to a lesser degree in years past.     Edgardo Roys, [place] 07/10/2019

## 2019-07-11 ENCOUNTER — Other Ambulatory Visit: Payer: Self-pay | Admitting: Physician Assistant

## 2019-07-11 DIAGNOSIS — E785 Hyperlipidemia, unspecified: Secondary | ICD-10-CM

## 2019-07-22 ENCOUNTER — Other Ambulatory Visit: Payer: Self-pay

## 2019-07-22 ENCOUNTER — Ambulatory Visit (INDEPENDENT_AMBULATORY_CARE_PROVIDER_SITE_OTHER): Payer: PPO

## 2019-07-22 DIAGNOSIS — E538 Deficiency of other specified B group vitamins: Secondary | ICD-10-CM | POA: Diagnosis not present

## 2019-07-22 NOTE — Progress Notes (Signed)
Patient given b12 injection and tolerated well.  

## 2019-08-01 ENCOUNTER — Ambulatory Visit (INDEPENDENT_AMBULATORY_CARE_PROVIDER_SITE_OTHER): Payer: PPO | Admitting: Family Medicine

## 2019-08-01 ENCOUNTER — Encounter: Payer: Self-pay | Admitting: Family Medicine

## 2019-08-01 ENCOUNTER — Other Ambulatory Visit: Payer: PPO

## 2019-08-01 ENCOUNTER — Other Ambulatory Visit: Payer: Self-pay

## 2019-08-01 DIAGNOSIS — N390 Urinary tract infection, site not specified: Secondary | ICD-10-CM | POA: Diagnosis not present

## 2019-08-01 DIAGNOSIS — R300 Dysuria: Secondary | ICD-10-CM

## 2019-08-01 DIAGNOSIS — Z8619 Personal history of other infectious and parasitic diseases: Secondary | ICD-10-CM | POA: Diagnosis not present

## 2019-08-01 LAB — URINALYSIS, COMPLETE
Bilirubin, UA: NEGATIVE
Glucose, UA: NEGATIVE
Nitrite, UA: POSITIVE — AB
Specific Gravity, UA: 1.025 (ref 1.005–1.030)
Urobilinogen, Ur: 1 mg/dL (ref 0.2–1.0)
pH, UA: 6 (ref 5.0–7.5)

## 2019-08-01 LAB — MICROSCOPIC EXAMINATION
RBC, Urine: >30 /hpf — AB (ref 0–2)
Renal Epithel, UA: NONE SEEN /hpf

## 2019-08-01 MED ORDER — FLUCONAZOLE 150 MG PO TABS: 150.0000 mg | ORAL_TABLET | Freq: Once | ORAL | 3 refills | Status: AC

## 2019-08-01 MED ORDER — CIPROFLOXACIN HCL 500 MG PO TABS: 500.0000 mg | ORAL_TABLET | Freq: Two times a day (BID) | ORAL | 0 refills | Status: AC

## 2019-08-01 NOTE — Progress Notes (Signed)
Virtual Visit via telephone Note Due to COVID-19 pandemic this visit was conducted virtually. This visit type was conducted due to national recommendations for restrictions regarding the COVID-19 Pandemic (e.g. social distancing, sheltering in place) in an effort to limit this patient's exposure and mitigate transmission in our community. All issues noted in this document were discussed and addressed.  A physical exam was not performed with this format.   I connected with Rhonda Thomas on 08/01/19 at 1040 by telephone and verified that I am speaking with the correct person using two identifiers. Rhonda Thomas is currently located at home and no one is currently with them during visit. The provider, Monia Pouch, FNP is located in their office at time of visit.  I discussed the limitations, risks, security and privacy concerns of performing an evaluation and management service by telephone and the availability of in person appointments. I also discussed with the patient that there may be a patient responsible charge related to this service. The patient expressed understanding and agreed to proceed.  Subjective:  Patient ID: Rhonda Thomas, female    DOB: October 30, 1956, 63 y.o.   MRN: [id number]  Chief Complaint:  Dysuria   HPI: Rhonda Thomas is a 63 y.o. female presenting on 08/01/2019 for Dysuria   Pt reports dysuria, lower abdominal pressure with voiding, frequency, and urgency. Onset 3-4 days ago.   Dysuria  This is a new problem. The current episode started in the past 7 days. The problem has been gradually worsening. The quality of the pain is described as burning and shooting. The pain is at a severity of 4/10. The pain is mild. There has been no fever. There is no history of pyelonephritis. Associated symptoms include frequency and urgency. Pertinent negatives include no chills, discharge, flank pain, hematuria, hesitancy, nausea, possible pregnancy, sweats or  vomiting. She has tried increased fluids for the symptoms. The treatment provided no relief. Her past medical history is significant for recurrent UTIs.     Relevant past medical, surgical, family, and social history reviewed and updated as indicated.  Allergies and medications reviewed and updated.   Past Medical History:  Diagnosis Date   Anxiety    Attention deficit disorder (ADD)    Depression    Diabetes ([place])    Hiatal hernia    Movement disorder    Neuropathy    Schatzki's ring    Sleep apnea     Past Surgical History:  Procedure Laterality Date   ABDOMINAL HYSTERECTOMY     BREAST LUMPECTOMY WITH RADIOACTIVE SEED LOCALIZATION Right 04/25/2016   Procedure: RIGHT BREAST LUMPECTOMY WITH RADIOACTIVE SEED LOCALIZATION;  Surgeon: Autumn Messing III, MD;  Location: [facility];  Service: General;  Laterality: Right;   CHOLECYSTECTOMY     RECTOCELE REPAIR     TOE AMPUTATION Right    2nd toe rt foot   TUBAL LIGATION      Social History   Socioeconomic History   Marital status: Married    Spouse name: [name]   Number of children: 3   Years of education: college   Highest education level: Not on file  Occupational History    Employer: [employer]    Comment: Pharmacist, [employer]  Social Needs   Financial resource strain: Not on file   Food insecurity    Worry: Not on file    Inability: Not on file   Transportation needs    Medical: Not on file  Non-medical: Not on file  °Tobacco Use  °• Smoking status: Never Smoker  °• Smokeless tobacco: Never Used  °Substance and Sexual Activity  °• Alcohol use: No  °  Alcohol/week: 0.0 standard drinks  °• Drug use: No  °• Sexual activity: Not on file  °Lifestyle  °• Physical activity  °  Days per week: Not on file  °  Minutes per session: Not on file  °• Stress: Not on file  °Relationships  °• Social connections  °  Talks on phone: Not on file  °  Gets together: Not on file  °  Attends  religious service: Not on file  °  Active member of club or organization: Not on file  °  Attends meetings of clubs or organizations: Not on file  °  Relationship status: Not on file  °• Intimate partner violence  °  Fear of current or ex partner: Not on file  °  Emotionally abused: Not on file  °  Physically abused: Not on file  °  Forced sexual activity: Not on file  °Other Topics Concern  °• Not on file  °Social History Narrative  ° Patient lives at home with her husband. (James).  ° Patient works for [employer].   ° Education- College  ° Right handed.  ° Caffeine- tea one cup daily.  °   °   ° ° °Outpatient Encounter Medications as of 08/01/2019  °Medication Sig  °• ASPIRIN ADULT LOW STRENGTH 81 MG EC tablet TAKE 1 TABLET BY MOUTH EVERY DAY  °• carbidopa-levodopa (SINEMET IR) 25-100 MG tablet Take 1 tablet by mouth 3 (three) times daily.  °• ciprofloxacin (CIPRO) 500 MG tablet Take 1 tablet (500 mg total) by mouth 2 (two) times daily for 5 days.  °• clindamycin (CLEOCIN) 2 % vaginal cream Place 1 Applicatorful vaginally at bedtime. Insert 1 Applicatorful of medication into the vagina once nightly x 7 nights (Patient not taking: Reported on 06/26/2019)  °• cyanocobalamin (,VITAMIN B-12,) 1000 MCG/ML injection Inject into the muscle.  °• cyclobenzaprine (FLEXERIL) 10 MG tablet TAKE 1 TABLET BY MOUTH THREE TIMES DAILY AS NEEDED FOR MUSCLE SPASMS (Patient not taking: Reported on 06/26/2019)  °• Dulaglutide (TRULICITY) 1.5 MG/0.5ML SOPN inject 1.5 MG into SKIN ONCE EVERY WEEK  °• DULoxetine (CYMBALTA) 60 MG capsule Take 1 capsule (60 mg total) by mouth daily.  °• fluconazole (DIFLUCAN) 150 MG tablet Take 1 tablet (150 mg total) by mouth once for 1 dose.  °• fluticasone (FLONASE) 50 MCG/ACT nasal spray Place 2 sprays into both nostrils daily. (Patient not taking: Reported on 06/26/2019)  °• glucose monitoring kit (FREESTYLE) monitoring kit Check glucose 4 times per day for insulin dependent diabetes with  complications. Fill test strips #100 and Lancets #100 with PRN refills.  °• Insulin Glargine (BASAGLAR KWIKPEN) 100 UNIT/ML SOPN Inject 0.2 mLs (20 Units total) into the skin at bedtime.  °• Insulin Lispro (HUMALOG KWIKPEN) 200 UNIT/ML SOPN Inject 3-20 Units into the skin 4 (four) times daily -  before meals and at bedtime.  °• Insulin Pen Needle (GLOBAL EASE INJECT PEN NEEDLES) 31G X 5 MM MISC USE FOUR TIMES DAILY  °• levocetirizine (XYZAL) 5 MG tablet Take 1 tablet (5 mg total) by mouth every evening. (Patient not taking: Reported on 06/26/2019)  °• metFORMIN (GLUCOPHAGE) 500 MG tablet Take 2 tablets (1,000 mg total) by mouth daily.  °• naloxegol oxalate (MOVANTIK) 25 MG TABS tablet Take 1 tablet (25 mg total) by mouth   daily. (Patient not taking: Reported on 06/26/2019)  °• NUCYNTA ER 150 MG TB12 Take 1 tablet by mouth every 12 (twelve) hours.  °• OXcarbazepine (TRILEPTAL) 150 MG tablet TAKE ONE TABLET BY MOUTH EVERY MORNING AND TAKE TWO TABLETS BY MOUTH AT BEDTIME  °• oxyCODONE-acetaminophen (PERCOCET) 10-325 MG tablet Take 1 tablet by mouth every 8 (eight) hours as needed for pain.  °• oxyCODONE-acetaminophen (PERCOCET) 10-325 MG tablet Take 1 tablet by mouth every 8 (eight) hours as needed for pain.  °• oxyCODONE-acetaminophen (PERCOCET) 10-325 MG tablet Take 1 tablet by mouth every 8 (eight) hours as needed for pain.  °• pregabalin (LYRICA) 150 MG capsule TAKE 1 CAPSULE BY MOUTH THREE TIMES DAILY  °• rosuvastatin (CRESTOR) 5 MG tablet Take 1 tablet (5 mg total) by mouth at bedtime.  °• sulfamethoxazole-trimethoprim (BACTRIM DS) 800-160 MG tablet Take 1 tablet by mouth 2 (two) times daily. (Patient not taking: Reported on 06/26/2019)  °• [DISCONTINUED] fluconazole (DIFLUCAN) 150 MG tablet TAKE 1 TABLET BY MOUTH FOR ONE DOSE (Patient not taking: Reported on 06/26/2019)  ° °Facility-Administered Encounter Medications as of 08/01/2019  °Medication  °• cyanocobalamin ((VITAMIN B-12)) injection 1,000 mcg  ° ° °Allergies    °Allergen Reactions  °• Actos [Pioglitazone]   °  Edema °  °• Invokana [Canagliflozin]   °  vaginitis  ° ° °Review of Systems  °Constitutional: Negative for activity change, appetite change, chills, diaphoresis, fatigue, fever and unexpected weight change.  °Respiratory: Negative for shortness of breath.   °Cardiovascular: Negative for chest pain and palpitations.  °Gastrointestinal: Positive for abdominal pain (lower abdominal pressure with voiding). Negative for nausea and vomiting.  °Genitourinary: Positive for dysuria, frequency and urgency. Negative for decreased urine volume, difficulty urinating, dyspareunia, flank pain, hematuria, hesitancy, pelvic pain, vaginal bleeding, vaginal discharge and vaginal pain.  °Musculoskeletal: Negative for back pain and myalgias.  °Neurological: Negative for dizziness, syncope, weakness, light-headedness and headaches.  °Psychiatric/Behavioral: Negative for confusion.  °All other systems reviewed and are negative. ° ° °   ° ° °Observations/Objective: °No vital signs or physical exam, this was a telephone or virtual health encounter.  °Pt alert and oriented, answers all questions appropriately, and able to speak in full sentences.  ° ° °Assessment and Plan: °Rhonda Thomas was seen today for dysuria. ° °Diagnoses and all orders for this visit: ° °Dysuria °Recurrent UTI °Reported symptoms consistent with recurrent UTI. Recently treated with Bactrim. No culture on previous urinalysis. Pt will bring sample to office for testing and culture. Pt aware to increase water intake. No red flags concerning for pyelonephritis, PID, or malignancy. Will initiate below and change if culture warrants. If symptoms persist, will refer to urology. Follow up in 2 weeks for repeat urinalysis.  °-     ciprofloxacin (CIPRO) 500 MG tablet; Take 1 tablet (500 mg total) by mouth 2 (two) times daily for 5 days. °-     Urinalysis, Complete; Future °-     Urine Culture; Future ° °History of candidal  vulvovaginitis °Pt reports yeast infections with antibiotic use, will send in below. Pt aware to use only if she becomes symptomatic.  °-     fluconazole (DIFLUCAN) 150 MG tablet; Take 1 tablet (150 mg total) by mouth once for 1 dose. ° ° ° ° °Follow Up Instructions: °Return in about 2 weeks (around 08/15/2019), or if symptoms worsen or fail to improve, for urine recheck. ° °  °I discussed the assessment and treatment plan with the patient. The patient was provided   an opportunity to ask questions and all were answered. The patient agreed with the plan and demonstrated an understanding of the instructions. °  °The patient was advised to call back or seek an in-person evaluation if the symptoms worsen or if the condition fails to improve as anticipated. ° °The above assessment and management plan was discussed with the patient. The patient verbalized understanding of and has agreed to the management plan. Patient is aware to call the clinic if symptoms persist or worsen. Patient is aware when to return to the clinic for a follow-up visit. Patient educated on when it is appropriate to go to the emergency department.  ° ° °I provided 15 minutes of non-face-to-face time during this encounter. The call started at 1040. The call ended at 1055. The other time was used for coordination of care.  ° ° °Michelle Rakes, FNP-C °[facility] °[address] °[place], [place] [address] °[phone] °08/01/19 ° ° °

## 2019-08-01 NOTE — Addendum Note (Signed)
Addended by: Earlene Plater on: 08/01/2019 01:10 PM   Modules accepted: Orders

## 2019-08-03 LAB — URINE CULTURE

## 2019-08-03 MED ORDER — CEPHALEXIN 500 MG PO CAPS: 500.0000 mg | ORAL_CAPSULE | Freq: Two times a day (BID) | ORAL | 0 refills | Status: AC

## 2019-08-03 MED ORDER — CEFTRIAXONE SODIUM 1 G IJ SOLR
1.0000 g | Freq: Once | INTRAMUSCULAR | Status: AC
  Administered 2019-08-05: 1 g via INTRAMUSCULAR

## 2019-08-03 NOTE — Addendum Note (Signed)
Addended by: Baruch Gouty on: 08/03/2019 09:11 PM   Modules accepted: Orders

## 2019-08-05 ENCOUNTER — Other Ambulatory Visit: Payer: Self-pay

## 2019-08-05 ENCOUNTER — Ambulatory Visit (INDEPENDENT_AMBULATORY_CARE_PROVIDER_SITE_OTHER): Payer: PPO | Admitting: *Deleted

## 2019-08-05 DIAGNOSIS — N390 Urinary tract infection, site not specified: Secondary | ICD-10-CM

## 2019-08-05 NOTE — Progress Notes (Signed)
Pt given Ceftriaxone inj 1 gm Tolerated well

## 2019-08-07 DIAGNOSIS — L97522 Non-pressure chronic ulcer of other part of left foot with fat layer exposed: Secondary | ICD-10-CM | POA: Diagnosis not present

## 2019-08-07 DIAGNOSIS — E11621 Type 2 diabetes mellitus with foot ulcer: Secondary | ICD-10-CM | POA: Diagnosis not present

## 2019-08-07 DIAGNOSIS — M79672 Pain in left foot: Secondary | ICD-10-CM | POA: Diagnosis not present

## 2019-08-08 ENCOUNTER — Other Ambulatory Visit: Payer: Self-pay

## 2019-08-08 ENCOUNTER — Encounter: Payer: Self-pay | Admitting: Psychology

## 2019-08-08 ENCOUNTER — Encounter: Payer: PPO | Attending: Psychology | Admitting: Psychology

## 2019-08-08 DIAGNOSIS — E1140 Type 2 diabetes mellitus with diabetic neuropathy, unspecified: Secondary | ICD-10-CM | POA: Diagnosis not present

## 2019-08-08 DIAGNOSIS — F411 Generalized anxiety disorder: Secondary | ICD-10-CM | POA: Insufficient documentation

## 2019-08-08 DIAGNOSIS — F331 Major depressive disorder, recurrent, moderate: Secondary | ICD-10-CM | POA: Insufficient documentation

## 2019-08-08 DIAGNOSIS — F339 Major depressive disorder, recurrent, unspecified: Secondary | ICD-10-CM | POA: Diagnosis not present

## 2019-08-08 DIAGNOSIS — M5137 Other intervertebral disc degeneration, lumbosacral region: Secondary | ICD-10-CM

## 2019-08-08 NOTE — Progress Notes (Signed)
Patient ID: Rhonda Thomas, female   DOB: 1956-05-07, 63 y.o.   MRN: [id number] [name]   DOB: 1956-09-28  MR Number: [id number]  Location: [facility] [place] [facility] [place], [place] [id number] [place] [address] Dept: [phone]  Start: 9 AM End: 10 AM  Today's visit was 1 hour in person visit that was conducted in my outpatient clinic office with myself and the patient present.  Provider/Observer:     Edgardo Roys PsyD  Chief Complaint:      Chief Complaint  Patient presents with  . Anxiety  . Depression  . Stress  . Pain    Reason For Service:     I have worked with the patient for several years now. She was initially referred here for difficulty coping with the situation involving her children. Her youngest son is at severe difficulties over the years with regard to mood disorder, substance abuse and behavioral problems. Now there are major stressors associated with him again the patient has been essentially overwhelmed and unsure about what to do about. She reports that this has created a lot of stress both at home and at work. On top of that, her diabetes has gotten to the point that she had hammertoe amputated because of that. She has neuropathy as a result of diabetes as well and has had to stay out of her foot for 8 weeks.  The psychosocial stressors are having significant negative impact upon her severe diabetes with neuropathy and loss of toe.    The above reason for service has been reviewed and remains applicable for the current visit.  The patient reports that she has had a little bit less stress recently now that her son is spending much more time with his old girlfriend although the son continues to be a stressor within the household and he comes to the house almost every day.  The patient had been doing better with regard to the wounds on  her foot but she has developed a hammertoe that has created a lesion on the end of the toe and will be having surgery on in the near future.  Interventions Strategy:  Cognitive/behavioral therapeutic interventions and working on issues related to stress and her brittle diabetes/pain issues.  Participation Level:   Active  Participation Quality:  Appropriate      Behavioral Observation:  Well Groomed, Alert, and Appropriate.   Current Psychosocial Factors:   The patient reports that some of the issues around her son have improved slightly since her last visit due to him spending more time with his ex and now new girlfriend.  The patient reports that her son is now gotten a different job and she is hoping that he will not be as dirty all the time..  Content of Session:   Reviewed current symptoms and continue to work on building and improving coping skills.  Current Status:   The patient reports that she has been able to finish her spinal cord stimulator surgery and reports that she is experiencing a 50-60% improvement in her pain levels.     Last Reviewed:   08/08/2019  Goals Addressed Today:    Goals addressed included building better coping skills.  Impression/Diagnosis:   The patient has a long history of attention deficit disorder but due to Maj. psychosocial stressors she also developed clinical depression and anxiety. I think that her attentional problems were  valid prior to the development of these issues that existed her entire life. However, she is in and repeat if and recurrent trauma with regard primarily to her son and to her daughter to a lesser degree in years past.     Edgardo Roys, PsyD 08/08/2019

## 2019-08-10 DIAGNOSIS — Z8631 Personal history of diabetic foot ulcer: Secondary | ICD-10-CM | POA: Insufficient documentation

## 2019-08-10 DIAGNOSIS — L97522 Non-pressure chronic ulcer of other part of left foot with fat layer exposed: Secondary | ICD-10-CM | POA: Insufficient documentation

## 2019-08-10 DIAGNOSIS — E11621 Type 2 diabetes mellitus with foot ulcer: Secondary | ICD-10-CM | POA: Insufficient documentation

## 2019-08-12 DIAGNOSIS — G2581 Restless legs syndrome: Secondary | ICD-10-CM | POA: Diagnosis not present

## 2019-08-12 DIAGNOSIS — E1140 Type 2 diabetes mellitus with diabetic neuropathy, unspecified: Secondary | ICD-10-CM | POA: Diagnosis not present

## 2019-08-12 DIAGNOSIS — G894 Chronic pain syndrome: Secondary | ICD-10-CM | POA: Diagnosis not present

## 2019-08-21 DIAGNOSIS — E11621 Type 2 diabetes mellitus with foot ulcer: Secondary | ICD-10-CM | POA: Diagnosis not present

## 2019-08-21 DIAGNOSIS — L97522 Non-pressure chronic ulcer of other part of left foot with fat layer exposed: Secondary | ICD-10-CM | POA: Diagnosis not present

## 2019-09-06 ENCOUNTER — Telehealth: Payer: Self-pay | Admitting: *Deleted

## 2019-09-06 NOTE — Telephone Encounter (Signed)
Humalog 200unit/ml Quikpen-NonPreferred by insurance  Has the patient tried and had an inadequate treatment response or intolerance to the required number of formulary alternatives below [If yes, then documentation is required for approval.] Drug Name and Reason for Failure Note: Formulary Alternatives should be prescribed first unless the patient is unable to use or receive treatment with the alternative. Required Formulary Alternatives: 2 in a class with 2 alternatives FIASP, NOVOLOG  I see the pt has tried Novolog but not the FIASP and she has to try and fail both

## 2019-09-09 ENCOUNTER — Other Ambulatory Visit: Payer: Self-pay | Admitting: Physician Assistant

## 2019-09-09 MED ORDER — FIASP FLEXTOUCH 100 UNIT/ML ~~LOC~~ SOPN: 3.0000 [IU] | PEN_INJECTOR | Freq: Four times a day (QID) | SUBCUTANEOUS | 5 refills | Status: DC

## 2019-09-09 NOTE — Telephone Encounter (Signed)
Can you let the patient know that I sent in the [place] ASP with the same instructions that she has been using on her Humalog?  Please let us know if there are any difficulties.  And this explained that this was a requirement of her insurance.

## 2019-09-09 NOTE — Telephone Encounter (Signed)
Pt aware new rx sent over and that it was changed due to insurance requiring her to try and fail both alternatives.

## 2019-09-09 NOTE — Progress Notes (Signed)
ia

## 2019-09-11 ENCOUNTER — Other Ambulatory Visit: Payer: Self-pay | Admitting: Physician Assistant

## 2019-09-11 DIAGNOSIS — E1140 Type 2 diabetes mellitus with diabetic neuropathy, unspecified: Secondary | ICD-10-CM

## 2019-09-11 DIAGNOSIS — G2581 Restless legs syndrome: Secondary | ICD-10-CM

## 2019-09-11 DIAGNOSIS — E11621 Type 2 diabetes mellitus with foot ulcer: Secondary | ICD-10-CM | POA: Diagnosis not present

## 2019-09-11 DIAGNOSIS — M2042 Other hammer toe(s) (acquired), left foot: Secondary | ICD-10-CM | POA: Diagnosis not present

## 2019-09-11 DIAGNOSIS — L97522 Non-pressure chronic ulcer of other part of left foot with fat layer exposed: Secondary | ICD-10-CM | POA: Diagnosis not present

## 2019-10-01 DIAGNOSIS — Z01818 Encounter for other preprocedural examination: Secondary | ICD-10-CM | POA: Diagnosis not present

## 2019-10-04 DIAGNOSIS — E119 Type 2 diabetes mellitus without complications: Secondary | ICD-10-CM | POA: Diagnosis not present

## 2019-10-04 DIAGNOSIS — Z9682 Presence of neurostimulator: Secondary | ICD-10-CM | POA: Diagnosis not present

## 2019-10-04 DIAGNOSIS — Z794 Long term (current) use of insulin: Secondary | ICD-10-CM | POA: Diagnosis not present

## 2019-10-04 DIAGNOSIS — L97529 Non-pressure chronic ulcer of other part of left foot with unspecified severity: Secondary | ICD-10-CM | POA: Diagnosis not present

## 2019-10-04 DIAGNOSIS — Z981 Arthrodesis status: Secondary | ICD-10-CM | POA: Diagnosis not present

## 2019-10-04 DIAGNOSIS — E11621 Type 2 diabetes mellitus with foot ulcer: Secondary | ICD-10-CM | POA: Diagnosis not present

## 2019-10-04 DIAGNOSIS — F419 Anxiety disorder, unspecified: Secondary | ICD-10-CM | POA: Diagnosis not present

## 2019-10-04 DIAGNOSIS — E785 Hyperlipidemia, unspecified: Secondary | ICD-10-CM | POA: Diagnosis not present

## 2019-10-04 DIAGNOSIS — Z79899 Other long term (current) drug therapy: Secondary | ICD-10-CM | POA: Diagnosis not present

## 2019-10-04 DIAGNOSIS — M2042 Other hammer toe(s) (acquired), left foot: Secondary | ICD-10-CM | POA: Diagnosis not present

## 2019-10-04 DIAGNOSIS — Z7982 Long term (current) use of aspirin: Secondary | ICD-10-CM | POA: Diagnosis not present

## 2019-10-04 DIAGNOSIS — G4730 Sleep apnea, unspecified: Secondary | ICD-10-CM | POA: Diagnosis not present

## 2019-10-07 DIAGNOSIS — G2581 Restless legs syndrome: Secondary | ICD-10-CM | POA: Diagnosis not present

## 2019-10-07 DIAGNOSIS — G894 Chronic pain syndrome: Secondary | ICD-10-CM | POA: Diagnosis not present

## 2019-10-07 DIAGNOSIS — M79671 Pain in right foot: Secondary | ICD-10-CM | POA: Diagnosis not present

## 2019-10-07 DIAGNOSIS — E1140 Type 2 diabetes mellitus with diabetic neuropathy, unspecified: Secondary | ICD-10-CM | POA: Diagnosis not present

## 2019-10-08 ENCOUNTER — Encounter: Payer: PPO | Attending: Psychology | Admitting: Psychology

## 2019-10-08 DIAGNOSIS — F411 Generalized anxiety disorder: Secondary | ICD-10-CM | POA: Insufficient documentation

## 2019-10-08 DIAGNOSIS — F331 Major depressive disorder, recurrent, moderate: Secondary | ICD-10-CM | POA: Insufficient documentation

## 2019-10-08 DIAGNOSIS — E1140 Type 2 diabetes mellitus with diabetic neuropathy, unspecified: Secondary | ICD-10-CM | POA: Insufficient documentation

## 2019-10-08 DIAGNOSIS — F339 Major depressive disorder, recurrent, unspecified: Secondary | ICD-10-CM | POA: Insufficient documentation

## 2019-10-09 DIAGNOSIS — L570 Actinic keratosis: Secondary | ICD-10-CM | POA: Diagnosis not present

## 2019-10-09 DIAGNOSIS — D1801 Hemangioma of skin and subcutaneous tissue: Secondary | ICD-10-CM | POA: Diagnosis not present

## 2019-10-09 DIAGNOSIS — L821 Other seborrheic keratosis: Secondary | ICD-10-CM | POA: Diagnosis not present

## 2019-10-10 ENCOUNTER — Other Ambulatory Visit: Payer: Self-pay | Admitting: Physician Assistant

## 2019-10-10 DIAGNOSIS — E785 Hyperlipidemia, unspecified: Secondary | ICD-10-CM

## 2019-10-18 DIAGNOSIS — Z794 Long term (current) use of insulin: Secondary | ICD-10-CM | POA: Diagnosis not present

## 2019-10-18 DIAGNOSIS — L03116 Cellulitis of left lower limb: Secondary | ICD-10-CM | POA: Diagnosis not present

## 2019-10-18 DIAGNOSIS — E119 Type 2 diabetes mellitus without complications: Secondary | ICD-10-CM | POA: Diagnosis not present

## 2019-10-18 DIAGNOSIS — Z89421 Acquired absence of other right toe(s): Secondary | ICD-10-CM | POA: Diagnosis not present

## 2019-10-18 DIAGNOSIS — L539 Erythematous condition, unspecified: Secondary | ICD-10-CM | POA: Diagnosis not present

## 2019-10-18 DIAGNOSIS — Z89422 Acquired absence of other left toe(s): Secondary | ICD-10-CM | POA: Diagnosis not present

## 2019-10-18 DIAGNOSIS — Z79899 Other long term (current) drug therapy: Secondary | ICD-10-CM | POA: Diagnosis not present

## 2019-10-18 DIAGNOSIS — L0390 Cellulitis, unspecified: Secondary | ICD-10-CM | POA: Diagnosis not present

## 2019-10-18 DIAGNOSIS — E785 Hyperlipidemia, unspecified: Secondary | ICD-10-CM | POA: Diagnosis not present

## 2019-10-18 DIAGNOSIS — M7989 Other specified soft tissue disorders: Secondary | ICD-10-CM | POA: Diagnosis not present

## 2019-10-30 DIAGNOSIS — Z4789 Encounter for other orthopedic aftercare: Secondary | ICD-10-CM | POA: Diagnosis not present

## 2019-11-10 ENCOUNTER — Other Ambulatory Visit: Payer: Self-pay | Admitting: Physician Assistant

## 2019-11-10 DIAGNOSIS — L97521 Non-pressure chronic ulcer of other part of left foot limited to breakdown of skin: Secondary | ICD-10-CM

## 2019-11-11 ENCOUNTER — Encounter: Payer: PPO | Attending: Psychology | Admitting: Psychology

## 2019-11-11 ENCOUNTER — Encounter: Payer: Self-pay | Admitting: Psychology

## 2019-11-11 ENCOUNTER — Other Ambulatory Visit: Payer: Self-pay

## 2019-11-11 DIAGNOSIS — F331 Major depressive disorder, recurrent, moderate: Secondary | ICD-10-CM | POA: Diagnosis not present

## 2019-11-11 DIAGNOSIS — E1140 Type 2 diabetes mellitus with diabetic neuropathy, unspecified: Secondary | ICD-10-CM | POA: Insufficient documentation

## 2019-11-11 DIAGNOSIS — E1142 Type 2 diabetes mellitus with diabetic polyneuropathy: Secondary | ICD-10-CM

## 2019-11-11 DIAGNOSIS — F411 Generalized anxiety disorder: Secondary | ICD-10-CM | POA: Diagnosis not present

## 2019-11-11 DIAGNOSIS — F339 Major depressive disorder, recurrent, unspecified: Secondary | ICD-10-CM | POA: Diagnosis not present

## 2019-11-11 NOTE — Progress Notes (Signed)
Patient ID: Rhonda Thomas, female   DOB: [birth year], 63 y.o.   MRN: [id number] [name]   DOB: [birth year]  MR Number: [id number]  Location: [facility] [place] [facility] [place], [place] [id number] [place] [address] Dept: [phone]  Start: 1 PM End: 2 PM  Today's visit was 1 hour in person visit that was conducted in my outpatient clinic office with myself and the patient present.  Provider/Observer:     Edgardo Roys PsyD  Chief Complaint:      Chief Complaint  Patient presents with  . Anxiety  . Depression  . Stress    Reason For Service:     I have worked with the patient for several years now. She was initially referred here for difficulty coping with the situation involving her children. Her youngest son is at severe difficulties over the years with regard to mood disorder, substance abuse and behavioral problems. Now there are major stressors associated with him again the patient has been essentially overwhelmed and unsure about what to do about. She reports that this has created a lot of stress both at home and at work. On top of that, her diabetes has gotten to the point that she had hammertoe amputated because of that. She has neuropathy as a result of diabetes as well and has had to stay out of her foot for 8 weeks.  The psychosocial stressors are having significant negative impact upon her severe diabetes with neuropathy and loss of toe.  The patient continues to have recurrent issues with residual effects of her type 2 diabetes with significant medical complications.    The reason for service has been reviewed and remains applicable for the current visit.  The patient reports that there has been some improvement in stress with relation to her son to some degree.  It turns out the patient had been abusing Xanax and came forward eventually and sought  inpatient hospitalization for.  However, he only stayed in the treatment facility for 4 days before he was discharged.  The patient's son was improved once he returned home but now is becoming more agitated again.  Interventions Strategy:  Cognitive/behavioral therapeutic interventions and working on issues related to stress and her brittle diabetes/pain issues.  Participation Level:   Active  Participation Quality:  Appropriate      Behavioral Observation:  Well Groomed, Alert, and Appropriate.   Current Psychosocial Factors:   The patient reports that one of her major stressor is now have more to do with a neighbor than they do her son.  However, there is always the concern that the patient's son will become more dysfunctional that is usually associated with his substance abuse and/or underlying psychiatric illness.  Content of Session:   Reviewed current symptoms and continue to work on building and improving coping skills.  Current Status:   The patient reports that she has been able to finish her spinal cord stimulator surgery and reports that she is experiencing a 50-60% improvement in her pain levels.     Last Reviewed:   11/11/2019  Goals Addressed Today:    Goals addressed included building better coping skills.  Impression/Diagnosis:   The patient has a long history of attention deficit disorder but due to Maj. psychosocial stressors she also developed clinical depression and anxiety. I think that her attentional problems were valid prior to the development of these issues that  existed her entire life. However, she is in and repeat if and recurrent trauma with regard primarily to her son and to her daughter to a lesser degree in years past.     Edgardo Roys, [place] 11/11/2019

## 2019-11-21 ENCOUNTER — Other Ambulatory Visit: Payer: Self-pay

## 2019-11-21 ENCOUNTER — Ambulatory Visit (INDEPENDENT_AMBULATORY_CARE_PROVIDER_SITE_OTHER): Payer: PPO

## 2019-11-21 DIAGNOSIS — E538 Deficiency of other specified B group vitamins: Secondary | ICD-10-CM | POA: Diagnosis not present

## 2019-11-21 DIAGNOSIS — Z23 Encounter for immunization: Secondary | ICD-10-CM

## 2019-11-21 MED ORDER — CYANOCOBALAMIN 1000 MCG/ML IJ SOLN
1000.0000 ug | INTRAMUSCULAR | Status: DC
  Administered 2019-11-21: 11:00:00 1000 ug via INTRAMUSCULAR

## 2019-11-21 NOTE — Progress Notes (Signed)
Cyanocobalamin injection given to right deltoid.  Patient tolerated well. 

## 2019-12-09 ENCOUNTER — Encounter: Payer: Self-pay | Admitting: Psychology

## 2019-12-09 ENCOUNTER — Encounter: Payer: PPO | Attending: Psychology | Admitting: Psychology

## 2019-12-09 ENCOUNTER — Other Ambulatory Visit: Payer: Self-pay

## 2019-12-09 DIAGNOSIS — M5137 Other intervertebral disc degeneration, lumbosacral region: Secondary | ICD-10-CM | POA: Diagnosis not present

## 2019-12-09 DIAGNOSIS — F411 Generalized anxiety disorder: Secondary | ICD-10-CM

## 2019-12-09 DIAGNOSIS — E1142 Type 2 diabetes mellitus with diabetic polyneuropathy: Secondary | ICD-10-CM | POA: Diagnosis not present

## 2019-12-09 DIAGNOSIS — E1140 Type 2 diabetes mellitus with diabetic neuropathy, unspecified: Secondary | ICD-10-CM | POA: Insufficient documentation

## 2019-12-09 DIAGNOSIS — F339 Major depressive disorder, recurrent, unspecified: Secondary | ICD-10-CM | POA: Insufficient documentation

## 2019-12-09 DIAGNOSIS — F331 Major depressive disorder, recurrent, moderate: Secondary | ICD-10-CM | POA: Diagnosis not present

## 2019-12-09 DIAGNOSIS — M51379 Other intervertebral disc degeneration, lumbosacral region without mention of lumbar back pain or lower extremity pain: Secondary | ICD-10-CM

## 2019-12-09 NOTE — Progress Notes (Signed)
Patient ID: Rhonda Thomas, female   DOB: 09-Jan-1956, 63 y.o.   MRN: [id number] [name]   DOB: [birth year]  MR Number: [id number]  Location: [facility] [place] [facility] RE[facility] [place], [place] [id number] [place] [address] Dept: [phone]  Start: 1 PM End: 2 PM  Today's visit was 1 hour in person visit that was conducted in my outpatient clinic office with myself and the patient present.  Provider/Observer:     Edgardo Roys PsyD  Chief Complaint:      Chief Complaint  Patient presents with  . Anxiety  . Depression  . Pain  . Peripheral Neuropathy    Reason For Service:     I have worked with the patient for several years now. She was initially referred here for difficulty coping with the situation involving her children. Her youngest son is at severe difficulties over the years with regard to mood disorder, substance abuse and behavioral problems. Now there are major stressors associated with him again the patient has been essentially overwhelmed and unsure about what to do about. She reports that this has created a lot of stress both at home and at work. On top of that, her diabetes has gotten to the point that she had hammertoe amputated because of that. She has neuropathy as a result of diabetes as well and has had to stay out of her foot for 8 weeks.  The psychosocial stressors are having significant negative impact upon her severe diabetes with neuropathy and loss of toe.  The patient continues to have recurrent issues with residual effects of her type 2 diabetes with significant medical complications.  The reason for service has been reviewed and remains applicable for the current visit.  The patient reports that some of the stressors around her son have improved somewhat but there continue to be a lot of stressors around that that added to her anxiety and  depressive symptomatology.  The patient reports that she is doing well managing her severe brittle diabetes.  Interventions Strategy:  Cognitive/behavioral therapeutic interventions and working on issues related to stress and her brittle diabetes/pain issues.  Participation Level:   Active  Participation Quality:  Appropriate      Behavioral Observation:  Well Groomed, Alert, and Appropriate.   Current Psychosocial Factors:   The patient reports that one of her major stressor is now have more to do with a neighbor than they do her son.  However, there is always the concern that the patient's son will become more dysfunctional that is usually associated with his substance abuse and/or underlying psychiatric illness.  Content of Session:   Reviewed current symptoms and continue to work on building and improving coping skills.  Current Status:   The patient reports that she has been able to finish her spinal cord stimulator surgery and reports that she is experiencing a 50-60% improvement in her pain levels.     Last Reviewed:   12/09/2019  Goals Addressed Today:    Goals addressed included building better coping skills.  Impression/Diagnosis:   The patient has a long history of attention deficit disorder but due to Maj. psychosocial stressors she also developed clinical depression and anxiety. I think that her attentional problems were valid prior to the development of these issues that existed her entire life. However, she is in and repeat if and recurrent trauma with regard primarily to her son and to  her daughter to a lesser degree in years past.     Edgardo Roys, PsyD 12/09/2019

## 2019-12-11 ENCOUNTER — Other Ambulatory Visit: Payer: Self-pay | Admitting: Physician Assistant

## 2019-12-18 ENCOUNTER — Other Ambulatory Visit: Payer: Self-pay | Admitting: Physician Assistant

## 2019-12-18 DIAGNOSIS — R300 Dysuria: Secondary | ICD-10-CM

## 2019-12-19 ENCOUNTER — Other Ambulatory Visit: Payer: Self-pay | Admitting: Family Medicine

## 2019-12-19 DIAGNOSIS — Z8619 Personal history of other infectious and parasitic diseases: Secondary | ICD-10-CM

## 2019-12-27 DIAGNOSIS — Z79899 Other long term (current) drug therapy: Secondary | ICD-10-CM | POA: Diagnosis not present

## 2019-12-27 DIAGNOSIS — Z5181 Encounter for therapeutic drug level monitoring: Secondary | ICD-10-CM | POA: Diagnosis not present

## 2019-12-30 DIAGNOSIS — M79671 Pain in right foot: Secondary | ICD-10-CM | POA: Diagnosis not present

## 2019-12-30 DIAGNOSIS — G894 Chronic pain syndrome: Secondary | ICD-10-CM | POA: Diagnosis not present

## 2019-12-30 DIAGNOSIS — G2581 Restless legs syndrome: Secondary | ICD-10-CM | POA: Diagnosis not present

## 2019-12-30 DIAGNOSIS — E1140 Type 2 diabetes mellitus with diabetic neuropathy, unspecified: Secondary | ICD-10-CM | POA: Diagnosis not present

## 2020-01-08 ENCOUNTER — Encounter: Payer: Self-pay | Admitting: Physician Assistant

## 2020-01-08 ENCOUNTER — Other Ambulatory Visit: Payer: Self-pay | Admitting: Physician Assistant

## 2020-01-08 ENCOUNTER — Ambulatory Visit (INDEPENDENT_AMBULATORY_CARE_PROVIDER_SITE_OTHER): Payer: PPO | Admitting: Physician Assistant

## 2020-01-08 DIAGNOSIS — E1049 Type 1 diabetes mellitus with other diabetic neurological complication: Secondary | ICD-10-CM | POA: Diagnosis not present

## 2020-01-08 DIAGNOSIS — E1142 Type 2 diabetes mellitus with diabetic polyneuropathy: Secondary | ICD-10-CM | POA: Diagnosis not present

## 2020-01-08 DIAGNOSIS — E785 Hyperlipidemia, unspecified: Secondary | ICD-10-CM

## 2020-01-08 DIAGNOSIS — Z0000 Encounter for general adult medical examination without abnormal findings: Secondary | ICD-10-CM | POA: Diagnosis not present

## 2020-01-08 MED ORDER — PREGABALIN 150 MG PO CAPS: ORAL_CAPSULE | ORAL | 5 refills | Status: DC

## 2020-01-08 MED ORDER — TRULICITY 1.5 MG/0.5ML ~~LOC~~ SOAJ: SUBCUTANEOUS | 6 refills | Status: DC

## 2020-01-08 MED ORDER — FLUCONAZOLE 150 MG PO TABS: ORAL_TABLET | ORAL | 5 refills | Status: DC

## 2020-01-08 MED ORDER — ROSUVASTATIN CALCIUM 5 MG PO TABS: 5.0000 mg | ORAL_TABLET | Freq: Every day | ORAL | 1 refills | Status: DC

## 2020-01-08 MED ORDER — METFORMIN HCL 500 MG PO TABS: 1000.0000 mg | ORAL_TABLET | Freq: Every day | ORAL | 3 refills | Status: DC

## 2020-01-08 MED ORDER — OXCARBAZEPINE 150 MG PO TABS: ORAL_TABLET | ORAL | 5 refills | Status: DC

## 2020-01-08 NOTE — Telephone Encounter (Signed)
[place]. NTBS 30 days given 12/19/19

## 2020-01-08 NOTE — Progress Notes (Signed)
[phone]  [phone]     Telephone visit  Subjective: CC: Chronic medical conditions PCP: Terald Sleeper, PA-C YSA:Rhonda Thomas is a 64 y.o. female calls for telephone consult today. Patient provides verbal consent for consult held via phone.  Patient is identified with 2 separate identifiers.  At this time the entire area is on COVID-19 social distancing and stay home orders are in place.  Patient is of higher risk and therefore we are performing this by a virtual method.  Location of patient: Home Location of provider: HOME Others present for call: No  Patient is having a follow-up for her chronic medical conditions which do include degenerative disc disease, type 1 diabetes, peripheral neuropathy secondary to diabetes.  Patient is still under care of neurology and podiatry.  She states that at this time she is fairly well-healed and not having any difficulty.  The patient also does have B12 deficiency.  I have encouraged her to try to keep tabs up with this and keep up on her injections.  This can worsen her neuropathy.  The she reports that she does need some refills on some of her regular medications.  We will have her come in for labs at her earliest convenience.  Medications that she is taking seem to be helping and they do include duloxetine, pregabalin.  She is on Nucynta through pain management.   ROS: Per HPI  Allergies  Allergen Reactions  . Actos [Pioglitazone]     Edema   . Invokana [Canagliflozin]     vaginitis   Past Medical History:  Diagnosis Date  . Anxiety   . Attention deficit disorder (ADD)   . Depression   . Diabetes ([place])   . Hiatal hernia   . Movement disorder   . Neuropathy   . Schatzki's ring   . Sleep apnea     Current Outpatient Medications:  .  ASPIRIN LOW DOSE 81 MG EC tablet, TAKE 1 TABLET BY MOUTH EVERY DAY, Disp: 90 tablet, Rfl: 0 .  carbidopa-levodopa (SINEMET IR) 25-100 MG tablet, TAKE 1 TABLET BY MOUTH THREE TIMES DAILY, Disp: 90  tablet, Rfl: 11 .  Dulaglutide (TRULICITY) 1.5 WF/0.9NA SOPN, inject 1.5 MG into SKIN ONCE EVERY WEEK, Disp: 2 mL, Rfl: 6 .  DULoxetine (CYMBALTA) 60 MG capsule, TAKE ONE CAPSULE BY MOUTH DAILY, Disp: 30 capsule, Rfl: 11 .  fluconazole (DIFLUCAN) 150 MG tablet, 1 po q week x 4 weeks, Disp: 4 tablet, Rfl: 5 .  glucose monitoring kit (FREESTYLE) monitoring kit, Check glucose 4 times per day for insulin dependent diabetes with complications. Fill test strips #100 and Lancets #100 with PRN refills., Disp: 1 each, Rfl: 11 .  Insulin Glargine (BASAGLAR KWIKPEN) 100 UNIT/ML SOPN, Inject 0.2 mLs (20 Units total) into the skin at bedtime., Disp: 3 pen, Rfl: 5 .  Insulin Lispro (HUMALOG KWIKPEN) 200 UNIT/ML SOPN, Inject 3-20 Units into the skin 4 (four) times daily -  before meals and at bedtime., Disp: 15 mL, Rfl: 5 .  Insulin Pen Needle (GLOBAL EASE INJECT PEN NEEDLES) 31G X 5 MM MISC, USE FOUR TIMES DAILY, Disp: 100 each, Rfl: 9 .  metFORMIN (GLUCOPHAGE) 500 MG tablet, Take 2 tablets (1,000 mg total) by mouth daily., Disp: 180 tablet, Rfl: 3 .  NUCYNTA ER 150 MG TB12, Take 1 tablet by mouth every 12 (twelve) hours., Disp: , Rfl:  .  OXcarbazepine (TRILEPTAL) 150 MG tablet, TAKE ONE TABLET BY MOUTH EVERY MORNING AND TAKE TWO TABLETS BY MOUTH  AT BEDTIME, Disp: 90 tablet, Rfl: 5 .  pregabalin (LYRICA) 150 MG capsule, TAKE ONE CAPSULE BY MOUTH THREE TIMES DAILY, Disp: 90 capsule, Rfl: 5 .  rosuvastatin (CRESTOR) 5 MG tablet, Take 1 tablet (5 mg total) by mouth at bedtime., Disp: 90 tablet, Rfl: 1  Current Facility-Administered Medications:  .  cyanocobalamin ((VITAMIN B-12)) injection 1,000 mcg, 1,000 mcg, Intramuscular, Q30 days, Particia Nearing S, PA-C, 1,000 mcg at 11/21/19 1118  Assessment/ Plan: 64 y.o. female   1. Type 1 diabetes mellitus with other neurologic complication (HCC) - rosuvastatin (CRESTOR) 5 MG tablet; Take 1 tablet (5 mg total) by mouth at bedtime.  Dispense: 90 tablet; Refill: 1 -  pregabalin (LYRICA) 150 MG capsule; TAKE ONE CAPSULE BY MOUTH THREE TIMES DAILY  Dispense: 90 capsule; Refill: 5 - metFORMIN (GLUCOPHAGE) 500 MG tablet; Take 2 tablets (1,000 mg total) by mouth daily.  Dispense: 180 tablet; Refill: 3 - Dulaglutide (TRULICITY) 1.5 OB/0.9GG SOPN; inject 1.5 MG into SKIN ONCE EVERY WEEK  Dispense: 2 mL; Refill: 6 - CMP14+EGFR; Future - CBC with Differential/Platelet; Future - Lipid panel; Future - Bayer DCA Hb A1c Waived; Future - Microalbumin / creatinine urine ratio; Future  2. Diabetic peripheral neuropathy (HCC) - OXcarbazepine (TRILEPTAL) 150 MG tablet; TAKE ONE TABLET BY MOUTH EVERY MORNING AND TAKE TWO TABLETS BY MOUTH AT BEDTIME  Dispense: 90 tablet; Refill: 5  3. Well adult exam - CMP14+EGFR; Future - CBC with Differential/Platelet; Future - Lipid panel; Future   No follow-ups on file.  Continue all other maintenance medications as listed above.  Start time: 3:30 PM End time: 3:44 PM  Meds ordered this encounter  Medications  . rosuvastatin (CRESTOR) 5 MG tablet    Sig: Take 1 tablet (5 mg total) by mouth at bedtime.    Dispense:  90 tablet    Refill:  1    Order Specific Question:   Supervising Provider    Answer:   Janora Norlander [[id number]]  . pregabalin (LYRICA) 150 MG capsule    Sig: TAKE ONE CAPSULE BY MOUTH THREE TIMES DAILY    Dispense:  90 capsule    Refill:  5    Order Specific Question:   Supervising Provider    Answer:   Janora Norlander [[id number]]  . metFORMIN (GLUCOPHAGE) 500 MG tablet    Sig: Take 2 tablets (1,000 mg total) by mouth daily.    Dispense:  180 tablet    Refill:  3    Order Specific Question:   Supervising Provider    Answer:   Janora Norlander [[id number]]  . Dulaglutide (TRULICITY) 1.5 LE/7.5TZ SOPN    Sig: inject 1.5 MG into SKIN ONCE EVERY WEEK    Dispense:  2 mL    Refill:  6    Order Specific Question:   Supervising Provider    Answer:   Janora Norlander [[id number]]  . OXcarbazepine  (TRILEPTAL) 150 MG tablet    Sig: TAKE ONE TABLET BY MOUTH EVERY MORNING AND TAKE TWO TABLETS BY MOUTH AT BEDTIME    Dispense:  90 tablet    Refill:  5    Order Specific Question:   Supervising Provider    Answer:   Janora Norlander [[id number]]  . fluconazole (DIFLUCAN) 150 MG tablet    Sig: 1 po q week x 4 weeks    Dispense:  4 tablet    Refill:  5    Order Specific Question:   Supervising Provider  Answer:   Janora Norlander [[id number]]    Particia Nearing PA-C [place] [phone]

## 2020-01-08 NOTE — Telephone Encounter (Signed)
Appointment scheduled.

## 2020-01-14 ENCOUNTER — Other Ambulatory Visit: Payer: PPO

## 2020-01-14 ENCOUNTER — Other Ambulatory Visit: Payer: Self-pay

## 2020-01-14 DIAGNOSIS — Z0000 Encounter for general adult medical examination without abnormal findings: Secondary | ICD-10-CM | POA: Diagnosis not present

## 2020-01-14 DIAGNOSIS — E1049 Type 1 diabetes mellitus with other diabetic neurological complication: Secondary | ICD-10-CM

## 2020-01-14 LAB — BAYER DCA HB A1C WAIVED: HB A1C (BAYER DCA - WAIVED): 7.2 % — ABNORMAL HIGH (ref <7.0)

## 2020-01-15 LAB — CBC WITH DIFFERENTIAL/PLATELET
Basophils Absolute: 0 10*3/uL (ref 0.0–0.2)
Basos: 1 %
EOS (ABSOLUTE): 0 10*3/uL (ref 0.0–0.4)
Eos: 0 %
Hematocrit: 40.5 % (ref 34.0–46.6)
Hemoglobin: 13.6 g/dL (ref 11.1–15.9)
Immature Grans (Abs): 0 10*3/uL (ref 0.0–0.1)
Immature Granulocytes: 0 %
Lymphocytes Absolute: 2.6 10*3/uL (ref 0.7–3.1)
Lymphs: 40 %
MCH: 29.6 pg (ref 26.6–33.0)
MCHC: 33.6 g/dL (ref 31.5–35.7)
MCV: 88 fL (ref 79–97)
Monocytes Absolute: 0.4 10*3/uL (ref 0.1–0.9)
Monocytes: 7 %
Neutrophils Absolute: 3.4 10*3/uL (ref 1.4–7.0)
Neutrophils: 52 %
Platelets: 198 10*3/uL (ref 150–450)
RBC: 4.59 x10E6/uL (ref 3.77–5.28)
RDW: 13.2 % (ref 11.7–15.4)
WBC: 6.6 10*3/uL (ref 3.4–10.8)

## 2020-01-15 LAB — LIPID PANEL
Chol/HDL Ratio: 2.2 ratio (ref 0.0–4.4)
Cholesterol, Total: 129 mg/dL (ref 100–199)
HDL: 58 mg/dL (ref >39)
LDL Chol Calc (NIH): 52 mg/dL (ref 0–99)
Triglycerides: 106 mg/dL (ref 0–149)
VLDL Cholesterol Cal: 19 mg/dL (ref 5–40)

## 2020-01-15 LAB — MICROALBUMIN / CREATININE URINE RATIO
Creatinine, Urine: 179.3 mg/dL
Microalb/Creat Ratio: 6 mg/g creat (ref 0–29)
Microalbumin, Urine: 10.1 ug/mL

## 2020-01-15 LAB — CMP14+EGFR
ALT: 17 IU/L (ref 0–32)
AST: 11 IU/L (ref 0–40)
Albumin/Globulin Ratio: 2 (ref 1.2–2.2)
Albumin: 4 g/dL (ref 3.8–4.8)
Alkaline Phosphatase: 87 IU/L (ref 39–117)
BUN/Creatinine Ratio: 27 (ref 12–28)
BUN: 18 mg/dL (ref 8–27)
Bilirubin Total: 0.2 mg/dL (ref 0.0–1.2)
CO2: 25 mmol/L (ref 20–29)
Calcium: 9 mg/dL (ref 8.7–10.3)
Chloride: 103 mmol/L (ref 96–106)
Creatinine, Ser: 0.66 mg/dL (ref 0.57–1.00)
GFR calc Af Amer: 109 mL/min/{1.73_m2} (ref >59)
GFR calc non Af Amer: 94 mL/min/{1.73_m2} (ref >59)
Globulin, Total: 2 g/dL (ref 1.5–4.5)
Glucose: 76 mg/dL (ref 65–99)
Potassium: 4.2 mmol/L (ref 3.5–5.2)
Sodium: 141 mmol/L (ref 134–144)
Total Protein: 6 g/dL (ref 6.0–8.5)

## 2020-01-22 ENCOUNTER — Other Ambulatory Visit: Payer: Self-pay | Admitting: Physician Assistant

## 2020-01-22 DIAGNOSIS — E1049 Type 1 diabetes mellitus with other diabetic neurological complication: Secondary | ICD-10-CM

## 2020-01-27 ENCOUNTER — Other Ambulatory Visit: Payer: Self-pay | Admitting: Physician Assistant

## 2020-01-27 ENCOUNTER — Telehealth: Payer: Self-pay | Admitting: Physician Assistant

## 2020-01-27 DIAGNOSIS — E1049 Type 1 diabetes mellitus with other diabetic neurological complication: Secondary | ICD-10-CM

## 2020-01-27 MED ORDER — APIDRA SOLOSTAR 100 UNIT/ML ~~LOC~~ SOPN: 3.0000 [IU] | PEN_INJECTOR | Freq: Three times a day (TID) | SUBCUTANEOUS | 11 refills | Status: DC

## 2020-01-27 MED ORDER — NOVOLOG FLEXPEN 100 UNIT/ML ~~LOC~~ SOPN: 3.0000 [IU] | PEN_INJECTOR | Freq: Two times a day (BID) | SUBCUTANEOUS | 11 refills | Status: DC | PRN

## 2020-01-27 NOTE — Telephone Encounter (Signed)
I sent a prescription of Apidra with the fast acting insulin in the pen form to her pharmacy.  Discontinue Humalog

## 2020-01-27 NOTE — Progress Notes (Signed)
novo

## 2020-01-27 NOTE — Telephone Encounter (Signed)
Why can't the pharmacy just tell us that when they call the first time? They  are the ones who initiated the call.  Sent

## 2020-01-27 NOTE — Telephone Encounter (Signed)
Patient aware.

## 2020-01-28 ENCOUNTER — Encounter: Payer: PPO | Attending: Psychology | Admitting: Psychology

## 2020-01-28 ENCOUNTER — Other Ambulatory Visit: Payer: Self-pay

## 2020-01-28 ENCOUNTER — Encounter: Payer: Self-pay | Admitting: Psychology

## 2020-01-28 DIAGNOSIS — F411 Generalized anxiety disorder: Secondary | ICD-10-CM | POA: Insufficient documentation

## 2020-01-28 DIAGNOSIS — M5137 Other intervertebral disc degeneration, lumbosacral region: Secondary | ICD-10-CM | POA: Diagnosis not present

## 2020-01-28 DIAGNOSIS — H35372 Puckering of macula, left eye: Secondary | ICD-10-CM | POA: Diagnosis not present

## 2020-01-28 DIAGNOSIS — F331 Major depressive disorder, recurrent, moderate: Secondary | ICD-10-CM | POA: Insufficient documentation

## 2020-01-28 DIAGNOSIS — F339 Major depressive disorder, recurrent, unspecified: Secondary | ICD-10-CM | POA: Insufficient documentation

## 2020-01-28 DIAGNOSIS — E1142 Type 2 diabetes mellitus with diabetic polyneuropathy: Secondary | ICD-10-CM

## 2020-01-28 DIAGNOSIS — E113393 Type 2 diabetes mellitus with moderate nonproliferative diabetic retinopathy without macular edema, bilateral: Secondary | ICD-10-CM | POA: Diagnosis not present

## 2020-01-28 DIAGNOSIS — H2511 Age-related nuclear cataract, right eye: Secondary | ICD-10-CM | POA: Diagnosis not present

## 2020-01-28 DIAGNOSIS — E1140 Type 2 diabetes mellitus with diabetic neuropathy, unspecified: Secondary | ICD-10-CM | POA: Diagnosis not present

## 2020-01-28 DIAGNOSIS — H2512 Age-related nuclear cataract, left eye: Secondary | ICD-10-CM | POA: Diagnosis not present

## 2020-01-28 LAB — HM DIABETES EYE EXAM

## 2020-01-28 NOTE — Progress Notes (Signed)
Patient ID: Rhonda Thomas, female   DOB: Apr 19, 1956, 64 y.o.   MRN: [id number] [name]   DOB: 1956-04-12  MR Number: [id number]  Location: [facility] [place] [facility] [place], [place] [id number] [place] [address] Dept: [phone]  Start: 8 AM End: 9 AM  Today's visit is a 1 hour in person visit that was conducted in my outpatient clinic office with the patient myself present.  Provider/Observer:     Edgardo Roys PsyD  Chief Complaint:      Chief Complaint  Patient presents with  . Depression  . Pain    Reason For Service:     I have worked with the patient for several years now. She was initially referred here for difficulty coping with the situation involving her children. Her youngest son is at severe difficulties over the years with regard to mood disorder, substance abuse and behavioral problems. Now there are major stressors associated with him again the patient has been essentially overwhelmed and unsure about what to do about. She reports that this has created a lot of stress both at home and at work. On top of that, her diabetes has gotten to the point that she had hammertoe amputated because of that. She has neuropathy as a result of diabetes as well and has had to stay out of her foot for 8 weeks.  The psychosocial stressors are having significant negative impact upon her severe diabetes with neuropathy and loss of toe.  The patient continues to have recurrent issues with residual effects of her type 2 diabetes with significant medical complications.  The reason for service has been reviewed and remains applicable for the current visit.  The patient reports that some of the stressors around her son have improved somewhat but there continue to be a lot of stressors around that that added to her anxiety and depressive symptomatology.  The patient  reports that she is doing well managing her severe brittle diabetes.  Interventions Strategy:  Cognitive/behavioral therapeutic interventions and working on issues related to stress and her brittle diabetes/pain issues.  Participation Level:   Active  Participation Quality:  Appropriate      Behavioral Observation:  Well Groomed, Alert, and Appropriate.   Current Psychosocial Factors:   The patient reports that her son has now started seeing a woman from [place] who is come down to visit for several weeks now and staying in her house.  The patient reports that this is been somewhat stressful although it has kept her son more reserved overall.  Her son is told her that he is going to go back to [place] with this woman at some point in the near future and while the patient is worried about this unexpected  Content of Session:   Reviewed current symptoms and continue to work on coping skills in dealing with both her medical issues as well as the stressors.  Current Status:   The patient reports that she has been able to finish her spinal cord stimulator surgery and reports that she is experiencing a 50-60% improvement in her pain levels.     Last Reviewed:   01/28/2020  Goals Addressed Today:    Goals addressed included building better coping skills.  Impression/Diagnosis:   The patient has a long history of attention deficit disorder but due to Maj. psychosocial stressors she also developed clinical depression and anxiety. I think that her  attentional problems were valid prior to the development of these issues that existed her entire life. However, she is in and repeat if and recurrent trauma with regard primarily to her son and to her daughter to a lesser degree in years past.     Edgardo Roys, PsyD 01/28/2020

## 2020-02-03 DIAGNOSIS — H35372 Puckering of macula, left eye: Secondary | ICD-10-CM | POA: Diagnosis not present

## 2020-02-03 DIAGNOSIS — H43822 Vitreomacular adhesion, left eye: Secondary | ICD-10-CM | POA: Diagnosis not present

## 2020-02-03 DIAGNOSIS — H35352 Cystoid macular degeneration, left eye: Secondary | ICD-10-CM | POA: Diagnosis not present

## 2020-02-03 DIAGNOSIS — H2511 Age-related nuclear cataract, right eye: Secondary | ICD-10-CM | POA: Diagnosis not present

## 2020-02-03 DIAGNOSIS — E113393 Type 2 diabetes mellitus with moderate nonproliferative diabetic retinopathy without macular edema, bilateral: Secondary | ICD-10-CM | POA: Diagnosis not present

## 2020-02-24 ENCOUNTER — Encounter: Payer: PPO | Admitting: Psychology

## 2020-02-27 ENCOUNTER — Telehealth: Payer: Self-pay | Admitting: Physician Assistant

## 2020-02-27 NOTE — Telephone Encounter (Signed)
  Medication Request  02/27/2020  What is the name of the medication? CPAP supplies  Have you contacted your pharmacy to request a refill? Yes  Which pharmacy would you like this sent to? [facility]   Patient notified that their request is being sent to the clinical staff for review and that they should receive a call once it is complete. If they do not receive a call within 24 hours they can check with their pharmacy or our office.   Ronnald Ramp' pt.  Mask, tube, etc.?  She hasn't use it in awhile, but she needs new supplies to her machine. It isn't that old.

## 2020-02-27 NOTE — Chronic Care Management (AMB) (Signed)
  Chronic Care Management   Outreach Note  02/27/2020 Name: Rhonda Thomas MRN: [id number] DOB: [birth year]  Rhonda Thomas is a 64 y.o. year old female who is a primary care patient of Terald Sleeper, PA-C. I reached out to Rhonda Thomas by phone today in response to a referral sent by Rhonda Thomas's health plan.     An unsuccessful telephone outreach was attempted today. The patient was referred to the case management team for assistance with care management and care coordination.   Follow Up Plan: A HIPPA compliant phone message was left for the patient providing contact information and requesting a return call.  The care management team will reach out to the patient again over the next 7 days.  If patient returns call to provider office, please advise to call [facility]  at [place], [place], [place], [place] [address] Direct Dial: [phone] [email or web address] Website: [email or web address]

## 2020-02-28 NOTE — Telephone Encounter (Signed)
Patient states she is going to call neurology office. She states they are the one's who originally done the sleep study.

## 2020-02-28 NOTE — Chronic Care Management (AMB) (Signed)
  Chronic Care Management   Note  02/28/2020 Name: Rhonda Thomas MRN: [id number] DOB: 25-Aug-1956  [name] is a 64 y.o. year old female who is a primary care patient of Terald Sleeper, PA-C. I reached out to [name] by phone today in response to a referral sent by [name]'s health plan.     [name] was given information about Chronic Care Management services today including:  1. CCM service includes personalized support from designated clinical staff supervised by her physician, including individualized plan of care and coordination with other care providers 2. 24/7 contact phone numbers for assistance for urgent and routine care needs. 3. Service will only be billed when office clinical staff spend 20 minutes or more in a month to coordinate care. 4. Only one practitioner may furnish and bill the service in a calendar month. 5. The patient may stop CCM services at any time (effective at the end of the month) by phone call to the office staff. 6. The patient will be responsible for cost sharing (co-pay) of up to 20% of the service fee (after annual deductible is met).  Patient did not agree to enrollment in care management services and does not wish to consider at this time.  Follow up plan: The patient has been provided with contact information for the care management team and has been advised to call with any health related questions or concerns.   Noreene Larsson, [place], [place], [place] [address] Direct Dial: [phone] [email or web address] Website: [email or web address]

## 2020-03-26 DIAGNOSIS — G2581 Restless legs syndrome: Secondary | ICD-10-CM | POA: Diagnosis not present

## 2020-03-26 DIAGNOSIS — E1140 Type 2 diabetes mellitus with diabetic neuropathy, unspecified: Secondary | ICD-10-CM | POA: Diagnosis not present

## 2020-03-26 DIAGNOSIS — M79671 Pain in right foot: Secondary | ICD-10-CM | POA: Diagnosis not present

## 2020-03-26 DIAGNOSIS — G894 Chronic pain syndrome: Secondary | ICD-10-CM | POA: Diagnosis not present

## 2020-03-30 ENCOUNTER — Encounter: Payer: PPO | Attending: Psychology | Admitting: Psychology

## 2020-03-30 ENCOUNTER — Other Ambulatory Visit: Payer: Self-pay

## 2020-03-30 ENCOUNTER — Encounter: Payer: Self-pay | Admitting: Psychology

## 2020-03-30 DIAGNOSIS — F411 Generalized anxiety disorder: Secondary | ICD-10-CM | POA: Insufficient documentation

## 2020-03-30 DIAGNOSIS — M5137 Other intervertebral disc degeneration, lumbosacral region: Secondary | ICD-10-CM | POA: Diagnosis not present

## 2020-03-30 DIAGNOSIS — E1140 Type 2 diabetes mellitus with diabetic neuropathy, unspecified: Secondary | ICD-10-CM | POA: Diagnosis not present

## 2020-03-30 DIAGNOSIS — F331 Major depressive disorder, recurrent, moderate: Secondary | ICD-10-CM | POA: Insufficient documentation

## 2020-03-30 DIAGNOSIS — F339 Major depressive disorder, recurrent, unspecified: Secondary | ICD-10-CM | POA: Insufficient documentation

## 2020-03-30 NOTE — Progress Notes (Signed)
Patient ID: [name], female   DOB: 1956/08/14, 64 y.o.   MRN: [id number] [name]   DOB: 29-Sep-1956  MR Number: [id number]  Location: [facility] [place] PHYSICAL MEDICINE AND REHABILITATION [place], [place] [id number] [place] [address] Dept: [phone]  Start: 3 PM End: 4 PM  Today's visit is a 1 hour in person visit that was conducted in my outpatient clinic office with the patient myself present.  Provider/Observer:     Edgardo Roys PsyD  Chief Complaint:      Chief Complaint  Patient presents with  . Depression  . Anxiety  . Pain    Reason For Service:     I have worked with the patient for several years now. She was initially referred here for difficulty coping with the situation involving her children. Her youngest son is at severe difficulties over the years with regard to mood disorder, substance abuse and behavioral problems. Now there are major stressors associated with him again the patient has been essentially overwhelmed and unsure about what to do about. She reports that this has created a lot of stress both at home and at work. On top of that, her diabetes has gotten to the point that she had hammertoe amputated because of that. She has neuropathy as a result of diabetes as well and has had to stay out of her foot for 8 weeks.  The psychosocial stressors are having significant negative impact upon her severe diabetes with neuropathy and loss of toe.  The patient continues to have recurrent issues with residual effects of her type 2 diabetes with significant medical complications.  The reason for service has been reviewed and remains applicable for the current visit.  The patient reports that some of the stressors around her son have improved somewhat but there continue to be a lot of stressors around that that added to her anxiety and depressive symptomatology.  The  patient reports that she is doing well managing her severe brittle diabetes.  Interventions Strategy:  Cognitive/behavioral therapeutic interventions and working on issues related to stress and her brittle diabetes/pain issues.  Participation Level:   Active  Participation Quality:  Appropriate      Behavioral Observation:  Well Groomed, Alert, and Appropriate.   Current Psychosocial Factors:   The patient reports that her son is now gotten married and has moved to [place].  She reports that this is dramatically reduce the stress at home and she feels like she is managing her blood glucose better with the reduced stress.  The patient reports that she is able to wear a regular shoe now and is hoping to keep any foot ulcers from forming with improved diabetic response.  Content of Session:   Reviewed current symptoms and continue to work on coping skills in dealing with both her medical issues as well as the stressors.  Current Status:   The patient reports that she has been able to finish her spinal cord stimulator surgery and reports that she is experiencing a 50-60% improvement in her pain levels.     Last Reviewed:   03/30/2020  Goals Addressed Today:    Goals addressed included building better coping skills.  Impression/Diagnosis:   The patient has a long history of attention deficit disorder but due to Maj. psychosocial stressors she also developed clinical depression and anxiety. I think that her attentional problems were valid prior to the development of  these issues that existed her entire life. However, she is in and repeat if and recurrent trauma with regard primarily to her son and to her daughter to a lesser degree in years past.     Edgardo Roys, PsyD 03/30/2020

## 2020-04-13 ENCOUNTER — Other Ambulatory Visit: Payer: Self-pay | Admitting: *Deleted

## 2020-04-13 MED ORDER — BASAGLAR KWIKPEN 100 UNIT/ML ~~LOC~~ SOPN: 20.0000 [IU] | PEN_INJECTOR | Freq: Every day | SUBCUTANEOUS | 1 refills | Status: DC

## 2020-04-13 NOTE — Telephone Encounter (Signed)
01/14/20 A1C rck in 6 mos

## 2020-05-04 ENCOUNTER — Encounter: Payer: PPO | Admitting: Psychology

## 2020-05-04 ENCOUNTER — Encounter (INDEPENDENT_AMBULATORY_CARE_PROVIDER_SITE_OTHER): Payer: PPO | Admitting: Ophthalmology

## 2020-05-19 DIAGNOSIS — M86172 Other acute osteomyelitis, left ankle and foot: Secondary | ICD-10-CM | POA: Diagnosis not present

## 2020-05-19 DIAGNOSIS — M79675 Pain in left toe(s): Secondary | ICD-10-CM | POA: Diagnosis not present

## 2020-05-20 DIAGNOSIS — G2581 Restless legs syndrome: Secondary | ICD-10-CM | POA: Diagnosis not present

## 2020-05-20 DIAGNOSIS — G894 Chronic pain syndrome: Secondary | ICD-10-CM | POA: Diagnosis not present

## 2020-05-20 DIAGNOSIS — E1140 Type 2 diabetes mellitus with diabetic neuropathy, unspecified: Secondary | ICD-10-CM | POA: Diagnosis not present

## 2020-05-20 DIAGNOSIS — M79671 Pain in right foot: Secondary | ICD-10-CM | POA: Diagnosis not present

## 2020-06-02 ENCOUNTER — Other Ambulatory Visit: Payer: Self-pay

## 2020-06-02 ENCOUNTER — Encounter: Payer: PPO | Attending: Psychology | Admitting: Psychology

## 2020-06-02 DIAGNOSIS — F331 Major depressive disorder, recurrent, moderate: Secondary | ICD-10-CM

## 2020-06-02 DIAGNOSIS — E1142 Type 2 diabetes mellitus with diabetic polyneuropathy: Secondary | ICD-10-CM | POA: Diagnosis not present

## 2020-06-02 DIAGNOSIS — E1140 Type 2 diabetes mellitus with diabetic neuropathy, unspecified: Secondary | ICD-10-CM | POA: Insufficient documentation

## 2020-06-02 DIAGNOSIS — F411 Generalized anxiety disorder: Secondary | ICD-10-CM | POA: Diagnosis not present

## 2020-06-02 DIAGNOSIS — F339 Major depressive disorder, recurrent, unspecified: Secondary | ICD-10-CM | POA: Insufficient documentation

## 2020-06-02 DIAGNOSIS — M5137 Other intervertebral disc degeneration, lumbosacral region: Secondary | ICD-10-CM

## 2020-06-03 ENCOUNTER — Encounter: Payer: Self-pay | Admitting: Psychology

## 2020-06-03 NOTE — Progress Notes (Signed)
Patient ID: Rhonda Thomas, female   DOB: March 19, 1956, 64 y.o.   MRN: [id number] [name]   DOB: [birth year]  MR Number: [id number]  Location: [facility] [place] PHYSICAL MEDICINE AND REHABILITATION [place], [place] [id number] [place] [address] Dept: [phone]  Start: 9 AM End: 10 AM  Today's visit is a 1 hour in person visit that was conducted in my outpatient clinic office with the patient myself present.  Provider/Observer:     Edgardo Roys PsyD  Chief Complaint:      Chief Complaint  Patient presents with  . Anxiety  . Pain  . Stress  . Other    Reason For Service:     I have worked with the patient for several years now. She was initially referred here for difficulty coping with the situation involving her children. Her youngest son is at severe difficulties over the years with regard to mood disorder, substance abuse and behavioral problems. Now there are major stressors associated with him again the patient has been essentially overwhelmed and unsure about what to do about. She reports that this has created a lot of stress both at home and at work. On top of that, her diabetes has gotten to the point that she had hammertoe amputated because of that. She has neuropathy as a result of diabetes as well and has had to stay out of her foot for 8 weeks.  The psychosocial stressors are having significant negative impact upon her severe diabetes with neuropathy and loss of toe.  The patient continues to have recurrent issues with residual effects of her type 2 diabetes with significant medical complications.  The reason for service has been reviewed and remains applicable for the current visit.  The patient reports that some of the stressors around her son have improved somewhat but there continue to be a lot of stressors around that that added to her anxiety and depressive symptomatology.   The patient reports that she is doing well managing her severe brittle diabetes.  Interventions Strategy:  Cognitive/behavioral therapeutic interventions and working on issues related to stress and her brittle diabetes/pain issues.  Participation Level:   Active  Participation Quality:  Appropriate      Behavioral Observation:  Well Groomed, Alert, and Appropriate.   Current Psychosocial Factors:   The patient reports that her son is now gotten married and has moved to [place].  She reports that this is dramatically reduce the stress at home and she feels like she is managing her blood glucose better with the reduced stress.  The patient reports that she is able to wear a regular shoe now and is hoping to keep any foot ulcers from forming with improved diabetic response.  Content of Session:   Reviewed current symptoms and continue to work on coping skills in dealing with both her medical issues as well as the stressors.  Current Status:   The patient reports that she has been able to finish her spinal cord stimulator surgery and reports that she is experiencing a 50-60% improvement in her pain levels.     Last Reviewed:   6/232/2021  Goals Addressed Today:    Goals addressed included building better coping skills.  Impression/Diagnosis:   The patient has a long history of attention deficit disorder but due to Maj. psychosocial stressors she also developed clinical depression and anxiety. I think that her attentional problems were valid prior to  the development of these issues that existed her entire life. However, she is in and repeat if and recurrent trauma with regard primarily to her son and to her daughter to a lesser degree in years past.     Edgardo Roys, PsyD 06/03/2020

## 2020-06-04 DIAGNOSIS — L97511 Non-pressure chronic ulcer of other part of right foot limited to breakdown of skin: Secondary | ICD-10-CM | POA: Diagnosis not present

## 2020-06-04 DIAGNOSIS — M86171 Other acute osteomyelitis, right ankle and foot: Secondary | ICD-10-CM | POA: Diagnosis not present

## 2020-06-18 DIAGNOSIS — L97511 Non-pressure chronic ulcer of other part of right foot limited to breakdown of skin: Secondary | ICD-10-CM | POA: Diagnosis not present

## 2020-07-01 ENCOUNTER — Ambulatory Visit: Payer: PPO | Admitting: Psychology

## 2020-07-02 IMAGING — DX DG WRIST COMPLETE 3+V*L*
3 series · 3 of 3 positions shown · non-contrast
Comparison: None.

CLINICAL DATA: Fell on the steps.  Wrist pain.

EXAM:
LEFT WRIST - COMPLETE 3+ VIEW

[wrist ap]
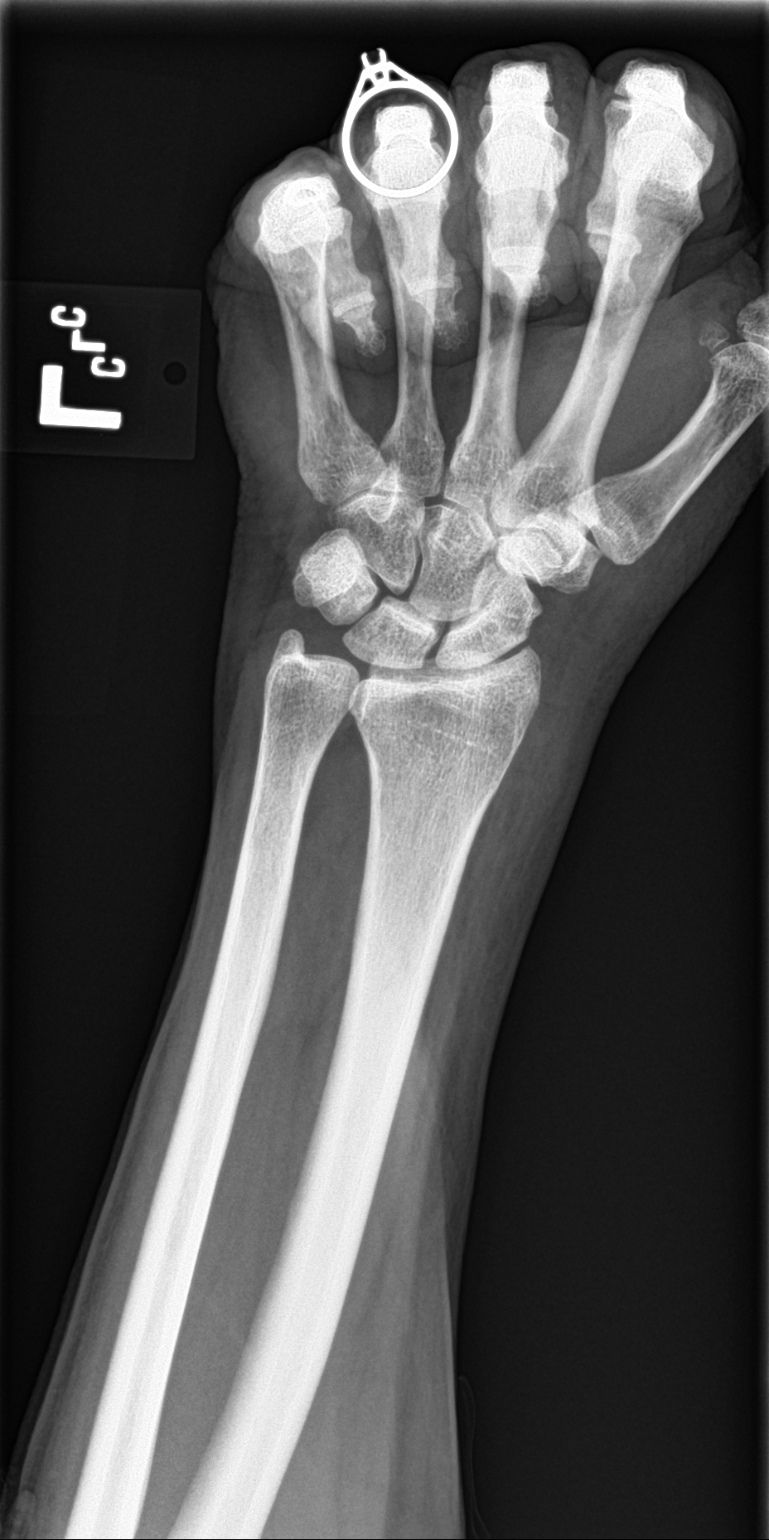

[wrist lat]
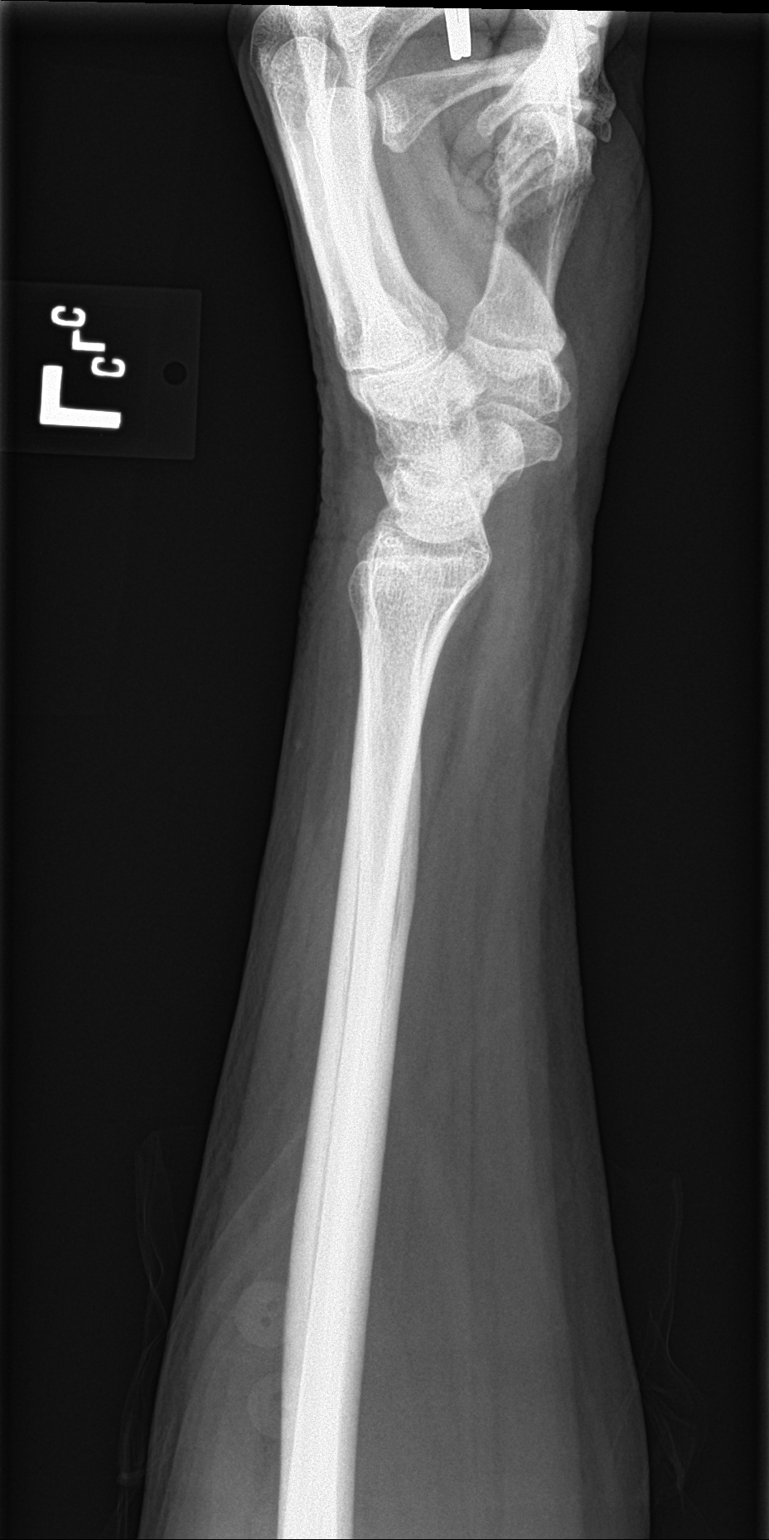

[wrist obl]
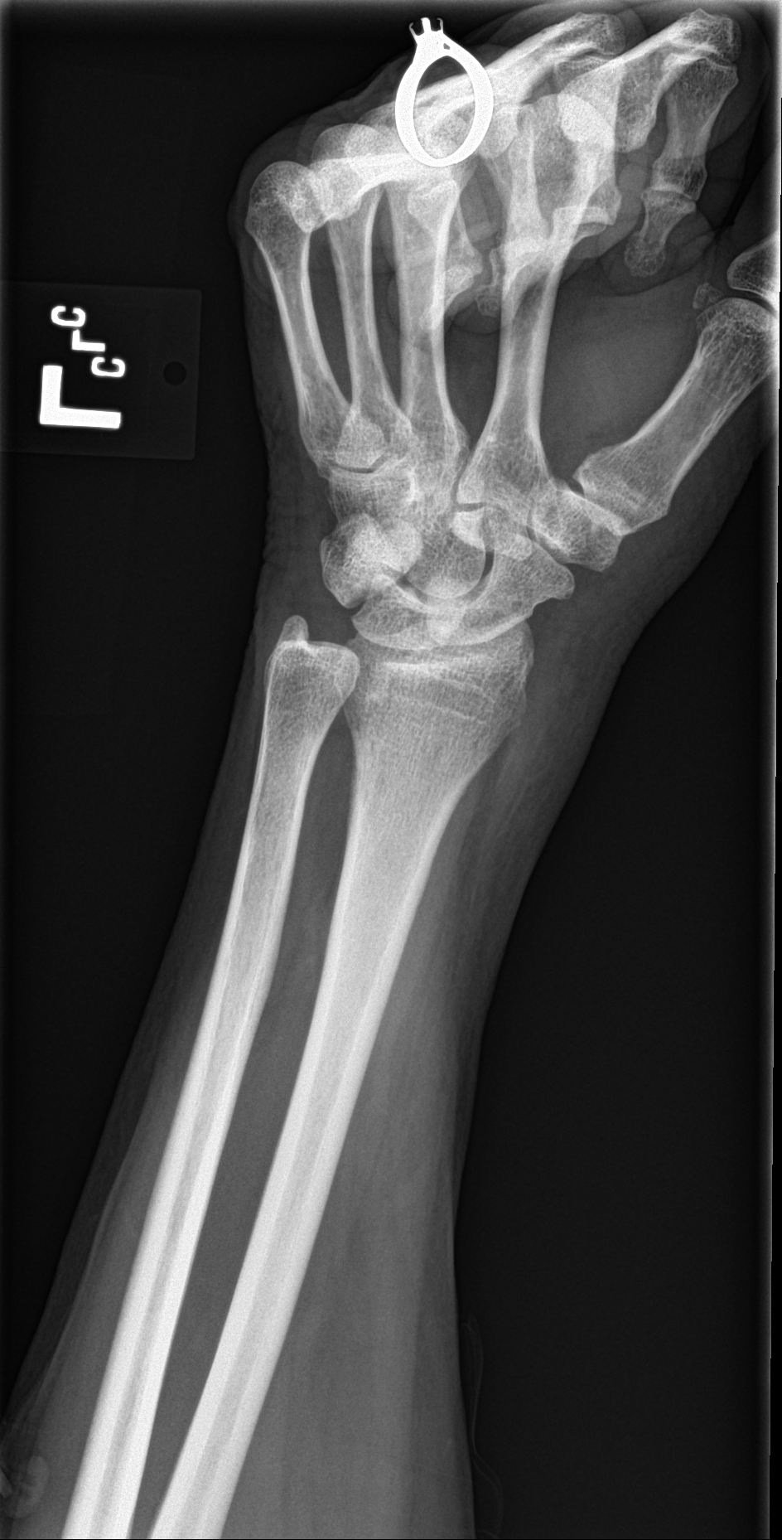

[3 of 3 positions shown; findings below may reference images not displayed]

FINDINGS: There is no evidence of fracture or dislocation. There is no
evidence of arthropathy or other focal bone abnormality. Soft
tissues are unremarkable.
IMPRESSION: Negative.

## 2020-07-06 ENCOUNTER — Other Ambulatory Visit: Payer: Self-pay | Admitting: *Deleted

## 2020-07-07 ENCOUNTER — Other Ambulatory Visit: Payer: Self-pay

## 2020-07-07 DIAGNOSIS — E1049 Type 1 diabetes mellitus with other diabetic neurological complication: Secondary | ICD-10-CM

## 2020-07-13 ENCOUNTER — Other Ambulatory Visit: Payer: Self-pay

## 2020-07-13 ENCOUNTER — Encounter: Payer: PPO | Attending: Psychology | Admitting: Psychology

## 2020-07-13 DIAGNOSIS — E1140 Type 2 diabetes mellitus with diabetic neuropathy, unspecified: Secondary | ICD-10-CM | POA: Diagnosis not present

## 2020-07-13 DIAGNOSIS — F331 Major depressive disorder, recurrent, moderate: Secondary | ICD-10-CM

## 2020-07-13 DIAGNOSIS — F339 Major depressive disorder, recurrent, unspecified: Secondary | ICD-10-CM | POA: Insufficient documentation

## 2020-07-13 DIAGNOSIS — F411 Generalized anxiety disorder: Secondary | ICD-10-CM | POA: Insufficient documentation

## 2020-07-13 DIAGNOSIS — M5137 Other intervertebral disc degeneration, lumbosacral region: Secondary | ICD-10-CM | POA: Diagnosis not present

## 2020-07-13 DIAGNOSIS — E1142 Type 2 diabetes mellitus with diabetic polyneuropathy: Secondary | ICD-10-CM | POA: Diagnosis not present

## 2020-07-15 ENCOUNTER — Other Ambulatory Visit: Payer: Self-pay | Admitting: *Deleted

## 2020-07-15 DIAGNOSIS — M79671 Pain in right foot: Secondary | ICD-10-CM | POA: Diagnosis not present

## 2020-07-15 DIAGNOSIS — G894 Chronic pain syndrome: Secondary | ICD-10-CM | POA: Diagnosis not present

## 2020-07-15 DIAGNOSIS — E1049 Type 1 diabetes mellitus with other diabetic neurological complication: Secondary | ICD-10-CM

## 2020-07-15 DIAGNOSIS — G2581 Restless legs syndrome: Secondary | ICD-10-CM | POA: Diagnosis not present

## 2020-07-15 DIAGNOSIS — Z79899 Other long term (current) drug therapy: Secondary | ICD-10-CM | POA: Diagnosis not present

## 2020-07-15 DIAGNOSIS — Z5181 Encounter for therapeutic drug level monitoring: Secondary | ICD-10-CM | POA: Diagnosis not present

## 2020-07-15 DIAGNOSIS — E1140 Type 2 diabetes mellitus with diabetic neuropathy, unspecified: Secondary | ICD-10-CM | POA: Diagnosis not present

## 2020-07-16 DIAGNOSIS — L97511 Non-pressure chronic ulcer of other part of right foot limited to breakdown of skin: Secondary | ICD-10-CM | POA: Diagnosis not present

## 2020-07-16 DIAGNOSIS — L97521 Non-pressure chronic ulcer of other part of left foot limited to breakdown of skin: Secondary | ICD-10-CM | POA: Diagnosis not present

## 2020-07-23 ENCOUNTER — Encounter: Payer: Self-pay | Admitting: Psychology

## 2020-07-23 NOTE — Progress Notes (Signed)
Patient ID: Rhonda Thomas, female   DOB: [birth year], 64 y.o.   MRN: [id number] [name]   DOB: 1956-10-04  MR Number: [id number]  Location: [facility] [place] [facility] [place], [place] [id number] [place] [address] Dept: [phone]  Start: 4 PM End: 5 PM  Today's visit is a 1 hour in person visit that was conducted in my outpatient clinic office with the patient myself present.  Provider/Observer:     Edgardo Roys PsyD  Chief Complaint:      Chief Complaint  Patient presents with  . Anxiety  . Pain  . Stress    Reason For Service:     I have worked with the patient for several years now. She was initially referred here for difficulty coping with the situation involving her children. Her youngest son is at severe difficulties over the years with regard to mood disorder, substance abuse and behavioral problems. Now there are major stressors associated with him again the patient has been essentially overwhelmed and unsure about what to do about. She reports that this has created a lot of stress both at home and at work. On top of that, her diabetes has gotten to the point that she had hammertoe amputated because of that. She has neuropathy as a result of diabetes as well and has had to stay out of her foot for 8 weeks.  The psychosocial stressors are having significant negative impact upon her severe diabetes with neuropathy and loss of toe.  The patient continues to have recurrent issues with residual effects of her type 2 diabetes with significant medical complications.  The reason for service has been reviewed and remains applicable for the current visit.  The patient reports that some of the stressors around her son have improved somewhat but there continue to be a lot of stressors around that that added to her anxiety and depressive symptomatology.  The  patient reports that she is doing well managing her severe brittle diabetes.  Interventions Strategy:  Cognitive/behavioral therapeutic interventions and working on issues related to stress and her brittle diabetes/pain issues.  Participation Level:   Active  Participation Quality:  Appropriate      Behavioral Observation:  Well Groomed, Alert, and Appropriate.   Current Psychosocial Factors:   The patient reports that her son is now gotten married and has moved to [place].  She reports that this is dramatically reduce the stress at home and she feels like she is managing her blood glucose better with the reduced stress.  The patient reports that she is able to wear a regular shoe now and is hoping to keep any foot ulcers from forming with improved diabetic response.  Content of Session:   Reviewed current symptoms and continue to work on coping skills in dealing with both her medical issues as well as the stressors.  Current Status:   The patient reports that she has been able to finish her spinal cord stimulator surgery and reports that she is experiencing a 50-60% improvement in her pain levels.     Last Reviewed:   07/13/2020  Goals Addressed Today:    Goals addressed included building better coping skills.  Impression/Diagnosis:   The patient has a long history of attention deficit disorder but due to Maj. psychosocial stressors she also developed clinical depression and anxiety. I think that her attentional problems were valid prior to the development of  these issues that existed her entire life. However, she is in and repeat if and recurrent trauma with regard primarily to her son and to her daughter to a lesser degree in years past.     Edgardo Roys, PsyD 07/23/2020

## 2020-07-30 ENCOUNTER — Encounter: Payer: PPO | Admitting: Psychology

## 2020-08-28 ENCOUNTER — Telehealth: Payer: Self-pay | Admitting: Physician Assistant

## 2020-08-30 NOTE — Telephone Encounter (Signed)
I am fine with taking her on, I do not know how soon we will be able to get her scheduled but I can see her.

## 2020-08-31 NOTE — Telephone Encounter (Signed)
Appointment scheduled.

## 2020-09-01 ENCOUNTER — Other Ambulatory Visit: Payer: Self-pay | Admitting: *Deleted

## 2020-09-01 DIAGNOSIS — E1049 Type 1 diabetes mellitus with other diabetic neurological complication: Secondary | ICD-10-CM

## 2020-09-01 MED ORDER — TRULICITY 1.5 MG/0.5ML ~~LOC~~ SOAJ: SUBCUTANEOUS | 0 refills | Status: DC

## 2020-09-03 ENCOUNTER — Other Ambulatory Visit: Payer: Self-pay | Admitting: *Deleted

## 2020-09-03 DIAGNOSIS — E1142 Type 2 diabetes mellitus with diabetic polyneuropathy: Secondary | ICD-10-CM

## 2020-09-03 DIAGNOSIS — G2581 Restless legs syndrome: Secondary | ICD-10-CM

## 2020-09-03 MED ORDER — CARBIDOPA-LEVODOPA 25-100 MG PO TABS: 1.0000 | ORAL_TABLET | Freq: Three times a day (TID) | ORAL | 0 refills | Status: DC

## 2020-09-03 MED ORDER — OXCARBAZEPINE 150 MG PO TABS: ORAL_TABLET | ORAL | 0 refills | Status: DC

## 2020-09-12 ENCOUNTER — Other Ambulatory Visit: Payer: Self-pay | Admitting: Family Medicine

## 2020-09-14 ENCOUNTER — Other Ambulatory Visit: Payer: Self-pay | Admitting: Family Medicine

## 2020-09-14 ENCOUNTER — Encounter: Payer: Self-pay | Admitting: Family Medicine

## 2020-09-14 ENCOUNTER — Ambulatory Visit (INDEPENDENT_AMBULATORY_CARE_PROVIDER_SITE_OTHER): Payer: PPO | Admitting: Family Medicine

## 2020-09-14 VITALS — BP 96/61 | HR 76 | Temp 97.4°F | Ht 69.0 in | Wt 198.0 lb

## 2020-09-14 DIAGNOSIS — G2581 Restless legs syndrome: Secondary | ICD-10-CM

## 2020-09-14 DIAGNOSIS — L97521 Non-pressure chronic ulcer of other part of left foot limited to breakdown of skin: Secondary | ICD-10-CM | POA: Diagnosis not present

## 2020-09-14 DIAGNOSIS — E1142 Type 2 diabetes mellitus with diabetic polyneuropathy: Secondary | ICD-10-CM | POA: Diagnosis not present

## 2020-09-14 DIAGNOSIS — E1049 Type 1 diabetes mellitus with other diabetic neurological complication: Secondary | ICD-10-CM

## 2020-09-14 DIAGNOSIS — E785 Hyperlipidemia, unspecified: Secondary | ICD-10-CM | POA: Diagnosis not present

## 2020-09-14 DIAGNOSIS — Z23 Encounter for immunization: Secondary | ICD-10-CM

## 2020-09-14 DIAGNOSIS — E1169 Type 2 diabetes mellitus with other specified complication: Secondary | ICD-10-CM | POA: Diagnosis not present

## 2020-09-14 DIAGNOSIS — E538 Deficiency of other specified B group vitamins: Secondary | ICD-10-CM | POA: Diagnosis not present

## 2020-09-14 DIAGNOSIS — Z794 Long term (current) use of insulin: Secondary | ICD-10-CM

## 2020-09-14 LAB — BAYER DCA HB A1C WAIVED: HB A1C (BAYER DCA - WAIVED): 7.1 % — ABNORMAL HIGH (ref <7.0)

## 2020-09-14 MED ORDER — OXCARBAZEPINE 150 MG PO TABS: ORAL_TABLET | ORAL | 0 refills | Status: DC

## 2020-09-14 MED ORDER — ROSUVASTATIN CALCIUM 5 MG PO TABS: 5.0000 mg | ORAL_TABLET | Freq: Every day | ORAL | 3 refills | Status: DC

## 2020-09-14 MED ORDER — PREGABALIN 150 MG PO CAPS: 150.0000 mg | ORAL_CAPSULE | Freq: Two times a day (BID) | ORAL | 5 refills | Status: DC

## 2020-09-14 MED ORDER — TRULICITY 1.5 MG/0.5ML ~~LOC~~ SOAJ: 1.5000 mg | SUBCUTANEOUS | 3 refills | Status: DC

## 2020-09-14 MED ORDER — METFORMIN HCL 500 MG PO TABS: 500.0000 mg | ORAL_TABLET | Freq: Two times a day (BID) | ORAL | 3 refills | Status: DC

## 2020-09-14 MED ORDER — BASAGLAR KWIKPEN 100 UNIT/ML ~~LOC~~ SOPN
20.0000 [IU] | PEN_INJECTOR | Freq: Every day | SUBCUTANEOUS | 3 refills | Status: DC
Start: 2020-09-14 — End: 2021-04-21

## 2020-09-14 MED ORDER — NOVOLOG FLEXPEN 100 UNIT/ML ~~LOC~~ SOPN: 3.0000 [IU] | PEN_INJECTOR | Freq: Two times a day (BID) | SUBCUTANEOUS | 11 refills | Status: DC | PRN

## 2020-09-14 MED ORDER — DULOXETINE HCL 60 MG PO CPEP: 60.0000 mg | ORAL_CAPSULE | Freq: Every day | ORAL | 3 refills | Status: DC

## 2020-09-14 NOTE — Progress Notes (Signed)
BP 96/61   Pulse 76   Temp (!) 97.4 F (36.3 C)   Ht 5' 9"  (1.753 m)   Wt 198 lb (89.8 kg)   SpO2 96%   BMI 29.24 kg/m    Subjective:   Patient ID: Rhonda Thomas, female    DOB: 15-Jan-1956, 64 y.o.   MRN: [id number]  HPI: Rhonda Thomas is a 64 y.o. female presenting on 09/14/2020 for Medical Management of Chronic Issues and Diabetes   HPI Hyperlipidemia Patient is coming in for recheck of his hyperlipidemia. The patient is currently taking Crestor. They deny any issues with myalgias or history of liver damage from it. They deny any focal numbness or weakness or chest pain.   Type 2 diabetes mellitus Patient comes in today for recheck of his diabetes. Patient has been currently taking NovoLog and Basaglar and Trulicity and Metformin. Patient is not currently on an ACE inhibitor/ARB. Patient has seen an ophthalmologist this year. Patient denies any new issues with their feet. The symptom started onset as an adult diabetic neuropathy and hyperlipidemia and retinopathy ARE RELATED TO DM   Patient is coming in for vitamin B12 deficiency recheck.  Patient is on a significant amount of medications for back pain and neuropathic pain.  These include possibly Sinemet and Cymbalta and Trileptal and Lyrica, we work on tapering her off of the Trileptal at first.  Patient does not know why she is on carbidopa levodopa, will refer to neurology  Relevant past medical, surgical, family and social history reviewed and updated as indicated. Interim medical history since our last visit reviewed. Allergies and medications reviewed and updated.  Review of Systems  Constitutional: Negative for chills and fever.  Eyes: Negative for visual disturbance.  Respiratory: Negative for chest tightness and shortness of breath.   Cardiovascular: Negative for chest pain and leg swelling.  Genitourinary: Negative for difficulty urinating and dysuria.  Musculoskeletal: Positive for myalgias. Negative  for back pain and gait problem.  Skin: Negative for rash.  Neurological: Positive for numbness. Negative for weakness, light-headedness and headaches.  Psychiatric/Behavioral: Negative for agitation and behavioral problems.  All other systems reviewed and are negative.   Per HPI unless specifically indicated above   Allergies as of 09/14/2020      Reactions   Actos [pioglitazone]    Edema   Invokana [canagliflozin]    vaginitis      Medication List       Accurate as of September 14, 2020  4:59 PM. If you have any questions, ask your nurse or doctor.        STOP taking these medications   fluconazole 150 MG tablet Commonly known as: Diflucan Stopped by: Fransisca Kaufmann Darien Kading, MD     TAKE these medications   Aspirin Low Dose 81 MG EC tablet Generic drug: aspirin TAKE 1 TABLET BY MOUTH EVERY DAY   Basaglar KwikPen 100 UNIT/ML Inject 20-30 Units into the skin at bedtime. What changed: See the new instructions. Changed by: Fransisca Kaufmann Camela Wich, MD   carbidopa-levodopa 25-100 MG tablet Commonly known as: SINEMET IR Take 1 tablet by mouth 3 (three) times daily.   DULoxetine 60 MG capsule Commonly known as: CYMBALTA Take 1 capsule (60 mg total) by mouth at bedtime. What changed: when to take this Changed by: Fransisca Kaufmann Leiam Hopwood, MD   glucose monitoring kit monitoring kit Check glucose 4 times per day for insulin dependent diabetes with complications. Fill test strips #100 and Lancets #100 with PRN refills.  Insulin Pen Needle 31G X 5 MM Misc Commonly known as: Global Ease Inject Pen Needles USE FOUR TIMES DAILY   metFORMIN 500 MG tablet Commonly known as: GLUCOPHAGE Take 1 tablet (500 mg total) by mouth 2 (two) times daily with a meal. What changed:   how much to take  when to take this Changed by: Worthy Rancher, MD   NovoLOG FlexPen 100 UNIT/ML FlexPen Generic drug: insulin aspart Inject 3-20 Units into the skin 3 times/day as needed-between meals & bedtime  for high blood sugar.   Nucynta ER 150 MG Tb12 Generic drug: Tapentadol HCl Take 1 tablet by mouth every 12 (twelve) hours.   OXcarbazepine 150 MG tablet Commonly known as: TRILEPTAL TAKE ONE TABLET BY MOUTH EVERY MORNING AND TAKE TWO TABLETS BY MOUTH AT BEDTIME   pregabalin 150 MG capsule Commonly known as: LYRICA Take 1 capsule (150 mg total) by mouth 2 (two) times daily. TAKE ONE CAPSULE BY MOUTH THREE TIMES DAILY What changed:   how much to take  how to take this  when to take this Changed by: Worthy Rancher, MD   rosuvastatin 5 MG tablet Commonly known as: CRESTOR Take 1 tablet (5 mg total) by mouth at bedtime.   Trulicity 1.5 QJ/3.3LK Sopn Generic drug: Dulaglutide Inject 1.5 mg into the skin once a week. inject 1.5 MG into SKIN ONCE EVERY WEEK What changed:   how much to take  how to take this  when to take this Changed by: Fransisca Kaufmann Kiwan Gadsden, MD        Objective:   BP 96/61   Pulse 76   Temp (!) 97.4 F (36.3 C)   Ht 5' 9"  (1.753 m)   Wt 198 lb (89.8 kg)   SpO2 96%   BMI 29.24 kg/m   Wt Readings from Last 3 Encounters:  09/14/20 198 lb (89.8 kg)  06/26/19 200 lb (90.7 kg)  02/28/19 200 lb 12.8 oz (91.1 kg)    Physical Exam Vitals and nursing note reviewed.  Constitutional:      General: She is not in acute distress.    Appearance: She is well-developed. She is not diaphoretic.  Eyes:     Conjunctiva/sclera: Conjunctivae normal.  Cardiovascular:     Rate and Rhythm: Normal rate and regular rhythm.     Heart sounds: Normal heart sounds. No murmur heard.   Pulmonary:     Effort: Pulmonary effort is normal. No respiratory distress.     Breath sounds: Normal breath sounds. No wheezing.  Musculoskeletal:        General: No tenderness. Normal range of motion.  Skin:    General: Skin is warm and dry.     Findings: No rash.  Neurological:     Mental Status: She is alert and oriented to person, place, and time.     Coordination:  Coordination normal.  Psychiatric:        Behavior: Behavior normal.     Results for orders placed or performed in visit on 02/06/20  HM DIABETES EYE EXAM  Result Value Ref Range   HM Diabetic Eye Exam Retinopathy (A) No Retinopathy    Assessment & Plan:   Problem List Items Addressed This Visit      Endocrine   Type 2 diabetes mellitus with other specified complication ([place])   Relevant Medications   rosuvastatin (CRESTOR) 5 MG tablet   pregabalin (LYRICA) 150 MG capsule   metFORMIN (GLUCOPHAGE) 500 MG tablet   Insulin Glargine (BASAGLAR  KWIKPEN) 100 UNIT/ML   insulin aspart (NOVOLOG FLEXPEN) 100 UNIT/ML FlexPen   Dulaglutide (TRULICITY) 1.5 PP/9.4FE SOPN   Other Relevant Orders   CBC with Differential/Platelet (Completed)   CMP14+EGFR (Completed)   Bayer DCA Hb A1c Waived (Completed)   Ambulatory referral to Neurology   Diabetic peripheral neuropathy (HCC) - Primary   Relevant Medications   rosuvastatin (CRESTOR) 5 MG tablet   OXcarbazepine (TRILEPTAL) 150 MG tablet   pregabalin (LYRICA) 150 MG capsule   metFORMIN (GLUCOPHAGE) 500 MG tablet   Insulin Glargine (BASAGLAR KWIKPEN) 100 UNIT/ML   insulin aspart (NOVOLOG FLEXPEN) 100 UNIT/ML FlexPen   DULoxetine (CYMBALTA) 60 MG capsule   Dulaglutide (TRULICITY) 1.5 XM/1.4JW SOPN     Musculoskeletal and Integument   Skin ulcer of left foot, limited to breakdown of skin (HCC)   Relevant Medications   DULoxetine (CYMBALTA) 60 MG capsule     Other   Restless legs syndrome ([facility]S)   Hyperlipidemia   Relevant Medications   rosuvastatin (CRESTOR) 5 MG tablet   Other Relevant Orders   Lipid panel (Completed)   Vitamin B12 deficiency   Relevant Orders   Vitamin B12    Other Visit Diagnoses    Flu vaccine need       Relevant Orders   Flu Vaccine QUAD 6+ mos PF IM (Fluarix Quad PF) (Completed)   Type 1 diabetes mellitus with other neurologic complication (HCC)       Relevant Medications   rosuvastatin (CRESTOR) 5 MG  tablet   pregabalin (LYRICA) 150 MG capsule   metFORMIN (GLUCOPHAGE) 500 MG tablet   Insulin Glargine (BASAGLAR KWIKPEN) 100 UNIT/ML   insulin aspart (NOVOLOG FLEXPEN) 100 UNIT/ML FlexPen   Dulaglutide (TRULICITY) 1.5 LK/9.5FM SOPN      We are going to taper her off of Trileptal, keep on Lyrica and Cymbalta, will refer her to neurology to discuss the carbidopa levodopa. Follow up plan: Return in about 4 weeks (around 10/12/2020), or if symptoms worsen or fail to improve, for neuropathy.  Counseling provided for all of the vaccine components Orders Placed This Encounter  Procedures  . Flu Vaccine QUAD 6+ mos PF IM (Fluarix Quad PF)  . CBC with Differential/Platelet  . CMP14+EGFR  . Lipid panel  . Bayer DCA Hb A1c Waived  . Vitamin B12  . Ambulatory referral to Neurology    Caryl Pina, MD [facility] 09/14/2020, 4:59 PM

## 2020-09-15 DIAGNOSIS — M79671 Pain in right foot: Secondary | ICD-10-CM | POA: Diagnosis not present

## 2020-09-15 DIAGNOSIS — M79672 Pain in left foot: Secondary | ICD-10-CM | POA: Diagnosis not present

## 2020-09-15 DIAGNOSIS — E1140 Type 2 diabetes mellitus with diabetic neuropathy, unspecified: Secondary | ICD-10-CM | POA: Diagnosis not present

## 2020-09-15 DIAGNOSIS — G894 Chronic pain syndrome: Secondary | ICD-10-CM | POA: Diagnosis not present

## 2020-09-15 LAB — CBC WITH DIFFERENTIAL/PLATELET
Basophils Absolute: 0 10*3/uL (ref 0.0–0.2)
Basos: 0 %
EOS (ABSOLUTE): 0 10*3/uL (ref 0.0–0.4)
Eos: 1 %
Hematocrit: 36.4 % (ref 34.0–46.6)
Hemoglobin: 11.9 g/dL (ref 11.1–15.9)
Immature Grans (Abs): 0.1 10*3/uL (ref 0.0–0.1)
Immature Granulocytes: 1 %
Lymphocytes Absolute: 2.2 10*3/uL (ref 0.7–3.1)
Lymphs: 29 %
MCH: 29.4 pg (ref 26.6–33.0)
MCHC: 32.7 g/dL (ref 31.5–35.7)
MCV: 90 fL (ref 79–97)
Monocytes Absolute: 0.5 10*3/uL (ref 0.1–0.9)
Monocytes: 7 %
Neutrophils Absolute: 4.6 10*3/uL (ref 1.4–7.0)
Neutrophils: 62 %
Platelets: 236 10*3/uL (ref 150–450)
RBC: 4.05 x10E6/uL (ref 3.77–5.28)
RDW: 12.4 % (ref 11.7–15.4)
WBC: 7.4 10*3/uL (ref 3.4–10.8)

## 2020-09-15 LAB — CMP14+EGFR
ALT: 23 IU/L (ref 0–32)
AST: 11 IU/L (ref 0–40)
Albumin/Globulin Ratio: 1.5 (ref 1.2–2.2)
Albumin: 3.5 g/dL — ABNORMAL LOW (ref 3.8–4.8)
Alkaline Phosphatase: 98 IU/L (ref 44–121)
BUN/Creatinine Ratio: 17 (ref 12–28)
BUN: 14 mg/dL (ref 8–27)
Bilirubin Total: <0.2 mg/dL (ref 0.0–1.2)
CO2: 29 mmol/L (ref 20–29)
Calcium: 8.8 mg/dL (ref 8.7–10.3)
Chloride: 103 mmol/L (ref 96–106)
Creatinine, Ser: 0.83 mg/dL (ref 0.57–1.00)
GFR calc Af Amer: 86 mL/min/{1.73_m2} (ref >59)
GFR calc non Af Amer: 75 mL/min/{1.73_m2} (ref >59)
Globulin, Total: 2.3 g/dL (ref 1.5–4.5)
Glucose: 167 mg/dL — ABNORMAL HIGH (ref 65–99)
Potassium: 4.6 mmol/L (ref 3.5–5.2)
Sodium: 141 mmol/L (ref 134–144)
Total Protein: 5.8 g/dL — ABNORMAL LOW (ref 6.0–8.5)

## 2020-09-15 LAB — LIPID PANEL
Chol/HDL Ratio: 3.1 ratio (ref 0.0–4.4)
Cholesterol, Total: 121 mg/dL (ref 100–199)
HDL: 39 mg/dL — ABNORMAL LOW (ref >39)
LDL Chol Calc (NIH): 41 mg/dL (ref 0–99)
Triglycerides: 266 mg/dL — ABNORMAL HIGH (ref 0–149)
VLDL Cholesterol Cal: 41 mg/dL — ABNORMAL HIGH (ref 5–40)

## 2020-09-16 LAB — VITAMIN B12: Vitamin B-12: 408 pg/mL (ref 232–1245)

## 2020-09-16 LAB — SPECIMEN STATUS REPORT

## 2020-10-01 ENCOUNTER — Encounter: Payer: Self-pay | Admitting: Family Medicine

## 2020-10-04 ENCOUNTER — Other Ambulatory Visit: Payer: Self-pay | Admitting: Family Medicine

## 2020-10-04 DIAGNOSIS — G2581 Restless legs syndrome: Secondary | ICD-10-CM

## 2020-10-07 DIAGNOSIS — D224 Melanocytic nevi of scalp and neck: Secondary | ICD-10-CM | POA: Diagnosis not present

## 2020-10-07 DIAGNOSIS — D485 Neoplasm of uncertain behavior of skin: Secondary | ICD-10-CM | POA: Diagnosis not present

## 2020-10-07 DIAGNOSIS — L570 Actinic keratosis: Secondary | ICD-10-CM | POA: Diagnosis not present

## 2020-10-13 ENCOUNTER — Other Ambulatory Visit: Payer: Self-pay

## 2020-10-13 ENCOUNTER — Encounter: Payer: Self-pay | Admitting: Neurology

## 2020-10-13 ENCOUNTER — Ambulatory Visit (INDEPENDENT_AMBULATORY_CARE_PROVIDER_SITE_OTHER): Payer: PPO | Admitting: Neurology

## 2020-10-13 VITALS — BP 134/86 | HR 75 | Ht 69.0 in | Wt 196.5 lb

## 2020-10-13 DIAGNOSIS — G2581 Restless legs syndrome: Secondary | ICD-10-CM | POA: Insufficient documentation

## 2020-10-13 DIAGNOSIS — L97521 Non-pressure chronic ulcer of other part of left foot limited to breakdown of skin: Secondary | ICD-10-CM

## 2020-10-13 DIAGNOSIS — E1142 Type 2 diabetes mellitus with diabetic polyneuropathy: Secondary | ICD-10-CM | POA: Diagnosis not present

## 2020-10-13 DIAGNOSIS — E1049 Type 1 diabetes mellitus with other diabetic neurological complication: Secondary | ICD-10-CM | POA: Diagnosis not present

## 2020-10-13 MED ORDER — OXCARBAZEPINE 150 MG PO TABS: ORAL_TABLET | ORAL | 4 refills | Status: DC

## 2020-10-13 MED ORDER — ROPINIROLE HCL 1 MG PO TABS: 2.0000 mg | ORAL_TABLET | Freq: Every day | ORAL | 6 refills | Status: DC

## 2020-10-13 MED ORDER — PREGABALIN 150 MG PO CAPS: ORAL_CAPSULE | ORAL | 4 refills | Status: DC

## 2020-10-13 NOTE — Progress Notes (Signed)
Chief Complaint  Patient presents with  . New Patient (Initial Visit)    Reports worsening burning pain and spasms in her bilateral feet. She has pain in her hands but was diagnosed with CTS. She put off surgery due to COVID-19.  Lastly, she has a spinal cord stimulator in place.  For pain, she is taking Lyrica, Trileptal, Cymbalta and Nucynta ER. She is under the care of pain management. She is also taking Sinemet.  Marland Kitchen PCP    Dettinger, Fransisca Kaufmann, MD  . Pain Management    Lucia Bitter., MD    HISTORICAL  Rhonda Thomas is a 64 year old female, seen in request by her primary care physician Dr. Warrick Parisian, Vonna Kotyk and pain management Dr. Debarah Crape for evaluation of worsening bilateral lower extremity burning pain, spasm, evaluation was on October 13, 2020  I reviewed and summarized the referring note.  Past medical history Diabetes, insulin-dependent Depression ADHD Hyperlipidemia Restless leg syndrome.  I saw her previously in 2013 for similar complaints, she reported long history of diabetes, often under suboptimal control, developed diabetic peripheral neuropathy, starting to complains of bilateral feet paresthesia involving plantar surface and toes at early 2000, progressively getting worse,  Electrodiagnostic study in 2013 showed evidence of mild to moderate length dependent axonal sensory motor polyneuropathy There was also evidence of mild to moderate bilateral carpal tunnel syndromes  She also had right second toe amputation due to infection, frequent recurrent toe infection  She developed restless leg symptoms, bilateral lower extremity neuropathic pain, was treated with Lyrica, also sentiment, reported some improvement, she lost follow-up since last visit in September 2013  Over the years, her bilateral lower extremity neuropathy continue to getting worse, no numbness is still below knee level, intermittent bilateral hands paresthesia, she went on medical  disability few years ago, complains of irregular sleep pattern, she tends to go to bed around 1-2 AM, sleeps in late,  She is on polypharmacy treatment, this including Trileptal 150 mg twice a day, Lyrica 150 mg twice a day, Sinemet 25/100 mg twice a day, she has hard time with her neck, she also complains of burning pain, muscle cramping at her food, especially along left lateral foot, extending into her legs.  She complains of frequent leg jerking, difficulty falling to sleep, she took her nighttime medication at 6 PM, did not go to sleep until 1 AM 6 to 7 hours later, complains of difficulty sleeping, moving her neck so much, that took a hole in her bed sheets, poor sleep quality, frequent awakening,  Laboratory evaluation in October 2021: Normal B12 408, lipid panel LDL 41, triglyceride 266, CMP Albumin 3.5, and normal CBC hemoglobin of 11.9, A1C 7.1   REVIEW OF SYSTEMS: Full 14 system review of systems performed and notable only for as above All other review of systems were negative.  ALLERGIES: Allergies  Allergen Reactions  . Actos [Pioglitazone]     Edema   . Invokana [Canagliflozin]     vaginitis    HOME MEDICATIONS: Current Outpatient Medications  Medication Sig Dispense Refill  . ASPIRIN LOW DOSE 81 MG EC tablet TAKE 1 TABLET BY MOUTH EVERY DAY 90 tablet 0  . carbidopa-levodopa (SINEMET IR) 25-100 MG tablet TAKE 1 TABLET BY MOUTH THREE TIMES DAILY 90 tablet 0  . Dulaglutide (TRULICITY) 1.5 LZ/7.6BH SOPN Inject 1.5 mg into the skin once a week. inject 1.5 MG into SKIN ONCE EVERY WEEK 3 mL 3  . DULoxetine (CYMBALTA) 60 MG capsule Take 1  capsule (60 mg total) by mouth at bedtime. 90 capsule 3  . glucose monitoring kit (FREESTYLE) monitoring kit Check glucose 4 times per day for insulin dependent diabetes with complications. Fill test strips #100 and Lancets #100 with PRN refills. 1 each 11  . insulin aspart (NOVOLOG FLEXPEN) 100 UNIT/ML FlexPen Inject 3-20 Units into the skin 3  times/day as needed-between meals & bedtime for high blood sugar. 30 mL 11  . Insulin Glargine (BASAGLAR KWIKPEN) 100 UNIT/ML Inject 20-30 Units into the skin at bedtime. 15 mL 3  . Insulin Pen Needle (GLOBAL EASE INJECT PEN NEEDLES) 31G X 5 MM MISC USE FOUR TIMES DAILY 100 each 9  . metFORMIN (GLUCOPHAGE) 500 MG tablet Take 1 tablet (500 mg total) by mouth 2 (two) times daily with a meal. 180 tablet 3  . NUCYNTA ER 150 MG TB12 Take 1 tablet by mouth every 12 (twelve) hours.    . OXcarbazepine (TRILEPTAL) 150 MG tablet TAKE ONE TABLET BY MOUTH EVERY MORNING AND TAKE TWO TABLETS BY MOUTH AT BEDTIME 30 tablet 0  . pregabalin (LYRICA) 150 MG capsule Take 1 capsule (150 mg total) by mouth 2 (two) times daily. 60 capsule 5  . rosuvastatin (CRESTOR) 5 MG tablet Take 1 tablet (5 mg total) by mouth at bedtime. 90 tablet 3   No current facility-administered medications for this visit.    PAST MEDICAL HISTORY: Past Medical History:  Diagnosis Date  . Anxiety   . Attention deficit disorder (ADD)   . Depression   . Diabetes ([place])   . Hiatal hernia   . Movement disorder   . Neuropathy   . Schatzki's ring   . Sleep apnea     PAST SURGICAL HISTORY: Past Surgical History:  Procedure Laterality Date  . ABDOMINAL HYSTERECTOMY    . BREAST LUMPECTOMY WITH RADIOACTIVE SEED LOCALIZATION Right 04/25/2016   Procedure: RIGHT BREAST LUMPECTOMY WITH RADIOACTIVE SEED LOCALIZATION;  Surgeon: Autumn Messing III, MD;  Location: [facility];  Service: General;  Laterality: Right;  . CHOLECYSTECTOMY    . RECTOCELE REPAIR    . SPINAL CORD STIMULATOR IMPLANT    . TOE AMPUTATION Right    2nd toe rt foot  . TUBAL LIGATION      FAMILY HISTORY: Family History  Problem Relation Age of Onset  . Stroke Mother   . Diabetes Father   . Heart disease Father        pacemaker  . Diabetes Other   . Heart Problems Other   . Colon cancer Neg Hx     SOCIAL HISTORY: Social History   Socioeconomic History    . Marital status: Married    Spouse name: Rhonda Thomas  . Number of children: 3  . Years of education: college  . Highest education level: Not on file  Occupational History  . Occupation: Retired    Fish farm manager: [employer]    Comment: [place]  Tobacco Use  . Smoking status: Never Smoker  . Smokeless tobacco: Never Used  Vaping Use  . Vaping Use: Never used  Substance and Sexual Activity  . Alcohol use: No    Alcohol/week: 0.0 standard drinks  . Drug use: Never  . Sexual activity: Not on file  Other Topics Concern  . Not on file  Social History Narrative   Patient lives at home with her husband. Rhonda Thomas).   Education- College   Right handed.   Caffeine- tea one cup daily.      Social  Determinants of Health   Financial Resource Strain:   . Difficulty of Paying Living Expenses: Not on file  Food Insecurity:   . Worried About Charity fundraiser in the Last Year: Not on file  . Ran Out of Food in the Last Year: Not on file  Transportation Needs:   . Lack of Transportation (Medical): Not on file  . Lack of Transportation (Non-Medical): Not on file  Physical Activity:   . Days of Exercise per Week: Not on file  . Minutes of Exercise per Session: Not on file  Stress:   . Feeling of Stress : Not on file  Social Connections:   . Frequency of Communication with Friends and Family: Not on file  . Frequency of Social Gatherings with Friends and Family: Not on file  . Attends Religious Services: Not on file  . Active Member of Clubs or Organizations: Not on file  . Attends Archivist Meetings: Not on file  . Marital Status: Not on file  Intimate Partner Violence:   . Fear of Current or Ex-Partner: Not on file  . Emotionally Abused: Not on file  . Physically Abused: Not on file  . Sexually Abused: Not on file     PHYSICAL EXAM   Vitals:   10/13/20 0754  BP: 134/86  Pulse: 75  Weight: 196 lb 8 oz (89.1 kg)  Height: 5' 9"  (1.753 m)    Not recorded     Body mass index is 29.02 kg/m.  PHYSICAL EXAMNIATION:  Gen: NAD, conversant, well nourised, well groomed                     Cardiovascular: Regular rate rhythm, no peripheral edema, warm, nontender. Eyes: Conjunctivae clear without exudates or hemorrhage Neck: Supple, no carotid bruits. Pulmonary: Clear to auscultation bilaterally   NEUROLOGICAL EXAM:  MENTAL STATUS: Speech:    Speech is normal; fluent and spontaneous with normal comprehension.  Cognition:     Orientation to time, place and person     Normal recent and remote memory     Normal Attention span and concentration     Normal Language, naming, repeating,spontaneous speech     Fund of knowledge   CRANIAL NERVES: CN II: Visual fields are full to confrontation. Pupils are round equal and briskly reactive to light. CN III, IV, VI: extraocular movement are normal. No ptosis. CN V: Facial sensation is intact to light touch CN VII: Face is symmetric with normal eye closure  CN VIII: Hearing is normal to causal conversation. CN IX, X: Phonation is normal. CN XI: Head turning and shoulder shrug are intact  MOTOR: Open wound at left second toe, no significant bilateral upper and lower extremity proximal and distal muscle weakness,  REFLEXES: Reflexes are hypoactive and symmetric at the biceps, triceps, knees, and absent at ankles. Plantar responses are flexor.  SENSORY: Length dependent decreased light touch, pinprick, vibratory sensation to below knee level  COORDINATION: There is no trunk or limb dysmetria noted.  GAIT/STANCE: He needs push-up to get up from seated position, cautious    DIAGNOSTIC DATA (LABS, IMAGING, TESTING) - I reviewed patient records, labs, notes, testing and imaging myself where available.   ASSESSMENT AND PLAN  Kaloni Bisaillon is a 64 y.o. female   Peripheral neuropathy Restless leg symptoms  Laboratory evaluations for potential etiology of peripheral  neuropathy, most likely related to her long history of diabetes, also will check ferritin level  Stop Sinemet to  avoid augmentation  Keep Cymbalta 60 mg daily, increase Trileptal 150 mg in the morning, 2 tablets at night, Lyrica 171m 1 in the morning, 2 tablets at night     YMarcial Pacas M.D. Ph.D.  [facility] [address] [place] [id number]: [phone]: [phone] CC:  Dettinger, JFransisca Kaufmann MD [address]  [place] [id number]

## 2020-10-14 ENCOUNTER — Encounter: Payer: Self-pay | Admitting: Psychology

## 2020-10-14 ENCOUNTER — Encounter: Payer: PPO | Attending: Psychology | Admitting: Psychology

## 2020-10-14 ENCOUNTER — Other Ambulatory Visit: Payer: Self-pay

## 2020-10-14 DIAGNOSIS — F411 Generalized anxiety disorder: Secondary | ICD-10-CM

## 2020-10-14 DIAGNOSIS — E1142 Type 2 diabetes mellitus with diabetic polyneuropathy: Secondary | ICD-10-CM

## 2020-10-14 DIAGNOSIS — F331 Major depressive disorder, recurrent, moderate: Secondary | ICD-10-CM

## 2020-10-14 NOTE — Progress Notes (Signed)
Patient ID: Rhonda Thomas, female   DOB: 1956/07/25, 64 y.o.   MRN: [id number] [name]   DOB: Aug 09, 1956  MR Number: [id number]  Location: [facility] [place]IC[facility] REHABILITATION [place], [place] [id number] [place] [id number] Dept: [phone]  Start: 9 AM End: 10 AM  Today's visit was a 1 hour in person visit that was conducted in my outpatient clinic office.  The patient and myself were present.  Provider/Observer:     Edgardo Roys PsyD  Chief Complaint:      Chief Complaint  Patient presents with  . Pain  . Anxiety  . Stress    Reason For Service:     I have worked with the patient for several years now. She was initially referred here for difficulty coping with the situation involving her children. Her youngest son is at severe difficulties over the years with regard to mood disorder, substance abuse and behavioral problems. Now there are major stressors associated with him again the patient has been essentially overwhelmed and unsure about what to do about. She reports that this has created a lot of stress both at home and at work. On top of that, her diabetes has gotten to the point that she had hammertoe amputated because of that. She has neuropathy as a result of diabetes as well and has had to stay out of her foot for 8 weeks.  The psychosocial stressors are having significant negative impact upon her severe diabetes with neuropathy and loss of toe.  The patient continues to have recurrent issues with residual effects of her type 2 diabetes with significant medical complications.  The reason for service has been reviewed and remains applicable for the current visit.  The patient reports that her son has continued to do well as far she knows living in [place] with his new wife.  The wife is older than him and has grown children which to add some level of stability to  her son and he is maintaining good work patterns.  He does call she and her husband roughly 8 times per day which are somewhat stressful but most of the time they are manageable.  Interventions Strategy:  Cognitive/behavioral therapeutic interventions and working on issues related to stress and her brittle diabetes/pain issues.  Participation Level:   Active  Participation Quality:  Appropriate      Behavioral Observation:  Well Groomed, Alert, and Appropriate.   Current Psychosocial Factors:   The patient reports that her son is now gotten married and has moved to [place].  She reports that this is dramatically reduce the stress at home and she feels like she is managing her blood glucose better with the reduced stress.  The patient reports that she is able to wear a regular shoe now and is hoping to keep any foot ulcers from forming with improved diabetic response.  Content of Session:   Reviewed current symptoms and continue to work on coping skills in dealing with both her medical issues as well as the stressors.  Current Status:   The patient reports that she has been able to finish her spinal cord stimulator surgery and reports that she is experiencing a 50-60% improvement in her pain levels.     Last Reviewed:   10/14/2020  Goals Addressed Today:    Goals addressed included building better coping skills.  Impression/Diagnosis:   The patient has a long history  of attention deficit disorder but due to Maj. psychosocial stressors she also developed clinical depression and anxiety. I think that her attentional problems were valid prior to the development of these issues that existed her entire life. However, she is in and repeat if and recurrent trauma with regard primarily to her son and to her daughter to a lesser degree in years past.     Edgardo Roys, PsyD 10/14/2020

## 2020-10-15 ENCOUNTER — Ambulatory Visit (INDEPENDENT_AMBULATORY_CARE_PROVIDER_SITE_OTHER): Payer: PPO | Admitting: Family Medicine

## 2020-10-15 ENCOUNTER — Encounter: Payer: Self-pay | Admitting: Family Medicine

## 2020-10-15 ENCOUNTER — Telehealth: Payer: Self-pay | Admitting: Neurology

## 2020-10-15 VITALS — BP 118/68 | HR 90 | Temp 97.4°F | Ht 69.0 in | Wt 195.8 lb

## 2020-10-15 DIAGNOSIS — E11621 Type 2 diabetes mellitus with foot ulcer: Secondary | ICD-10-CM

## 2020-10-15 DIAGNOSIS — E1142 Type 2 diabetes mellitus with diabetic polyneuropathy: Secondary | ICD-10-CM | POA: Diagnosis not present

## 2020-10-15 DIAGNOSIS — F411 Generalized anxiety disorder: Secondary | ICD-10-CM | POA: Diagnosis not present

## 2020-10-15 DIAGNOSIS — L97521 Non-pressure chronic ulcer of other part of left foot limited to breakdown of skin: Secondary | ICD-10-CM | POA: Diagnosis not present

## 2020-10-15 LAB — MULTIPLE MYELOMA PANEL, SERUM
Albumin SerPl Elph-Mcnc: 3.3 g/dL (ref 2.9–4.4)
Albumin/Glob SerPl: 1.2 (ref 0.7–1.7)
Alpha 1: 0.3 g/dL (ref 0.0–0.4)
Alpha2 Glob SerPl Elph-Mcnc: 0.8 g/dL (ref 0.4–1.0)
B-Globulin SerPl Elph-Mcnc: 1 g/dL (ref 0.7–1.3)
Gamma Glob SerPl Elph-Mcnc: 0.9 g/dL (ref 0.4–1.8)
Globulin, Total: 2.9 g/dL (ref 2.2–3.9)
IgA/Immunoglobulin A, Serum: 232 mg/dL (ref 87–352)
IgG (Immunoglobin G), Serum: 909 mg/dL (ref 586–1602)
IgM (Immunoglobulin M), Srm: 72 mg/dL (ref 26–217)
Total Protein: 6.2 g/dL (ref 6.0–8.5)

## 2020-10-15 LAB — SEDIMENTATION RATE: Sed Rate: 3 mm/hr (ref 0–40)

## 2020-10-15 LAB — C-REACTIVE PROTEIN: CRP: 3 mg/L (ref 0–10)

## 2020-10-15 LAB — VITAMIN D 25 HYDROXY (VIT D DEFICIENCY, FRACTURES): Vit D, 25-Hydroxy: 17.4 ng/mL — ABNORMAL LOW (ref 30.0–100.0)

## 2020-10-15 LAB — FERRITIN: Ferritin: 41 ng/mL (ref 15–150)

## 2020-10-15 LAB — ANA W/REFLEX IF POSITIVE: Anti Nuclear Antibody (ANA): NEGATIVE

## 2020-10-15 LAB — COPPER, SERUM: Copper: 88 ug/dL (ref 80–158)

## 2020-10-15 NOTE — Progress Notes (Signed)
BP 118/68   Pulse 90   Temp (!) 97.4 F (36.3 C) (Temporal)   Ht _0  (1.753 m)   Wt 195 lb 12.8 oz (88.8 kg)   BMI 28.91 kg/m    Subjective:   Patient ID: Rhonda Thomas, female    DOB: 11-Dec-1956, 64 y.o.   MRN: [id number]  HPI: Rhonda Thomas is a 64 y.o. female presenting on 10/15/2020 for Peripheral Neuropathy (4 wk rck) and Nail Problem (left second toe)   HPI Is a sore on the end of her left second toe right on the end that an ulcer about 0.25 cm in size, she does not have any pain with it or redness.  She denies any pain or redness or warmth going up in her toe.  She has fought the same sore before and she has creams that she uses.  Patient is coming for recheck of neuropathy.  She is on Cymbalta and says that it is helping her.  She did not continue forward with it and says that is helping her anxiety and her neuropathy.  Relevant past medical, surgical, family and social history reviewed and updated as indicated. Interim medical history since our last visit reviewed. Allergies and medications reviewed and updated.  Review of Systems  Constitutional: Negative for chills and fever.  Eyes: Negative for visual disturbance.  Respiratory: Negative for chest tightness and shortness of breath.   Cardiovascular: Negative for chest pain and leg swelling.  Skin: Positive for wound (Second toe wound). Negative for rash.  Psychiatric/Behavioral: Negative for agitation and behavioral problems.  All other systems reviewed and are negative.   Per HPI unless specifically indicated above   Allergies as of 10/15/2020      Reactions   Actos [pioglitazone]    Edema   Invokana [canagliflozin]    vaginitis      Medication List       Accurate as of October 15, 2020  4:24 PM. If you have any questions, ask your nurse or doctor.        STOP taking these medications   carbidopa-levodopa 25-100 MG tablet Commonly known as: SINEMET IR Stopped by: Fransisca Kaufmann Rhema Boyett,  MD     TAKE these medications   Aspirin Low Dose 81 MG EC tablet Generic drug: aspirin TAKE 1 TABLET BY MOUTH EVERY DAY   Basaglar KwikPen 100 UNIT/ML Inject 20-30 Units into the skin at bedtime.   DULoxetine 60 MG capsule Commonly known as: CYMBALTA Take 1 capsule (60 mg total) by mouth at bedtime.   glucose monitoring kit monitoring kit Check glucose 4 times per day for insulin dependent diabetes with complications. Fill test strips #100 and Lancets #100 with PRN refills.   Insulin Pen Needle 31G X 5 MM Misc Commonly known as: Global Ease Inject Pen Needles USE FOUR TIMES DAILY   metFORMIN 500 MG tablet Commonly known as: GLUCOPHAGE Take 1 tablet (500 mg total) by mouth 2 (two) times daily with a meal.   NovoLOG FlexPen 100 UNIT/ML FlexPen Generic drug: insulin aspart Inject 3-20 Units into the skin 3 times/day as needed-between meals & bedtime for high blood sugar.   Nucynta ER 150 MG Tb12 Generic drug: Tapentadol HCl Take 1 tablet by mouth every 12 (twelve) hours.   OXcarbazepine 150 MG tablet Commonly known as: TRILEPTAL TAKE ONE TABLET BY MOUTH EVERY MORNING AND TAKE TWO TABLETS BY MOUTH AT BEDTIME   pregabalin 150 MG capsule Commonly known as: LYRICA 1 in am 2  at night   rOPINIRole 1 MG tablet Commonly known as: Requip Take 2 tablets (2 mg total) by mouth at bedtime.   rosuvastatin 5 MG tablet Commonly known as: CRESTOR Take 1 tablet (5 mg total) by mouth at bedtime.   Trulicity 1.5 TY/0.3EJ Sopn Generic drug: Dulaglutide Inject 1.5 mg into the skin once a week. inject 1.5 MG into SKIN ONCE EVERY WEEK        Objective:   BP 118/68   Pulse 90   Temp (!) 97.4 F (36.3 C) (Temporal)   Ht _0  (1.753 m)   Wt 195 lb 12.8 oz (88.8 kg)   BMI 28.91 kg/m   Wt Readings from Last 3 Encounters:  10/15/20 195 lb 12.8 oz (88.8 kg)  10/13/20 196 lb 8 oz (89.1 kg)  09/14/20 198 lb (89.8 kg)    Physical Exam Vitals and nursing note reviewed.    Constitutional:      General: She is not in acute distress.    Appearance: She is well-developed. She is not diaphoretic.  Eyes:     Conjunctiva/sclera: Conjunctivae normal.  Skin:    General: Skin is warm and dry.     Findings: Wound (0.2 cm ulceration on the end of the left second toe, superficial, no erythema or warmth.) present. No rash.  Neurological:     Mental Status: She is alert and oriented to person, place, and time.     Coordination: Coordination normal.  Psychiatric:        Behavior: Behavior normal.       Assessment & Plan:   Problem List Items Addressed This Visit      Endocrine   Diabetic peripheral neuropathy ([place])     Other   Generalized anxiety disorder - Primary    Other Visit Diagnoses    Diabetic ulcer of toe of left foot associated with type 2 diabetes mellitus, limited to breakdown of skin ([facility])          Placed Xeroform gauze and a simple bandage on top of recommended for the patient to change daily and come back in 1 to 2 weeks for recheck. Follow up plan: Return in about 2 weeks (around 10/29/2020), or if symptoms worsen or fail to improve, for Foot ulcer complete.  Counseling provided for all of the vaccine components No orders of the defined types were placed in this encounter.   Caryl Pina, MD [facility] 10/15/2020, 4:24 PM

## 2020-10-15 NOTE — Telephone Encounter (Signed)
Left patient a detailed message, with results and supplement recommendations, on voicemail (ok per DPR).  Provided our number to call back with any questions.

## 2020-10-15 NOTE — Telephone Encounter (Signed)
Laboratory evaluation showed vitamin D deficiency, level of 17, she would benefit over-the-counter vitamin D supplement 1000 units daily

## 2020-10-26 ENCOUNTER — Other Ambulatory Visit: Payer: Self-pay

## 2020-10-26 ENCOUNTER — Ambulatory Visit (INDEPENDENT_AMBULATORY_CARE_PROVIDER_SITE_OTHER): Payer: PPO | Admitting: Family Medicine

## 2020-10-26 ENCOUNTER — Encounter: Payer: Self-pay | Admitting: Family Medicine

## 2020-10-26 VITALS — BP 129/72 | HR 82 | Temp 98.0°F | Ht 69.0 in | Wt 199.0 lb

## 2020-10-26 DIAGNOSIS — L97521 Non-pressure chronic ulcer of other part of left foot limited to breakdown of skin: Secondary | ICD-10-CM

## 2020-10-26 DIAGNOSIS — E11621 Type 2 diabetes mellitus with foot ulcer: Secondary | ICD-10-CM

## 2020-10-26 NOTE — Progress Notes (Signed)
   BP 129/72   Pulse 82   Temp 98 F (36.7 C)   Ht 5\' 9"  (1.753 m)   Wt 199 lb (90.3 kg)   SpO2 96%   BMI 29.39 kg/m    Subjective:   Patient ID: Rhonda Thomas, female    DOB: 06-Jan-1956, 64 y.o.   MRN: [id number]  HPI: Rhonda Thomas is a 64 y.o. female presenting on 10/26/2020 for Wound Check (left 2nd toe)   HPI Patient is coming in today for recheck of left second toe wound on the end of her toe, she feels like is getting some better, she has no sensation there.  She has been doing daily dressing changes with the Xeroform gauze and bandage.  She is also been trying to keep it moist.  She denies any pain or redness or warmth.  Patient denies any drainage or purulence.  Relevant past medical, surgical, family and social history reviewed and updated as indicated. Interim medical history since our last visit reviewed. Allergies and medications reviewed and updated.  Review of Systems  Constitutional: Negative for chills and fever.  Eyes: Negative for visual disturbance.  Respiratory: Negative for chest tightness and shortness of breath.   Cardiovascular: Negative for chest pain and leg swelling.  Skin: Positive for wound. Negative for rash.  Neurological: Negative for light-headedness and headaches.  Psychiatric/Behavioral: Negative for agitation and behavioral problems.  All other systems reviewed and are negative.   Per HPI unless specifically indicated above   Objective:   BP 129/72   Pulse 82   Temp 98 F (36.7 C)   Ht 5\' 9"  (1.753 m)   Wt 199 lb (90.3 kg)   SpO2 96%   BMI 29.39 kg/m   Wt Readings from Last 3 Encounters:  10/26/20 199 lb (90.3 kg)  10/15/20 195 lb 12.8 oz (88.8 kg)  10/13/20 196 lb 8 oz (89.1 kg)    Physical Exam Vitals and nursing note reviewed.  Constitutional:      General: She is not in acute distress.    Appearance: She is well-developed. She is not diaphoretic.  Eyes:     Conjunctiva/sclera: Conjunctivae normal.    Skin:    General: Skin is warm and dry.     Findings: Lesion (0.2 cm ulcer, no erythema or warmth) present. No rash.  Neurological:     Mental Status: She is alert and oriented to person, place, and time.     Coordination: Coordination normal.  Psychiatric:        Behavior: Behavior normal.       Assessment & Plan:   Problem List Items Addressed This Visit    None    Visit Diagnoses    Diabetic ulcer of toe of left foot associated with type 2 diabetes mellitus, limited to breakdown of skin ([place])    -  Primary      Left 2nd toe, no major change, no signs of infection, continue current treatment of changing dressing daily Follow up plan: Return in about 2 weeks (around 11/09/2020), or if symptoms worsen or fail to improve, for Left toe wound recheck.  Counseling provided for all of the vaccine components No orders of the defined types were placed in this encounter.   Caryl Pina, MD [facility] 10/26/2020, 12:22 PM

## 2020-11-03 ENCOUNTER — Other Ambulatory Visit: Payer: Self-pay | Admitting: Family Medicine

## 2020-11-03 DIAGNOSIS — G2581 Restless legs syndrome: Secondary | ICD-10-CM

## 2020-11-11 ENCOUNTER — Ambulatory Visit: Payer: PPO | Admitting: Family Medicine

## 2020-11-16 ENCOUNTER — Encounter: Payer: PPO | Attending: Psychology | Admitting: Psychology

## 2020-11-16 ENCOUNTER — Other Ambulatory Visit: Payer: Self-pay

## 2020-11-16 DIAGNOSIS — F331 Major depressive disorder, recurrent, moderate: Secondary | ICD-10-CM | POA: Insufficient documentation

## 2020-11-16 DIAGNOSIS — F411 Generalized anxiety disorder: Secondary | ICD-10-CM | POA: Diagnosis not present

## 2020-11-16 DIAGNOSIS — E1142 Type 2 diabetes mellitus with diabetic polyneuropathy: Secondary | ICD-10-CM | POA: Insufficient documentation

## 2020-11-17 NOTE — Progress Notes (Signed)
Patient ID: Rhonda Thomas, female   DOB: May 31, 1956, 64 y.o.   MRN: [id number] [name]   DOB: Oct 02, 1956  MR Number: [id number]  Location: [facility] FOR [facility] REHABILITATIVE MEDICINE [facility] PHYSICAL MEDICINE AND REHABILITATION [place], [place] [id number] [place] [address] Dept: [phone]  Start: 2 PM End: 3 PM  Today's visit was a 1 hour in person visit that was conducted in my outpatient clinic office.  The patient and myself were present.  Provider/Observer:     Edgardo Roys PsyD  Chief Complaint:      Chief Complaint  Patient presents with  . Pain  . Anxiety  . Stress    Reason For Service:     I have worked with the patient for several years now. She was initially referred here for difficulty coping with the situation involving her children. Her youngest son is at severe difficulties over the years with regard to mood disorder, substance abuse and behavioral problems. Now there are major stressors associated with him again the patient has been essentially overwhelmed and unsure about what to do about. She reports that this has created a lot of stress both at home and at work. On top of that, her diabetes has gotten to the point that she had hammertoe amputated because of that. She has neuropathy as a result of diabetes as well and has had to stay out of her foot for 8 weeks.  The psychosocial stressors are having significant negative impact upon her severe diabetes with neuropathy and loss of toe.  The patient continues to have recurrent issues with residual effects of her type 2 diabetes with significant medical complications.  The reason for service has been reviewed and remains applicable for the current visit.  The patient reports that her son has continued to do well as far she knows living in [place] with his new wife.  The wife is older than him and has grown children which to add some level of stability to  her son and he is maintaining good work patterns.  He does call she and her husband roughly 8 times per day which are somewhat stressful but most of the time they are manageable.  Interventions Strategy:  Cognitive/behavioral therapeutic interventions and working on issues related to stress and her brittle diabetes/pain issues.  Participation Level:   Active  Participation Quality:  Appropriate      Behavioral Observation:  Well Groomed, Alert, and Appropriate.   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  She is also been doing a lot for her daughter's upcoming wedding which has been very stressful and time-consuming with reduced amount of sleep.  The daughter's wedding will be completed in approximately a week or so and significantly reduce her overall stress levels.  Content of Session:   Reviewed current symptoms and continue to work on coping skills in dealing with both her medical issues as well as the stressors.  Current Status:   The patient reports that she has been able to finish her spinal cord stimulator surgery and reports that she is experiencing a 50-60% improvement in her pain levels.     Last Reviewed:   11/16/2020   Goals Addressed Today:    Goals addressed included building better coping skills.  Impression/Diagnosis:   The patient has a long history of attention deficit disorder but due to Maj. psychosocial stressors  she also developed clinical depression and anxiety. I think that her attentional problems were valid prior to the development of these issues that existed her entire life. However, she is in and repeat if and recurrent trauma with regard primarily to her son and to her daughter to a lesser degree in years past.     Edgardo Roys, PsyD 11/17/2020

## 2020-12-01 DIAGNOSIS — M86171 Other acute osteomyelitis, right ankle and foot: Secondary | ICD-10-CM | POA: Diagnosis not present

## 2020-12-01 DIAGNOSIS — M79671 Pain in right foot: Secondary | ICD-10-CM | POA: Diagnosis not present

## 2020-12-10 DIAGNOSIS — M86172 Other acute osteomyelitis, left ankle and foot: Secondary | ICD-10-CM | POA: Diagnosis not present

## 2020-12-10 DIAGNOSIS — R601 Generalized edema: Secondary | ICD-10-CM | POA: Diagnosis not present

## 2020-12-24 DIAGNOSIS — E1140 Type 2 diabetes mellitus with diabetic neuropathy, unspecified: Secondary | ICD-10-CM | POA: Diagnosis not present

## 2020-12-24 DIAGNOSIS — M79671 Pain in right foot: Secondary | ICD-10-CM | POA: Diagnosis not present

## 2020-12-24 DIAGNOSIS — M79672 Pain in left foot: Secondary | ICD-10-CM | POA: Diagnosis not present

## 2020-12-24 DIAGNOSIS — G2581 Restless legs syndrome: Secondary | ICD-10-CM | POA: Diagnosis not present

## 2020-12-24 DIAGNOSIS — G894 Chronic pain syndrome: Secondary | ICD-10-CM | POA: Diagnosis not present

## 2020-12-31 DIAGNOSIS — M86172 Other acute osteomyelitis, left ankle and foot: Secondary | ICD-10-CM | POA: Diagnosis not present

## 2020-12-31 DIAGNOSIS — M79675 Pain in left toe(s): Secondary | ICD-10-CM | POA: Diagnosis not present

## 2021-01-07 ENCOUNTER — Encounter: Payer: HMO | Attending: Psychology | Admitting: Psychology

## 2021-01-07 ENCOUNTER — Other Ambulatory Visit: Payer: Self-pay

## 2021-01-07 DIAGNOSIS — F331 Major depressive disorder, recurrent, moderate: Secondary | ICD-10-CM | POA: Insufficient documentation

## 2021-01-07 DIAGNOSIS — E1142 Type 2 diabetes mellitus with diabetic polyneuropathy: Secondary | ICD-10-CM | POA: Diagnosis not present

## 2021-01-07 DIAGNOSIS — F411 Generalized anxiety disorder: Secondary | ICD-10-CM | POA: Diagnosis not present

## 2021-01-08 DIAGNOSIS — M65352 Trigger finger, left little finger: Secondary | ICD-10-CM | POA: Insufficient documentation

## 2021-01-08 DIAGNOSIS — G5602 Carpal tunnel syndrome, left upper limb: Secondary | ICD-10-CM | POA: Diagnosis not present

## 2021-01-08 DIAGNOSIS — G5601 Carpal tunnel syndrome, right upper limb: Secondary | ICD-10-CM | POA: Diagnosis not present

## 2021-01-14 ENCOUNTER — Ambulatory Visit: Payer: PPO | Admitting: Neurology

## 2021-01-17 NOTE — Progress Notes (Signed)
Patient ID: Rhonda Thomas, female   DOB: 11/10/1956, 65 y.o.   MRN: [id number] [name]   DOB: 1956/01/23  MR Number: [id number]  Location: [facility] PHYSICAL MEDICINE AND REHABILITATION [place], [place] [id number] [place] [address] Dept: [phone]  Start: 9 AM End: 10 AM  Today's visit was a 1 hour in person visit that was conducted in my outpatient clinic office.  The patient and myself were present.  Provider/Observer:     Edgardo Roys PsyD  Chief Complaint:      Chief Complaint  Patient presents with  . Pain  . Anxiety  . Depression    Reason For Service:     I have worked with the patient for several years now. She was initially referred here for difficulty coping with the situation involving her children. Her youngest son is at severe difficulties over the years with regard to mood disorder, substance abuse and behavioral problems. Now there are major stressors associated with him again the patient has been essentially overwhelmed and unsure about what to do about. She reports that this has created a lot of stress both at home and at work. On top of that, her diabetes has gotten to the point that she had hammertoe amputated because of that. She has neuropathy as a result of diabetes as well and has had to stay out of her foot for 8 weeks.  The psychosocial stressors are having significant negative impact upon her severe diabetes with neuropathy and loss of toe.  The patient continues to have recurrent issues with residual effects of her type 2 diabetes with significant medical complications.  The reason for service has been reviewed and remains applicable for the current visit.  The patient reports that her son has continued to do well as far she knows living in [place] with his new wife.  The wife is older than him and has grown children which to add some level of stability  to her son and he is maintaining good work patterns.  He does call she and her husband roughly 8 times per day which are somewhat stressful but most of the time they are manageable.  Interventions Strategy:  Cognitive/behavioral therapeutic interventions and working on issues related to stress and her brittle diabetes/pain issues.  Participation Level:   Active  Participation Quality:  Appropriate      Behavioral Observation:  Well Groomed, Alert, and Appropriate.   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  She is also been doing a lot for her daughter's upcoming wedding which has been very stressful and time-consuming with reduced amount of sleep.  The daughter's wedding will be completed in approximately a week or so and significantly reduce her overall stress levels.  Content of Session:   Reviewed current symptoms and continue to work on coping skills in dealing with both her medical issues as well as the stressors.  Current Status:   The patient reports that she has been able to finish her spinal cord stimulator surgery and reports that she is experiencing a 50-60% improvement in her pain levels.     Last Reviewed:   01/07/2021  Goals Addressed Today:    Goals addressed included building better coping skills.  Impression/Diagnosis:   The patient has a long history of attention deficit disorder but due to Maj. psychosocial stressors she  also developed clinical depression and anxiety. I think that her attentional problems were valid prior to the development of these issues that existed her entire life. However, she is in and repeat if and recurrent trauma with regard primarily to her son and to her daughter to a lesser degree in years past.     Edgardo Roys, PsyD 01/17/2021

## 2021-01-28 DIAGNOSIS — L97511 Non-pressure chronic ulcer of other part of right foot limited to breakdown of skin: Secondary | ICD-10-CM | POA: Diagnosis not present

## 2021-01-28 DIAGNOSIS — E1142 Type 2 diabetes mellitus with diabetic polyneuropathy: Secondary | ICD-10-CM | POA: Diagnosis not present

## 2021-02-04 ENCOUNTER — Encounter: Payer: Self-pay | Admitting: Psychology

## 2021-02-04 ENCOUNTER — Other Ambulatory Visit: Payer: Self-pay

## 2021-02-04 ENCOUNTER — Encounter: Payer: HMO | Attending: Psychology | Admitting: Psychology

## 2021-02-04 DIAGNOSIS — F331 Major depressive disorder, recurrent, moderate: Secondary | ICD-10-CM | POA: Diagnosis not present

## 2021-02-04 DIAGNOSIS — F411 Generalized anxiety disorder: Secondary | ICD-10-CM | POA: Diagnosis not present

## 2021-02-04 DIAGNOSIS — E1142 Type 2 diabetes mellitus with diabetic polyneuropathy: Secondary | ICD-10-CM | POA: Diagnosis not present

## 2021-02-04 NOTE — Progress Notes (Signed)
Patient ID: Rhonda Thomas, female   DOB: [birth year], 65 y.o.   MRN: [id number] [name]   DOB: 05-19-1956  MR Number: [id number]  Location: [facility] PHYSICAL MEDICINE AND REHABILITATION [place], [place] [id number] [place] [address] Dept: [phone]  Start: 11 AM End: 12 PM  Today's visit was 1 hour in person visit that was conducted in my outpatient clinic office with the patient myself present.  Provider/Observer:     Edgardo Roys PsyD  Chief Complaint:      Chief Complaint  Patient presents with  . Anxiety  . Stress  . Pain    Reason For Service:     I have worked with the patient for several years now. She was initially referred here for difficulty coping with the situation involving her children. Her youngest son is at severe difficulties over the years with regard to mood disorder, substance abuse and behavioral problems. Now there are major stressors associated with him again the patient has been essentially overwhelmed and unsure about what to do about. She reports that this has created a lot of stress both at home and at work. On top of that, her diabetes has gotten to the point that she had hammertoe amputated because of that. She has neuropathy as a result of diabetes as well and has had to stay out of her foot for 8 weeks.  The psychosocial stressors are having significant negative impact upon her severe diabetes with neuropathy and loss of toe.  The patient continues to have recurrent issues with residual effects of her type 2 diabetes with significant medical complications.  The reason for service has been reviewed and remains applicable for the current visit.  The patient reports that she has had a difficult time over the past week or so.  She reports that she hit her medications as her granddaughter was coming over for visit and has been unable to find her  medications.  She was able to get a refill for her pain medications as they were due for refill anyway.  She will not be able to refill her Cymbalta or Neurontin until 2 days from now.  She still has a couple of places that she may look better very unlikely places for her to have hit her medications.  Her son that had a history of taking medications from her does not live in [place] anymore and this does not appear to have been an issue.   Interventions Strategy:  Cognitive/behavioral therapeutic interventions and working on issues related to stress and her brittle diabetes/pain issues.  Participation Level:   Active  Participation Quality:  Appropriate      Behavioral Observation:  Well Groomed, Alert, and Appropriate.   Current Psychosocial Factors:   The patient reports that some of the stressors associated with her daughter's wedding have resolved but she is concerned about her oldest son's difficulties after he moved to [place] to take a teaching position.  This was a very stressful mood for her son and he has been able to return as a substitute teacher some.  Content of Session:   Reviewed current symptoms and continue to work on coping skills in dealing with both her medical issues as well as the stressors.  Current Status:   The patient reports that she has been able to finish her spinal cord stimulator surgery and reports that she is experiencing a 50-60% improvement  in her pain levels.     Last Reviewed:   02/04/2021  Goals Addressed Today:    Goals addressed included building better coping skills.  Impression/Diagnosis:   The patient has a long history of attention deficit disorder but due to Maj. psychosocial stressors she also developed clinical depression and anxiety. I think that her attentional problems were valid prior to the development of these issues that existed her entire life. However, she is in and repeat if and recurrent trauma with regard primarily to her son and to  her daughter to a lesser degree in years past.     Edgardo Roys, PsyD 02/04/2021

## 2021-02-09 DIAGNOSIS — L97511 Non-pressure chronic ulcer of other part of right foot limited to breakdown of skin: Secondary | ICD-10-CM | POA: Diagnosis not present

## 2021-02-18 DIAGNOSIS — E1140 Type 2 diabetes mellitus with diabetic neuropathy, unspecified: Secondary | ICD-10-CM | POA: Diagnosis not present

## 2021-02-18 DIAGNOSIS — G2581 Restless legs syndrome: Secondary | ICD-10-CM | POA: Diagnosis not present

## 2021-02-18 DIAGNOSIS — G894 Chronic pain syndrome: Secondary | ICD-10-CM | POA: Diagnosis not present

## 2021-02-18 DIAGNOSIS — M79671 Pain in right foot: Secondary | ICD-10-CM | POA: Diagnosis not present

## 2021-02-18 DIAGNOSIS — M79672 Pain in left foot: Secondary | ICD-10-CM | POA: Diagnosis not present

## 2021-04-01 ENCOUNTER — Ambulatory Visit: Payer: BC Managed Care – PPO | Admitting: Neurology

## 2021-04-12 ENCOUNTER — Other Ambulatory Visit: Payer: Self-pay | Admitting: Neurology

## 2021-04-12 DIAGNOSIS — E1049 Type 1 diabetes mellitus with other diabetic neurological complication: Secondary | ICD-10-CM

## 2021-04-13 ENCOUNTER — Ambulatory Visit: Payer: BC Managed Care – PPO | Admitting: Neurology

## 2021-04-13 ENCOUNTER — Other Ambulatory Visit: Payer: Self-pay | Admitting: Neurology

## 2021-04-13 DIAGNOSIS — E1049 Type 1 diabetes mellitus with other diabetic neurological complication: Secondary | ICD-10-CM

## 2021-04-13 MED ORDER — PREGABALIN 150 MG PO CAPS: ORAL_CAPSULE | ORAL | 0 refills | Status: AC

## 2021-04-13 NOTE — Telephone Encounter (Signed)
[facility] ([facility]) called, prescription was denied for  pregabalin (LYRICA) 150 MG capsule. Patient has no refills left, she has used up all 5 refills, just wanted to clarify. Would like a call from the nurse

## 2021-04-14 ENCOUNTER — Encounter: Payer: HMO | Admitting: Psychology

## 2021-04-15 DIAGNOSIS — G894 Chronic pain syndrome: Secondary | ICD-10-CM | POA: Diagnosis not present

## 2021-04-15 DIAGNOSIS — Z5181 Encounter for therapeutic drug level monitoring: Secondary | ICD-10-CM | POA: Diagnosis not present

## 2021-04-15 DIAGNOSIS — M79672 Pain in left foot: Secondary | ICD-10-CM | POA: Diagnosis not present

## 2021-04-15 DIAGNOSIS — E1140 Type 2 diabetes mellitus with diabetic neuropathy, unspecified: Secondary | ICD-10-CM | POA: Diagnosis not present

## 2021-04-15 DIAGNOSIS — G2581 Restless legs syndrome: Secondary | ICD-10-CM | POA: Diagnosis not present

## 2021-04-15 DIAGNOSIS — M79671 Pain in right foot: Secondary | ICD-10-CM | POA: Diagnosis not present

## 2021-04-15 DIAGNOSIS — Z79899 Other long term (current) drug therapy: Secondary | ICD-10-CM | POA: Diagnosis not present

## 2021-04-21 ENCOUNTER — Other Ambulatory Visit: Payer: Self-pay | Admitting: Family Medicine

## 2021-05-07 ENCOUNTER — Other Ambulatory Visit: Payer: HMO

## 2021-05-07 DIAGNOSIS — R300 Dysuria: Secondary | ICD-10-CM

## 2021-05-07 LAB — URINALYSIS, COMPLETE
Bilirubin, UA: NEGATIVE
Leukocytes,UA: NEGATIVE
Nitrite, UA: POSITIVE — AB
RBC, UA: NEGATIVE
Specific Gravity, UA: 1.025 (ref 1.005–1.030)
Urobilinogen, Ur: 0.2 mg/dL (ref 0.2–1.0)
pH, UA: 5.5 (ref 5.0–7.5)

## 2021-05-07 LAB — MICROSCOPIC EXAMINATION
Epithelial Cells (non renal): NONE SEEN /hpf (ref 0–10)
RBC, Urine: NONE SEEN /hpf (ref 0–2)

## 2021-05-07 MED ORDER — CEPHALEXIN 500 MG PO CAPS: 500.0000 mg | ORAL_CAPSULE | Freq: Four times a day (QID) | ORAL | 0 refills | Status: DC

## 2021-05-12 LAB — URINE CULTURE

## 2021-05-17 ENCOUNTER — Other Ambulatory Visit: Payer: Self-pay

## 2021-05-17 ENCOUNTER — Ambulatory Visit (INDEPENDENT_AMBULATORY_CARE_PROVIDER_SITE_OTHER): Payer: HMO | Admitting: Family Medicine

## 2021-05-17 ENCOUNTER — Encounter: Payer: Self-pay | Admitting: Family Medicine

## 2021-05-17 VITALS — BP 138/8 | HR 81 | Temp 97.9°F | Resp 20 | Ht 69.0 in | Wt 198.0 lb

## 2021-05-17 DIAGNOSIS — G479 Sleep disorder, unspecified: Secondary | ICD-10-CM | POA: Diagnosis not present

## 2021-05-17 DIAGNOSIS — R413 Other amnesia: Secondary | ICD-10-CM | POA: Diagnosis not present

## 2021-05-17 DIAGNOSIS — Z794 Long term (current) use of insulin: Secondary | ICD-10-CM | POA: Diagnosis not present

## 2021-05-17 DIAGNOSIS — E1169 Type 2 diabetes mellitus with other specified complication: Secondary | ICD-10-CM | POA: Diagnosis not present

## 2021-05-17 MED ORDER — FREESTYLE LIBRE 14 DAY SENSOR MISC: 1.0000 | 3 refills | Status: DC

## 2021-05-17 NOTE — Progress Notes (Signed)
BP (!) 138/8   Pulse 81   Temp 97.9 F (36.6 C) (Temporal)   Resp 20   Ht 5' 9"  (1.753 m)   Wt 198 lb (89.8 kg)   SpO2 95%   BMI 29.24 kg/m    Subjective:   Patient ID: Rhonda Thomas, female    DOB: 1956-08-24, 65 y.o.   MRN: [id number]  HPI: Rhonda Thomas is a 65 y.o. female presenting on 05/17/2021 for No chief complaint on file.   HPI Patient is coming in today with complaints of falling asleep easily during the daytime.  She says she will just be sitting there and she does not fall out or fall asleep.  She says sometimes she is falling down to the point where she is even hit the floor and bumped her head.  She is falling asleep on the toilet and falling asleep while reading to her granddaughter.  Patient does admit that she has an abnormal sleep schedule and is currently sleeping between 1 and 9 AM and says that even when she wakes up she does not feel refreshed or does not feel like she has a good sleep.  She does have an appoint with neurology in [place] in July.  She also complains of having more forgetfulness and decreased memory issues.  She says her energy is down and she just not feeling well.  She denies any other medication changes but although she does admit that she not been keeping track of her blood sugars very well at all.  Patient would like freestyle libre for diabetes and we will recheck her diabetes when she comes back in a month.  Relevant past medical, surgical, family and social history reviewed and updated as indicated. Interim medical history since our last visit reviewed. Allergies and medications reviewed and updated.  Review of Systems  Constitutional: Negative for chills and fever.  Eyes: Negative for visual disturbance.  Respiratory: Negative for chest tightness and shortness of breath.   Cardiovascular: Negative for chest pain and leg swelling.  Musculoskeletal: Negative for back pain and gait problem.  Skin: Negative for rash.   Neurological: Negative for dizziness, weakness, light-headedness, numbness and headaches.  Psychiatric/Behavioral: Positive for decreased concentration and sleep disturbance. Negative for agitation, behavioral problems, confusion, dysphoric mood, self-injury and suicidal ideas. The patient is not nervous/anxious.   All other systems reviewed and are negative.   Per HPI unless specifically indicated above   Allergies as of 05/17/2021      Reactions   Actos [pioglitazone]    Edema   Invokana [canagliflozin]    vaginitis      Medication List       Accurate as of May 17, 2021  1:56 PM. If you have any questions, ask your nurse or doctor.        Aspirin Low Dose 81 MG EC tablet Generic drug: aspirin TAKE 1 TABLET BY MOUTH EVERY DAY   Basaglar KwikPen 100 UNIT/ML Inject 20-30 Units into the skin at bedtime. (NEEDS TO BE SEEN BEFORE NEXT REFILL)   cephALEXin 500 MG capsule Commonly known as: KEFLEX Take 1 capsule (500 mg total) by mouth 4 (four) times daily.   DULoxetine 60 MG capsule Commonly known as: CYMBALTA Take 1 capsule (60 mg total) by mouth at bedtime.   glucose monitoring kit monitoring kit Check glucose 4 times per day for insulin dependent diabetes with complications. Fill test strips #100 and Lancets #100 with PRN refills.   Insulin Pen Needle 31G  X 5 MM Misc Commonly known as: Global Ease Inject Pen Needles USE FOUR TIMES DAILY   metFORMIN 500 MG tablet Commonly known as: GLUCOPHAGE Take 1 tablet (500 mg total) by mouth 2 (two) times daily with a meal.   NovoLOG FlexPen 100 UNIT/ML FlexPen Generic drug: insulin aspart Inject 3-20 Units into the skin 3 times/day as needed-between meals & bedtime for high blood sugar.   Nucynta ER 150 MG Tb12 Generic drug: Tapentadol HCl Take 1 tablet by mouth every 12 (twelve) hours.   OXcarbazepine 150 MG tablet Commonly known as: TRILEPTAL TAKE ONE TABLET BY MOUTH EVERY MORNING AND TAKE TWO TABLETS BY MOUTH AT  BEDTIME   pregabalin 150 MG capsule Commonly known as: LYRICA 1 in am 2 at night   rOPINIRole 1 MG tablet Commonly known as: Requip Take 2 tablets (2 mg total) by mouth at bedtime.   rosuvastatin 5 MG tablet Commonly known as: CRESTOR Take 1 tablet (5 mg total) by mouth at bedtime.   Trulicity 1.5 LY/6.5KP Sopn Generic drug: Dulaglutide Inject 1.5 mg into the skin once a week. inject 1.5 MG into SKIN ONCE EVERY WEEK   VITAMIN D PO Take 1,000 Units by mouth daily.        Objective:   BP (!) 138/8   Pulse 81   Temp 97.9 F (36.6 C) (Temporal)   Resp 20   Ht 5' 9"  (1.753 m)   Wt 198 lb (89.8 kg)   SpO2 95%   BMI 29.24 kg/m   Wt Readings from Last 3 Encounters:  05/17/21 198 lb (89.8 kg)  10/26/20 199 lb (90.3 kg)  10/15/20 195 lb 12.8 oz (88.8 kg)    Physical Exam Vitals and nursing note reviewed.  Constitutional:      General: She is not in acute distress.    Appearance: She is well-developed. She is not diaphoretic.  Eyes:     Conjunctiva/sclera: Conjunctivae normal.  Cardiovascular:     Rate and Rhythm: Normal rate and regular rhythm.     Heart sounds: Normal heart sounds. No murmur heard.   Pulmonary:     Effort: Pulmonary effort is normal. No respiratory distress.     Breath sounds: Normal breath sounds. No wheezing.  Musculoskeletal:        General: No tenderness. Normal range of motion.  Skin:    General: Skin is warm and dry.     Findings: No rash.  Neurological:     Mental Status: She is alert and oriented to person, place, and time.     Coordination: Coordination normal.  Psychiatric:        Behavior: Behavior normal.       Assessment & Plan:   Problem List Items Addressed This Visit      Endocrine   Type 2 diabetes mellitus with other specified complication ([place])   Relevant Medications   Continuous Blood Gluc Sensor (FREESTYLE LIBRE 14 DAY SENSOR) MISC    Other Visit Diagnoses    Sleep disorder    -  Primary   Memory changes         Patient has appointment with neurology for July, I told her to see if they have cancellations and see if she can get in sooner.  This office visit was a visit to discuss patient's diabetic management and because she is out of control and using insulin 4 times daily and having to check her blood sugars 4 times daily I believe she would be a good  candidate for a continuous subcutaneous glucose monitor such as freestyle libre.   Follow up plan: Return in about 4 weeks (around 06/14/2021), or if symptoms worsen or fail to improve, for Diabetes recheck.  Counseling provided for all of the vaccine components No orders of the defined types were placed in this encounter.   Caryl Pina, MD [facility] 05/17/2021, 1:56 PM

## 2021-05-19 ENCOUNTER — Encounter: Payer: HMO | Admitting: Psychology

## 2021-06-02 ENCOUNTER — Telehealth: Payer: Self-pay | Admitting: Pharmacist

## 2021-06-02 NOTE — Telephone Encounter (Signed)
PA NOT COMPLETED DUE TO PLAN DOESN'T COVER LIBRE   PLEASE CALL PATIENT AND SEE IF SHE WOULD LIKE [name] TO TRY DEXCOM; UNSURE OF PRICE, BUT INSURANCE PREFERS  DELETED PA

## 2021-06-08 ENCOUNTER — Other Ambulatory Visit: Payer: Self-pay | Admitting: *Deleted

## 2021-06-10 NOTE — Telephone Encounter (Signed)
[facility] called stating that they received denial on PA for [facility] with stating that pt needs to try dexcom. Pharmacy spoke with pt about this and pt voiced understanding.  Please call patient to confirm this.

## 2021-06-16 ENCOUNTER — Ambulatory Visit: Payer: HMO | Admitting: Psychology

## 2021-06-16 DIAGNOSIS — E1140 Type 2 diabetes mellitus with diabetic neuropathy, unspecified: Secondary | ICD-10-CM | POA: Diagnosis not present

## 2021-06-16 DIAGNOSIS — M79671 Pain in right foot: Secondary | ICD-10-CM | POA: Diagnosis not present

## 2021-06-16 DIAGNOSIS — G894 Chronic pain syndrome: Secondary | ICD-10-CM | POA: Diagnosis not present

## 2021-06-16 DIAGNOSIS — M79672 Pain in left foot: Secondary | ICD-10-CM | POA: Diagnosis not present

## 2021-06-16 DIAGNOSIS — G2581 Restless legs syndrome: Secondary | ICD-10-CM | POA: Diagnosis not present

## 2021-06-21 ENCOUNTER — Ambulatory Visit (INDEPENDENT_AMBULATORY_CARE_PROVIDER_SITE_OTHER): Payer: HMO | Admitting: Family Medicine

## 2021-06-21 ENCOUNTER — Encounter: Payer: Self-pay | Admitting: Family Medicine

## 2021-06-21 ENCOUNTER — Other Ambulatory Visit: Payer: Self-pay

## 2021-06-21 VITALS — BP 108/70 | HR 78 | Ht 69.0 in | Wt 196.0 lb

## 2021-06-21 DIAGNOSIS — Z794 Long term (current) use of insulin: Secondary | ICD-10-CM

## 2021-06-21 DIAGNOSIS — E785 Hyperlipidemia, unspecified: Secondary | ICD-10-CM | POA: Diagnosis not present

## 2021-06-21 DIAGNOSIS — E1169 Type 2 diabetes mellitus with other specified complication: Secondary | ICD-10-CM | POA: Diagnosis not present

## 2021-06-21 DIAGNOSIS — R300 Dysuria: Secondary | ICD-10-CM

## 2021-06-21 DIAGNOSIS — E1142 Type 2 diabetes mellitus with diabetic polyneuropathy: Secondary | ICD-10-CM

## 2021-06-21 LAB — URINALYSIS
Bilirubin, UA: NEGATIVE
Glucose, UA: NEGATIVE
Nitrite, UA: NEGATIVE
Protein,UA: NEGATIVE
RBC, UA: NEGATIVE
Specific Gravity, UA: 1.025 (ref 1.005–1.030)
Urobilinogen, Ur: 0.2 mg/dL (ref 0.2–1.0)
pH, UA: 5.5 (ref 5.0–7.5)

## 2021-06-21 LAB — BAYER DCA HB A1C WAIVED: HB A1C (BAYER DCA - WAIVED): 7.2 % — ABNORMAL HIGH (ref <7.0)

## 2021-06-21 MED ORDER — DEXCOM G6 SENSOR MISC: 1.0000 | 11 refills | Status: DC

## 2021-06-21 MED ORDER — DEXCOM G6 RECEIVER DEVI: 1.0000 | Freq: Four times a day (QID) | 3 refills | Status: DC

## 2021-06-21 MED ORDER — LANTUS SOLOSTAR 100 UNIT/ML ~~LOC~~ SOPN
20.0000 [IU] | PEN_INJECTOR | Freq: Every day | SUBCUTANEOUS | 3 refills | Status: DC
Start: 2021-06-21 — End: 2021-09-23

## 2021-06-21 MED ORDER — DEXCOM G6 TRANSMITTER MISC: 1.0000 | Freq: Four times a day (QID) | 3 refills | Status: DC

## 2021-06-21 NOTE — Progress Notes (Signed)
BP 108/70   Pulse 78   Ht _0  (1.753 m)   Wt 196 lb (88.9 kg)   SpO2 95%   BMI 28.94 kg/m    Subjective:   Patient ID: Rhonda Thomas, female    DOB: July 02, 1956, 65 y.o.   MRN: [id number]  HPI: Rhonda Thomas is a 65 y.o. female presenting on 06/21/2021 for Medical Management of Chronic Issues, sleep disorder, and Urinary Tract Infection   HPI Patient is coming in complaining of dysuria and urinary frequency Urine check.  She denies any fevers chills or abdominal pain.  She does have urinary frequency very often and feels like she gets bladder infections all the time.  Type 2 diabetes mellitus Patient comes in today for recheck of his diabetes. Patient has been currently taking Basaglar and NovoLog and metformin. Patient is not currently on an ACE inhibitor/ARB. Patient has not seen an ophthalmologist this year. Patient denies any issues with their feet. The symptom started onset as an adult hyperlipidemia and neuropathy ARE RELATED TO DM   Hyperlipidemia Patient is coming in for recheck of his hyperlipidemia. The patient is currently taking Crestor. They deny any issues with myalgias or history of liver damage from it. They deny any focal numbness or weakness or chest pain.   Patient has anxiety and depression peripheral neuropathy psychiatry at least.  Relevant past medical, surgical, family and social history reviewed and updated as indicated. Interim medical history since our last visit reviewed. Allergies and medications reviewed and updated.  Review of Systems  Constitutional:  Negative for chills and fever.  Eyes:  Negative for visual disturbance.  Respiratory:  Negative for chest tightness and shortness of breath.   Cardiovascular:  Negative for chest pain and leg swelling.  Gastrointestinal:  Negative for abdominal pain.  Genitourinary:  Positive for dysuria and frequency. Negative for hematuria, pelvic pain, urgency, vaginal bleeding, vaginal discharge  and vaginal pain.  Skin:  Negative for rash.  Neurological:  Negative for dizziness, light-headedness and headaches.  Psychiatric/Behavioral:  Positive for sleep disturbance. Negative for agitation, behavioral problems, dysphoric mood, self-injury and suicidal ideas.   All other systems reviewed and are negative.  Per HPI unless specifically indicated above   Allergies as of 06/21/2021       Reactions   Actos [pioglitazone]    Edema   Invokana [canagliflozin]    vaginitis        Medication List        Accurate as of June 21, 2021  4:12 PM. If you have any questions, ask your nurse or doctor.          STOP taking these medications    cephALEXin 500 MG capsule Commonly known as: KEFLEX Stopped by: Fransisca Kaufmann Chaunte Hornbeck, MD       TAKE these medications    Aspirin Low Dose 81 MG EC tablet Generic drug: aspirin TAKE 1 TABLET BY MOUTH EVERY DAY   Basaglar KwikPen 100 UNIT/ML Inject 20-30 Units into the skin at bedtime. (NEEDS TO BE SEEN BEFORE NEXT REFILL)   Dexcom G6 Receiver Devi 1 each by Does not apply route 4 (four) times daily. Started by: Worthy Rancher, MD   Dexcom G6 Sensor Misc 1 each by Does not apply route once a week. What changed: when to take this Changed by: Worthy Rancher, MD   Dexcom G6 Transmitter Misc 1 each by Does not apply route 4 (four) times daily. Started by: Worthy Rancher, MD  DULoxetine 60 MG capsule Commonly known as: CYMBALTA Take 1 capsule (60 mg total) by mouth at bedtime.   glucose monitoring kit monitoring kit Check glucose 4 times per day for insulin dependent diabetes with complications. Fill test strips #100 and Lancets #100 with PRN refills.   Insulin Pen Needle 31G X 5 MM Misc Commonly known as: Global Ease Inject Pen Needles USE FOUR TIMES DAILY   metFORMIN 500 MG tablet Commonly known as: GLUCOPHAGE Take 1 tablet (500 mg total) by mouth 2 (two) times daily with a meal.   NovoLOG FlexPen 100  UNIT/ML FlexPen Generic drug: insulin aspart Inject 3-20 Units into the skin 3 times/day as needed-between meals & bedtime for high blood sugar.   Nucynta ER 150 MG Tb12 Generic drug: Tapentadol HCl Take 1 tablet by mouth every 12 (twelve) hours.   OXcarbazepine 150 MG tablet Commonly known as: TRILEPTAL TAKE ONE TABLET BY MOUTH EVERY MORNING AND TAKE TWO TABLETS BY MOUTH AT BEDTIME   pregabalin 150 MG capsule Commonly known as: LYRICA 1 in am 2 at night   rOPINIRole 1 MG tablet Commonly known as: Requip Take 2 tablets (2 mg total) by mouth at bedtime.   rosuvastatin 5 MG tablet Commonly known as: CRESTOR Take 1 tablet (5 mg total) by mouth at bedtime.   Trulicity 1.5 FS/1.4EL Sopn Generic drug: Dulaglutide Inject 1.5 mg into the skin once a week. inject 1.5 MG into SKIN ONCE EVERY WEEK   VITAMIN D PO Take 1,000 Units by mouth daily.         Objective:   BP 108/70   Pulse 78   Ht $R'5\' 9"'Nw$  (1.753 m)   Wt 196 lb (88.9 kg)   SpO2 95%   BMI 28.94 kg/m   Wt Readings from Last 3 Encounters:  06/21/21 196 lb (88.9 kg)  05/17/21 198 lb (89.8 kg)  10/26/20 199 lb (90.3 kg)    Physical Exam Vitals and nursing note reviewed.  Constitutional:      General: She is not in acute distress.    Appearance: She is well-developed. She is not diaphoretic.  Eyes:     Conjunctiva/sclera: Conjunctivae normal.  Cardiovascular:     Rate and Rhythm: Normal rate and regular rhythm.     Heart sounds: Normal heart sounds. No murmur heard. Pulmonary:     Effort: Pulmonary effort is normal. No respiratory distress.     Breath sounds: Normal breath sounds. No wheezing.  Abdominal:     General: Abdomen is flat. Bowel sounds are normal. There is no distension.     Tenderness: There is no abdominal tenderness. There is no right CVA tenderness or left CVA tenderness.  Musculoskeletal:        General: No tenderness. Normal range of motion.  Skin:    General: Skin is warm and dry.      Findings: No rash.  Neurological:     Mental Status: She is alert and oriented to person, place, and time.     Coordination: Coordination normal.  Psychiatric:        Behavior: Behavior normal.      Assessment & Plan:   Problem List Items Addressed This Visit       Endocrine   Type 2 diabetes mellitus with other specified complication ([place])   Relevant Orders   Bayer DCA Hb A1c Waived   CBC with Differential/Platelet   CMP14+EGFR   Diabetic peripheral neuropathy ([place])   Relevant Orders   Bayer DCA Hb A1c  Waived   CBC with Differential/Platelet   CMP14+EGFR     Other   Hyperlipidemia   Relevant Orders   CBC with Differential/Platelet   Lipid panel   Other Visit Diagnoses     Dysuria    -  Primary   Relevant Orders   Urine Culture   Urinalysis   CBC with Differential/Platelet       Will check blood work today, will do urinalysis.  Switch from [place] to Lantus because of insurance coverage, keep same dosing  Urinalysis pending, had to send out.  We will check blood work today, no change in medication right now.   This office visit was a visit to discuss patient's diabetic management and because she is using insulin 4 times daily and having to check her blood sugars 4 times daily I believe she would be a good candidate for a continuous subcutaneous glucose monitor such as Dexcom.  Follow up plan: Return in about 3 months (around 09/21/2021), or if symptoms worsen or fail to improve, for Diabetes and cholesterol.  Counseling provided for all of the vaccine components Orders Placed This Encounter  Procedures   Urine Culture   Urinalysis   Bayer DCA Hb A1c Waived   CBC with Differential/Platelet   CMP14+EGFR   Lipid panel    Caryl Pina, MD [facility] 06/21/2021, 4:12 PM

## 2021-06-22 ENCOUNTER — Telehealth: Payer: Self-pay

## 2021-06-22 LAB — CMP14+EGFR
ALT: 13 IU/L (ref 0–32)
AST: 16 IU/L (ref 0–40)
Albumin/Globulin Ratio: 1.9 (ref 1.2–2.2)
Albumin: 4 g/dL (ref 3.8–4.8)
Alkaline Phosphatase: 86 IU/L (ref 44–121)
BUN/Creatinine Ratio: 20 (ref 12–28)
BUN: 15 mg/dL (ref 8–27)
Bilirubin Total: 0.3 mg/dL (ref 0.0–1.2)
CO2: 28 mmol/L (ref 20–29)
Calcium: 9.3 mg/dL (ref 8.7–10.3)
Chloride: 104 mmol/L (ref 96–106)
Creatinine, Ser: 0.74 mg/dL (ref 0.57–1.00)
Globulin, Total: 2.1 g/dL (ref 1.5–4.5)
Glucose: 123 mg/dL — ABNORMAL HIGH (ref 65–99)
Potassium: 5.3 mmol/L — ABNORMAL HIGH (ref 3.5–5.2)
Sodium: 144 mmol/L (ref 134–144)
Total Protein: 6.1 g/dL (ref 6.0–8.5)
eGFR: 90 mL/min/{1.73_m2} (ref >59)

## 2021-06-22 LAB — CBC WITH DIFFERENTIAL/PLATELET
Basophils Absolute: 0 10*3/uL (ref 0.0–0.2)
Basos: 1 %
EOS (ABSOLUTE): 0.1 10*3/uL (ref 0.0–0.4)
Eos: 2 %
Hematocrit: 40 % (ref 34.0–46.6)
Hemoglobin: 13.4 g/dL (ref 11.1–15.9)
Immature Grans (Abs): 0 10*3/uL (ref 0.0–0.1)
Immature Granulocytes: 1 %
Lymphocytes Absolute: 1.8 10*3/uL (ref 0.7–3.1)
Lymphs: 27 %
MCH: 30 pg (ref 26.6–33.0)
MCHC: 33.5 g/dL (ref 31.5–35.7)
MCV: 90 fL (ref 79–97)
Monocytes Absolute: 0.4 10*3/uL (ref 0.1–0.9)
Monocytes: 6 %
Neutrophils Absolute: 4.2 10*3/uL (ref 1.4–7.0)
Neutrophils: 63 %
Platelets: 199 10*3/uL (ref 150–450)
RBC: 4.47 x10E6/uL (ref 3.77–5.28)
RDW: 12.7 % (ref 11.7–15.4)
WBC: 6.5 10*3/uL (ref 3.4–10.8)

## 2021-06-22 LAB — LIPID PANEL
Chol/HDL Ratio: 2.1 ratio (ref 0.0–4.4)
Cholesterol, Total: 117 mg/dL (ref 100–199)
HDL: 55 mg/dL (ref >39)
LDL Chol Calc (NIH): 33 mg/dL (ref 0–99)
Triglycerides: 177 mg/dL — ABNORMAL HIGH (ref 0–149)
VLDL Cholesterol Cal: 29 mg/dL (ref 5–40)

## 2021-06-22 NOTE — Telephone Encounter (Signed)
Dexcom approved in Covermymeds from 06/22/21-06/22/2022. [facility] made aware.

## 2021-06-22 NOTE — Telephone Encounter (Signed)
PA in process for Dexcom   Key: [id number] - PA Case ID: [id number] - Rx #: [id number]  Key: [id number] - PA Case ID: [id number] - Rx #: [id number] Approved today Your PA request has been approved. Additional information will be provided in the approval communication. (Message 1145) Drug Dexcom G6 Sensor   Pharmacy and patient aware.

## 2021-06-23 LAB — URINE CULTURE

## 2021-06-24 ENCOUNTER — Encounter: Payer: Self-pay | Admitting: Neurology

## 2021-06-24 ENCOUNTER — Ambulatory Visit (INDEPENDENT_AMBULATORY_CARE_PROVIDER_SITE_OTHER): Payer: HMO | Admitting: Neurology

## 2021-06-24 VITALS — BP 110/69 | HR 83 | Ht 70.0 in | Wt 198.0 lb

## 2021-06-24 DIAGNOSIS — R400 Somnolence: Secondary | ICD-10-CM | POA: Diagnosis not present

## 2021-06-24 DIAGNOSIS — E1142 Type 2 diabetes mellitus with diabetic polyneuropathy: Secondary | ICD-10-CM | POA: Diagnosis not present

## 2021-06-24 DIAGNOSIS — G2581 Restless legs syndrome: Secondary | ICD-10-CM

## 2021-06-24 MED ORDER — ROPINIROLE HCL 1 MG PO TABS: 1.0000 mg | ORAL_TABLET | Freq: Every day | ORAL | 1 refills | Status: DC

## 2021-06-24 MED ORDER — OXCARBAZEPINE 150 MG PO TABS: ORAL_TABLET | ORAL | 4 refills | Status: DC

## 2021-06-24 NOTE — Patient Instructions (Signed)
Will continue current medications Try to get back on normal sleep schedule Referral to sleep consult  See you back in 8 months

## 2021-06-24 NOTE — Progress Notes (Signed)
Chief Complaint  Patient presents with   Follow-up    Rm 12 alone Pt is well, she has had a few falls recently and becomes very fatigue.    HISTORICAL  Rhonda Thomas is a 65 year old female, seen in request by her primary care physician Dr. Warrick Parisian, Vonna Kotyk and pain management Dr. Debarah Crape for evaluation of worsening bilateral lower extremity burning pain, spasm, evaluation was on October 13, 2020  I reviewed and summarized the referring note.  Past medical history Diabetes, insulin-dependent Depression ADHD Hyperlipidemia Restless leg syndrome.  I saw her previously in 2013 for similar complaints, she reported long history of diabetes, often under suboptimal control, developed diabetic peripheral neuropathy, starting to complains of bilateral feet paresthesia involving plantar surface and toes at early 2000, progressively getting worse,  Electrodiagnostic study in 2013 showed evidence of mild to moderate length dependent axonal sensory motor polyneuropathy There was also evidence of mild to moderate bilateral carpal tunnel syndromes  She also had right second toe amputation due to infection, frequent recurrent toe infection  She developed restless leg symptoms, bilateral lower extremity neuropathic pain, was treated with Lyrica, also sentiment, reported some improvement, she lost follow-up since last visit in September 2013  Over the years, her bilateral lower extremity neuropathy continue to getting worse, no numbness is still below knee level, intermittent bilateral hands paresthesia, she went on medical disability few years ago, complains of irregular sleep pattern, she tends to go to bed around 1-2 AM, sleeps in late,  She is on polypharmacy treatment, this including Trileptal 150 mg twice a day, Lyrica 150 mg twice a day, Sinemet 25/100 mg twice a day, she has hard time with her neck, she also complains of burning pain, muscle cramping at her food, especially along  left lateral foot, extending into her legs.  She complains of frequent leg jerking, difficulty falling to sleep, she took her nighttime medication at 6 PM, did not go to sleep until 1 AM 6 to 7 hours later, complains of difficulty sleeping, moving her neck so much, that took a hole in her bed sheets, poor sleep quality, frequent awakening,  Laboratory evaluation in October 2021: Normal B12 408, lipid panel LDL 41, triglyceride 266, CMP Albumin 3.5, and normal CBC hemoglobin of 11.9, A1C 7.1  Update June 24, 2021 SS: Here today alone, still taking Sinemet 25/100 mg 2 times daily, Trileptal 150 mg twice daily, Lyrica 150 mg twice daily, Requip 1 mg at bedtime. When taking Lyrica at prescribed dosing 150/300 gets spasms in legs. Has spinal stimulator now, there at times her feet so she turns it off. Claims tried to come off medications and symptoms got worse.   In November 2021 extensive laboratory evaluation, ferritin, ANA, copper, MM panel, CRP, sed rate were unremarkable. Vitamin D was low 17.4  Wants to discuss sleep study, can go to bathroom at night, go to sleep on toilet and fall off, does antique repair, works late at night, often sleeps during the day. Falling asleep ironing burned her head. Previously had sleep consult with Dr. Rexene Alberts in 2016, never used CPAP, moderate OSA. ESS 19.  REVIEW OF SYSTEMS: Full 14 system review of systems performed and notable only for as above  See HPI  ALLERGIES: Allergies  Allergen Reactions   Actos [Pioglitazone]     Edema    Invokana [Canagliflozin]     vaginitis    HOME MEDICATIONS: Current Outpatient Medications  Medication Sig Dispense Refill   ASPIRIN LOW DOSE  81 MG EC tablet TAKE 1 TABLET BY MOUTH EVERY DAY 90 tablet 0   Continuous Blood Gluc Receiver (DEXCOM G6 RECEIVER) DEVI 1 each by Does not apply route 4 (four) times daily. 1 each 3   Continuous Blood Gluc Sensor (DEXCOM G6 SENSOR) MISC 1 each by Does not apply route once a week. 4 each  11   Continuous Blood Gluc Transmit (DEXCOM G6 TRANSMITTER) MISC 1 each by Does not apply route 4 (four) times daily. 1 each 3   Dulaglutide (TRULICITY) 1.5 KV/4.2VZ SOPN Inject 1.5 mg into the skin once a week. inject 1.5 MG into SKIN ONCE EVERY WEEK 3 mL 3   DULoxetine (CYMBALTA) 60 MG capsule Take 1 capsule (60 mg total) by mouth at bedtime. 90 capsule 3   insulin aspart (NOVOLOG FLEXPEN) 100 UNIT/ML FlexPen Inject 3-20 Units into the skin 3 times/day as needed-between meals & bedtime for high blood sugar. 30 mL 11   insulin glargine (LANTUS SOLOSTAR) 100 UNIT/ML Solostar Pen Inject 20-30 Units into the skin daily. 25 mL 3   Insulin Pen Needle (GLOBAL EASE INJECT PEN NEEDLES) 31G X 5 MM MISC USE FOUR TIMES DAILY 100 each 9   metFORMIN (GLUCOPHAGE) 500 MG tablet Take 1 tablet (500 mg total) by mouth 2 (two) times daily with a meal. 180 tablet 3   NUCYNTA ER 150 MG TB12 Take 1 tablet by mouth every 12 (twelve) hours.     OXcarbazepine (TRILEPTAL) 150 MG tablet TAKE ONE TABLET BY MOUTH EVERY MORNING AND TAKE TWO TABLETS BY MOUTH AT BEDTIME 270 tablet 4   pregabalin (LYRICA) 150 MG capsule 1 in am 2 at night 270 capsule 0   rOPINIRole (REQUIP) 1 MG tablet Take 2 tablets (2 mg total) by mouth at bedtime. 60 tablet 6   rosuvastatin (CRESTOR) 5 MG tablet Take 1 tablet (5 mg total) by mouth at bedtime. 90 tablet 3   VITAMIN D PO Take 1,000 Units by mouth daily.     No current facility-administered medications for this visit.    PAST MEDICAL HISTORY: Past Medical History:  Diagnosis Date   Anxiety    Attention deficit disorder (ADD)    Depression    Diabetes ([place])    Hiatal hernia    Movement disorder    Neuropathy    Schatzki's ring    Sleep apnea     PAST SURGICAL HISTORY: Past Surgical History:  Procedure Laterality Date   ABDOMINAL HYSTERECTOMY     BREAST LUMPECTOMY WITH RADIOACTIVE SEED LOCALIZATION Right 04/25/2016   Procedure: RIGHT BREAST LUMPECTOMY WITH RADIOACTIVE SEED  LOCALIZATION;  Surgeon: Autumn Messing III, MD;  Location: [facility];  Service: General;  Laterality: Right;   CHOLECYSTECTOMY     RECTOCELE REPAIR     SPINAL CORD STIMULATOR IMPLANT     TOE AMPUTATION Right    2nd toe rt foot   TUBAL LIGATION      FAMILY HISTORY: Family History  Problem Relation Age of Onset   Stroke Mother    Diabetes Father    Heart disease Father        pacemaker   Diabetes Other    Heart Problems Other    Colon cancer Neg Hx     SOCIAL HISTORY: Social History   Socioeconomic History   Marital status: Married    Spouse name: [name]   Number of children: 3   Years of education: college   Highest education level: Not on file  Occupational History  Occupation: Retired    Fish farm manager: [employer]    Comment: [place]  Tobacco Use   Smoking status: Never   Smokeless tobacco: Never  Vaping Use   Vaping Use: Never used  Substance and Sexual Activity   Alcohol use: No    Alcohol/week: 0.0 standard drinks   Drug use: Never   Sexual activity: Not on file  Other Topics Concern   Not on file  Social History Narrative   Patient lives at home with her husband. [name]).   Education- College   Right handed.   Caffeine- tea one cup daily.      Social Determinants of Health   Financial Resource Strain: Not on file  Food Insecurity: Not on file  Transportation Needs: Not on file  Physical Activity: Not on file  Stress: Not on file  Social Connections: Not on file  Intimate Partner Violence: Not on file   PHYSICAL EXAM   Vitals:   06/24/21 1336  BP: 110/69  Pulse: 83  Weight: 198 lb (89.8 kg)  Height: 5\' 10"  (1.778 m)    Not recorded    Body mass index is 28.41 kg/m.  Physical Exam  General: The patient is alert and cooperative at the time of the examination. Flat affect. Soft spoken.  Skin: No significant peripheral edema is noted.  Neurologic Exam  Mental status: The patient is alert and  oriented x 3 at the time of the examination. The patient has apparent normal recent and remote memory, with an apparently normal attention span and concentration ability.  Cranial nerves: Facial symmetry is present. Speech is normal, no aphasia or dysarthria is noted. Extraocular movements are full. Visual fields are full.  Motor: The patient has good strength in all 4 extremities.  Sensory examination: Soft touch sensation is symmetric on the face, arms, and legs.  Coordination: The patient has good finger-nose-finger and heel-to-shin bilaterally.  Gait and station: The patient has a normal gait.   Reflexes: Deep tendon reflexes are symmetric.  DIAGNOSTIC DATA (LABS, IMAGING, TESTING) - I reviewed patient records, labs, notes, testing and imaging myself where available.   ASSESSMENT AND PLAN  Chancey Cullinane is a 65 y.o. female   Peripheral neuropathy 2.   Restless leg symptoms -Symptoms chronic under good control, has spinal stimulator, sees pain management, on Nucynta -Will adjust medications prescribed by our office, is actually taking less than prescription indicates, adverse effect with higher dose, continue:  -Requip 1 mg at bedtime (was 2 mg)  -Lyrica 150 mg twice a day (was 150/300)  Trileptal 150 mg twice a day (was 150/300)  -Cymbalta 60 mg daily  -Never stopped Sinemet, claims comes from PCP, Dr. Krista Blue recommended stopping to avoid augmentation - In November 2021 extensive laboratory evaluation, ferritin, ANA, copper, MM panel, CRP, sed rate were unremarkable. Vitamin D was low 17.4, on supplement   3.  Somnolence, daytime, history of OSA -Previous sleep consultation with Dr. Rexene Alberts, showed moderate OSA, claims never started CPAP back in 2016 -More symptoms now, easily falling asleep -Referral for sleep consult, mentions interest in inspire, ESS score was 19, other factors are her polypharmacy -Did discuss, 1st thing need to get on normal sleep schedule, not stay up  during the night working -Will follow-up in our office in 8 months, but follow-up may be pushed up depending on results of sleep consultation  I spent 33 minutes of face-to-face and non-face-to-face time with patient.  This included previsit chart review, lab review,  study review, order entry, discussing sleep habits, ESS, medications, management, and follow-up.  Evangeline Dakin, DNP  [facility] [address], [place], [place] [address] [phone]

## 2021-07-01 ENCOUNTER — Other Ambulatory Visit: Payer: Self-pay

## 2021-07-01 ENCOUNTER — Encounter: Payer: HMO | Attending: Psychology | Admitting: Psychology

## 2021-07-01 DIAGNOSIS — E1142 Type 2 diabetes mellitus with diabetic polyneuropathy: Secondary | ICD-10-CM | POA: Diagnosis not present

## 2021-07-01 DIAGNOSIS — F331 Major depressive disorder, recurrent, moderate: Secondary | ICD-10-CM

## 2021-07-01 DIAGNOSIS — F411 Generalized anxiety disorder: Secondary | ICD-10-CM | POA: Diagnosis not present

## 2021-07-04 NOTE — Progress Notes (Signed)
Patient ID: Rhonda Thomas, female   DOB: [birth year], 65 y.o.   MRN: [id number] [name]   DOB: 11/02/1956  MR Number: [id number]  Location: [facility] [place] [facility] [place], [place] [id number] [place] [address] Dept: [phone]  Start: 11 AM End: 12 PM  Today's visit was 1 hour in person visit that was conducted in my outpatient clinic office with the patient myself present.  Provider/Observer:     Edgardo Roys PsyD  Chief Complaint:      No chief complaint on file.   Reason For Service:     I have worked with the patient for several years now. She was initially referred here for difficulty coping with the situation involving her children. Her youngest son is at severe difficulties over the years with regard to mood disorder, substance abuse and behavioral problems. Now there are major stressors associated with him again the patient has been essentially overwhelmed and unsure about what to do about. She reports that this has created a lot of stress both at home and at work. On top of that, her diabetes has gotten to the point that she had hammertoe amputated because of that. She has neuropathy as a result of diabetes as well and has had to stay out of her foot for 8 weeks.  The psychosocial stressors are having significant negative impact upon her severe diabetes with neuropathy and loss of toe.  The patient continues to have recurrent issues with residual effects of her type 2 diabetes with significant medical complications.  The reason for service has been reviewed and remains applicable for the current visit.  The patient reports that she has had a difficult time over the past week or so.  She reports that she hit her medications as her granddaughter was coming over for visit and has been unable to find her medications.  She was able to get a refill for her  pain medications as they were due for refill anyway.  She will not be able to refill her Cymbalta or Neurontin until 2 days from now.  She still has a couple of places that she may look better very unlikely places for her to have hit her medications.  Her son that had a history of taking medications from her does not live in [place] anymore and this does not appear to have been an issue.   Interventions Strategy:  Cognitive/behavioral therapeutic interventions and working on issues related to stress and her brittle diabetes/pain issues.  Participation Level:   Active  Participation Quality:  Appropriate      Behavioral Observation:  Well Groomed, Alert, and Appropriate.   Current Psychosocial Factors:   The patient reports that some of the stressors associated with her daughter's wedding have resolved but she is concerned about her oldest son's difficulties after he moved to [place] to take a teaching position.  This was a very stressful mood for her son and he has been able to return as a substitute teacher some.  Content of Session:   Reviewed current symptoms and continue to work on coping skills in dealing with both her medical issues as well as the stressors.  Current Status:   The patient reports that she has been able to finish her spinal cord stimulator surgery and reports that she is experiencing a 50-60% improvement in her pain levels.     Last Reviewed:  07/01/2021  Goals Addressed Today:    Goals addressed included building better coping skills.  Impression/Diagnosis:   The patient has a long history of attention deficit disorder but due to Maj. psychosocial stressors she also developed clinical depression and anxiety. I think that her attentional problems were valid prior to the development of these issues that existed her entire life. However, she is in and repeat if and recurrent trauma with regard primarily to her son and to her daughter to a lesser degree in years  past.     Edgardo Roys, PsyD 07/04/2021

## 2021-07-21 ENCOUNTER — Ambulatory Visit: Payer: HMO | Admitting: Psychology

## 2021-07-27 ENCOUNTER — Other Ambulatory Visit: Payer: Self-pay

## 2021-07-27 ENCOUNTER — Encounter: Payer: HMO | Attending: Psychology | Admitting: Psychology

## 2021-07-27 DIAGNOSIS — E1142 Type 2 diabetes mellitus with diabetic polyneuropathy: Secondary | ICD-10-CM

## 2021-07-27 DIAGNOSIS — F331 Major depressive disorder, recurrent, moderate: Secondary | ICD-10-CM

## 2021-07-27 DIAGNOSIS — F411 Generalized anxiety disorder: Secondary | ICD-10-CM | POA: Diagnosis not present

## 2021-08-02 ENCOUNTER — Other Ambulatory Visit: Payer: Self-pay | Admitting: Family Medicine

## 2021-08-02 DIAGNOSIS — E1049 Type 1 diabetes mellitus with other diabetic neurological complication: Secondary | ICD-10-CM

## 2021-08-11 DIAGNOSIS — E1140 Type 2 diabetes mellitus with diabetic neuropathy, unspecified: Secondary | ICD-10-CM | POA: Diagnosis not present

## 2021-08-11 DIAGNOSIS — G2581 Restless legs syndrome: Secondary | ICD-10-CM | POA: Diagnosis not present

## 2021-08-11 DIAGNOSIS — M79672 Pain in left foot: Secondary | ICD-10-CM | POA: Diagnosis not present

## 2021-08-11 DIAGNOSIS — M79671 Pain in right foot: Secondary | ICD-10-CM | POA: Diagnosis not present

## 2021-08-11 DIAGNOSIS — G894 Chronic pain syndrome: Secondary | ICD-10-CM | POA: Diagnosis not present

## 2021-08-25 ENCOUNTER — Ambulatory Visit: Payer: HMO | Admitting: Psychology

## 2021-08-30 ENCOUNTER — Encounter: Payer: HMO | Attending: Psychology | Admitting: Psychology

## 2021-08-30 ENCOUNTER — Other Ambulatory Visit: Payer: Self-pay

## 2021-08-30 ENCOUNTER — Encounter: Payer: Self-pay | Admitting: Psychology

## 2021-08-30 DIAGNOSIS — E1142 Type 2 diabetes mellitus with diabetic polyneuropathy: Secondary | ICD-10-CM | POA: Diagnosis not present

## 2021-08-30 DIAGNOSIS — M5137 Other intervertebral disc degeneration, lumbosacral region: Secondary | ICD-10-CM | POA: Diagnosis not present

## 2021-08-30 DIAGNOSIS — F331 Major depressive disorder, recurrent, moderate: Secondary | ICD-10-CM

## 2021-08-30 DIAGNOSIS — F411 Generalized anxiety disorder: Secondary | ICD-10-CM | POA: Diagnosis not present

## 2021-08-30 NOTE — Progress Notes (Signed)
Patient ID: Rhonda Thomas, female   DOB: 1955-12-29, 65 y.o.   MRN: [id number] [name]   DOB: [birth year]  MR Number: [id number]  Location: [facility] [place] [facility] [place], [place] [id number] [place] [id number] Dept: [phone]  Start: 1 PM End: 2 PM  Today's visit was an in person visit that lasted for 1 hour and was conducted in my outpatient clinic office with the patient myself present.  Provider/Observer:     Edgardo Roys PsyD  Chief Complaint:      Chief Complaint  Patient presents with   Anxiety   Depression   Stress     Reason For Service:     I have worked with the patient for several years now. She was initially referred here for difficulty coping with the situation involving her children. Her youngest son is at severe difficulties over the years with regard to mood disorder, substance abuse and behavioral problems. Now there are major stressors associated with him again the patient has been essentially overwhelmed and unsure about what to do about. She reports that this has created a lot of stress both at home and at work. On top of that, her diabetes has gotten to the point that she had hammertoe amputated because of that. She has neuropathy as a result of diabetes as well and has had to stay out of her foot for 8 weeks.  The psychosocial stressors are having significant negative impact upon her severe diabetes with neuropathy and loss of toe.  The patient continues to have recurrent issues with residual effects of her type 2 diabetes with significant medical complications.  The reason for service has been reviewed and remains applicable for the current visit.  The patient reports that she is still dealing with anxiety and depressive type symptomatology and continues to take her psychotropic medications including Cymbalta, Trileptal and also  taking Lyrica for peripheral neuropathy.   Interventions Strategy:  Cognitive/behavioral therapeutic interventions and working on issues related to stress and her brittle diabetes/pain issues.  Participation Level:   Active  Participation Quality:  Appropriate      Behavioral Observation:  Well Groomed, Alert, and Appropriate.   Current Psychosocial Factors:   T the patient reports that the stressors associated with her son have continued to some degree and he is constantly in financial distress and having difficulty making ends meet without the assistance of her son's new wife working.  The son is working third shift currently and will call the patient in the middle of the night to talk about his stressors which make it hard for the patient to have a good night sleep.  The patient reports that her peripheral neuropathy also causes stress and difficulty.  She is doing well managing her diabetes and has had no major wounds on her feet recently.  Content of Session:   Reviewed current symptoms and continue to work on coping skills in dealing with both her medical issues as well as the stressors.  Current Status:   The patient reports that she has been able to finish her spinal cord stimulator surgery and reports that she is experiencing a 50-60% improvement in her pain levels.     Last Reviewed:   08/30/2021  Goals Addressed Today:    Goals addressed included building better coping skills.  Impression/Diagnosis:   The patient has a long history of attention deficit disorder but  due to Maj. psychosocial stressors she also developed clinical depression and anxiety. I think that her attentional problems were valid prior to the development of these issues that existed her entire life. However, she is in and repeat if and recurrent trauma with regard primarily to her son and to her daughter to a lesser degree in years past.     Edgardo Roys, PsyD 08/30/2021

## 2021-08-31 ENCOUNTER — Encounter: Payer: Self-pay | Admitting: Neurology

## 2021-08-31 ENCOUNTER — Ambulatory Visit (INDEPENDENT_AMBULATORY_CARE_PROVIDER_SITE_OTHER): Payer: HMO | Admitting: Neurology

## 2021-08-31 VITALS — BP 131/75 | HR 90 | Ht 69.0 in | Wt 197.8 lb

## 2021-08-31 DIAGNOSIS — R351 Nocturia: Secondary | ICD-10-CM | POA: Diagnosis not present

## 2021-08-31 DIAGNOSIS — E663 Overweight: Secondary | ICD-10-CM

## 2021-08-31 DIAGNOSIS — Z789 Other specified health status: Secondary | ICD-10-CM

## 2021-08-31 DIAGNOSIS — G4719 Other hypersomnia: Secondary | ICD-10-CM | POA: Diagnosis not present

## 2021-08-31 DIAGNOSIS — G4733 Obstructive sleep apnea (adult) (pediatric): Secondary | ICD-10-CM

## 2021-08-31 NOTE — Progress Notes (Signed)
Subjective:    Patient ID: Rhonda Thomas is a 65 y.o. female.  HPI    Star Age, MD, PhD [facility] [address], [address], [place] [address]  Dear Rhonda Thomas and Rhonda Thomas,   I saw your patient, Rhonda Thomas, upon your kind request, in my Sleep clinic today for consultation of her obstructive sleep apnea, for reevaluation.  The patient is unaccompanied today.  As you know, [name] is a 65 year old right-handed woman with an underlying medical history of restless leg syndrome, neuropathy, chronic pain with spinal cord stimulator, ADD, anxiety, depression, diabetes, hiatal hernia, and overweight state, who was diagnosed with moderate obstructive sleep apnea in 2016.  She had a split-night sleep study in May 2016 and subsequently start CPAP therapy.  I evaluated her in August 2016.  She was not fully compliant with her CPAP machine at the time.  She did not return for follow-up.   She reports worsening snoring and daytime somnolence.  I reviewed your office note from 06/24/2021.  Her Epworth sleepiness score is 18 out of 24, fatigue severity score is 43 out of 63.  She reports that she could not tolerate CPAP.  She felt that she was fighting the machine and holding her breath.  She also reports that she damaged her eardrum.  She saw ENT or an audiologist for hearing loss and tinnitus.  She does not have any hearing aids.  She is currently no longer on ropinirole as it caused her severe sleepiness.  She was on 1 mg at bedtime.  Of note, she is on several potentially sedating medications including Lyrica 150 mg twice daily, Cymbalta 60 mg daily, Trileptal 150 mg twice daily.  She is also on Nucynta 150 mg twice daily.  She reports a bedtime of midnight and rise time between 9 and 10 AM.  She has nocturia about twice per average night and denies recurrent morning headaches.  She drinks no daily caffeine and does not drink any alcohol, she is a non-smoker.  She  works from home.  She has fallen asleep when she does not mean to, including on the toilet commode.  She would be willing to get reevaluated with her sleep apnea with an updated sleep study.  She has looked into inspire.  She would be amenable to exploring alternative options to CPAP therapy.  Previously:   08/03/15: 65 year old right-handed woman with an underlying medical history of restless leg syndrome, diabetes, anxiety, neuropathy, ADD and overweight state, who was referred for an attended sleep study due to a report of excessive daytime somnolence and snoring. She had a split-night sleep study on 04/21/2015 and I went over her test results with her in detail today.  Her baseline sleep efficiency was 94.5% with a latency to sleep of 6 minutes and wake after sleep onset of 3 minutes. She had absence of REM sleep prior to CPAP initiation. She had mild PLMS with minimal arousals. She had no significant EKG or EEG changes. She had mild snoring. She had 51 obstructive hypopneas. Her total AHI was 19.6 per hour. Average oxygen saturation was 90%, nadir was 81%. Time below 88% saturation was 24 minutes. She was then titrated on CPAP. Sleep efficiency was 96.5%. Sleep latency 0.5 minutes and wake after sleep onset 7 minutes. She had a normal arousal index. She had an increased percentage of stage II sleep, absence of slow-wave sleep, and REM sleep at 16.3%. Average oxygen saturation was improved at 92%,  nadir was 86% and time below 88% saturation only 31 seconds. She had mild PLMS with very mild arousals. CPAP was titrated from a pressure of 5 cm to 10 cm with the AHI reduced to 0 per hour at a pressure of 9 cm with supine REM sleep achieved on that pressure. Post study, the patient indicated that she slept better than usual. Based on her test results I prescribed CPAP therapy for home use.   I reviewed her CPAP compliance data from 06/29/2015 through 07/28/2015 which is a total of 30 days during which time she  used her machine only 7 days with percent used days greater than 4 hours at only 20%, indicating poor compliance with an average usage for days on machine at 4 hours and 57 minutes, residual AHI at 1.3 per hour, leak low with the 95th percentile at 6.2 L/m on a pressure of 9 cm with EPR of 3.    She reports that for the past few weeks she has not been feeling well. She has seen her primary care physician or one of his colleagues twice recently. She has been on treatment for a maxillary sinusitis and upper respiratory infection. She was first treated with azithromycin, then on 07/31/2015 she received stereotactic injection and a prescription for Augmentin twice daily for 10 days. She still on it. She has ongoing issues with congestion and sore throat and raspy voice. She feels that the CPAP has been difficult to tolerate for this reason. Also when she puts it on it is hard for her to breathe as it ramps up. The ramp time is 20 minutes and she feels that 9 cm is tolerable to breathe against but as it ramps up she finds that she does not get enough air. She has not reduced her ramp time and she was not aware how to do this. She has not been in touch with her DME company for this. She received her supplies from [employer]. She denies any recent fevers. She has never been a smoker. She has no history of asthma or recurrent pneumonias. Bedtime is around midnight. Rise time is around 8. 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 She has some restless leg symptoms and ongoing issues with neuropathy.  Her Past Medical History Is Significant For: Past Medical History:  Diagnosis Date   Anxiety    Attention deficit disorder (ADD)    Depression    Diabetes ([place])    Hiatal hernia    Movement disorder    Neuropathy    Schatzki's ring    Sleep apnea     Her Past Surgical History Is Significant For: Past Surgical History:  Procedure Laterality Date   ABDOMINAL  HYSTERECTOMY     BREAST LUMPECTOMY WITH RADIOACTIVE SEED LOCALIZATION Right 04/25/2016   Procedure: RIGHT BREAST LUMPECTOMY WITH RADIOACTIVE SEED LOCALIZATION;  Surgeon: Autumn Messing III, MD;  Location: [place];  Service: General;  Laterality: Right;   CHOLECYSTECTOMY     RECTOCELE REPAIR     SPINAL CORD STIMULATOR IMPLANT     TOE AMPUTATION Right    2nd toe rt foot   TUBAL LIGATION      Her Family History Is Significant For: Family History  Problem Relation Age of Onset   Stroke Mother    Sleep apnea Mother    Diabetes Father    Heart disease Father        pacemaker   Sleep  apnea Father    Diabetes Other    Heart Problems Other    Colon cancer Neg Hx     Her Social History Is Significant For: Social History   Socioeconomic History   Marital status: Married    Spouse name: [name]   Number of children: 3   Years of education: college   Highest education level: Not on file  Occupational History   Occupation: Retired    Fish farm manager: [employer]    Comment: [employer]  Tobacco Use   Smoking status: Never   Smokeless tobacco: Never  Scientific laboratory technician Use: Never used  Substance and Sexual Activity   Alcohol use: No    Alcohol/week: 0.0 standard drinks   Drug use: Never   Sexual activity: Not on file  Other Topics Concern   Not on file  Social History Narrative   Patient lives at home with her husband. [name]).   Education- College   Right handed.   Caffeine- tea one cup daily.      Social Determinants of Health   Financial Resource Strain: Not on file  Food Insecurity: Not on file  Transportation Needs: Not on file  Physical Activity: Not on file  Stress: Not on file  Social Connections: Not on file    Her Allergies Are:  Allergies  Allergen Reactions   Actos [Pioglitazone]     Edema    Invokana [Canagliflozin]     vaginitis  :   Her Current Medications Are:  Outpatient Encounter Medications as of 08/31/2021   Medication Sig   ASPIRIN LOW DOSE 81 MG EC tablet TAKE 1 TABLET BY MOUTH EVERY DAY   Continuous Blood Gluc Receiver (DEXCOM G6 RECEIVER) [place] 1 each by Does not apply route 4 (four) times daily.   Continuous Blood Gluc Sensor (DEXCOM G6 SENSOR) MISC 1 each by Does not apply route once a week.   Continuous Blood Gluc Transmit (DEXCOM G6 TRANSMITTER) MISC 1 each by Does not apply route 4 (four) times daily.   Dulaglutide (TRULICITY) 1.5 CB/4.4HQ SOPN Inject 1.5 mg into the skin once a week. inject 1.5 MG into SKIN ONCE EVERY WEEK   DULoxetine (CYMBALTA) 60 MG capsule Take 1 capsule (60 mg total) by mouth at bedtime.   insulin aspart (NOVOLOG FLEXPEN) 100 UNIT/ML FlexPen Inject 3-20 Units into the skin 3 times/day as needed-between meals & bedtime for high blood sugar.   insulin glargine (LANTUS SOLOSTAR) 100 UNIT/ML Solostar Pen Inject 20-30 Units into the skin daily.   Insulin Pen Needle (GLOBAL EASE INJECT PEN NEEDLES) 31G X 5 MM MISC USE FOUR TIMES DAILY   metFORMIN (GLUCOPHAGE) 500 MG tablet Take 1 tablet (500 mg total) by mouth 2 (two) times daily with a meal.   NUCYNTA ER 150 MG TB12 Take 1 tablet by mouth every 12 (twelve) hours.   OXcarbazepine (TRILEPTAL) 150 MG tablet Take 1 twice daily   pregabalin (LYRICA) 150 MG capsule 1 in am 2 at night   rosuvastatin (CRESTOR) 5 MG tablet TAKE 1 TABLET BY MOUTH AT BEDTIME   VITAMIN D PO Take 1,000 Units by mouth daily.   rOPINIRole (REQUIP) 1 MG tablet Take 1 tablet (1 mg total) by mouth at bedtime.   No facility-administered encounter medications on file as of 08/31/2021.  :   Review of Systems:  Out of a complete 14 point review of systems, all are reviewed and negative with the exception of these symptoms as listed below:  Review of Systems  Neurological:        Pt is here for sleep consult . Pt states that she had a sleep study done 5 years ago and she does have a CPAP but cant use it . Pt states she has had ear damaged done on her left  ear . Pt states because of CPAP . Pt states she does snore and at timesshe states she could not catch her breath. Pt states she falls asleep on the toilet at night. Pt states that she fell out the bed the other night . Pt states she was ironing and fell asleep and burned her face   FSS:43 ESS:18   Objective:  Neurological Exam  Physical Exam Physical Examination:   Vitals:   08/31/21 1318  BP: 131/75  Pulse: 90    General Examination: The patient is a very pleasant 65 y.o. female in no acute distress. [name] and well groomed.   HEENT: Normocephalic, atraumatic, pupils are equal, round and reactive to light, extraocular tracking is well-preserved, hearing is grossly intact, face is symmetric with normal facial animation.  Speech is clear without dysarthria or hypophonia.  Neck is supple, no carotid bruits.  Airway examination reveals mild to moderate mouth dryness, adequate dental hygiene, mild airway crowding secondary to small airway entry.  Mallampati class II, neck circumference of 16 1/4 inches.  Tongue protrudes centrally and palate elevates symmetrically.  She has a mild overbite.    Chest: Clear to auscultation without wheezing, rhonchi or crackles noted.   Heart: S1+S2+0, regular and normal without murmurs, rubs or gallops noted.    Abdomen: Soft, non-tender and non-distended.   Extremities: There is no obvious edema in the distal lower extremities bilaterally.   Skin: Warm and dry without trophic changes noted.    Musculoskeletal: exam reveals no obvious joint deformities.    Neurologically:  Mental status: The patient is awake, alert and oriented in all 4 spheres. Her immediate and remote memory, attention, language skills and fund of knowledge are appropriate. There is no evidence of aphasia, agnosia, apraxia or anomia. Speech is clear with normal prosody and enunciation. Thought process is linear. Mood is normal and affect is  normal.  Cranial nerves II - XII are as described above under HEENT exam.  Motor exam: Normal bulk, strength and tone is noted. There is no obvious tremor.  Fine motor skills and coordination: Grossly intact.  Cerebellar testing: No dysmetria or intention tremor. There is no truncal or gait ataxia.  Gait, station and balance: She stands without difficulty.  Posture is age-appropriate.  She walks without a limp, no walking aid.     Assessment and Plan:    In summary, Presli Fanguy is a very pleasant 65 y.o.-year old female with an underlying medical history of restless leg syndrome, neuropathy, chronic pain with spinal cord stimulator, ADD, anxiety, depression, diabetes, hiatal hernia, and overweight state, who presents for reevaluation of her obstructive sleep apnea.  She was diagnosed with moderate obstructive sleep apnea via split-night sleep study on 04/21/1999 [place], baseline AHI was 19.6/h, O2 nadir 81%.  She was not able to use CPAP therapy and reports inability to tolerate it from the get go.  She is interested in reevaluation and pursuing other treatment options.  She has significant daytime somnolence which may in part be from underlying obstructive sleep apnea, in addition, her sleepiness is likely also related to certain medications she is taking.  I explained the risks and  ramifications of untreated moderate to severe OSA, especially with respect to developing cardiovascular disease down the Road, including congestive heart failure, difficult to treat hypertension, cardiac arrhythmias, or stroke. Even type 2 diabetes has, in part, been linked to untreated OSA. Symptoms of untreated OSA include daytime sleepiness, memory problems, mood irritability and mood disorder such as depression and anxiety, lack of energy, as well as recurrent headaches, especially morning headaches. We talked about trying to maintain a healthy lifestyle in general, as well as the importance of weight control. We talked  about alternative treatment options.  She may be a candidate for dental device and we can consider a referral to dentistry.  We can also consider a referral to ENT for consideration of surgical options including inspire, hypoglossal nerve stimulator.  We will pick up our discussion after testing.  We will keep her posted as to her test results by phone call or MyChart messaging.  I answered all her questions today and she was in agreement.  Thank you very much for allowing me to participate in the care of this nice patient. If I can be of any further assistance to you please do not hesitate to talk to me.    Sincerely,     Star Age, MD, PhD

## 2021-08-31 NOTE — Patient Instructions (Signed)
  Based on your symptoms and your exam I believe you are still at risk for obstructive sleep apnea and would benefit from reevaluation as it has been many years. Therefore, I think we should proceed with a sleep study to determine how severe your sleep apnea is.  We can possibly consider alternative treatment options to your CPAP as you have not been able to use CPAP.  We can consider a referral to ENT for surgical options and we can consider a referral to dentistry to consider a dental device.    Please remember, the risks and ramifications of moderate to severe obstructive sleep apnea or OSA are: Cardiovascular disease, including congestive heart failure, stroke, difficult to control hypertension, arrhythmias, and even type 2 diabetes has been linked to untreated OSA. Sleep apnea causes disruption of sleep and sleep deprivation in most cases, which, in turn, can cause recurrent headaches, problems with memory, mood, concentration, focus, and vigilance. Most people with untreated sleep apnea report excessive daytime sleepiness, which can affect their ability to drive. Please do not drive if you feel sleepy.   I will likely see you back after your sleep study to go over the test results and where to go from there. We will call you after your sleep study to advise about the results (most likely, you will hear from [place], my nurse) and to set up an appointment at the time, as necessary.    Our sleep lab administrative assistant will call you to schedule your sleep study. If you don't hear back from her by about 2 weeks from now, please feel free to call her at [phone]. You can leave a message with your phone number and concerns, if you get the voicemail box. She will call back as soon as possible.

## 2021-09-05 ENCOUNTER — Encounter: Payer: Self-pay | Admitting: Psychology

## 2021-09-05 NOTE — Progress Notes (Signed)
Patient ID: Rhonda Thomas, female   DOB: 1956/09/11, 65 y.o.   MRN: [id number] [name]   DOB: [birth year]  MR Number: [id number]  Location: [facility] [place], [place] [id number] [place] [id number] Dept: [phone]  Start: 9 AM End: 10 AM  Today's visit was 1 hour in person visit that was conducted in my outpatient clinic office with the patient myself present.  Provider/Observer:     Edgardo Roys PsyD  Chief Complaint:      Chief Complaint  Patient presents with   Anxiety   Pain   Stress   Depression     Reason For Service:     I have worked with the patient for several years now. She was initially referred here for difficulty coping with the situation involving her children. Her youngest son is at severe difficulties over the years with regard to mood disorder, substance abuse and behavioral problems. Now there are major stressors associated with him again the patient has been essentially overwhelmed and unsure about what to do about. She reports that this has created a lot of stress both at home and at work. On top of that, her diabetes has gotten to the point that she had hammertoe amputated because of that. She has neuropathy as a result of diabetes as well and has had to stay out of her foot for 8 weeks.  The psychosocial stressors are having significant negative impact upon her severe diabetes with neuropathy and loss of toe.  The patient continues to have recurrent issues with residual effects of her type 2 diabetes with significant medical complications.  The reason for service has been reviewed and remains applicable for the current visit.  The patient reports that she has had a difficult time over the past week or so.  She reports that she hit her medications as her granddaughter was coming over for visit and has been unable to  find her medications.  She was able to get a refill for her pain medications as they were due for refill anyway.  She will not be able to refill her Cymbalta or Neurontin until 2 days from now.  She still has a couple of places that she may look better very unlikely places for her to have hit her medications.  Her son that had a history of taking medications from her does not live in [place] anymore and this does not appear to have been an issue.   Interventions Strategy:  Cognitive/behavioral therapeutic interventions and working on issues related to stress and her brittle diabetes/pain issues.  Participation Level:   Active  Participation Quality:  Appropriate      Behavioral Observation:  Well Groomed, Alert, and Appropriate.   Current Psychosocial Factors:   The patient reports that some of the stressors associated with her daughter's wedding have resolved but she is concerned about her oldest son's difficulties after he moved to [place] to take a teaching position.  This was a very stressful mood for her son and he has been able to return as a substitute teacher some.  Content of Session:   Reviewed current symptoms and continue to work on coping skills in dealing with both her medical issues as well as the stressors.  Current Status:   The patient reports that she has been able to finish her spinal cord stimulator surgery and reports that she is  experiencing a 50-60% improvement in her pain levels.     Last Reviewed:   07/27/2021  Goals Addressed Today:    Goals addressed included building better coping skills.  Impression/Diagnosis:   The patient has a long history of attention deficit disorder but due to [place]. psychosocial stressors she also developed clinical depression and anxiety. I think that her attentional problems were valid prior to the development of these issues that existed her entire life. However, she is in and repeat if and recurrent trauma with regard primarily to her  son and to her daughter to a lesser degree in years past.     Edgardo Roys, PsyD 09/05/2021

## 2021-09-08 ENCOUNTER — Telehealth: Payer: Self-pay

## 2021-09-08 NOTE — Telephone Encounter (Signed)
Patient not home, left message with husband for her to call back to schedule her sleep study

## 2021-09-14 ENCOUNTER — Telehealth: Payer: Self-pay

## 2021-09-14 NOTE — Telephone Encounter (Signed)
LVM for pt to call me back to schedule sleep study  

## 2021-09-22 ENCOUNTER — Other Ambulatory Visit: Payer: Self-pay

## 2021-09-22 ENCOUNTER — Encounter: Payer: HMO | Attending: Psychology | Admitting: Psychology

## 2021-09-22 DIAGNOSIS — F331 Major depressive disorder, recurrent, moderate: Secondary | ICD-10-CM | POA: Insufficient documentation

## 2021-09-22 DIAGNOSIS — F5101 Primary insomnia: Secondary | ICD-10-CM | POA: Insufficient documentation

## 2021-09-22 DIAGNOSIS — F411 Generalized anxiety disorder: Secondary | ICD-10-CM | POA: Insufficient documentation

## 2021-09-22 DIAGNOSIS — M5137 Other intervertebral disc degeneration, lumbosacral region: Secondary | ICD-10-CM | POA: Diagnosis not present

## 2021-09-22 DIAGNOSIS — G2581 Restless legs syndrome: Secondary | ICD-10-CM | POA: Insufficient documentation

## 2021-09-22 DIAGNOSIS — E1142 Type 2 diabetes mellitus with diabetic polyneuropathy: Secondary | ICD-10-CM | POA: Diagnosis not present

## 2021-09-23 ENCOUNTER — Ambulatory Visit (INDEPENDENT_AMBULATORY_CARE_PROVIDER_SITE_OTHER): Payer: HMO | Admitting: Family Medicine

## 2021-09-23 ENCOUNTER — Encounter: Payer: Self-pay | Admitting: Family Medicine

## 2021-09-23 ENCOUNTER — Encounter: Payer: Self-pay | Admitting: Psychology

## 2021-09-23 VITALS — BP 117/68 | HR 83 | Ht 69.0 in | Wt 195.0 lb

## 2021-09-23 DIAGNOSIS — E1169 Type 2 diabetes mellitus with other specified complication: Secondary | ICD-10-CM | POA: Diagnosis not present

## 2021-09-23 DIAGNOSIS — E1142 Type 2 diabetes mellitus with diabetic polyneuropathy: Secondary | ICD-10-CM | POA: Diagnosis not present

## 2021-09-23 DIAGNOSIS — Z794 Long term (current) use of insulin: Secondary | ICD-10-CM | POA: Diagnosis not present

## 2021-09-23 DIAGNOSIS — L97521 Non-pressure chronic ulcer of other part of left foot limited to breakdown of skin: Secondary | ICD-10-CM

## 2021-09-23 DIAGNOSIS — E1149 Type 2 diabetes mellitus with other diabetic neurological complication: Secondary | ICD-10-CM | POA: Diagnosis not present

## 2021-09-23 DIAGNOSIS — E1049 Type 1 diabetes mellitus with other diabetic neurological complication: Secondary | ICD-10-CM

## 2021-09-23 DIAGNOSIS — Z23 Encounter for immunization: Secondary | ICD-10-CM | POA: Diagnosis not present

## 2021-09-23 LAB — BAYER DCA HB A1C WAIVED: HB A1C (BAYER DCA - WAIVED): 7.4 % — ABNORMAL HIGH (ref 4.8–5.6)

## 2021-09-23 MED ORDER — DULOXETINE HCL 60 MG PO CPEP: 60.0000 mg | ORAL_CAPSULE | Freq: Every day | ORAL | 3 refills | Status: DC

## 2021-09-23 MED ORDER — TRULICITY 3 MG/0.5ML ~~LOC~~ SOAJ: 3.0000 mg | SUBCUTANEOUS | 3 refills | Status: DC

## 2021-09-23 MED ORDER — LANTUS SOLOSTAR 100 UNIT/ML ~~LOC~~ SOPN: 20.0000 [IU] | PEN_INJECTOR | Freq: Every day | SUBCUTANEOUS | 3 refills | Status: DC

## 2021-09-23 MED ORDER — NOVOLOG FLEXPEN 100 UNIT/ML ~~LOC~~ SOPN: 3.0000 [IU] | PEN_INJECTOR | Freq: Two times a day (BID) | SUBCUTANEOUS | 11 refills | Status: DC | PRN

## 2021-09-23 MED ORDER — ROSUVASTATIN CALCIUM 5 MG PO TABS: 5.0000 mg | ORAL_TABLET | Freq: Every day | ORAL | 3 refills | Status: DC

## 2021-09-23 NOTE — Progress Notes (Signed)
BP 117/68   Pulse 83   Ht 5\' 9"  (1.753 m)   Wt 195 lb (88.5 kg)   SpO2 97%   BMI 28.80 kg/m    Subjective:   Patient ID: Rhonda Thomas, female    DOB: September 11, 1956, 65 y.o.   MRN: [id number]  HPI: Rhonda Thomas is a 65 y.o. female presenting on 09/23/2021 for Medical Management of Chronic Issues and Diabetes   HPI Type 2 diabetes mellitus Patient comes in today for recheck of his diabetes. Patient has been currently taking Lantus 24 units and Trulicity 1.5. Patient is not currently on an ACE inhibitor/ARB. Patient has not seen an ophthalmologist this year. Patient denies any new issues with their feet. The symptom started onset as an adult neuropathy and hyperlipidemia ARE RELATED TO DM   Hyperlipidemia Patient is coming in for recheck of his hyperlipidemia. The patient is currently taking Crestor. They deny any issues with myalgias or history of liver damage from it. They deny any focal numbness or weakness or chest pain.   Anxiety depression recheck Patient is currently taking Cymbalta and Trileptal and Lyrica to help with her depression anxiety and myalgias.  She also has chronic back pain and sees pain management.  Relevant past medical, surgical, family and social history reviewed and updated as indicated. Interim medical history since our last visit reviewed. Allergies and medications reviewed and updated.  Review of Systems  Constitutional:  Negative for chills and fever.  Eyes:  Negative for visual disturbance.  Respiratory:  Negative for chest tightness and shortness of breath.   Cardiovascular:  Negative for chest pain and leg swelling.  Genitourinary:  Negative for difficulty urinating and dysuria.  Musculoskeletal:  Positive for arthralgias and back pain. Negative for gait problem.  Skin:  Negative for rash.  Neurological:  Positive for numbness. Negative for light-headedness and headaches.  Psychiatric/Behavioral:  Negative for agitation and behavioral  problems.   All other systems reviewed and are negative.  Per HPI unless specifically indicated above   Allergies as of 09/23/2021       Reactions   Actos [pioglitazone]    Edema   Invokana [canagliflozin]    vaginitis        Medication List        Accurate as of September 23, 2021  1:49 PM. If you have any questions, ask your nurse or doctor.          STOP taking these medications    Trulicity 1.5 QQ/5.9DG Sopn Generic drug: Dulaglutide Replaced by: Trulicity 3 LO/7.5IE Sopn Stopped by: Fransisca Kaufmann Kyllie Pettijohn, MD       TAKE these medications    Aspirin Low Dose 81 MG EC tablet Generic drug: aspirin TAKE 1 TABLET BY MOUTH EVERY DAY   Dexcom G6 Receiver Devi 1 each by Does not apply route 4 (four) times daily.   Dexcom G6 Sensor Misc 1 each by Does not apply route once a week.   Dexcom G6 Transmitter Misc 1 each by Does not apply route 4 (four) times daily.   DULoxetine 60 MG capsule Commonly known as: CYMBALTA Take 1 capsule (60 mg total) by mouth at bedtime.   Insulin Pen Needle 31G X 5 MM Misc Commonly known as: Global Ease Inject Pen Needles USE FOUR TIMES DAILY   Lantus SoloStar 100 UNIT/ML Solostar Pen Generic drug: insulin glargine Inject 20-30 Units into the skin daily.   metFORMIN 500 MG tablet Commonly known as: GLUCOPHAGE Take 1 tablet (500  mg total) by mouth 2 (two) times daily with a meal.   NovoLOG FlexPen 100 UNIT/ML FlexPen Generic drug: insulin aspart Inject 3-20 Units into the skin 3 times/day as needed-between meals & bedtime for high blood sugar.   Nucynta ER 150 MG Tb12 Generic drug: Tapentadol HCl Take 1 tablet by mouth every 12 (twelve) hours.   OXcarbazepine 150 MG tablet Commonly known as: TRILEPTAL Take 1 twice daily   pregabalin 150 MG capsule Commonly known as: LYRICA 1 in am 2 at night   rOPINIRole 1 MG tablet Commonly known as: Requip Take 1 tablet (1 mg total) by mouth at bedtime.   rosuvastatin 5 MG  tablet Commonly known as: CRESTOR TAKE 1 TABLET BY MOUTH AT BEDTIME   Trulicity 3 TX/6.4WO Sopn Generic drug: Dulaglutide Inject 3 mg as directed once a week. Replaces: Trulicity 1.5 EH/2.1YY Sopn Started by: Fransisca Kaufmann Monika Chestang, MD   VITAMIN D PO Take 1,000 Units by mouth daily.         Objective:   BP 117/68   Pulse 83   Ht 5\' 9"  (1.753 m)   Wt 195 lb (88.5 kg)   SpO2 97%   BMI 28.80 kg/m   Wt Readings from Last 3 Encounters:  09/23/21 195 lb (88.5 kg)  08/31/21 197 lb 12.8 oz (89.7 kg)  06/24/21 198 lb (89.8 kg)    Physical Exam Vitals and nursing note reviewed.  Constitutional:      General: She is not in acute distress.    Appearance: She is well-developed. She is not diaphoretic.  Eyes:     Conjunctiva/sclera: Conjunctivae normal.  Cardiovascular:     Rate and Rhythm: Normal rate and regular rhythm.     Heart sounds: Normal heart sounds. No murmur heard. Pulmonary:     Effort: Pulmonary effort is normal. No respiratory distress.     Breath sounds: Normal breath sounds. No wheezing.  Musculoskeletal:        General: No tenderness. Normal range of motion.  Skin:    General: Skin is warm and dry.     Findings: No rash.  Neurological:     Mental Status: She is alert and oriented to person, place, and time.     Coordination: Coordination normal.  Psychiatric:        Behavior: Behavior normal.      Assessment & Plan:   Problem List Items Addressed This Visit       Endocrine   Type 2 diabetes mellitus with other specified complication ([facility]) - Primary   Relevant Medications   Dulaglutide (TRULICITY) 3 QM/2.5OI SOPN   Other Relevant Orders   Bayer DCA Hb A1c Waived   Microalbumin / creatinine urine ratio   AMB Referral to [facility]   Other Visit Diagnoses     Type 2 diabetes mellitus with other diabetic neurological complication (H[facility])       Relevant Medications   Dulaglutide (TRULICITY) 3 BB/0.4UG SOPN   Other Relevant Orders    AMB Referral to [facility]       Will increase Trulicity, Q9V increased slightly at 7.4. Follow up plan: Return in about 3 months (around 12/24/2021), or if symptoms worsen or fail to improve, for Diabetes recheck.  Counseling provided for all of the vaccine components Orders Placed This Encounter  Procedures   Bayer DCA Hb A1c Waived   Microalbumin / creatinine urine ratio   AMB Referral to [place], MD Tristan Schroeder  [place] 09/23/2021, 1:49 PM

## 2021-09-23 NOTE — Progress Notes (Signed)
Patient ID: Rhonda Thomas, female   DOB: 01-31-1956, 65 y.o.   MRN: [id number] [name]   DOB: Jun 24, 1956  MR Number: [id number]  Location: [facility] [place], [place] [id number] [place] [id number] Dept: [phone]  Start: 11 AM End: 12 PM  Today's visit was an in person visit that lasted for 1 hour and was conducted in my outpatient clinic office with the patient myself present.  Provider/Observer:     Edgardo Roys PsyD  Chief Complaint:      Chief Complaint  Patient presents with   Anxiety   Depression   Pain   Sleeping Problem     Reason For Service:     I have worked with the patient for several years now. She was initially referred here for difficulty coping with the situation involving her children. Her youngest son is at severe difficulties over the years with regard to mood disorder, substance abuse and behavioral problems. Now there are major stressors associated with him again the patient has been essentially overwhelmed and unsure about what to do about. She reports that this has created a lot of stress both at home and at work. On top of that, her diabetes has gotten to the point that she had hammertoe amputated because of that. She has neuropathy as a result of diabetes as well and has had to stay out of her foot for 8 weeks.  The psychosocial stressors are having significant negative impact upon her severe diabetes with neuropathy and loss of toe.  The patient continues to have recurrent issues with residual effects of her type 2 diabetes with significant medical complications.  The reason for service has been reviewed and remains applicable for the current visit.  The patient reports that she is still dealing with anxiety and depressive type symptomatology and continues to take her psychotropic medications including Cymbalta,  Trileptal and also taking Lyrica for peripheral neuropathy.   Interventions Strategy:  Cognitive/behavioral therapeutic interventions and working on issues related to stress and her brittle diabetes/pain issues.  The patient has continued to struggle with significant sleep difficulties and we also worked on sleep hygiene issues.  The patient previously been diagnosed with sleep apnea and prescribed CPAP but she developed ear difficulties that were associated with the CPAP device and is no longer able to use it.  The patient recently had a consult with Dr. Rexene Alberts regarding assessment for obstructive sleep apnea and she is being scheduled for formal sleep study and assessment of whether she would qualify for the inspire implanted device for her sleep apnea.  Participation Level:   Active  Participation Quality:  Appropriate      Behavioral Observation:  Well Groomed, Alert, and Appropriate.   Current Psychosocial Factors:   The patient reports that there have been significant stressors associated with her youngest son who recently was in a motor vehicle accident and had possible injury to his spleen and neck.  He is not in the hospital now but has lost his car as it was only with liability insurance and unable to work for some period of time but in financial stress and pressure on the patient and her husband.  Content of Session:   Reviewed current symptoms and continue to work on coping skills in dealing with both her medical issues as well as the stressors.  Current Status:   The patient reports that  she has been able to finish her spinal cord stimulator surgery and reports that she is experiencing a 50-60% improvement in her pain levels.     Last Reviewed:   09/22/2021  Goals Addressed Today:    Goals addressed included building better coping skills.  Impression/Diagnosis:   The patient has a long history of attention deficit disorder but due to Maj. psychosocial stressors she also developed  clinical depression and anxiety. I think that her attentional problems were valid prior to the development of these issues that existed her entire life. However, she is in and repeat if and recurrent trauma with regard primarily to her son and to her daughter to a lesser degree in years past.     Edgardo Roys, PsyD 09/23/2021

## 2021-09-24 ENCOUNTER — Telehealth: Payer: Self-pay

## 2021-09-24 ENCOUNTER — Ambulatory Visit: Payer: HMO

## 2021-09-24 NOTE — Telephone Encounter (Signed)
Received fax from pharmacy. Pts insurance will not cover Lantus Solostar.  Insurance will cover:  Insulin Aspart Flex Pen  Insulin Degludec and Flex Touch  Insulin Glargine and Solostar  Insulin Lispro Prot and Lispro  Soliqu

## 2021-09-27 ENCOUNTER — Telehealth: Payer: Self-pay

## 2021-09-27 MED ORDER — INSULIN GLARGINE SOLOSTAR 100 UNIT/ML ~~LOC~~ SOPN: 20.0000 [IU] | PEN_INJECTOR | Freq: Every day | SUBCUTANEOUS | 3 refills | Status: DC

## 2021-09-27 NOTE — Chronic Care Management (AMB) (Signed)
  Chronic Care Management   Note  09/27/2021 Name: Adaora Mchaney MRN: [id number] DOB: 05-Apr-1956  Yitzel Shasteen is a 65 y.o. year old female who is a primary care patient of Detting[place], Fransisca Kaufmann, MD. I reached out to [name] by phone today in response to a referral sent by Ms. Madlyn Frankel Hoctor's PCP.  Ms. Schobert was given information about Chronic Care Management services today including:  CCM service includes personalized support from designated clinical staff supervised by her physician, including individualized plan of care and coordination with other care providers 24/7 contact phone numbers for assistance for urgent and routine care needs. Service will only be billed when office clinical staff spend 20 minutes or more in a month to coordinate care. Only one practitioner may furnish and bill the service in a calendar month. The patient may stop CCM services at any time (effective at the end of the month) by phone call to the office staff. The patient is responsible for co-pay (up to 20% after annual deductible is met) if co-pay is required by the individual health plan.   Patient agreed to services and verbal consent obtained.   Follow up plan: Telephone appointment with care management team member scheduled for:10/22/2021  Noreene Larsson, [place], [place], [place] [address] Direct Dial: [phone] [email or web address] Website: [email or web address]

## 2021-09-27 NOTE — Telephone Encounter (Signed)
Sent in insulin glargine Solostar

## 2021-09-29 ENCOUNTER — Ambulatory Visit (INDEPENDENT_AMBULATORY_CARE_PROVIDER_SITE_OTHER): Payer: HMO

## 2021-09-29 VITALS — Ht 69.0 in | Wt 195.0 lb

## 2021-09-29 DIAGNOSIS — Z0000 Encounter for general adult medical examination without abnormal findings: Secondary | ICD-10-CM | POA: Diagnosis not present

## 2021-09-29 DIAGNOSIS — Z780 Asymptomatic menopausal state: Secondary | ICD-10-CM | POA: Diagnosis not present

## 2021-09-29 NOTE — Progress Notes (Signed)
Subjective:   Rhonda Thomas is a 65 y.o. female who presents for Medicare Annual (Subsequent) preventive examination.  Virtual Visit via Telephone Note  I connected with  Rhonda Thomas on 09/29/21 at  9:00 AM EDT by telephone and verified that I am speaking with the correct person using two identifiers.  Location: Patient: Home Provider: [facility] Persons participating in the virtual visit: patient/Nurse Health Advisor   I discussed the limitations, risks, security and privacy concerns of performing an evaluation and management service by telephone and the availability of in person appointments. The patient expressed understanding and agreed to proceed.  Interactive audio and video telecommunications were attempted between this nurse and patient, however failed, due to patient having technical difficulties OR patient did not have access to video capability.  We continued and completed visit with audio only.  Some vital signs may be absent or patient reported.   Kinesha Auten E Jenaro Souder, LPN   Review of Systems     Cardiac Risk Factors include: advanced age (>49men, >51 women);diabetes mellitus;dyslipidemia     Objective:    Today's Vitals   09/29/21 0904 09/29/21 0906  Weight: 195 lb (88.5 kg)   Height: 5\' 9"  (1.753 m)   PainSc:  2    Body mass index is 28.8 kg/m.  Advanced Directives 09/29/2021 06/26/2019 07/20/2018 04/14/2016 04/29/2015  Does Patient Have a Medical Advance Directive? No No No No No  Would patient like information on creating a medical advance directive? No - Patient declined - No - Patient declined - Yes - Educational materials given    Current Medications (verified) Outpatient Encounter Medications as of 09/29/2021  Medication Sig   ASPIRIN LOW DOSE 81 MG EC tablet TAKE 1 TABLET BY MOUTH EVERY DAY   Continuous Blood Gluc Receiver (DEXCOM G6 RECEIVER) DEVI 1 each by Does not apply route 4 (four) times daily.   Continuous Blood Gluc Sensor (DEXCOM G6  SENSOR) MISC 1 each by Does not apply route once a week.   Continuous Blood Gluc Transmit (DEXCOM G6 TRANSMITTER) MISC 1 each by Does not apply route 4 (four) times daily.   Dulaglutide (TRULICITY) 3 BW/4.6KZ SOPN Inject 3 mg as directed once a week.   DULoxetine (CYMBALTA) 60 MG capsule Take 1 capsule (60 mg total) by mouth at bedtime.   insulin aspart (NOVOLOG FLEXPEN) 100 UNIT/ML FlexPen Inject 3-20 Units into the skin 3 times/day as needed-between meals & bedtime for high blood sugar.   Insulin Glargine Solostar (LANTUS) 100 UNIT/ML Solostar Pen Inject 20-30 Units into the skin daily.   Insulin Pen Needle (GLOBAL EASE INJECT PEN NEEDLES) 31G X 5 MM MISC USE FOUR TIMES DAILY   metFORMIN (GLUCOPHAGE) 500 MG tablet Take 1 tablet (500 mg total) by mouth 2 (two) times daily with a meal.   NUCYNTA ER 150 MG TB12 Take 1 tablet by mouth every 12 (twelve) hours.   OXcarbazepine (TRILEPTAL) 150 MG tablet Take 1 twice daily   pregabalin (LYRICA) 150 MG capsule 1 in am 2 at night   rOPINIRole (REQUIP) 1 MG tablet Take 1 tablet (1 mg total) by mouth at bedtime.   rosuvastatin (CRESTOR) 5 MG tablet Take 1 tablet (5 mg total) by mouth at bedtime.   VITAMIN D PO Take 1,000 Units by mouth daily.   No facility-administered encounter medications on file as of 09/29/2021.    Allergies (verified) Invokana [canagliflozin]   History: Past Medical History:  Diagnosis Date   Anxiety    Attention deficit  disorder (ADD)    Depression    Diabetes ([place])    Hiatal hernia    Movement disorder    Neuropathy    Schatzki's ring    Sleep apnea    Past Surgical History:  Procedure Laterality Date   ABDOMINAL HYSTERECTOMY     BREAST LUMPECTOMY WITH RADIOACTIVE SEED LOCALIZATION Right 04/25/2016   Procedure: RIGHT BREAST LUMPECTOMY WITH RADIOACTIVE SEED LOCALIZATION;  Surgeon: Autumn Messing III, MD;  Location: [place];  Service: General;  Laterality: Right;   CHOLECYSTECTOMY     RECTOCELE REPAIR      SPINAL CORD STIMULATOR IMPLANT     TOE AMPUTATION Right    2nd toe rt foot   TUBAL LIGATION     Family History  Problem Relation Age of Onset   Stroke Mother    Sleep apnea Mother    Diabetes Father    Heart disease Father        pacemaker   Sleep apnea Father    Diabetes Other    Heart Problems Other    Colon cancer Neg Hx    Social History   Socioeconomic History   Marital status: Married    Spouse name: Rhonda Thomas   Number of children: 3   Years of education: college   Highest education level: Not on file  Occupational History   Occupation: Retired    Fish farm manager: [employer]    Comment: [place]  Tobacco Use   Smoking status: Never   Smokeless tobacco: Never  Scientific laboratory technician Use: Never used  Substance and Sexual Activity   Alcohol use: No    Alcohol/week: 0.0 standard drinks   Drug use: Never   Sexual activity: Not on file  Other Topics Concern   Not on file  Social History Narrative   Patient lives at home with her husband. Rhonda Thomas).   Education- College   Right handed.   Caffeine- tea one cup daily.      Social Determinants of Health   Financial Resource Strain: Low Risk    Difficulty of Paying Living Expenses: Not hard at all  Food Insecurity: No Food Insecurity   Worried About Charity fundraiser in the Last Year: Never true   [place] in the Last Year: Never true  Transportation Needs: No Transportation Needs   Lack of Transportation (Medical): No   Lack of Transportation (Non-Medical): No  Physical Activity: Sufficiently Active   Days of Exercise per Week: 5 days   Minutes of Exercise per Session: 30 min  Stress: Stress Concern Present   Feeling of Stress : To some extent  Social Connections: Engineer, building services of Communication with Friends and Family: More than three times a week   Frequency of Social Gatherings with Friends and Family: More than three times a week   Attends Religious Services:  1 to 4 times per year   Active Member of [employer] or Organizations: Yes   Attends Archivist Meetings: 1 to 4 times per year   Marital Status: Married    Tobacco Counseling Counseling given: Not Answered   Clinical Intake:  Pre-visit preparation completed: Yes  Pain : 0-10 Pain Score: 2  Pain Type: Chronic pain Pain Location: Generalized Pain Orientation: Right Pain Descriptors / Indicators: Aching, Discomfort, Tender Pain Onset: More than a month ago Pain Frequency: Intermittent     BMI - recorded: 28.8 Nutritional Status: BMI 25 -29 Overweight Nutritional Risks:  None Diabetes: Yes CBG done?: No Did pt. bring in CBG monitor from home?: No  How often do you need to have someone help you when you read instructions, pamphlets, or other written materials from your doctor or pharmacy?: 1 - Never  Diabetic? Yes Nutrition Risk Assessment:  Has the patient had any N/V/D within the last 2 months?  No  Does the patient have any non-healing wounds?  No  Has the patient had any unintentional weight loss or weight gain?  No   Diabetes:  Is the patient diabetic?  Yes  If diabetic, was a CBG obtained today?  No  Did the patient bring in their glucometer from home?  No  How often do you monitor your CBG's? Once every other day - trying to get CGM, but is in donut hole - has appt with pharmacist soon.   Financial Strains and Diabetes Management:  Are you having any financial strains with the device, your supplies or your medication?  Just CGM - has appt with clinical pharmacist next month .  Does the patient want to be seen by Chronic Care Management for management of their diabetes?  No  Would the patient like to be referred to a Nutritionist or for Diabetic Management?  No   Diabetic Exams:  Diabetic Eye Exam: Completed 01/2021.  Diabetic Foot Exam: Completed 09/14/2020. Pt has been advised about the importance in completing this exam. Pt is scheduled for diabetic foot  exam on next visit.    Interpreter Needed?: No  Information entered by :: [facility] In your present state of health, do you have any difficulty performing the following activities: 09/29/2021 10/15/2020  Hearing? N N  Vision? N Y  Comment - cataracts  Difficulty concentrating or making decisions? Tempie Donning  Walking or climbing stairs? Y Y  Comment - difficulty w/ gait  Dressing or bathing? N N  Doing errands, shopping? Tempie Donning  Preparing Food and eating ? N -  Using the Toilet? N -  In the past six months, have you accidently leaked urine? Y -  Comment mild -  Do you have problems with loss of bowel control? N -  Managing your Medications? N -  Managing your Finances? N -  Housekeeping or managing your Housekeeping? Y -  Some recent data might be hidden    Patient Care Team: Dettinger, Fransisca Kaufmann, MD as PCP - General (Family Medicine) Lucia Bitter., MD as Physician Assistant (Pain Medicine) Lavera Guise, [facility] as [place] Management (Pharmacist)  Indicate any recent Medical Services you may have received from other than [facility] providers in the past year (date may be approximate).     Assessment:   This is a routine wellness examination for [employer].  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, very busy), Time (Minutes): 30, Frequency (Times/Week): 5, Weekly Exercise (Minutes/Week): 150, Intensity: Moderate, Exercise limited by: neurologic condition(s)   Goals Addressed             This Visit's Progress    DIET - REDUCE SODIUM INTAKE   On track    Follow up with Primary Care Provider   On  track       Depression Screen PHQ 2/9 Scores 09/29/2021 06/21/2021 05/17/2021 10/26/2020 09/14/2020 02/28/2019 09/03/2018  PHQ - 2 Score 2 2 2  0 0 1 2  PHQ- 9 Score 5 - 5 - - - 10  Exception Documentation - - - - - - -  Not completed - - - - - - -    Fall Risk Fall Risk  09/29/2021 06/21/2021 05/17/2021 10/26/2020 10/15/2020  Falls in the past year? 1 1 1 1 1   Number falls in past yr: 1 1 1  0 0  Injury with Fall? 1 0 0 0 0  Risk for fall due to : History of fall(s);Impaired balance/gait;Orthopedic patient;Medication side effect History of fall(s);Impaired balance/gait History of fall(s) Impaired balance/gait Impaired balance/gait  Follow up Education provided;Falls prevention discussed Falls evaluation completed Falls evaluation completed Falls evaluation completed Falls evaluation completed    FALL RISK PREVENTION PERTAINING TO THE HOME:  Any stairs in or around the home? Yes  If so, are there any without handrails? No  Home free of loose throw rugs in walkways, pet beds, electrical cords, etc? Yes  Adequate lighting in your home to reduce risk of falls? Yes   ASSISTIVE DEVICES UTILIZED TO PREVENT FALLS:  Life alert? No  Use of a cane, walker or w/c? No  Grab bars in the bathroom? No  Shower chair or bench in shower? Yes  Elevated toilet seat or a handicapped toilet? Yes   TIMED UP AND GO:  Was the test performed? No . Telephonic visit  Cognitive Function: Normal cognitive status assessed by direct observation by this Nurse Health Advisor. No abnormalities found.    MMSE - Mini Mental State Exam 07/20/2018  Orientation to time 5  Orientation to Place 5  Registration 3  Attention/ Calculation 5  Recall 3  Language- name 2 objects 2  Language- repeat 1  Language- follow 3 step command 3  Language- read & follow direction 1  Write a sentence 1  Copy design 1  Total score 30     6CIT Screen 09/29/2021  What Year? 0 points  What month? 0 points  What time? 0 points  Count back  from 20 0 points  Months in reverse 0 points  Repeat phrase 2 points  Total Score 2    Immunizations Immunization History  Administered Date(s) Administered   Fluad Quad(high Dose 65+) 09/23/2021   Influenza, Seasonal, Injecte, Preservative Fre 09/28/2015   Influenza,inj,Quad PF,6+ Mos 09/17/2014, 11/29/2016, 09/29/2017, 10/11/2018, 11/21/2019, 09/14/2020   Influenza-Unspecified 09/10/2013   Pneumococcal Conjugate-13 11/07/2017   Tdap 05/31/2017   Zoster, Live 06/23/2015    TDAP status: Up to date  Flu Vaccine status: Up to date  Pneumococcal vaccine status: Due, Education has been provided regarding the importance of this vaccine. Advised may receive this vaccine at local pharmacy or Health Dept. Aware to provide a copy of the vaccination record if obtained from local pharmacy or Health Dept. Verbalized acceptance and understanding.  Covid-19 vaccine status: Completed vaccines  Qualifies for Shingles Vaccine? Yes   Zostavax completed Yes   Shingrix Completed?: No.    Education has been provided regarding the importance of this vaccine. Patient has been advised to call insurance company to determine out of pocket expense if they have not yet received this vaccine. Advised may also receive vaccine at local pharmacy or Health Dept. Verbalized acceptance and understanding.  Screening Tests Health Maintenance  Topic Date Due   COVID-19 Vaccine (1) Never done  Zoster Vaccines- Shingrix (1 of 2) Never done   PAP SMEAR-Modifier  Never done   URINE MICROALBUMIN  01/13/2021   DEXA SCAN  Never done   FOOT EXAM  09/14/2021   MAMMOGRAM  12/08/2021 (Originally 11/14/2020)   OPHTHALMOLOGY EXAM  01/31/2022   HEMOGLOBIN A1C  03/24/2022   COLONOSCOPY (Pts 45-74yrs Insurance coverage will need to be confirmed)  09/10/2022   TETANUS/TDAP  06/01/2027   INFLUENZA VACCINE  Completed   Hepatitis C Screening  Completed   HIV Screening  Completed   HPV VACCINES  Aged Out    Health  Maintenance  Health Maintenance Due  Topic Date Due   COVID-19 Vaccine (1) Never done   Zoster Vaccines- Shingrix (1 of 2) Never done   PAP SMEAR-Modifier  Never done   URINE MICROALBUMIN  01/13/2021   DEXA SCAN  Never done   FOOT EXAM  09/14/2021    Colorectal cancer screening: Type of screening: Colonoscopy. Completed 09/10/2012. Repeat every 10 years  Mammogram status: Ordered 09/29/21. Pt provided with contact info and advised to call to schedule appt.   Bone Density status: Ordered 09/29/21. Pt provided with contact info and advised to call to schedule appt.  Lung Cancer Screening: (Low Dose CT Chest recommended if Age 63-80 years, 30 pack-year currently smoking OR have quit w/in 15years.) does not qualify.   Additional Screening:  Hepatitis C Screening: does qualify; Completed 11/29/2016  Vision Screening: Recommended annual ophthalmology exams for early detection of glaucoma and other disorders of the eye. Is the patient up to date with their annual eye exam?  Yes  Who is the provider or what is the name of the office in which the patient attends annual eye exams? [facility] If pt is not established with a provider, would they like to be referred to a provider to establish care? No .   Dental Screening: Recommended annual dental exams for proper oral hygiene  Community Resource Referral / Chronic Care Management: CRR required this visit?  No   CCM required this visit?  No      Plan:     I have personally reviewed and noted the following in the patient's chart:   Medical and social history Use of alcohol, tobacco or illicit drugs  Current medications and supplements including opioid prescriptions.  Functional ability and status Nutritional status Physical activity Advanced directives List of other physicians Hospitalizations, surgeries, and ER visits in previous 12 months Vitals Screenings to include cognitive, depression, and falls Referrals and  appointments  In addition, I have reviewed and discussed with patient certain preventive protocols, quality metrics, and best practice recommendations. A written personalized care plan for preventive services as well as general preventive health recommendations were provided to patient.     Sandrea Hammond, LPN   [id number]   Nurse Notes: None

## 2021-09-29 NOTE — Patient Instructions (Signed)
Rhonda Thomas , Thank you for taking time to come for your Medicare Wellness Visit. I appreciate your ongoing commitment to your health goals. Please review the following plan we discussed and let me know if I can assist you in the future.   Screening recommendations/referrals: Colonoscopy: Done 09/10/2012 - Repeat in 10 years Mammogram: Done 11/14/2018 - Repeat annually *made appointment for 12/28 @ 10 Bone Density: Due - ordered today Recommended yearly ophthalmology/optometry visit for glaucoma screening and checkup Recommended yearly dental visit for hygiene and checkup  Vaccinations: Influenza vaccine: Done 09/23/2021 - Repeat annually Pneumococcal vaccine: Done 11/07/2017 - due for pneumovax 23 Tdap vaccine: Done 05/31/2017 - Repeat in 10 years Shingles vaccine: Zostavax done 2016 - Due for Shingrix #1 and #2   Covid-19: Done - we need dates please  Advanced directives: Advance directive discussed with you today. Even though you declined this today, please call our office should you change your mind, and we can give you the proper paperwork for you to fill out.   Conditions/risks identified: Aim for 30 minutes of exercise or brisk walking each day, drink 6-8 glasses of water and eat lots of fruits and vegetables.   Next appointment: Follow up in one year for your annual wellness visit    Preventive Care 65 Years and Older, Female Preventive care refers to lifestyle choices and visits with your health care provider that can promote health and wellness. What does preventive care include? A yearly physical exam. This is also called an annual well check. Dental exams once or twice a year. Routine eye exams. Ask your health care provider how often you should have your eyes checked. Personal lifestyle choices, including: Daily care of your teeth and gums. Regular physical activity. Eating a healthy diet. Avoiding tobacco and drug use. Limiting alcohol use. Practicing safe sex. Taking  low-dose aspirin every day. Taking vitamin and mineral supplements as recommended by your health care provider. What happens during an annual well check? The services and screenings done by your health care provider during your annual well check will depend on your age, overall health, lifestyle risk factors, and family history of disease. Counseling  Your health care provider may ask you questions about your: Alcohol use. Tobacco use. Drug use. Emotional well-being. Home and relationship well-being. Sexual activity. Eating habits. History of falls. Memory and ability to understand (cognition). Work and work Statistician. Reproductive health. Screening  You may have the following tests or measurements: Height, weight, and BMI. Blood pressure. Lipid and cholesterol levels. These may be checked every 5 years, or more frequently if you are over 22 years old. Skin check. Lung cancer screening. You may have this screening every year starting at age 21 if you have a 30-pack-year history of smoking and currently smoke or have quit within the past 15 years. Fecal occult blood test (FOBT) of the stool. You may have this test every year starting at age 58. Flexible sigmoidoscopy or colonoscopy. You may have a sigmoidoscopy every 5 years or a colonoscopy every 10 years starting at age 49. Hepatitis C blood test. Hepatitis B blood test. Sexually transmitted disease (STD) testing. Diabetes screening. This is done by checking your blood sugar (glucose) after you have not eaten for a while (fasting). You may have this done every 1-3 years. Bone density scan. This is done to screen for osteoporosis. You may have this done starting at age 53. Mammogram. This may be done every 1-2 years. Talk to your health care provider about  how often you should have regular mammograms. Talk with your health care provider about your test results, treatment options, and if necessary, the need for more tests. Vaccines   Your health care provider may recommend certain vaccines, such as: Influenza vaccine. This is recommended every year. Tetanus, diphtheria, and acellular pertussis (Tdap, Td) vaccine. You may need a Td booster every 10 years. Zoster vaccine. You may need this after age 77. Pneumococcal 13-valent conjugate (PCV13) vaccine. One dose is recommended after age 53. Pneumococcal polysaccharide (PPSV23) vaccine. One dose is recommended after age 50. Talk to your health care provider about which screenings and vaccines you need and how often you need them. This information is not intended to replace advice given to you by your health care provider. Make sure you discuss any questions you have with your health care provider. Document Released: 12/25/2015 Document Revised: 08/17/2016 Document Reviewed: 09/29/2015 Elsevier Interactive Patient Education  2017 [place] Prevention in the Home Falls can cause injuries. They can happen to people of all ages. There are many things you can do to make your home safe and to help prevent falls. What can I do on the outside of my home? Regularly fix the edges of walkways and driveways and fix any cracks. Remove anything that might make you trip as you walk through a door, such as a raised step or threshold. Trim any bushes or trees on the path to your home. Use bright outdoor lighting. Clear any walking paths of anything that might make someone trip, such as rocks or tools. Regularly check to see if handrails are loose or broken. Make sure that both sides of any steps have handrails. Any raised decks and porches should have guardrails on the edges. Have any leaves, snow, or ice cleared regularly. Use sand or salt on walking paths during winter. Clean up any spills in your garage right away. This includes oil or grease spills. What can I do in the bathroom? Use night lights. Install grab bars by the toilet and in the tub and shower. Do not use towel  bars as grab bars. Use non-skid mats or decals in the tub or shower. If you need to sit down in the shower, use a plastic, non-slip stool. Keep the floor dry. Clean up any water that spills on the floor as soon as it happens. Remove soap buildup in the tub or shower regularly. Attach bath mats securely with double-sided non-slip rug tape. Do not have throw rugs and other things on the floor that can make you trip. What can I do in the bedroom? Use night lights. Make sure that you have a light by your bed that is easy to reach. Do not use any sheets or blankets that are too big for your bed. They should not hang down onto the floor. Have a firm chair that has side arms. You can use this for support while you get dressed. Do not have throw rugs and other things on the floor that can make you trip. What can I do in the kitchen? Clean up any spills right away. Avoid walking on wet floors. Keep items that you use a lot in easy-to-reach places. If you need to reach something above you, use a strong step stool that has a grab bar. Keep electrical cords out of the way. Do not use floor polish or wax that makes floors slippery. If you must use wax, use non-skid floor wax. Do not have throw rugs and other  things on the floor that can make you trip. What can I do with my stairs? Do not leave any items on the stairs. Make sure that there are handrails on both sides of the stairs and use them. Fix handrails that are broken or loose. Make sure that handrails are as long as the stairways. Check any carpeting to make sure that it is firmly attached to the stairs. Fix any carpet that is loose or worn. Avoid having throw rugs at the top or bottom of the stairs. If you do have throw rugs, attach them to the floor with carpet tape. Make sure that you have a light switch at the top of the stairs and the bottom of the stairs. If you do not have them, ask someone to add them for you. What else can I do to help  prevent falls? Wear shoes that: Do not have high heels. Have rubber bottoms. Are comfortable and fit you well. Are closed at the toe. Do not wear sandals. If you use a stepladder: Make sure that it is fully opened. Do not climb a closed stepladder. Make sure that both sides of the stepladder are locked into place. Ask someone to hold it for you, if possible. Clearly mark and make sure that you can see: Any grab bars or handrails. First and last steps. Where the edge of each step is. Use tools that help you move around (mobility aids) if they are needed. These include: Canes. Walkers. Scooters. Crutches. Turn on the lights when you go into a dark area. Replace any light bulbs as soon as they burn out. Set up your furniture so you have a clear path. Avoid moving your furniture around. If any of your floors are uneven, fix them. If there are any pets around you, be aware of where they are. Review your medicines with your doctor. Some medicines can make you feel dizzy. This can increase your chance of falling. Ask your doctor what other things that you can do to help prevent falls. This information is not intended to replace advice given to you by your health care provider. Make sure you discuss any questions you have with your health care provider. Document Released: 09/24/2009 Document Revised: 05/05/2016 Document Reviewed: 01/02/2015 Elsevier Interactive Patient Education  2017 [employer].

## 2021-10-05 DIAGNOSIS — D3617 Benign neoplasm of peripheral nerves and autonomic nervous system of trunk, unspecified: Secondary | ICD-10-CM | POA: Diagnosis not present

## 2021-10-05 DIAGNOSIS — L570 Actinic keratosis: Secondary | ICD-10-CM | POA: Diagnosis not present

## 2021-10-05 DIAGNOSIS — L814 Other melanin hyperpigmentation: Secondary | ICD-10-CM | POA: Diagnosis not present

## 2021-10-05 DIAGNOSIS — D485 Neoplasm of uncertain behavior of skin: Secondary | ICD-10-CM | POA: Diagnosis not present

## 2021-10-20 ENCOUNTER — Other Ambulatory Visit: Payer: Self-pay

## 2021-10-20 ENCOUNTER — Ambulatory Visit (INDEPENDENT_AMBULATORY_CARE_PROVIDER_SITE_OTHER): Payer: HMO | Admitting: Neurology

## 2021-10-20 DIAGNOSIS — G4719 Other hypersomnia: Secondary | ICD-10-CM

## 2021-10-20 DIAGNOSIS — R351 Nocturia: Secondary | ICD-10-CM

## 2021-10-20 DIAGNOSIS — E663 Overweight: Secondary | ICD-10-CM

## 2021-10-20 DIAGNOSIS — Z789 Other specified health status: Secondary | ICD-10-CM

## 2021-10-20 DIAGNOSIS — G4733 Obstructive sleep apnea (adult) (pediatric): Secondary | ICD-10-CM | POA: Diagnosis not present

## 2021-10-21 DIAGNOSIS — G2581 Restless legs syndrome: Secondary | ICD-10-CM | POA: Diagnosis not present

## 2021-10-21 DIAGNOSIS — Z5181 Encounter for therapeutic drug level monitoring: Secondary | ICD-10-CM | POA: Diagnosis not present

## 2021-10-21 DIAGNOSIS — E1140 Type 2 diabetes mellitus with diabetic neuropathy, unspecified: Secondary | ICD-10-CM | POA: Diagnosis not present

## 2021-10-21 DIAGNOSIS — M79671 Pain in right foot: Secondary | ICD-10-CM | POA: Diagnosis not present

## 2021-10-21 DIAGNOSIS — Z79899 Other long term (current) drug therapy: Secondary | ICD-10-CM | POA: Diagnosis not present

## 2021-10-21 DIAGNOSIS — M79672 Pain in left foot: Secondary | ICD-10-CM | POA: Diagnosis not present

## 2021-10-21 DIAGNOSIS — G894 Chronic pain syndrome: Secondary | ICD-10-CM | POA: Diagnosis not present

## 2021-10-22 ENCOUNTER — Ambulatory Visit (INDEPENDENT_AMBULATORY_CARE_PROVIDER_SITE_OTHER): Payer: HMO | Admitting: Pharmacist

## 2021-10-22 DIAGNOSIS — E785 Hyperlipidemia, unspecified: Secondary | ICD-10-CM

## 2021-10-22 DIAGNOSIS — Z794 Long term (current) use of insulin: Secondary | ICD-10-CM

## 2021-10-22 MED ORDER — BASAGLAR KWIKPEN 100 UNIT/ML ~~LOC~~ SOPN: 30.0000 [IU] | PEN_INJECTOR | Freq: Every day | SUBCUTANEOUS | 5 refills | Status: DC

## 2021-10-22 NOTE — Progress Notes (Signed)
Chronic Care Management Pharmacy Note  10/22/2021 Name:  Terilynn Buresh MRN:  [id number] DOB:  [birth year]  Summary: T2DM, HLD  Recommendations/Changes made from today's visit: Diabetes: New goal. Uncontrolled-A1C 7.4%; current treatment:BASAGLAR, NOVOLOG, TRULICITY, METFORMIN;  Continue current insulin and metformin Will attempt to increase Trulicity to 0.3OZ as tolerated-->samples left in fridge Denies personal and family history of Medullary thyroid cancer (MTC) Will follow up to increase as tolerated--> may be able to decrease insulin Current glucose readings: fasting glucose: <155, post prandial glucose: <200s  USES DEXCOM G6 CGM (PER INSURANCE) Denies hypoglycemic/hyperglycemic symptoms Discussed meal planning options and Plate method for healthy eating Avoid sugary drinks and desserts Incorporate balanced protein, non starchy veggies, 1 serving of carbohydrate with each meal Increase water intake Increase physical activity as able Current exercise: N/A Assessed patient finances. Patient has dual state health plan commercial and medicare and is therefore not eligible for copay cards from manufacturers.  She is also not eligible for patient assistance programs.  [facility] and her insulin copays are $0 copays per month.  Her insurance prefers [employer] for basal insulin and Novolog for rapid/meal time (corrected and sent in).  Trulicity is $22/QMGNO  Hyperlipidemia:  New goal. Controlled; current treatment:rosuvastatin 31m daily;  LDL currently at goal, however triglycerides elevated; dietary changes necessary Medications previously tried: n/a  Current dietary patterns: encouraged heart healthy diet Educated on diet/heart healthy options  Patient Goals/Self-Care Activities patient will:  - take medications as prescribed as evidenced by patient report and record review check glucose continuously using CGM, document, and provide at future appointments engage in  dietary modifications by following heart healthy diet  Plan: F/U IN 2 MONTHS  Subjective: TJamoni Broadfootis an 65y.o. year old female who is a primary patient of Dettinger, JFransisca Kaufmann MD.  The CCM team was consulted for assistance with disease management and care coordination needs.    Engaged with patient by telephone for initial visit in response to provider referral for pharmacy case management and/or care coordination services.   Consent to Services:  The patient was given information about Chronic Care Management services, agreed to services, and gave verbal consent prior to initiation of services.  Please see initial visit note for detailed documentation.   Patient Care Team: Dettinger, JFransisca Kaufmann MD as PCP - General (Family Medicine) RLucia Bitter, MD as Physician Assistant (Pain Medicine) [facility]a [facility] [place] Pharmacist (Family Medicine)  Objective:  Lab Results  Component Value Date   CREATININE 0.74 06/21/2021   CREATININE 0.83 09/14/2020   CREATININE 0.66 01/14/2020    Lab Results  Component Value Date   HGBA1C 7.4 (H) 09/23/2021   Last diabetic Eye exam:  Lab Results  Component Value Date/Time   HMDIABEYEEXA Retinopathy (A) 01/28/2020 12:00 AM    Last diabetic Foot exam: No results found for: HMDIABFOOTEX      Component Value Date/Time   CHOL 117 06/21/2021 1620   CHOL 204 (H) 06/21/2013 0931   TRIG 177 (H) 06/21/2021 1620   TRIG 267 (H) 06/21/2013 0931   HDL 55 06/21/2021 1620   HDL 42 06/21/2013 0931   CHOLHDL 2.1 06/21/2021 1620   LDLCALC 33 06/21/2021 1620   LDLCALC 109 (H) 06/21/2013 0931   LDLDIRECT 83 09/17/2014 0943    Hepatic Function Latest Ref Rng & Units 06/21/2021 10/13/2020 09/14/2020  Total Protein 6.0 - 8.5 g/dL 6.1 6.2 5.8(L)  Albumin 3.8 - 4.8 g/dL 4.0 - 3.5(L)  AST 0 -  40 IU/L 16 - 11  ALT 0 - 32 IU/L 13 - 23  Alk Phosphatase 44 - 121 IU/L 86 - 98  Total Bilirubin 0.0 - 1.2 mg/dL 0.3 - <0.2  Bilirubin, Direct 0.00 -  0.40 mg/dL - - -    Lab Results  Component Value Date/Time   TSH 2.010 08/25/2017 12:17 PM   TSH 0.943 07/05/2016 01:00 PM    CBC Latest Ref Rng & Units 06/21/2021 09/14/2020 01/14/2020  WBC 3.4 - 10.8 x10E3/uL 6.5 7.4 6.6  Hemoglobin 11.1 - 15.9 g/dL 13.4 11.9 13.6  Hematocrit 34.0 - 46.6 % 40.0 36.4 40.5  Platelets 150 - 450 x10E3/uL 199 236 198    Lab Results  Component Value Date/Time   VD25OH 17.4 (L) 10/13/2020 09:07 AM   VD25OH 27.2 (L) 08/25/2017 12:17 PM    Clinical ASCVD: No  The ASCVD Risk score (Arnett DK, et al., 2019) failed to calculate for the following reasons:   The valid total cholesterol range is 130 to 320 mg/dL    Other: (CHADS2VASc if Afib, PHQ9 if depression, MMRC or CAT for COPD, ACT, DEXA)  Social History   Tobacco Use  Smoking Status Never  Smokeless Tobacco Never   BP Readings from Last 3 Encounters:  09/23/21 117/68  08/31/21 131/75  06/24/21 110/69   Pulse Readings from Last 3 Encounters:  09/23/21 83  08/31/21 90  06/24/21 83   Wt Readings from Last 3 Encounters:  09/29/21 195 lb (88.5 kg)  09/23/21 195 lb (88.5 kg)  08/31/21 197 lb 12.8 oz (89.7 kg)    Assessment: Review of patient past medical history, allergies, medications, health status, including review of consultants reports, laboratory and other test data, was performed as part of comprehensive evaluation and provision of chronic care management services.   SDOH:  (Social Determinants of Health) assessments and interventions performed:    CCM Care Plan  Allergies  Allergen Reactions   Invokana [Canagliflozin]     vaginitis    Medications Reviewed Today     Reviewed by Lavera Guise, [facility] (Pharmacist) on 10/22/21 at 1033  Med List Status: <None>   Medication Order Taking? Sig Documenting Provider Last Dose Status Informant  ASPIRIN LOW DOSE 81 MG EC tablet [id number] No TAKE 1 TABLET BY MOUTH EVERY DAY Terald Sleeper, PA-C Taking Active   Continuous Blood Gluc  Receiver (DEXCOM G6 RECEIVER) DEVI [id number] No 1 each by Does not apply route 4 (four) times daily. Dettinger, Fransisca Kaufmann, MD Taking Active   Continuous Blood Gluc Sensor (DEXCOM G6 SENSOR) MISC [id number] No 1 each by Does not apply route once a week. Dettinger, Fransisca Kaufmann, MD Taking Active   Continuous Blood Gluc Transmit (DEXCOM G6 TRANSMITTER) MISC [id number] No 1 each by Does not apply route 4 (four) times daily. Dettinger, Fransisca Kaufmann, MD Taking Active   Dulaglutide (TRULICITY) 3 BU/3.8GT Bonney Aid [id number]  Inject 3 mg as directed once a week. Dettinger, Fransisca Kaufmann, MD  Active   DULoxetine (CYMBALTA) 60 MG capsule [id number]  Take 1 capsule (60 mg total) by mouth at bedtime. Dettinger, Fransisca Kaufmann, MD  Active   insulin aspart (NOVOLOG FLEXPEN) 100 UNIT/ML FlexPen [id number]  Inject 3-20 Units into the skin 3 times/day as needed-between meals & bedtime for high blood sugar. Dettinger, Fransisca Kaufmann, MD  Active   Insulin Glargine Solostar (LANTUS) 100 UNIT/ML Solostar Pen [id number]  Inject 20-30 Units into the skin daily. Dettinger, Fransisca Kaufmann, MD  Active   Insulin Pen Needle (  GLOBAL EASE INJECT PEN NEEDLES) 31G X 5 MM MISC [id number] No USE FOUR TIMES DAILY Terald Sleeper, PA-C Taking Active Self  metFORMIN (GLUCOPHAGE) 500 MG tablet [id number] No Take 1 tablet (500 mg total) by mouth 2 (two) times daily with a meal. Dettinger, Fransisca Kaufmann, MD Taking Active   NUCYNTA ER 150 MG TB12 [id number] No Take 1 tablet by mouth every 12 (twelve) hours. [provider] Taking Active Self  OXcarbazepine (TRILEPTAL) 150 MG tablet [id number] No Take 1 twice daily Suzzanne Cloud, NP Taking Active   pregabalin (LYRICA) 150 MG capsule [id number] No 1 in am 2 at night Marcial Pacas, MD Taking Active   rOPINIRole (REQUIP) 1 MG tablet [id number] No Take 1 tablet (1 mg total) by mouth at bedtime. Suzzanne Cloud, NP Taking Active   rosuvastatin (CRESTOR) 5 MG tablet [id number]  Take 1 tablet (5 mg total) by mouth at bedtime. Dettinger, Fransisca Kaufmann, MD   Active   VITAMIN D PO [id number] No Take 1,000 Units by mouth daily. [provider] Taking Active             Patient Active Problem List   Diagnosis Date Noted   Acquired trigger finger of left little finger 01/08/2021   Restless leg syndrome 10/13/2020   Diabetic ulcer of toe of left foot associated with type 2 diabetes mellitus, with fat layer exposed ([place]) 08/10/2019   Bilateral carpal tunnel syndrome 02/25/2019   Closed displaced fracture of body of left calcaneus with routine healing 02/19/2019   Noncompliance 07/17/2018   Major depressive disorder, recurrent episode, moderate ([place]) 02/09/2018   Diabetic peripheral neuropathy ([place]) 11/07/2017   Vitamin B12 deficiency 11/07/2017   DDD (degenerative disc disease), lumbosacral 10/02/2017   Overweight (BMI 25.0-29.9) 10/24/2016   Somnolence, daytime 03/10/2015   Generalized anxiety disorder 06/21/2013   Attention deficit disorder 06/21/2013   Hyperlipidemia 06/21/2013   Restless legs syndrome (RLS) 06/20/2013   Type 2 diabetes mellitus with other specified complication ([place]) 81/09/3158    Immunization History  Administered Date(s) Administered   Fluad Quad(high Dose 65+) 09/23/2021   Influenza, Seasonal, Injecte, Preservative Fre 09/28/2015   Influenza,inj,Quad PF,6+ Mos 09/17/2014, 11/29/2016, 09/29/2017, 10/11/2018, 11/21/2019, 09/14/2020   Influenza-Unspecified 09/10/2013   Pneumococcal Conjugate-13 11/07/2017   Tdap 05/31/2017   Zoster, Live 06/23/2015    Conditions to be addressed/monitored: HLD and DMII  Care Plan : PHARMD MEDICATION MANAGEMENT  Updates made by Lavera Guise, [place] since 11/07/2021 12:00 AM     Problem: DISEASE PROGRESSION PREVENTION      Long-Range Goal: T2DM, HLD   This Visit's Progress: Not on track  Priority: High  Note:   Current Barriers:  Unable to independently afford treatment regimen Unable to achieve control of T2DM   Pharmacist Clinical Goal(s):  patient will  verbalize ability to afford treatment regimen achieve control of T2DM as evidenced by IMPROVED GLYCEMIC CONTROL, GOAL A1C<7% maintain control of T2DM/HLD as evidenced by GOAL A1C<7%, LIPID PANEL GOALS  through collaboration with PharmD and provider.   Interventions: 1:1 collaboration with Dettinger, Fransisca Kaufmann, MD regarding development and update of comprehensive plan of care as evidenced by provider attestation and co-signature Inter-disciplinary care team collaboration (see longitudinal plan of care) Comprehensive medication review performed; medication list updated in electronic medical record  Diabetes: New goal. Uncontrolled-A1C 7.4%; current treatment:BASAGLAR, NOVOLOG, TRULICITY, METFORMIN;  Continue current insulin and metformin Will attempt to increase Trulicity to 4.5OP as tolerated-->samples left in fridge Denies personal and family history of Medullary  thyroid cancer (MTC) Will follow up to increase as tolerated--> may be able to decrease insulin Current glucose readings: fasting glucose: <155, post prandial glucose: <200s  USES DEXCOM G6 CGM (PER INSURANCE) Denies hypoglycemic/hyperglycemic symptoms Discussed meal planning options and Plate method for healthy eating Avoid sugary drinks and desserts Incorporate balanced protein, non starchy veggies, 1 serving of carbohydrate with each meal Increase water intake Increase physical activity as able Current exercise: N/A Assessed patient finances. Patient has dual state health plan commercial and medicare and is therefore not eligible for copay cards from manufacturers.  She is also not eligible for patient assistance programs.  [facility] and her insulin copays are $0 copays per month.  Her insurance prefers [employer] for basal insulin and Novolog for rapid/meal time (corrected and sent in).  Trulicity is $76/HMCNO  Hyperlipidemia:  New goal. Controlled; current treatment:rosuvastatin 59m daily;  LDL currently at goal, however  triglycerides elevated; dietary changes necessary Medications previously tried: n/a  Current dietary patterns: encouraged heart healthy diet Educated on diet/heart healthy options   Patient Goals/Self-Care Activities patient will:  - take medications as prescribed as evidenced by patient report and record review check glucose continuously using CGM, document, and provide at future appointments engage in dietary modifications by following heart healthy diet      Medication Assistance:  [place] IS NOT ELIGIBLE FOR PATIENT ASSISTANCE PROGRAMS  Patient's preferred pharmacy is:  [facility] [place]Phone: [phone]: [phone] Uses pill box? No - NOT NEEDED Pt endorses 100% compliance  Follow Up:  Patient agrees to Care Plan and Follow-up.  Plan: Telephone follow up appointment with care management team member scheduled for:  2 MONTHS   JRegina Eck PharmD, BCPS Clinical Pharmacist, [place] Phone [phone]

## 2021-10-25 ENCOUNTER — Other Ambulatory Visit: Payer: Self-pay | Admitting: Family Medicine

## 2021-10-25 DIAGNOSIS — Z1231 Encounter for screening mammogram for malignant neoplasm of breast: Secondary | ICD-10-CM

## 2021-10-29 ENCOUNTER — Other Ambulatory Visit: Payer: Self-pay | Admitting: Neurology

## 2021-11-02 NOTE — Procedures (Signed)
PATIENT'S NAME:  Rhonda Thomas, Rhonda Thomas DOB:      09/27/1956      MR#:    [id number]     DATE OF RECORDING: 10/20/2021 REFERRING M.D.:  Marcial Pacas, MD Study Performed:   Baseline Polysomnogram HISTORY: 65 year old woman with a history of restless leg syndrome, neuropathy, chronic pain with spinal cord stimulator, ADD, anxiety, depression, diabetes, hiatal hernia, and overweight state, who was diagnosed with moderate obstructive sleep apnea in 2016. She could not tolerate CPAP. She now reports worsening snoring and daytime somnolence. The patient endorsed the Epworth Sleepiness Scale at 18 points. The patient's weight 197 pounds with a height of 69 (inches), resulting in a BMI of 29.1 kg/m2. The patient's neck circumference measured 16.2 inches.  CURRENT MEDICATIONS: Aspirin,Trulicity, Cymbalta, Novolog flexpen, Lantus solostar, Glucophage, Nucynta ER, Trileptal, Lyrica, Crestor, Vitamin D, Requip   PROCEDURE:  This is a multichannel digital polysomnogram utilizing the Somnostar 11.2 system.  Electrodes and sensors were applied and monitored per AASM Specifications.   EEG, EOG, Chin and Limb EMG, were sampled at 200 Hz.  ECG, Snore and Nasal Pressure, Thermal Airflow, Respiratory Effort, CPAP Flow and Pressure, Oximetry was sampled at 50 Hz. Digital video and audio were recorded.      BASELINE STUDY  Lights Out was at 21:53 and Lights On at 05:39.  Total recording time (TRT) was 466.5 minutes, with a total sleep time (TST) of 433 minutes.   The patient's sleep latency was 10 minutes.  REM latency was 69.5 minutes.  The sleep efficiency was 92.8 %.     SLEEP ARCHITECTURE: WASO (Wake after sleep onset) was 24.5 minutes with minimal to mild sleep fragmentation noted. There were 10 minutes in Stage N1, 304.5 minutes Stage N2, 39 minutes Stage N3 and 79.5 minutes in Stage REM.  The percentage of Stage N1 was 2.3%, Stage N2 was 70.3%, which is increased, Stage N3 was 9.% and Stage R (REM sleep) was 18.4%, which is  near-normal. The arousals were noted as: 18 were spontaneous, 6 were associated with PLMs, 10 were associated with respiratory events.  RESPIRATORY ANALYSIS:  There were a total of 46 respiratory events:  17 obstructive apneas, 4 central apneas and 3 mixed apneas with a total of 24 apneas and an apnea index (AI) of 3.3 /hour. There were 22 hypopneas with a hypopnea index of 3. /hour. The patient also had 0 respiratory event related arousals (RERAs).      The total APNEA/HYPOPNEA INDEX (AHI) was 6.4/hour and the total RESPIRATORY DISTURBANCE INDEX was  6.4 /hour.  17 events occurred in REM sleep and 39 events in NREM. The REM AHI was  12.8 /hour, versus a non-REM AHI of 4.9. The patient spent 271 minutes of total sleep time in the supine position and 162 minutes in non-supine.. The supine AHI was 9.8 versus a non-supine AHI of 0.7.  OXYGEN SATURATION & C02:  The Wake baseline 02 saturation was 94%, with the lowest being 82%. Time spent below 89% saturation equaled 9 minutes. PERIODIC LIMB MOVEMENTS:   The patient had a total of 12 Periodic Limb Movements.  The Periodic Limb Movement (PLM) index was 1.7 and the PLM Arousal index was .8/hour.  Audio and video analysis did not show any abnormal or unusual movements, behaviors, phonations or vocalizations. The patient took 1 bathroom break. Mild to moderate snoring was noted. The EKG was in keeping with normal sinus rhythm (NSR).  Post-study, the patient indicated that sleep was better than usual.  IMPRESSION:  Mild Obstructive Sleep Apnea (OSA) Dysfunctions associated with sleep stages or arousal from sleep  RECOMMENDATIONS:  This study demonstrates overall mild obstructive sleep apnea. Given the patient's medical history and sleep related complaints, treatment of her mild sleep apnea is reasonable. Options include weight loss along with avoidance of the supine sleep position. She reports intolerance of positive airway pressure treatment in the  past. Alternative treatment may be in the form of an oral appliance (through a dentist).  Please note that untreated obstructive sleep apnea may carry additional perioperative morbidity. Patients with significant obstructive sleep apnea should receive perioperative PAP therapy and the surgeons and particularly the anesthesiologist should be informed of the diagnosis and the severity of the sleep disordered breathing. This study shows some sleep fragmentation and abnormal sleep stage percentages; these are nonspecific findings and per se do not signify an intrinsic sleep disorder or a cause for the patient's sleep-related symptoms. Causes include (but are not limited to) the first night effect of the sleep study, circadian rhythm disturbances, medication effect or an underlying mood disorder or medical problem.  The patient should be cautioned not to drive, work at heights, or operate dangerous or heavy equipment when tired or sleepy. Review and reiteration of good sleep hygiene measures should be pursued with any patient. The patient will be seen in sleep clinic as necessary. The referring provider will be notified of the test results.  I certify that I have reviewed the entire raw data recording prior to the issuance of this report in accordance with the Standards of Accreditation of the American Academy of Sleep Medicine (AASM)   Star Age, MD, PhD Diplomat, American Board of Neurology and Sleep Medicine (Neurology and Sleep Medicine)

## 2021-11-03 ENCOUNTER — Telehealth: Payer: Self-pay | Admitting: *Deleted

## 2021-11-03 DIAGNOSIS — G4733 Obstructive sleep apnea (adult) (pediatric): Secondary | ICD-10-CM

## 2021-11-03 DIAGNOSIS — Z789 Other specified health status: Secondary | ICD-10-CM

## 2021-11-03 NOTE — Telephone Encounter (Signed)
Spoke with patient and discussed sleep study results as noted below by Dr Rexene Alberts. The patient's question was answered. She prefers to try a dental device and is ok with a [place] location (Dr Ron Parker). Pt verbalized appreciation for the call.   Referral placed.

## 2021-11-03 NOTE — Telephone Encounter (Signed)
-----   Message from Star Age, MD sent at 11/02/2021 12:30 PM EST ----- Patient referred by Dr. Krista Blue, seen by me on 08/31/21 for re-eval of OSA with prior CPAP intolerance. She had a PSG on 10/20/21. Please call and notify the patient that the recent home sleep test showed mild obstructive sleep apnea. Treatment options include an autoPAP, weight loss with avoidance of the back sleep position or an oral appliance. In light of her CPAP intolerance in 2016, she may be a candidate for a dental device and we can make a referral to dentistry for this, if she is agreeable. Please place referral to dentistry for OSA and CPAP intolerance, if she she is interested in pursuing a dental device.  Thanks,   Star Age, MD, PhD [facility])

## 2021-11-03 NOTE — Telephone Encounter (Signed)
Referral ppw for Dr. Kae Heller office placed in nurse pod for MD signature.

## 2021-11-07 NOTE — Patient Instructions (Signed)
Visit Information  Thank you for taking time to visit with me today. Please don't hesitate to contact me if I can be of assistance to you before our next scheduled telephone appointment.  Following are the goals we discussed today:  Current Barriers:  Unable to independently afford treatment regimen Unable to achieve control of T2DM   Pharmacist Clinical Goal(s):  patient will verbalize ability to afford treatment regimen achieve control of T2DM as evidenced by IMPROVED GLYCEMIC CONTROL, GOAL A1C<7% maintain control of T2DM/HLD as evidenced by GOAL A1C<7%, LIPID PANEL GOALS  through collaboration with PharmD and provider.   Interventions: 1:1 collaboration with Dettinger, Fransisca Kaufmann, MD regarding development and update of comprehensive plan of care as evidenced by provider attestation and co-signature Inter-disciplinary care team collaboration (see longitudinal plan of care) Comprehensive medication review performed; medication list updated in electronic medical record  Diabetes: New goal. Uncontrolled-A1C 7.4%; current treatment:BASAGLAR, NOVOLOG, TRULICITY, METFORMIN;  Continue current insulin and metformin Will attempt to increase Trulicity to 4.5mg  as tolerated-->samples left in fridge Denies personal and family history of Medullary thyroid cancer (MTC) Will follow up to increase as tolerated--> may be able to decrease insulin Current glucose readings: fasting glucose: <155, post prandial glucose: <200s  USES DEXCOM G6 CGM (PER INSURANCE) Denies hypoglycemic/hyperglycemic symptoms Discussed meal planning options and Plate method for healthy eating Avoid sugary drinks and desserts Incorporate balanced protein, non starchy veggies, 1 serving of carbohydrate with each meal Increase water intake Increase physical activity as able Current exercise: N/A Assessed patient finances. Patient has dual state health plan commercial and medicare and is therefore not eligible for copay cards  from manufacturers.  She is also not eligible for patient assistance programs.  Called [facility] and her insulin copays are $0 copays per month.  Her insurance prefers [employer], [employer] for basal insulin and Novolog for rapid/meal time (corrected and sent in).  Trulicity is $96/VELFY  Hyperlipidemia:  New goal. Controlled; current treatment:rosuvastatin 5mg  daily;  LDL currently at goal, however triglycerides elevated; dietary changes necessary Medications previously tried: n/a  Current dietary patterns: encouraged heart healthy diet Educated on diet/heart healthy options   Patient Goals/Self-Care Activities patient will:  - take medications as prescribed as evidenced by patient report and record review check glucose continuously using CGM, document, and provide at future appointments engage in dietary modifications by following heart healthy diet  WILL FOLLOW UP IN 2 MONTHS  Please call the care guide team at [phone] if you need to cancel or reschedule your appointment.    The patient verbalized understanding of instructions, educational materials, and care plan provided today and declined offer to receive copy of patient instructions, educational materials, and care plan.   Signature Regina Eck, PharmD, BCPS Clinical Pharmacist, [place]  [facility] Phone [phone]

## 2021-11-09 ENCOUNTER — Other Ambulatory Visit: Payer: Self-pay | Admitting: Family Medicine

## 2021-11-09 DIAGNOSIS — E1049 Type 1 diabetes mellitus with other diabetic neurological complication: Secondary | ICD-10-CM

## 2021-11-09 NOTE — Telephone Encounter (Signed)
Referral was faxed to Dr. Kae Heller office this morning. Phone: [phone].

## 2021-11-10 DIAGNOSIS — E785 Hyperlipidemia, unspecified: Secondary | ICD-10-CM | POA: Diagnosis not present

## 2021-11-10 DIAGNOSIS — E1169 Type 2 diabetes mellitus with other specified complication: Secondary | ICD-10-CM

## 2021-11-10 DIAGNOSIS — Z794 Long term (current) use of insulin: Secondary | ICD-10-CM | POA: Diagnosis not present

## 2021-12-01 ENCOUNTER — Ambulatory Visit (INDEPENDENT_AMBULATORY_CARE_PROVIDER_SITE_OTHER): Payer: HMO | Admitting: Family Medicine

## 2021-12-01 ENCOUNTER — Encounter: Payer: Self-pay | Admitting: Family Medicine

## 2021-12-01 DIAGNOSIS — U071 COVID-19: Secondary | ICD-10-CM | POA: Diagnosis not present

## 2021-12-01 MED ORDER — MOLNUPIRAVIR EUA 200MG CAPSULE: 4.0000 | ORAL_CAPSULE | Freq: Two times a day (BID) | ORAL | 0 refills | Status: AC

## 2021-12-01 NOTE — Progress Notes (Signed)
Virtual Visit via telephone Note Due to COVID-19 pandemic this visit was conducted virtually. This visit type was conducted due to national recommendations for restrictions regarding the COVID-19 Pandemic (e.g. social distancing, sheltering in place) in an effort to limit this patient's exposure and mitigate transmission in our community. All issues noted in this document were discussed and addressed.  A physical exam was not performed with this format.   I connected with Rhonda Thomas on 12/01/2021 at 0815 by telephone and verified that I am speaking with the correct person using two identifiers. Rhonda Thomas is currently located at home and family is currently with them during visit. The provider, Monia Pouch, FNP is located in their office at time of visit.  I discussed the limitations, risks, security and privacy concerns of performing an evaluation and management service by virtual visit and the availability of in person appointments. I also discussed with the patient that there may be a patient responsible charge related to this service. The patient expressed understanding and agreed to proceed.  Subjective:  Patient ID: Rhonda Thomas, female    DOB: 03-Nov-1956, 65 y.o.   MRN: [id number]  Chief Complaint:  Covid Positive   HPI: Rhonda Thomas is a 65 y.o. female presenting on 12/01/2021 for Covid Positive   Pt reports cough, congestion, fever, chills, and malaise since yesterday. She has been taking tylenol without relief of symptoms. States husband tested positive for COVID yesterday. She took a home test this morning and was positive. She denies shortness of breath, chest pain, confusion, weakness, decreased urine output.   URI  This is a new problem. The current episode started yesterday. The maximum temperature recorded prior to her arrival was 101 - 101.9 F. Associated symptoms include congestion, coughing, headaches, rhinorrhea and a sore throat.  Pertinent negatives include no abdominal pain, chest pain, diarrhea, dysuria, ear pain, joint pain, joint swelling, nausea, neck pain, plugged ear sensation, rash, sinus pain, sneezing, swollen glands, vomiting or wheezing. She has tried acetaminophen for the symptoms.    Relevant past medical, surgical, family, and social history reviewed and updated as indicated.  Allergies and medications reviewed and updated.   Past Medical History:  Diagnosis Date   Anxiety    Attention deficit disorder (ADD)    Depression    Diabetes ([place])    Hiatal hernia    Movement disorder    Neuropathy    Schatzki's ring    Sleep apnea     Past Surgical History:  Procedure Laterality Date   ABDOMINAL HYSTERECTOMY     BREAST LUMPECTOMY WITH RADIOACTIVE SEED LOCALIZATION Right 04/25/2016   Procedure: RIGHT BREAST LUMPECTOMY WITH RADIOACTIVE SEED LOCALIZATION;  Surgeon: Autumn Messing III, MD;  Location: [facility];  Service: General;  Laterality: Right;   CHOLECYSTECTOMY     RECTOCELE REPAIR     SPINAL CORD STIMULATOR IMPLANT     TOE AMPUTATION Right    2nd toe rt foot   TUBAL LIGATION      Social History   Socioeconomic History   Marital status: Married    Spouse name: [name]   Number of children: 3   Years of education: college   Highest education level: Not on file  Occupational History   Occupation: Retired    Fish farm manager: [employer]: [place]  Tobacco Use   Smoking status: Never   Smokeless tobacco: Never  Vaping Use   Vaping Use: Never used  Substance and Sexual Activity   Alcohol use: No    Alcohol/week: 0.0 standard drinks   Drug use: Never   Sexual activity: Not on file  Other Topics Concern   Not on file  Social History Narrative   Patient lives at home with her husband. [name]).   Education- College   Right handed.   Caffeine- tea one cup daily.      Social Determinants of Health   Financial Resource Strain: Low Risk     Difficulty of Paying Living Expenses: Not hard at all  Food Insecurity: No Food Insecurity   Worried About Charity fundraiser in the Last Year: Never true   [place] in the Last Year: Never true  Transportation Needs: No Transportation Needs   Lack of Transportation (Medical): No   Lack of Transportation (Non-Medical): No  Physical Activity: Sufficiently Active   Days of Exercise per Week: 5 days   Minutes of Exercise per Session: 30 min  Stress: Stress Concern Present   Feeling of Stress : To some extent  Social Connections: Engineer, building services of Communication with Friends and Family: More than three times a week   Frequency of Social Gatherings with Friends and Family: More than three times a week   Attends Religious Services: 1 to 4 times per year   Active Member of [employer] or Organizations: Yes   Attends Archivist Meetings: 1 to 4 times per year   Marital Status: Married  Human resources officer Violence: Not At Risk   Fear of Current or Ex-Partner: No   Emotionally Abused: No   Physically Abused: No   Sexually Abused: No    Outpatient Encounter Medications as of 12/01/2021  Medication Sig   molnupiravir EUA (LAGEVRIO) 200 mg CAPS capsule Take 4 capsules (800 mg total) by mouth 2 (two) times daily for 5 days.   ASPIRIN LOW DOSE 81 MG EC tablet TAKE 1 TABLET BY MOUTH EVERY DAY   Continuous Blood Gluc Receiver (DEXCOM G6 RECEIVER) DEVI 1 each by Does not apply route 4 (four) times daily.   Continuous Blood Gluc Sensor (DEXCOM G6 SENSOR) MISC 1 each by Does not apply route once a week.   Continuous Blood Gluc Transmit (DEXCOM G6 TRANSMITTER) MISC 1 each by Does not apply route 4 (four) times daily.   Dulaglutide (TRULICITY) 3 CZ/6.6AY SOPN Inject 3 mg as directed once a week.   DULoxetine (CYMBALTA) 60 MG capsule Take 1 capsule (60 mg total) by mouth at bedtime.   insulin aspart (NOVOLOG FLEXPEN) 100 UNIT/ML FlexPen Inject 3-20 Units into the skin 3  times/day as needed-between meals & bedtime for high blood sugar.   Insulin Glargine (BASAGLAR KWIKPEN) 100 UNIT/ML Inject 30 Units into the skin daily.   Insulin Pen Needle (GLOBAL EASE INJECT PEN NEEDLES) 31G X 5 MM MISC USE FOUR TIMES DAILY   metFORMIN (GLUCOPHAGE) 500 MG tablet TAKE 1 TABLET BY MOUTH TWICE DAILY WITH A MEAL   NUCYNTA ER 150 MG TB12 Take 1 tablet by mouth every 12 (twelve) hours.   OXcarbazepine (TRILEPTAL) 150 MG tablet Take 1 twice daily   pregabalin (LYRICA) 150 MG capsule 1 in am 2 at night   rOPINIRole (REQUIP) 1 MG tablet TAKE 1 TABLET BY MOUTH AT BEDTIME   rosuvastatin (CRESTOR) 5 MG tablet Take 1 tablet (5 mg total) by mouth at bedtime.   VITAMIN D PO Take 1,000 Units by mouth daily.   No facility-administered encounter medications  on file as of 12/01/2021.    Allergies  Allergen Reactions   Invokana [Canagliflozin]     vaginitis    Review of Systems  HENT:  Positive for congestion, rhinorrhea and sore throat. Negative for ear pain, sinus pain and sneezing.   Respiratory:  Positive for cough. Negative for wheezing.   Cardiovascular:  Negative for chest pain.  Gastrointestinal:  Negative for abdominal pain, diarrhea, nausea and vomiting.  Genitourinary:  Negative for dysuria.  Musculoskeletal:  Negative for joint pain and neck pain.  Skin:  Negative for rash.  Neurological:  Positive for headaches.        Observations/Objective: No vital signs or physical exam, this was a virtual health encounter.  Pt alert and oriented, answers all questions appropriately, and able to speak in full sentences.    Assessment and Plan: Nikya was seen today for covid positive.  Diagnoses and all orders for this visit:  Positive self-administered antigen test for COVID-19 Has been positive for COVID-19 yesterday.  Patient developed symptoms yesterday evening.  Tested positive at home this morning.  Due to age and comorbidities will start antiviral therapy.  Patient  aware of symptomatic care.  Report any new, worsening, persistent symptoms.  Follow-up as needed. -     molnupiravir EUA (LAGEVRIO) 200 mg CAPS capsule; Take 4 capsules (800 mg total) by mouth 2 (two) times daily for 5 days.     Follow Up Instructions: Return if symptoms worsen or fail to improve.    I discussed the assessment and treatment plan with the patient. The patient was provided an opportunity to ask questions and all were answered. The patient agreed with the plan and demonstrated an understanding of the instructions.   The patient was advised to call back or seek an in-person evaluation if the symptoms worsen or if the condition fails to improve as anticipated.  The above assessment and management plan was discussed with the patient. The patient verbalized understanding of and has agreed to the management plan. Patient is aware to call the clinic if they develop any new symptoms or if symptoms persist or worsen. Patient is aware when to return to the clinic for a follow-up visit. Patient educated on when it is appropriate to go to the emergency department.    I provided 15 minutes of time during this telephone encounter.   Monia Pouch, FNP-C [facility] [address] [place], [place] [address] [phone] 12/01/2021

## 2021-12-08 ENCOUNTER — Inpatient Hospital Stay: Admission: RE | Admit: 2021-12-08 | Payer: HMO | Source: Ambulatory Visit

## 2021-12-09 ENCOUNTER — Encounter: Payer: HMO | Admitting: Psychology

## 2021-12-10 ENCOUNTER — Ambulatory Visit (INDEPENDENT_AMBULATORY_CARE_PROVIDER_SITE_OTHER): Payer: HMO | Admitting: Pharmacist

## 2021-12-10 DIAGNOSIS — E1169 Type 2 diabetes mellitus with other specified complication: Secondary | ICD-10-CM

## 2021-12-10 DIAGNOSIS — E785 Hyperlipidemia, unspecified: Secondary | ICD-10-CM

## 2021-12-10 MED ORDER — TRULICITY 4.5 MG/0.5ML ~~LOC~~ SOAJ: 4.5000 mg | SUBCUTANEOUS | 4 refills | Status: DC

## 2021-12-10 NOTE — Progress Notes (Signed)
Chronic Care Management Pharmacy Note  12/10/2021 Name:  Tomeko Scoville MRN:  [id number] DOB:  1956-07-31  Summary: t2dm  Recommendations/Changes made from today's visit:  Diabetes: Goal on Track (progressing): YES. Uncontrolled-A1C 7.4%; current treatment: BASAGLAR, NOVOLOG, TRULICITY, METFORMIN;  Continue current insulin and metformin INCREASE TO Trulicity to 1.1HE  Denies personal and family history of Medullary thyroid cancer (MTC) Discussed with patient that she may be able to reduce her insulin at meal times and basal if requirements drop with increase of trulicity Current glucose readings: fasting glucose: <155, post prandial glucose: <200s  USES DEXCOM G6 CGM (PER INSURANCE) Denies hypoglycemic/hyperglycemic symptoms Discussed meal planning options and Plate method for healthy eating Avoid sugary drinks and desserts Incorporate balanced protein, non starchy veggies, 1 serving of carbohydrate with each meal Increase water intake Increase physical activity as able Current exercise: N/A Assessed patient finances. Patient has dual state health plan commercial and medicare and is therefore not eligible for copay cards from manufacturers.  She is also not eligible for patient assistance programs.  [facility] and her insulin copays are $0 copays per month.  Her insurance prefers [employer] for basal insulin and Novolog for rapid/meal time (corrected and sent in).  Trulicity is $17/EYCXK  Hyperlipidemia:  Goal on Track (progressing): YES. Controlled; current treatment:rosuvastatin 14m daily;  LDL currently at goal, however triglycerides elevated; dietary changes necessary Medications previously tried: n/a  Current dietary patterns: encouraged heart healthy diet Educated on diet/heart healthy options   Patient Goals/Self-Care Activities patient will:  - take medications as prescribed as evidenced by patient report and record review check glucose continuously using  CGM, document, and provide at future appointments engage in dietary modifications by following heart healthy diet  Plan: 3 months  Subjective: TKona Yusufis an 65y.o. year old female who is a primary patient of Dettinger, JFransisca Kaufmann MD.  The CCM team was consulted for assistance with disease management and care coordination needs.    Engaged with patient by telephone for follow up visit in response to provider referral for pharmacy case management and/or care coordination services.   Consent to Services:  The patient was given information about Chronic Care Management services, agreed to services, and gave verbal consent prior to initiation of services.  Please see initial visit note for detailed documentation.   Patient Care Team: Dettinger, JFransisca Kaufmann MD as PCP - General (Family Medicine) RLucia Bitter, MD as Physician Assistant (Pain Medicine) [facility]Lavera Guise [facility] Pharmacist (Family Medicine)  Objective:  Lab Results  Component Value Date   CREATININE 0.74 06/21/2021   CREATININE 0.83 09/14/2020   CREATININE 0.66 01/14/2020    Lab Results  Component Value Date   HGBA1C 7.4 (H) 09/23/2021   Last diabetic Eye exam:  Lab Results  Component Value Date/Time   HMDIABEYEEXA Retinopathy (A) 01/28/2020 12:00 AM    Last diabetic Foot exam: No results found for: HMDIABFOOTEX      Component Value Date/Time   CHOL 117 06/21/2021 1620   CHOL 204 (H) 06/21/2013 0931   TRIG 177 (H) 06/21/2021 1620   TRIG 267 (H) 06/21/2013 0931   HDL 55 06/21/2021 1620   HDL 42 06/21/2013 0931   CHOLHDL 2.1 06/21/2021 1620   LDLCALC 33 06/21/2021 1620   LDLCALC 109 (H) 06/21/2013 0931   LDLDIRECT 83 09/17/2014 0943    Hepatic Function Latest Ref Rng & Units 06/21/2021 10/13/2020 09/14/2020  Total Protein 6.0 - 8.5 g/dL 6.1 6.2 5.8(L)  Albumin 3.8 - 4.8 g/dL 4.0 - 3.5(L)  AST 0 - 40 IU/L 16 - 11  ALT 0 - 32 IU/L 13 - 23  Alk Phosphatase 44 - 121 IU/L 86 - 98  Total Bilirubin  0.0 - 1.2 mg/dL 0.3 - <0.2  Bilirubin, Direct 0.00 - 0.40 mg/dL - - -    Lab Results  Component Value Date/Time   TSH 2.010 08/25/2017 12:17 PM   TSH 0.943 07/05/2016 01:00 PM    CBC Latest Ref Rng & Units 06/21/2021 09/14/2020 01/14/2020  WBC 3.4 - 10.8 x10E3/uL 6.5 7.4 6.6  Hemoglobin 11.1 - 15.9 g/dL 13.4 11.9 13.6  Hematocrit 34.0 - 46.6 % 40.0 36.4 40.5  Platelets 150 - 450 x10E3/uL 199 236 198    Lab Results  Component Value Date/Time   VD25OH 17.4 (L) 10/13/2020 09:07 AM   VD25OH 27.2 (L) 08/25/2017 12:17 PM    Clinical ASCVD: No  The ASCVD Risk score (Arnett DK, et al., 2019) failed to calculate for the following reasons:   The valid total cholesterol range is 130 to 320 mg/dL    Other: (CHADS2VASc if Afib, PHQ9 if depression, MMRC or CAT for COPD, ACT, DEXA)  Social History   Tobacco Use  Smoking Status Never  Smokeless Tobacco Never   BP Readings from Last 3 Encounters:  09/23/21 117/68  08/31/21 131/75  06/24/21 110/69   Pulse Readings from Last 3 Encounters:  09/23/21 83  08/31/21 90  06/24/21 83   Wt Readings from Last 3 Encounters:  09/29/21 195 lb (88.5 kg)  09/23/21 195 lb (88.5 kg)  08/31/21 197 lb 12.8 oz (89.7 kg)    Assessment: Review of patient past medical history, allergies, medications, health status, including review of consultants reports, laboratory and other test data, was performed as part of comprehensive evaluation and provision of chronic care management services.   SDOH:  (Social Determinants of Health) assessments and interventions performed:    CCM Care Plan  Allergies  Allergen Reactions   Invokana [Canagliflozin]     vaginitis    Medications Reviewed Today     Reviewed by Lavera Guise, [facility] (Pharmacist) on 12/10/21 at [phone]  Med List Status: <None>   Medication Order Taking? Sig Documenting Provider Last Dose Status Informant  ASPIRIN LOW DOSE 81 MG EC tablet [id number] No TAKE 1 TABLET BY MOUTH EVERY DAY Terald Sleeper, PA-C Taking Active   Continuous Blood Gluc Receiver (DEXCOM G6 RECEIVER) DEVI [id number] No 1 each by Does not apply route 4 (four) times daily. Dettinger, Fransisca Kaufmann, MD Taking Active   Continuous Blood Gluc Sensor (DEXCOM G6 SENSOR) MISC [id number] No 1 each by Does not apply route once a week. Dettinger, Fransisca Kaufmann, MD Taking Active   Continuous Blood Gluc Transmit (DEXCOM G6 TRANSMITTER) MISC [id number] No 1 each by Does not apply route 4 (four) times daily. Dettinger, Fransisca Kaufmann, MD Taking Active   Dulaglutide (TRULICITY) 4.5 MW/1.0UV SOPN [id number] Yes Inject 4.5 mg as directed once a week. Dettinger, Fransisca Kaufmann, MD  Active   DULoxetine (CYMBALTA) 60 MG capsule [id number]  Take 1 capsule (60 mg total) by mouth at bedtime. Dettinger, Fransisca Kaufmann, MD  Active   insulin aspart (NOVOLOG FLEXPEN) 100 UNIT/ML FlexPen [id number]  Inject 3-20 Units into the skin 3 times/day as needed-between meals & bedtime for high blood sugar. Dettinger, Fransisca Kaufmann, MD  Active   Insulin [facility] KWIKPEN) 100 UNIT/ML [id number]  Inject 30 Units into the skin daily. Dettinger,  Fransisca Kaufmann, MD  Active   Insulin Pen Needle (GLOBAL EASE INJECT PEN NEEDLES) 31G X 5 MM MISC [id number] No USE FOUR TIMES DAILY Terald Sleeper, PA-C Taking Active Self  metFORMIN (GLUCOPHAGE) 500 MG tablet [id number]  TAKE 1 TABLET BY MOUTH TWICE DAILY WITH A MEAL Dettinger, Fransisca Kaufmann, MD  Active   NUCYNTA ER 150 MG TB12 [id number] No Take 1 tablet by mouth every 12 (twelve) hours. [provider] Taking Active Self  OXcarbazepine (TRILEPTAL) 150 MG tablet [id number] No Take 1 twice daily Suzzanne Cloud, NP Taking Active   pregabalin (LYRICA) 150 MG capsule [id number] No 1 in am 2 at night Marcial Pacas, MD Taking Active   rOPINIRole (REQUIP) 1 MG tablet [id number]  TAKE 1 TABLET BY MOUTH AT BEDTIME Marcial Pacas, MD  Active   rosuvastatin (CRESTOR) 5 MG tablet [id number]  Take 1 tablet (5 mg total) by mouth at bedtime. Dettinger, Fransisca Kaufmann, MD  Active    VITAMIN D PO [id number] No Take 1,000 Units by mouth daily. [provider] Taking Active             Patient Active Problem List   Diagnosis Date Noted   Acquired trigger finger of left little finger 01/08/2021   Restless leg syndrome 10/13/2020   Diabetic ulcer of toe of left foot associated with type 2 diabetes mellitus, with fat layer exposed ([place]) 08/10/2019   Bilateral carpal tunnel syndrome 02/25/2019   Closed displaced fracture of body of left calcaneus with routine healing 02/19/2019   Noncompliance 07/17/2018   Major depressive disorder, recurrent episode, moderate ([place]) 02/09/2018   Diabetic peripheral neuropathy ([place]) 11/07/2017   Vitamin B12 deficiency 11/07/2017   DDD (degenerative disc disease), lumbosacral 10/02/2017   Overweight (BMI 25.0-29.9) 10/24/2016   Somnolence, daytime 03/10/2015   Generalized anxiety disorder 06/21/2013   Attention deficit disorder 06/21/2013   Hyperlipidemia 06/21/2013   Restless legs syndrome (RLS) 06/20/2013   Type 2 diabetes mellitus with other specified complication ([place]) 75/64/3329    Immunization History  Administered Date(s) Administered   Fluad Quad(high Dose 65+) 09/23/2021   Influenza, Seasonal, Injecte, Preservative Fre 09/28/2015   Influenza,inj,Quad PF,6+ Mos 09/17/2014, 11/29/2016, 09/29/2017, 10/11/2018, 11/21/2019, 09/14/2020   Influenza-Unspecified 09/10/2013   Pneumococcal Conjugate-13 11/07/2017   Tdap 05/31/2017   Zoster, Live 06/23/2015    Conditions to be addressed/monitored: HLD and DMII  Care Plan : PHARMD MEDICATION MANAGEMENT  Updates made by Lavera Guise, [place] since 12/10/2021 12:00 AM     Problem: DISEASE PROGRESSION PREVENTION      Long-Range Goal: T2DM, HLD   Recent Progress: Not on track  Priority: High  Note:   Current Barriers:  Unable to independently afford treatment regimen Unable to achieve control of T2DM   Pharmacist Clinical Goal(s):  patient will verbalize  ability to afford treatment regimen achieve control of T2DM as evidenced by IMPROVED GLYCEMIC CONTROL, GOAL A1C<7% maintain control of T2DM/HLD as evidenced by GOAL A1C<7%, LIPID PANEL GOALS  through collaboration with PharmD and provider.   Interventions: 1:1 collaboration with Dettinger, Fransisca Kaufmann, MD regarding development and update of comprehensive plan of care as evidenced by provider attestation and co-signature Inter-disciplinary care team collaboration (see longitudinal plan of care) Comprehensive medication review performed; medication list updated in electronic medical record  Diabetes: Goal on Track (progressing): YES. Uncontrolled-A1C 7.4%; current treatment: BASAGLAR, NOVOLOG, TRULICITY, METFORMIN;  Continue current insulin and metformin INCREASE TO Trulicity to 5.1OA  Denies personal and family history of Medullary thyroid cancer (  MTC) Discussed with patient that she may be able to reduce her insulin at meal times and basal if requirements drop with increase of trulicity Current glucose readings: fasting glucose: <155, post prandial glucose: <200s  USES DEXCOM G6 CGM (PER INSURANCE) Denies hypoglycemic/hyperglycemic symptoms Discussed meal planning options and Plate method for healthy eating Avoid sugary drinks and desserts Incorporate balanced protein, non starchy veggies, 1 serving of carbohydrate with each meal Increase water intake Increase physical activity as able Current exercise: N/A Assessed patient finances. Patient has dual state health plan commercial and medicare and is therefore not eligible for copay cards from manufacturers.  She is also not eligible for patient assistance programs.  [facility] and her insulin copays are $0 copays per month.  Her insurance prefers [employer] for basal insulin and Novolog for rapid/meal time (corrected and sent in).  Trulicity is $56/FBPPH  Hyperlipidemia:  Goal on Track (progressing): YES. Controlled; current  treatment:rosuvastatin 19m daily;  LDL currently at goal, however triglycerides elevated; dietary changes necessary Medications previously tried: n/a  Current dietary patterns: encouraged heart healthy diet Educated on diet/heart healthy options   Patient Goals/Self-Care Activities patient will:  - take medications as prescribed as evidenced by patient report and record review check glucose continuously using CGM, document, and provide at future appointments engage in dietary modifications by following heart healthy diet      Medication Assistance: None required.  Patient affirms current coverage meets needs.  [facility] [place]Phone: [phone]: [phone] Uses pill box? No - n/a Pt endorses 100% compliance  Follow Up:  Patient agrees to Care Plan and Follow-up.  Plan: Telephone follow up appointment with care management team member scheduled for:  3 months   JRegina Eck PharmD, BCPS Clinical Pharmacist, [place] Phone [phone]

## 2021-12-10 NOTE — Patient Instructions (Signed)
Visit Information  Thank you for taking time to visit with me today. Please don't hesitate to contact me if I can be of assistance to you before our next scheduled telephone appointment.  Following are the goals we discussed today:  Current Barriers:  Unable to independently afford treatment regimen Unable to achieve control of T2DM   Pharmacist Clinical Goal(s):  patient will verbalize ability to afford treatment regimen achieve control of T2DM as evidenced by IMPROVED GLYCEMIC CONTROL, GOAL A1C<7% maintain control of T2DM/HLD as evidenced by GOAL A1C<7%, LIPID PANEL GOALS  through collaboration with PharmD and provider.   Interventions: 1:1 collaboration with Dettinger, Fransisca Kaufmann, MD regarding development and update of comprehensive plan of care as evidenced by provider attestation and co-signature Inter-disciplinary care team collaboration (see longitudinal plan of care) Comprehensive medication review performed; medication list updated in electronic medical record  Diabetes: Goal on Track (progressing): YES. Uncontrolled-A1C 7.4%; current treatment: BASAGLAR, NOVOLOG, TRULICITY, METFORMIN;  Continue current insulin and metformin INCREASE TO Trulicity to 4.5mg   Denies personal and family history of Medullary thyroid cancer (MTC) Discussed with patient that she may be able to reduce her insulin at meal times and basal if requirements drop with increase of trulicity Current glucose readings: fasting glucose: <155, post prandial glucose: <200s  USES DEXCOM G6 CGM (PER INSURANCE) Denies hypoglycemic/hyperglycemic symptoms Discussed meal planning options and Plate method for healthy eating Avoid sugary drinks and desserts Incorporate balanced protein, non starchy veggies, 1 serving of carbohydrate with each meal Increase water intake Increase physical activity as able Current exercise: N/A Assessed patient finances. Patient has dual state health plan commercial and medicare and is  therefore not eligible for copay cards from manufacturers.  She is also not eligible for patient assistance programs.  [facility] and her insulin copays are $0 copays per month.  Her insurance prefers [employer] for basal insulin and Novolog for rapid/meal time (corrected and sent in).  Trulicity is $15/BWIOM  Hyperlipidemia:  Goal on Track (progressing): YES. Controlled; current treatment:rosuvastatin 5mg  daily;  LDL currently at goal, however triglycerides elevated; dietary changes necessary Medications previously tried: n/a  Current dietary patterns: encouraged heart healthy diet Educated on diet/heart healthy options   Patient Goals/Self-Care Activities patient will:  - take medications as prescribed as evidenced by patient report and record review check glucose continuously using CGM, document, and provide at future appointments engage in dietary modifications by following heart healthy diet   Please call the care guide team at [phone] if you need to cancel or reschedule your appointment.   The patient verbalized understanding of instructions, educational materials, and care plan provided today and declined offer to receive copy of patient instructions, educational materials, and care plan.    Regina Eck, PharmD, BCPS Clinical Pharmacist, [place]  [facility] Phone [phone]

## 2021-12-11 DIAGNOSIS — Z794 Long term (current) use of insulin: Secondary | ICD-10-CM

## 2021-12-11 DIAGNOSIS — E785 Hyperlipidemia, unspecified: Secondary | ICD-10-CM

## 2021-12-11 DIAGNOSIS — E1169 Type 2 diabetes mellitus with other specified complication: Secondary | ICD-10-CM | POA: Diagnosis not present

## 2022-01-06 ENCOUNTER — Ambulatory Visit: Payer: HMO | Admitting: Psychology

## 2022-01-17 ENCOUNTER — Ambulatory Visit (INDEPENDENT_AMBULATORY_CARE_PROVIDER_SITE_OTHER): Payer: HMO | Admitting: Family

## 2022-01-17 ENCOUNTER — Encounter: Payer: Self-pay | Admitting: Family

## 2022-01-17 DIAGNOSIS — R399 Unspecified symptoms and signs involving the genitourinary system: Secondary | ICD-10-CM | POA: Diagnosis not present

## 2022-01-17 MED ORDER — CEPHALEXIN 500 MG PO CAPS: 500.0000 mg | ORAL_CAPSULE | Freq: Two times a day (BID) | ORAL | 0 refills | Status: DC

## 2022-01-17 NOTE — Patient Instructions (Signed)
Urinary Tract Infection, Adult A urinary tract infection (UTI) is an infection of any part of the urinary tract. The urinary tract includes the kidneys, ureters, bladder, and urethra. These organs make, store, and get rid of urine in the body. An upper UTI affects the ureters and kidneys. A lower UTI affects the bladder and urethra. What are the causes? Most urinary tract infections are caused by bacteria in your genital area around your urethra, where urine leaves your body. These bacteria grow and cause inflammation of your urinary tract. What increases the risk? You are more likely to develop this condition if: You have a urinary catheter that stays in place. You are not able to control when you urinate or have a bowel movement (incontinence). You are female and you: Use a spermicide or diaphragm for birth control. Have low estrogen levels. Are pregnant. You have certain genes that increase your risk. You are sexually active. You take antibiotic medicines. You have a condition that causes your flow of urine to slow down, such as: An enlarged prostate, if you are female. Blockage in your urethra. A kidney stone. A nerve condition that affects your bladder control (neurogenic bladder). Not getting enough to drink, or not urinating often. You have certain medical conditions, such as: Diabetes. A weak disease-fighting system (immunesystem). Sickle cell disease. Gout. Spinal cord injury. What are the signs or symptoms? Symptoms of this condition include: Needing to urinate right away (urgency). Frequent urination. This may include small amounts of urine each time you urinate. Pain or burning with urination. Blood in the urine. Urine that smells bad or unusual. Trouble urinating. Cloudy urine. Vaginal discharge, if you are female. Pain in the abdomen or the lower back. You may also have: Vomiting or a decreased appetite. Confusion. Irritability or tiredness. A fever or  chills. Diarrhea. The first symptom in older adults may be confusion. In some cases, they may not have any symptoms until the infection has worsened. How is this diagnosed? This condition is diagnosed based on your medical history and a physical exam. You may also have other tests, including: Urine tests. Blood tests. Tests for STIs (sexually transmitted infections). If you have had more than one UTI, a cystoscopy or imaging studies may be done to determine the cause of the infections. How is this treated? Treatment for this condition includes: Antibiotic medicine. Over-the-counter medicines to treat discomfort. Drinking enough water to stay hydrated. If you have frequent infections or have other conditions such as a kidney stone, you may need to see a health care provider who specializes in the urinary tract (urologist). In rare cases, urinary tract infections can cause sepsis. Sepsis is a life-threatening condition that occurs when the body responds to an infection. Sepsis is treated in the hospital with IV antibiotics, fluids, and other medicines. Follow these instructions at home: Medicines Take over-the-counter and prescription medicines only as told by your health care provider. If you were prescribed an antibiotic medicine, take it as told by your health care provider. Do not stop using the antibiotic even if you start to feel better. General instructions Make sure you: Empty your bladder often and completely. Do not hold urine for long periods of time. Empty your bladder after sex. Wipe from front to back after urinating or having a bowel movement if you are female. Use each tissue only one time when you wipe. Drink enough fluid to keep your urine pale yellow. Keep all follow-up visits. This is important. Contact a health care provider   if: Your symptoms do not get better after 1-2 days. Your symptoms go away and then return. Get help right away if: You have severe pain in your  back or your lower abdomen. You have a fever or chills. You have nausea or vomiting. Summary A urinary tract infection (UTI) is an infection of any part of the urinary tract, which includes the kidneys, ureters, bladder, and urethra. Most urinary tract infections are caused by bacteria in your genital area. Treatment for this condition often includes antibiotic medicines. If you were prescribed an antibiotic medicine, take it as told by your health care provider. Do not stop using the antibiotic even if you start to feel better. Keep all follow-up visits. This is important. This information is not intended to replace advice given to you by your health care provider. Make sure you discuss any questions you have with your health care provider. Document Revised: 07/10/2020 Document Reviewed: 07/10/2020 Elsevier Patient Education  2022 [employer]  

## 2022-01-17 NOTE — Progress Notes (Signed)
Virtual Visit  Note Due to COVID-19 pandemic this visit was conducted virtually. This visit type was conducted due to national recommendations for restrictions regarding the COVID-19 Pandemic (e.g. social distancing, sheltering in place) in an effort to limit this patient's exposure and mitigate transmission in our community. All issues noted in this document were discussed and addressed.  A physical exam was not performed with this format.  I connected with Rhonda Thomas on 01/17/22 at 1:16 pm  by telephone and verified that I am speaking with the correct person using two identifiers. Rhonda Thomas is currently located at store and no one  is currently with her during visit. The provider, Evelina Dun, FNP is located in their office at time of visit.  I discussed the limitations, risks, security and privacy concerns of performing an evaluation and management service by telephone and the availability of in person appointments. I also discussed with the patient that there may be a patient responsible charge related to this service. The patient expressed understanding and agreed to proceed.  Rhonda Thomas, Rhonda Thomas are scheduled for a virtual visit with your provider today.    Just as we do with appointments in the office, we must obtain your consent to participate.  Your consent will be active for this visit and any virtual visit you may have with one of our providers in the next 365 days.    If you have a MyChart account, I can also send a copy of this consent to you electronically.  All virtual visits are billed to your insurance company just like a traditional visit in the office.  As this is a virtual visit, video technology does not allow for your provider to perform a traditional examination.  This may limit your provider's ability to fully assess your condition.  If your provider identifies any concerns that need to be evaluated in person or the need to arrange testing such as labs, EKG,  etc, we will make arrangements to do so.    Although advances in technology are sophisticated, we cannot ensure that it will always work on either your end or our end.  If the connection with a video visit is poor, we may have to switch to a telephone visit.  With either a video or telephone visit, we are not always able to ensure that we have a secure connection.   I need to obtain your verbal consent now.   Are you willing to proceed with your visit today?   Rhonda Thomas has provided verbal consent on 01/17/2022 for a virtual visit (video or telephone).   Evelina Dun, [place] 01/17/2022  1:13 PM    History and Present Illness:  Urinary Frequency  This is a new problem. The current episode started yesterday. The problem occurs intermittently. The quality of the pain is described as burning. The pain is at a severity of 5/10. The pain is mild. Associated symptoms include frequency and urgency. Pertinent negatives include no hematuria, nausea or vomiting. She has tried increased fluids for the symptoms. The treatment provided mild relief.     Review of Systems  Gastrointestinal:  Negative for nausea and vomiting.  Genitourinary:  Positive for frequency and urgency. Negative for hematuria.    Observations/Objective: No SOB or distress noted   Assessment and Plan: 1. UTI symptoms Force fluids AZO over the counter X2 days RTO if symptoms worsen or do not improve  - cephALEXin (KEFLEX) 500 MG capsule; Take 1 capsule (500 mg total)  by mouth 2 (two) times daily.  Dispense: 14 capsule; Refill: 0    I discussed the assessment and treatment plan with the patient. The patient was provided an opportunity to ask questions and all were answered. The patient agreed with the plan and demonstrated an understanding of the instructions.   The patient was advised to call back or seek an in-person evaluation if the symptoms worsen or if the condition fails to improve as anticipated.  The above  assessment and management plan was discussed with the patient. The patient verbalized understanding of and has agreed to the management plan. Patient is aware to call the clinic if symptoms persist or worsen. Patient is aware when to return to the clinic for a follow-up visit. Patient educated on when it is appropriate to go to the emergency department.   Time call ended:  1:27 pm   I provided 11 minutes of  non face-to-face time during this encounter.    Evelina Dun, FNP

## 2022-01-19 ENCOUNTER — Other Ambulatory Visit: Payer: Self-pay

## 2022-01-19 ENCOUNTER — Encounter: Payer: HMO | Attending: Psychology | Admitting: Psychology

## 2022-01-19 DIAGNOSIS — F908 Attention-deficit hyperactivity disorder, other type: Secondary | ICD-10-CM | POA: Diagnosis not present

## 2022-01-19 DIAGNOSIS — F331 Major depressive disorder, recurrent, moderate: Secondary | ICD-10-CM | POA: Diagnosis not present

## 2022-01-19 DIAGNOSIS — F411 Generalized anxiety disorder: Secondary | ICD-10-CM | POA: Diagnosis not present

## 2022-01-19 DIAGNOSIS — E1142 Type 2 diabetes mellitus with diabetic polyneuropathy: Secondary | ICD-10-CM | POA: Diagnosis not present

## 2022-01-20 ENCOUNTER — Encounter: Payer: Self-pay | Admitting: Psychology

## 2022-01-20 NOTE — Progress Notes (Signed)
Patient ID: Rhonda Thomas, female   DOB: 12-24-1955, 66 y.o.   MRN: [id number] [name]   DOB: [birth year]  MR Number: [id number]  Location: [facility] [place], [place] [id number] [place] [id number] Dept: [phone]  Start: 11 AM End: 12 PM  Today's visit was an in person visit that lasted for 1 hour and was conducted in my outpatient clinic office with the patient myself present.  Provider/Observer:     Edgardo Roys PsyD  Chief Complaint:      Chief Complaint  Patient presents with   Anxiety   Depression   Sleeping Problem   Stress     Reason For Service:     I have worked with the patient for several years now. She was initially referred here for difficulty coping with the situation involving her children. Her youngest son is at severe difficulties over the years with regard to mood disorder, substance abuse and behavioral problems. Now there are major stressors associated with him again the patient has been essentially overwhelmed and unsure about what to do about. She reports that this has created a lot of stress both at home and at work. On top of that, her diabetes has gotten to the point that she had hammertoe amputated because of that. She has neuropathy as a result of diabetes as well and has had to stay out of her foot for 8 weeks.  The psychosocial stressors are having significant negative impact upon her severe diabetes with neuropathy and loss of toe.  The patient continues to have recurrent issues with residual effects of her type 2 diabetes with significant medical complications.  The reason for service has been reviewed and remains applicable for the current visit.  The patient reports that she is still dealing with anxiety and depressive type symptomatology and continues to take her psychotropic medications including Cymbalta,  Trileptal and also taking Lyrica for peripheral neuropathy.   Interventions Strategy:  Cognitive/behavioral therapeutic interventions and working on issues related to stress and her brittle diabetes/pain issues.  The patient has continued to struggle with significant sleep difficulties and we also worked on sleep hygiene issues.  The patient previously been diagnosed with sleep apnea and prescribed CPAP but she developed ear difficulties that were associated with the CPAP device and is no longer able to use it.  The patient recently had a consult with Dr. Rexene Alberts regarding assessment for obstructive sleep apnea and she is being scheduled for formal sleep study and assessment of whether she would qualify for the inspire implanted device for her sleep apnea.  Participation Level:   Active  Participation Quality:  Appropriate      Behavioral Observation:  Well Groomed, Alert, and Appropriate.   Current Psychosocial Factors:   The patient reports that her neighbor who is described with symptoms consistent with paranoid personality disorder continues to cause a great deal of stress for her and her family.  The neighbors put up cameras facing in the patient's front door and constantly watches the patient.  The neighbor has called the police multiple times for complaints and whenever the patient tries to get to her mailbox the neighbor will put trash cans and other things to block her.  The neighbor does not understand easement's and another aspects of living in the neighborhood.  The neighbor had caused a great deal of trouble when her son was living  at the house and is causing difficulties for other neighbors as well.  I suggested that the patient talk to the magistrate about what can be done as these practices virginal and stalking/harassment and are having a negative impact on the patient's psychological wellbeing.  Content of Session:   Reviewed current symptoms and continue to work on coping skills in  dealing with both her medical issues as well as the stressors.  Current Status:   The patient reports that she has been able to finish her spinal cord stimulator surgery and reports that she is experiencing a 50-60% improvement in her pain levels.     Last Reviewed:   01/19/2021  Goals Addressed Today:    Goals addressed included building better coping skills.  Impression/Diagnosis:   The patient has a long history of attention deficit disorder but due to Maj. psychosocial stressors she also developed clinical depression and anxiety. I think that her attentional problems were valid prior to the development of these issues that existed her entire life. However, she is in and repeat if and recurrent trauma with regard primarily to her son and to her daughter to a lesser degree in years past.     Edgardo Roys, PsyD 01/20/2022

## 2022-01-27 ENCOUNTER — Telehealth: Payer: HMO

## 2022-01-27 ENCOUNTER — Telehealth: Payer: Self-pay | Admitting: Pharmacist

## 2022-01-27 NOTE — Telephone Encounter (Signed)
°  Care Management   Follow Up Note   01/27/2022 Name: Rhonda Thomas MRN: [id number] DOB: [birth year]   Referred by: Dettinger, Fransisca Kaufmann, MD Reason for referral : Appointment (Unsuccesful outreach)   An unsuccessful telephone outreach was attempted today. The patient was referred to the case management team for assistance with care management and care coordination.   Follow Up Plan: will await patient return call  SIGNATURE  Regina Eck, PharmD, BCPS Clinical Pharmacist, [place]  II Phone [phone]

## 2022-02-02 ENCOUNTER — Other Ambulatory Visit: Payer: Self-pay

## 2022-02-02 ENCOUNTER — Ambulatory Visit
Admission: RE | Admit: 2022-02-02 | Discharge: 2022-02-02 | Disposition: A | Discharge status: Home / Self Care | Payer: HMO | Source: Ambulatory Visit | Attending: Family Medicine | Admitting: Family Medicine

## 2022-02-02 ENCOUNTER — Ambulatory Visit (INDEPENDENT_AMBULATORY_CARE_PROVIDER_SITE_OTHER): Payer: HMO

## 2022-02-02 DIAGNOSIS — Z780 Asymptomatic menopausal state: Secondary | ICD-10-CM | POA: Diagnosis not present

## 2022-02-02 DIAGNOSIS — Z1231 Encounter for screening mammogram for malignant neoplasm of breast: Secondary | ICD-10-CM

## 2022-02-03 DIAGNOSIS — M86171 Other acute osteomyelitis, right ankle and foot: Secondary | ICD-10-CM | POA: Diagnosis not present

## 2022-02-03 DIAGNOSIS — M86172 Other acute osteomyelitis, left ankle and foot: Secondary | ICD-10-CM | POA: Diagnosis not present

## 2022-02-09 ENCOUNTER — Telehealth: Payer: Self-pay | Admitting: Family Medicine

## 2022-02-17 ENCOUNTER — Encounter: Payer: HMO | Attending: Psychology | Admitting: Psychology

## 2022-02-17 ENCOUNTER — Other Ambulatory Visit: Payer: Self-pay

## 2022-02-17 DIAGNOSIS — F331 Major depressive disorder, recurrent, moderate: Secondary | ICD-10-CM | POA: Insufficient documentation

## 2022-02-17 DIAGNOSIS — E1142 Type 2 diabetes mellitus with diabetic polyneuropathy: Secondary | ICD-10-CM | POA: Diagnosis not present

## 2022-02-17 DIAGNOSIS — F411 Generalized anxiety disorder: Secondary | ICD-10-CM | POA: Insufficient documentation

## 2022-02-22 ENCOUNTER — Encounter: Payer: Self-pay | Admitting: Psychology

## 2022-02-22 DIAGNOSIS — M86171 Other acute osteomyelitis, right ankle and foot: Secondary | ICD-10-CM | POA: Diagnosis not present

## 2022-02-22 NOTE — Progress Notes (Signed)
Patient ID: Rhonda Thomas, female   DOB: 03-Apr-1956, 66 y.o.   MRN: [id number] ?[name]  ? ?DOB: 09/17/1956 ? ?MR Number: [id number] ? ?Location: [facility] ?[place] [place] [id number] ?[place] [address] ?Dept: [phone] ? ?Start: 10 AM ?End: 11 AM ? ?Today's visit was an in person visit that lasted for 1 hour and was conducted in my outpatient clinic office with the patient myself present. ? ?Provider/Observer:     Edgardo Roys PsyD ? ?Chief Complaint:      ?Chief Complaint  ?Patient presents with  ? Pain  ? Anxiety  ? Depression  ? ADHD  ? ? ? ?Reason For Service:     I have worked with the patient for several years now. She was initially referred here for difficulty coping with the situation involving her children. Her youngest son is at severe difficulties over the years with regard to mood disorder, substance abuse and behavioral problems. Now there are major stressors associated with him again the patient has been essentially overwhelmed and unsure about what to do about. She reports that this has created a lot of stress both at home and at work. On top of that, her diabetes has gotten to the point that she had hammertoe amputated because of that. She has neuropathy as a result of diabetes as well and has had to stay out of her foot for 8 weeks.  The psychosocial stressors are having significant negative impact upon her severe diabetes with neuropathy and loss of toe.  The patient continues to have recurrent issues with residual effects of her type 2 diabetes with significant medical complications. ? ?The reason for service has been reviewed and remains applicable for the current visit.  The patient reports that she is still dealing with anxiety and depressive type symptomatology and continues to take her psychotropic medications including Cymbalta, Trileptal and  also taking Lyrica for peripheral neuropathy. ? ? ?Interventions Strategy:  Cognitive/behavioral therapeutic interventions and working on issues related to stress and her brittle diabetes/pain issues.  The patient has continued to struggle with significant sleep difficulties and we also worked on sleep hygiene issues.  The patient previously been diagnosed with sleep apnea and prescribed CPAP but she developed ear difficulties that were associated with the CPAP device and is no longer able to use it.  The patient recently had a consult with Dr. Rexene Alberts regarding assessment for obstructive sleep apnea and she is being scheduled for formal sleep study and assessment of whether she would qualify for the inspire implanted device for her sleep apnea. ? ?Participation Level:   Active ? ?Participation Quality:  Appropriate   ?   ?Behavioral Observation:  Well Groomed, Alert, and Appropriate.  ? ?Current Psychosocial Factors:   The patient reports that she has continued to struggle with the behaviors of a neighbor who essentially is stalking her and harassing her and her family.  The patient has done some of the things we talked about as far as finding out the legal status of plot lines and things that the patient's neighbor is harassing about and confirm the patient's legal status.  She also found out when talking with the clerk of court that the neighbor had tried to manipulate the situation and was told no and she is hoping that the neighbor will quiet down some of the challenges and manipulation she is engaged in.  The patient has not gone to the magistrate yet but that is the plan of the inappropriate behaviors become more exacerbated. ? ?Content of Session:   Reviewed current symptoms and continue to work on coping skills in dealing with both her medical issues as well as the stressors. ? ?Current Status:   The patient reports that she has been able to finish her spinal cord stimulator surgery and reports that she is  experiencing a 50-60% improvement in her pain levels.    ? ?Last Reviewed:   02/17/2022 ? ?Goals Addressed Today:    Goals addressed included building better coping skills. ? ?Impression/Diagnosis:   The patient has a long history of attention deficit disorder but due to Maj. psychosocial stressors she also developed clinical depression and anxiety. I think that her attentional problems were valid prior to the development of these issues that existed her entire life. However, she is in and repeat if and recurrent trauma with regard primarily to her son and to her daughter to a lesser degree in years past. ? ? ? ? ?Edgardo Roys, PsyD ?02/22/2022 ?

## 2022-02-24 ENCOUNTER — Ambulatory Visit: Payer: HMO | Admitting: Neurology

## 2022-02-24 DIAGNOSIS — L97519 Non-pressure chronic ulcer of other part of right foot with unspecified severity: Secondary | ICD-10-CM | POA: Diagnosis not present

## 2022-02-24 DIAGNOSIS — Z794 Long term (current) use of insulin: Secondary | ICD-10-CM | POA: Diagnosis not present

## 2022-02-24 DIAGNOSIS — Z79899 Other long term (current) drug therapy: Secondary | ICD-10-CM | POA: Diagnosis not present

## 2022-02-24 DIAGNOSIS — M86171 Other acute osteomyelitis, right ankle and foot: Secondary | ICD-10-CM | POA: Diagnosis not present

## 2022-02-24 DIAGNOSIS — M86172 Other acute osteomyelitis, left ankle and foot: Secondary | ICD-10-CM | POA: Diagnosis not present

## 2022-02-24 DIAGNOSIS — I10 Essential (primary) hypertension: Secondary | ICD-10-CM | POA: Diagnosis not present

## 2022-02-24 DIAGNOSIS — Z7984 Long term (current) use of oral hypoglycemic drugs: Secondary | ICD-10-CM | POA: Diagnosis not present

## 2022-02-24 DIAGNOSIS — E1142 Type 2 diabetes mellitus with diabetic polyneuropathy: Secondary | ICD-10-CM | POA: Diagnosis not present

## 2022-02-24 DIAGNOSIS — Z7982 Long term (current) use of aspirin: Secondary | ICD-10-CM | POA: Diagnosis not present

## 2022-02-24 DIAGNOSIS — E11621 Type 2 diabetes mellitus with foot ulcer: Secondary | ICD-10-CM | POA: Diagnosis not present

## 2022-02-24 DIAGNOSIS — L97529 Non-pressure chronic ulcer of other part of left foot with unspecified severity: Secondary | ICD-10-CM | POA: Diagnosis not present

## 2022-02-24 DIAGNOSIS — M868X7 Other osteomyelitis, ankle and foot: Secondary | ICD-10-CM | POA: Diagnosis not present

## 2022-02-24 DIAGNOSIS — E1169 Type 2 diabetes mellitus with other specified complication: Secondary | ICD-10-CM | POA: Diagnosis not present

## 2022-02-27 ENCOUNTER — Other Ambulatory Visit: Payer: Self-pay | Admitting: Neurology

## 2022-03-09 DIAGNOSIS — M79672 Pain in left foot: Secondary | ICD-10-CM | POA: Diagnosis not present

## 2022-03-09 DIAGNOSIS — M79671 Pain in right foot: Secondary | ICD-10-CM | POA: Diagnosis not present

## 2022-03-09 DIAGNOSIS — G2581 Restless legs syndrome: Secondary | ICD-10-CM | POA: Diagnosis not present

## 2022-03-09 DIAGNOSIS — G894 Chronic pain syndrome: Secondary | ICD-10-CM | POA: Diagnosis not present

## 2022-03-09 DIAGNOSIS — E1140 Type 2 diabetes mellitus with diabetic neuropathy, unspecified: Secondary | ICD-10-CM | POA: Diagnosis not present

## 2022-04-07 DIAGNOSIS — M79671 Pain in right foot: Secondary | ICD-10-CM | POA: Diagnosis not present

## 2022-04-07 DIAGNOSIS — M79672 Pain in left foot: Secondary | ICD-10-CM | POA: Diagnosis not present

## 2022-04-07 DIAGNOSIS — G2581 Restless legs syndrome: Secondary | ICD-10-CM | POA: Diagnosis not present

## 2022-04-07 DIAGNOSIS — E1140 Type 2 diabetes mellitus with diabetic neuropathy, unspecified: Secondary | ICD-10-CM | POA: Diagnosis not present

## 2022-04-07 DIAGNOSIS — G894 Chronic pain syndrome: Secondary | ICD-10-CM | POA: Diagnosis not present

## 2022-04-26 DIAGNOSIS — M79671 Pain in right foot: Secondary | ICD-10-CM | POA: Diagnosis not present

## 2022-04-26 DIAGNOSIS — G2581 Restless legs syndrome: Secondary | ICD-10-CM | POA: Diagnosis not present

## 2022-04-26 DIAGNOSIS — M79672 Pain in left foot: Secondary | ICD-10-CM | POA: Diagnosis not present

## 2022-04-26 DIAGNOSIS — G894 Chronic pain syndrome: Secondary | ICD-10-CM | POA: Diagnosis not present

## 2022-04-26 DIAGNOSIS — E1140 Type 2 diabetes mellitus with diabetic neuropathy, unspecified: Secondary | ICD-10-CM | POA: Diagnosis not present

## 2022-04-28 ENCOUNTER — Encounter: Payer: HMO | Attending: Psychology | Admitting: Psychology

## 2022-04-28 DIAGNOSIS — E1142 Type 2 diabetes mellitus with diabetic polyneuropathy: Secondary | ICD-10-CM

## 2022-04-28 DIAGNOSIS — F331 Major depressive disorder, recurrent, moderate: Secondary | ICD-10-CM

## 2022-04-28 DIAGNOSIS — F411 Generalized anxiety disorder: Secondary | ICD-10-CM

## 2022-05-02 ENCOUNTER — Encounter: Payer: Self-pay | Admitting: Nurse Practitioner

## 2022-05-02 ENCOUNTER — Ambulatory Visit (INDEPENDENT_AMBULATORY_CARE_PROVIDER_SITE_OTHER): Payer: HMO | Admitting: Nurse Practitioner

## 2022-05-02 ENCOUNTER — Ambulatory Visit: Payer: HMO

## 2022-05-02 VITALS — BP 115/71 | HR 71 | Temp 97.7°F | Resp 20 | Ht 69.0 in | Wt 191.0 lb

## 2022-05-02 DIAGNOSIS — S61411A Laceration without foreign body of right hand, initial encounter: Secondary | ICD-10-CM

## 2022-05-02 MED ORDER — CEPHALEXIN 500 MG PO CAPS: 500.0000 mg | ORAL_CAPSULE | Freq: Two times a day (BID) | ORAL | 0 refills | Status: DC

## 2022-05-02 NOTE — Progress Notes (Signed)
   Acute Office Visit  Subjective:     Patient ID: Rhonda Thomas, female    DOB: 08-19-1956, 66 y.o.   MRN: [id number]  Chief Complaint  Patient presents with   Laceration    Laceration  The incident occurred 6 to 12 hours ago. The laceration is located on the Right hand. The laceration is 1 cm in size. Injury mechanism: scissors. The patient is experiencing no pain. She reports no foreign bodies present. Her tetanus status is UTD.    Review of Systems  Constitutional: Negative.   HENT: Negative.    Eyes: Negative.   Respiratory: Negative.    Cardiovascular: Negative.   Genitourinary: Negative.   Skin: Negative.   All other systems reviewed and are negative.      Objective:    BP 115/71   Pulse 71   Temp 97.7 F (36.5 C)   Resp 20   Ht '5\' 9"'$  (1.753 m)   Wt 191 lb (86.6 kg)   SpO2 95%   BMI 28.21 kg/m    Physical Exam Vitals and nursing note reviewed.  Constitutional:      Appearance: Normal appearance.  HENT:     Head: Normocephalic.     Right Ear: External ear normal.     Left Ear: External ear normal.     Nose: Nose normal.     Mouth/Throat:     Mouth: Mucous membranes are moist.     Pharynx: Oropharynx is clear.  Eyes:     Conjunctiva/sclera: Conjunctivae normal.  Cardiovascular:     Rate and Rhythm: Normal rate and regular rhythm.     Pulses: Normal pulses.     Heart sounds: Normal heart sounds.  Pulmonary:     Effort: Pulmonary effort is normal.     Breath sounds: Normal breath sounds.  Abdominal:     General: Bowel sounds are normal.  Skin:    General: Skin is warm.     Findings: Wound present.  Neurological:     Mental Status: She is alert and oriented to person, place, and time.    No results found for any visits on 05/02/22.      Assessment & Plan:  Skin tear assessed, cleaned and disinfected with Betadine, cleaned with saline and dressed with triple antibiotic and Steri-Strips Education provided to patient on signs and  symptoms of infection.  Printed handouts given.  Patient knows to return with worsening symptoms. Take all medication as prescribed Tylenol/anti-inflammatory for pain.  Problem List Items Addressed This Visit   None Visit Diagnoses     Skin tear of hand without complication, right, initial encounter    -  Primary   Relevant Medications   cephALEXin (KEFLEX) 500 MG capsule       Meds ordered this encounter  Medications   cephALEXin (KEFLEX) 500 MG capsule    Sig: Take 1 capsule (500 mg total) by mouth 2 (two) times daily.    Dispense:  14 capsule    Refill:  0    Order Specific Question:   Supervising Provider    Answer:   Claretta Fraise [[id number]]    Return if symptoms worsen or fail to improve.  Ivy Lynn, NP

## 2022-05-02 NOTE — Patient Instructions (Signed)
Skin Tear A skin tear is a wound in which the top layers of skin peel off. This is a common problem for older people. It can also be a problem for people who take certain medicines for too long. To repair the skin, your doctor may use: Tape. Skin tape (adhesive) strips. A bandage (dressing) may also be placed over the tape or skin tape strips. Follow these instructions at home: Keep your wound clean Clean the wound as told by your doctor. You may be told to keep the wound dry for the first few days. If you are told to clean the wound: Wash the wound with mild soap and water, a wound cleanser, or a salt-water (saline) solution. If you use soap, rinse the wound with water to get all the soap off. Do not rub the wound dry. Pat the wound gently with a clean towel or let it air-dry. Change any bandage as told by your doctor. This includes changing the bandage if it gets wet, gets dirty, or starts to smell bad. To change your bandage: Wash your hands with soap and water for at least 20 seconds before and after you change your bandage. If you cannot use soap and water, use hand sanitizer. Leave tape or skin tape strips alone unless you are told to take them off. You may trim the edges of the tape strips if they curl up. Watch for signs of infection Check your wound every day for signs of infection. Check for: Redness, swelling, or pain. More fluid or blood. Warmth. Pus or a bad smell.  Protect your wound Do not scratch or pick at the wound. Protect the injured area until it has healed. Medicines Take or apply over-the-counter and prescription medicines only as told by your doctor. If you were prescribed an antibiotic medicine, take or apply it as told by your doctor. Do not stop using it even if your condition gets better. General instructions  Keep the bandage dry. Do not take baths, swim, use a hot tub, or do anything that puts your wound underwater. Ask your doctor about taking showers or  sponge baths. Keep all follow-up visits. Contact a doctor if: You have any of these signs of infection in your wound: Redness, swelling, or pain. More fluid or blood. Warmth. Pus or a bad smell. Get help right away if: You have a red streak that goes away from the skin tear. You have a fever and chills, and your symptoms get worse all of a sudden. Summary A skin tear is a wound in which the top layers of skin peel off. To repair the skin, your doctor may use tape or skin tape strips. Change any bandage as told by your doctor. Take or apply over-the-counter and prescription medicines only as told by your doctor. Contact a doctor if you have signs of infection. This information is not intended to replace advice given to you by your health care provider. Make sure you discuss any questions you have with your health care provider. Document Revised: 03/04/2020 Document Reviewed: 03/04/2020 Elsevier Patient Education  2023 [employer]  

## 2022-05-05 ENCOUNTER — Encounter (INDEPENDENT_AMBULATORY_CARE_PROVIDER_SITE_OTHER): Payer: Self-pay | Admitting: Ophthalmology

## 2022-05-05 ENCOUNTER — Ambulatory Visit (INDEPENDENT_AMBULATORY_CARE_PROVIDER_SITE_OTHER): Payer: HMO | Admitting: Ophthalmology

## 2022-05-05 DIAGNOSIS — H2513 Age-related nuclear cataract, bilateral: Secondary | ICD-10-CM | POA: Diagnosis not present

## 2022-05-05 DIAGNOSIS — H35372 Puckering of macula, left eye: Secondary | ICD-10-CM | POA: Diagnosis not present

## 2022-05-05 DIAGNOSIS — E113293 Type 2 diabetes mellitus with mild nonproliferative diabetic retinopathy without macular edema, bilateral: Secondary | ICD-10-CM

## 2022-05-05 NOTE — Assessment & Plan Note (Signed)
Progressive NSC changes likely accounting for the patient's nighttime vision and dark rainy days troubles and using flashlights for normal activity  I explained the patient that cataract surgery could be a possibility for her so as to the refractive error changes that are ongoing with poor blood sugar control currently

## 2022-05-05 NOTE — Progress Notes (Signed)
05/05/2022     CHIEF COMPLAINT Patient presents for  Chief Complaint  Patient presents with   Eye Problem      HISTORY OF PRESENT ILLNESS: Rhonda Thomas is a 66 y.o. female who presents to the clinic today for:   HPI   WIP- decreased vision over past 6 months, seeing spots and squiggly lines, last seen 01/2020. Last time I was here he said I have a "fold on my eye." I was having issues with my vision then, but then I got COVID in December 2022 and it seemed to have made my vision worse.  Patient reports seeing spots and squiggly lines for a few years but reports they have been worse since having COVID. Patient reports she was told she has cataracts by Dr. Zadie Rhine when she was last seen.  Type 2 diabetic. A1C: 7.5, about 3 months ago LBS: not checked this morning Last edited by Laurin Coder on 05/05/2022  9:31 AM.      Referring physician: Dettinger, Fransisca Kaufmann, MD [place],  [place] [address]  HISTORICAL INFORMATION:   Selected notes from the MEDICAL RECORD NUMBER    Lab Results  Component Value Date   HGBA1C 7.4 (H) 09/23/2021     CURRENT MEDICATIONS: No current outpatient medications on file. (Ophthalmic Drugs)   No current facility-administered medications for this visit. (Ophthalmic Drugs)   Current Outpatient Medications (Other)  Medication Sig   ASPIRIN LOW DOSE 81 MG EC tablet TAKE 1 TABLET BY MOUTH EVERY DAY   cephALEXin (KEFLEX) 500 MG capsule Take 1 capsule (500 mg total) by mouth 2 (two) times daily.   Continuous Blood Gluc Receiver (DEXCOM G6 RECEIVER) DEVI 1 each by Does not apply route 4 (four) times daily.   Continuous Blood Gluc Sensor (DEXCOM G6 SENSOR) MISC 1 each by Does not apply route once a week.   Continuous Blood Gluc Transmit (DEXCOM G6 TRANSMITTER) MISC 1 each by Does not apply route 4 (four) times daily.   Dulaglutide (TRULICITY) 4.5 TI/4.5YK SOPN Inject 4.5 mg as directed once a week.   DULoxetine (CYMBALTA) 60 MG  capsule Take 1 capsule (60 mg total) by mouth at bedtime.   ibuprofen (ADVIL) 800 MG tablet Take 800 mg by mouth every 8 (eight) hours as needed.   insulin aspart (NOVOLOG FLEXPEN) 100 UNIT/ML FlexPen Inject 3-20 Units into the skin 3 times/day as needed-between meals & bedtime for high blood sugar.   Insulin Glargine (BASAGLAR KWIKPEN) 100 UNIT/ML Inject 30 Units into the skin daily.   Insulin Pen Needle (GLOBAL EASE INJECT PEN NEEDLES) 31G X 5 MM MISC USE FOUR TIMES DAILY   metFORMIN (GLUCOPHAGE) 500 MG tablet TAKE 1 TABLET BY MOUTH TWICE DAILY WITH A MEAL   NUCYNTA ER 150 MG TB12 Take 1 tablet by mouth every 12 (twelve) hours.   OXcarbazepine (TRILEPTAL) 150 MG tablet Take 1 twice daily   pregabalin (LYRICA) 150 MG capsule 1 in am 2 at night   rOPINIRole (REQUIP) 1 MG tablet TAKE 1 TABLET BY MOUTH AT BEDTIME   rosuvastatin (CRESTOR) 5 MG tablet Take 1 tablet (5 mg total) by mouth at bedtime.   VITAMIN D PO Take 1,000 Units by mouth daily.   No current facility-administered medications for this visit. (Other)      REVIEW OF SYSTEMS: ROS   Negative for: Constitutional, Gastrointestinal, Neurological, Skin, Genitourinary, Musculoskeletal, HENT, Endocrine, Cardiovascular, Eyes, Respiratory, Psychiatric, Allergic/Imm, Heme/Lymph Last edited by Hurman Horn, MD  on 05/05/2022 10:13 AM.       ALLERGIES Allergies  Allergen Reactions   Canagliflozin Other (See Comments)    vaginitis vaginitis   Pioglitazone Other (See Comments)    Edema  Edema    PAST MEDICAL HISTORY Past Medical History:  Diagnosis Date   Anxiety    Attention deficit disorder (ADD)    Depression    Diabetes ([place])    Hiatal hernia    Movement disorder    Neuropathy    Schatzki's ring    Sleep apnea    Past Surgical History:  Procedure Laterality Date   ABDOMINAL HYSTERECTOMY     BREAST LUMPECTOMY WITH RADIOACTIVE SEED LOCALIZATION Right 04/25/2016   Procedure: RIGHT BREAST LUMPECTOMY WITH RADIOACTIVE  SEED LOCALIZATION;  Surgeon: Autumn Messing III, MD;  Location: [facility];  Service: General;  Laterality: Right;   CHOLECYSTECTOMY     RECTOCELE REPAIR     SPINAL CORD STIMULATOR IMPLANT     TOE AMPUTATION Right    2nd toe rt foot   TUBAL LIGATION      FAMILY HISTORY Family History  Problem Relation Age of Onset   Stroke Mother    Sleep apnea Mother    Diabetes Father    Heart disease Father        pacemaker   Sleep apnea Father    Diabetes Other    Heart Problems Other    Colon cancer Neg Hx    Breast cancer Neg Hx     SOCIAL HISTORY Social History   Tobacco Use   Smoking status: Never   Smokeless tobacco: Never  Vaping Use   Vaping Use: Never used  Substance Use Topics   Alcohol use: No    Alcohol/week: 0.0 standard drinks   Drug use: Never         OPHTHALMIC EXAM:  Base Eye Exam     Visual Acuity (ETDRS)       Right Left   Dist [place] 20/40 20/20 -1   Dist ph [place] 20/30 +2          Tonometry (Tonopen, 9:34 AM)       Right Left   Pressure 10 5         Pupils       Pupils Dark Light React APD   Right PERRL 4 3 Brisk None   Left PERRL 4 3 Brisk None         Visual Fields (Counting fingers)       Left Right    Full Full         Extraocular Movement       Right Left    Full Full         Neuro/Psych     Oriented x3: Yes   Mood/Affect: Normal         Dilation     Both eyes: 1.0% Mydriacyl, 2.5% Phenylephrine @ 9:34 AM           Slit Lamp and Fundus Exam     Slit Lamp Exam       Right Left   Lids/Lashes Normal Normal   Conjunctiva/Sclera White and quiet White and quiet   Cornea Clear Clear   Anterior Chamber Deep and quiet Deep and quiet   Iris Round and reactive Round and reactive   Lens 2+ Nuclear sclerosis 2+ Nuclear sclerosis   Anterior Vitreous Normal Normal         Fundus Exam  Right Left   Posterior Vitreous Posterior vitreous detachment Posterior vitreous detachment   Disc Normal  Normal   C/D Ratio 0.35 0.35   Macula Normal Normal   Vessels NPDR- Mild NPDR- Mild   Periphery Normal, no holes or tears 25 D exam Normal, no holes or tears 25 D exam            IMAGING AND PROCEDURES  Imaging and Procedures for 05/05/22  OCT, Retina - OU - Both Eyes       Right Eye Quality was good. Scan locations included subfoveal. Central Foveal Thickness: 284. Progression has been stable. Findings include normal foveal contour.   Left Eye Quality was good. Scan locations included subfoveal. Central Foveal Thickness: 309. Progression has been stable. Findings include normal foveal contour, epiretinal membrane.   Notes OS minor, ERM, no distortion of the fovea     Color Fundus Photography Optos - OU - Both Eyes       Right Eye Progression has been stable. Disc findings include normal observations. Macula : normal observations. Vessels : normal observations. Periphery : normal observations.   Left Eye Progression has been stable. Disc findings include normal observations. Macula : normal observations. Vessels : normal observations. Periphery : normal observations.              ASSESSMENT/PLAN:  Mild nonproliferative diabetic retinopathy of both eyes (HCC) 20 to 25 years of diabetes mellitus, patient reports recent months of good blood sugar control  Enhance blood sugar control encouraged  Nuclear sclerotic cataract of both eyes Progressive NSC changes likely accounting for the patient's nighttime vision and dark rainy days troubles and using flashlights for normal activity  I explained the patient that cataract surgery could be a possibility for her so as to the refractive error changes that are ongoing with poor blood sugar control currently     ICD-10-CM   1. Epiretinal membrane, left eye  H35.372 OCT, Retina - OU - Both Eyes    Color Fundus Photography Optos - OU - Both Eyes    2. Nuclear sclerotic cataract of both eyes  H25.13     3. Mild  nonproliferative diabetic retinopathy of both eyes associated with type 2 diabetes mellitus, macular edema presence unspecified ([place])  W58.0998       OU, will refer to Dr. Rutherford Guys locally [facility] for rapidity of evaluation refractive assistance and possible cataract evaluation if patient has symptoms attributable to this difficulty.  2.  3.  Ophthalmic Meds Ordered this visit:  No orders of the defined types were placed in this encounter.      Return in about 1 year (around 05/06/2023) for DILATE OU, COLOR FP, OCT.  Patient Instructions  Patient instructed to see general eye care, optometry and/or ophthalmology for refractive assistance and also to monitor cataract and their progression in each eye.  I explained the patient that fluctuating vision in her case is likely from the poor blood sugar control the swings thereof triggering differences in refraction.  Thus prior to attempting refractive assistance, will likely need good blood sugar control stable at some level for roughly 4 to 5-week so as to get the most accurate measurement  Explained the diagnoses, plan, and follow up with the patient and they expressed understanding.  Patient expressed understanding of the importance of proper follow up care.   Clent Demark Shanel Prazak M.D. Diseases & Surgery of the Retina and Vitreous [facility] [place] 05/05/22  Abbreviations: M myopia (nearsighted); A astigmatism; H hyperopia (farsighted); P presbyopia; Mrx spectacle prescription;  CTL contact lenses; OD right eye; OS left eye; OU both eyes  XT exotropia; ET esotropia; PEK punctate epithelial keratitis; PEE punctate epithelial erosions; DES dry eye syndrome; MGD meibomian gland dysfunction; ATs artificial tears; PFAT's preservative free artificial tears; [place] nuclear sclerotic cataract; PSC posterior subcapsular cataract; ERM epi-retinal membrane; PVD posterior vitreous detachment; RD retinal detachment; DM diabetes  mellitus; DR diabetic retinopathy; NPDR non-proliferative diabetic retinopathy; PDR proliferative diabetic retinopathy; CSME clinically significant macular edema; DME diabetic macular edema; dbh dot blot hemorrhages; CWS cotton wool spot; POAG primary open angle glaucoma; C/D cup-to-disc ratio; HVF humphrey visual field; GVF goldmann visual field; OCT optical coherence tomography; IOP intraocular pressure; BRVO Branch retinal vein occlusion; CRVO central retinal vein occlusion; CRAO central retinal artery occlusion; BRAO branch retinal artery occlusion; RT retinal tear; SB scleral buckle; PPV pars plana vitrectomy; VH Vitreous hemorrhage; PRP panretinal laser photocoagulation; IVK intravitreal kenalog; VMT vitreomacular traction; MH Macular hole;  NVD neovascularization of the disc; NVE neovascularization elsewhere; AREDS age related eye disease study; ARMD age related macular degeneration; POAG primary open angle glaucoma; EBMD epithelial/anterior basement membrane dystrophy; ACIOL anterior chamber intraocular lens; IOL intraocular lens; PCIOL posterior chamber intraocular lens; Phaco/IOL phacoemulsification with intraocular lens placement; [place] photorefractive keratectomy; LASIK laser assisted in situ keratomileusis; HTN hypertension; DM diabetes mellitus; COPD chronic obstructive pulmonary disease

## 2022-05-05 NOTE — Patient Instructions (Signed)
Patient instructed to see general eye care, optometry and/or ophthalmology for refractive assistance and also to monitor cataract and their progression in each eye.  I explained the patient that fluctuating vision in her case is likely from the poor blood sugar control the swings thereof triggering differences in refraction.  Thus prior to attempting refractive assistance, will likely need good blood sugar control stable at some level for roughly 4 to 5-week so as to get the most accurate measurement

## 2022-05-05 NOTE — Assessment & Plan Note (Addendum)
20 to 25 years of diabetes mellitus, patient reports recent months of good blood sugar control  Enhance blood sugar control encouraged

## 2022-05-17 DIAGNOSIS — M86171 Other acute osteomyelitis, right ankle and foot: Secondary | ICD-10-CM | POA: Diagnosis not present

## 2022-05-17 DIAGNOSIS — R601 Generalized edema: Secondary | ICD-10-CM | POA: Diagnosis not present

## 2022-05-17 DIAGNOSIS — E1142 Type 2 diabetes mellitus with diabetic polyneuropathy: Secondary | ICD-10-CM | POA: Diagnosis not present

## 2022-05-21 ENCOUNTER — Other Ambulatory Visit: Payer: Self-pay | Admitting: Family Medicine

## 2022-05-21 DIAGNOSIS — E1169 Type 2 diabetes mellitus with other specified complication: Secondary | ICD-10-CM

## 2022-05-25 ENCOUNTER — Other Ambulatory Visit: Payer: Self-pay | Admitting: Family Medicine

## 2022-05-25 DIAGNOSIS — E1049 Type 1 diabetes mellitus with other diabetic neurological complication: Secondary | ICD-10-CM

## 2022-05-26 DIAGNOSIS — M65352 Trigger finger, left little finger: Secondary | ICD-10-CM | POA: Diagnosis not present

## 2022-05-26 DIAGNOSIS — G5603 Carpal tunnel syndrome, bilateral upper limbs: Secondary | ICD-10-CM | POA: Diagnosis not present

## 2022-05-30 ENCOUNTER — Encounter: Payer: Self-pay | Admitting: Psychology
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
## 2022-05-31 ENCOUNTER — Encounter: Payer: HMO | Attending: Psychology | Admitting: Psychology

## 2022-05-31 DIAGNOSIS — F331 Major depressive disorder, recurrent, moderate: Secondary | ICD-10-CM | POA: Insufficient documentation

## 2022-05-31 DIAGNOSIS — F411 Generalized anxiety disorder: Secondary | ICD-10-CM | POA: Insufficient documentation

## 2022-05-31 DIAGNOSIS — E1142 Type 2 diabetes mellitus with diabetic polyneuropathy: Secondary | ICD-10-CM | POA: Insufficient documentation

## 2022-06-07 DIAGNOSIS — M86171 Other acute osteomyelitis, right ankle and foot: Secondary | ICD-10-CM | POA: Diagnosis not present

## 2022-06-07 DIAGNOSIS — M79671 Pain in right foot: Secondary | ICD-10-CM | POA: Diagnosis not present

## 2022-06-21 ENCOUNTER — Encounter: Payer: Self-pay | Admitting: Family Medicine

## 2022-06-21 ENCOUNTER — Other Ambulatory Visit: Payer: Self-pay | Admitting: Family Medicine

## 2022-06-21 DIAGNOSIS — L97511 Non-pressure chronic ulcer of other part of right foot limited to breakdown of skin: Secondary | ICD-10-CM | POA: Diagnosis not present

## 2022-06-21 DIAGNOSIS — E1049 Type 1 diabetes mellitus with other diabetic neurological complication: Secondary | ICD-10-CM

## 2022-06-21 DIAGNOSIS — M86171 Other acute osteomyelitis, right ankle and foot: Secondary | ICD-10-CM | POA: Diagnosis not present

## 2022-06-21 NOTE — Telephone Encounter (Signed)
Dettinger NTBS 30 days given 05/25/22

## 2022-06-21 NOTE — Telephone Encounter (Signed)
Left message for pt to schedule appt and mailed letter 

## 2022-06-22 ENCOUNTER — Telehealth: Payer: Self-pay | Admitting: Family Medicine

## 2022-06-22 DIAGNOSIS — E1049 Type 1 diabetes mellitus with other diabetic neurological complication: Secondary | ICD-10-CM

## 2022-06-22 DIAGNOSIS — E1169 Type 2 diabetes mellitus with other specified complication: Secondary | ICD-10-CM

## 2022-06-22 MED ORDER — METFORMIN HCL 500 MG PO TABS: 500.0000 mg | ORAL_TABLET | Freq: Two times a day (BID) | ORAL | 0 refills | Status: DC

## 2022-06-22 MED ORDER — TRULICITY 4.5 MG/0.5ML ~~LOC~~ SOAJ: 4.5000 mg | SUBCUTANEOUS | 0 refills | Status: DC

## 2022-06-22 NOTE — Telephone Encounter (Signed)
Refills for one month sent to pharmacy until patient can be seen, patient aware.

## 2022-06-22 NOTE — Telephone Encounter (Signed)
  Prescription Request  06/22/2022  Is this a "Controlled Substance" medicine? NO  Have you seen your PCP in the last 2 weeks? No pt made first available with PCP on 07/29/2022, but pt is about to run out, needs refill  If YES, route message to pool  -  If NO, patient needs to be scheduled for appointment.  What is the name of the medication or equipment? Dulaglutide (TRULICITY) 4.5 XB/9.3JQ SOPN  metFORMIN (GLUCOPHAGE) 500 MG tablet  Have you contacted your pharmacy to request a refill? yes   Which pharmacy would you like this sent to? [facility]   Patient notified that their request is being sent to the clinical staff for review and that they should receive a response within 2 business days.

## 2022-06-23 ENCOUNTER — Telehealth: Payer: Self-pay | Admitting: *Deleted

## 2022-06-23 DIAGNOSIS — Z794 Long term (current) use of insulin: Secondary | ICD-10-CM

## 2022-06-23 DIAGNOSIS — E1169 Type 2 diabetes mellitus with other specified complication: Secondary | ICD-10-CM

## 2022-06-23 DIAGNOSIS — E1142 Type 2 diabetes mellitus with diabetic polyneuropathy: Secondary | ICD-10-CM

## 2022-06-23 NOTE — Telephone Encounter (Signed)
PA for DexCom-In Process  Key: [id number]  PA #-  [id number]

## 2022-06-24 MED ORDER — DEXCOM G6 RECEIVER DEVI: 1.0000 | Freq: Four times a day (QID) | 3 refills | Status: DC

## 2022-06-24 MED ORDER — DEXCOM G6 TRANSMITTER MISC: 1.0000 | Freq: Four times a day (QID) | 3 refills | Status: DC

## 2022-06-24 MED ORDER — DEXCOM G6 SENSOR MISC: 1.0000 | 11 refills | Status: DC

## 2022-06-24 NOTE — Telephone Encounter (Signed)
Rhonda Thomas Rhonda Thomas - PA Case ID: [id number] Need help? Call us at [phone] Outcome Approvedon July 13 Your PA request has been approved. Additional information will be provided in the approval communication. (Message 1145) Drug Dexcom G6 Sensor Form [employer] Electronic PA Form [phone] NCPDP)   Pharmacy informed.  The prescriptions have expired and they need new prescriptions for receiver, transmitter, and sensors.  Prescription sent to pharmacy.

## 2022-06-28 ENCOUNTER — Telehealth: Payer: Self-pay | Admitting: Family Medicine

## 2022-06-28 DIAGNOSIS — E1142 Type 2 diabetes mellitus with diabetic polyneuropathy: Secondary | ICD-10-CM

## 2022-06-29 MED ORDER — FREESTYLE LIBRE 3 SENSOR MISC: 1.0000 | 3 refills | Status: DC

## 2022-06-29 NOTE — Telephone Encounter (Signed)
Sent the freestyle libre sensor for the patient

## 2022-07-05 DIAGNOSIS — Z89431 Acquired absence of right foot: Secondary | ICD-10-CM | POA: Diagnosis not present

## 2022-07-05 DIAGNOSIS — E1169 Type 2 diabetes mellitus with other specified complication: Secondary | ICD-10-CM | POA: Diagnosis not present

## 2022-07-05 DIAGNOSIS — E1165 Type 2 diabetes mellitus with hyperglycemia: Secondary | ICD-10-CM | POA: Diagnosis not present

## 2022-07-05 DIAGNOSIS — L97416 Non-pressure chronic ulcer of right heel and midfoot with bone involvement without evidence of necrosis: Secondary | ICD-10-CM | POA: Diagnosis not present

## 2022-07-05 DIAGNOSIS — E11621 Type 2 diabetes mellitus with foot ulcer: Secondary | ICD-10-CM | POA: Diagnosis not present

## 2022-07-05 DIAGNOSIS — Z79899 Other long term (current) drug therapy: Secondary | ICD-10-CM | POA: Diagnosis not present

## 2022-07-05 DIAGNOSIS — Z733 Stress, not elsewhere classified: Secondary | ICD-10-CM | POA: Diagnosis not present

## 2022-07-05 DIAGNOSIS — B965 Pseudomonas (aeruginosa) (mallei) (pseudomallei) as the cause of diseases classified elsewhere: Secondary | ICD-10-CM | POA: Diagnosis not present

## 2022-07-05 DIAGNOSIS — G2581 Restless legs syndrome: Secondary | ICD-10-CM | POA: Diagnosis not present

## 2022-07-05 DIAGNOSIS — G629 Polyneuropathy, unspecified: Secondary | ICD-10-CM | POA: Diagnosis not present

## 2022-07-05 DIAGNOSIS — E785 Hyperlipidemia, unspecified: Secondary | ICD-10-CM | POA: Diagnosis not present

## 2022-07-05 DIAGNOSIS — Z23 Encounter for immunization: Secondary | ICD-10-CM | POA: Diagnosis not present

## 2022-07-05 DIAGNOSIS — B957 Other staphylococcus as the cause of diseases classified elsewhere: Secondary | ICD-10-CM | POA: Diagnosis not present

## 2022-07-05 DIAGNOSIS — Z794 Long term (current) use of insulin: Secondary | ICD-10-CM | POA: Diagnosis not present

## 2022-07-05 DIAGNOSIS — Z792 Long term (current) use of antibiotics: Secondary | ICD-10-CM | POA: Diagnosis not present

## 2022-07-05 DIAGNOSIS — A498 Other bacterial infections of unspecified site: Secondary | ICD-10-CM | POA: Diagnosis not present

## 2022-07-05 DIAGNOSIS — M86171 Other acute osteomyelitis, right ankle and foot: Secondary | ICD-10-CM | POA: Diagnosis not present

## 2022-07-05 DIAGNOSIS — E1140 Type 2 diabetes mellitus with diabetic neuropathy, unspecified: Secondary | ICD-10-CM | POA: Diagnosis not present

## 2022-07-05 DIAGNOSIS — M86471 Chronic osteomyelitis with draining sinus, right ankle and foot: Secondary | ICD-10-CM | POA: Diagnosis not present

## 2022-07-05 DIAGNOSIS — E1142 Type 2 diabetes mellitus with diabetic polyneuropathy: Secondary | ICD-10-CM | POA: Diagnosis not present

## 2022-07-05 DIAGNOSIS — L97514 Non-pressure chronic ulcer of other part of right foot with necrosis of bone: Secondary | ICD-10-CM | POA: Diagnosis not present

## 2022-07-05 DIAGNOSIS — M869 Osteomyelitis, unspecified: Secondary | ICD-10-CM | POA: Diagnosis not present

## 2022-07-05 DIAGNOSIS — Z9682 Presence of neurostimulator: Secondary | ICD-10-CM | POA: Diagnosis not present

## 2022-07-05 DIAGNOSIS — E11628 Type 2 diabetes mellitus with other skin complications: Secondary | ICD-10-CM | POA: Diagnosis not present

## 2022-07-05 DIAGNOSIS — L089 Local infection of the skin and subcutaneous tissue, unspecified: Secondary | ICD-10-CM | POA: Diagnosis not present

## 2022-07-07 ENCOUNTER — Ambulatory Visit: Payer: HMO | Admitting: Psychology

## 2022-07-11 DIAGNOSIS — M86471 Chronic osteomyelitis with draining sinus, right ankle and foot: Secondary | ICD-10-CM | POA: Diagnosis not present

## 2022-07-15 ENCOUNTER — Other Ambulatory Visit: Payer: Self-pay | Admitting: Family Medicine

## 2022-07-15 DIAGNOSIS — Z794 Long term (current) use of insulin: Secondary | ICD-10-CM

## 2022-07-17 ENCOUNTER — Other Ambulatory Visit: Payer: Self-pay | Admitting: Family Medicine

## 2022-07-17 DIAGNOSIS — E1049 Type 1 diabetes mellitus with other diabetic neurological complication: Secondary | ICD-10-CM

## 2022-07-18 DIAGNOSIS — M86471 Chronic osteomyelitis with draining sinus, right ankle and foot: Secondary | ICD-10-CM | POA: Diagnosis not present

## 2022-07-21 DIAGNOSIS — E113293 Type 2 diabetes mellitus with mild nonproliferative diabetic retinopathy without macular edema, bilateral: Secondary | ICD-10-CM | POA: Diagnosis not present

## 2022-07-21 DIAGNOSIS — Z794 Long term (current) use of insulin: Secondary | ICD-10-CM | POA: Diagnosis not present

## 2022-07-21 DIAGNOSIS — H25813 Combined forms of age-related cataract, bilateral: Secondary | ICD-10-CM | POA: Diagnosis not present

## 2022-07-21 DIAGNOSIS — H35372 Puckering of macula, left eye: Secondary | ICD-10-CM | POA: Diagnosis not present

## 2022-07-25 ENCOUNTER — Telehealth: Payer: Self-pay | Admitting: Family Medicine

## 2022-07-25 MED ORDER — FREESTYLE LIBRE 2 READER DEVI: 0 refills | Status: DC

## 2022-07-25 NOTE — Telephone Encounter (Signed)
[facility] script for sensors, pt needs reader.

## 2022-07-29 ENCOUNTER — Encounter: Payer: Self-pay | Admitting: Family Medicine

## 2022-07-29 ENCOUNTER — Ambulatory Visit (INDEPENDENT_AMBULATORY_CARE_PROVIDER_SITE_OTHER): Payer: HMO | Admitting: Family Medicine

## 2022-07-29 VITALS — BP 103/63 | HR 83 | Temp 98.0°F | Ht 69.0 in | Wt 195.0 lb

## 2022-07-29 DIAGNOSIS — E1142 Type 2 diabetes mellitus with diabetic polyneuropathy: Secondary | ICD-10-CM | POA: Diagnosis not present

## 2022-07-29 DIAGNOSIS — E1049 Type 1 diabetes mellitus with other diabetic neurological complication: Secondary | ICD-10-CM

## 2022-07-29 DIAGNOSIS — L97522 Non-pressure chronic ulcer of other part of left foot with fat layer exposed: Secondary | ICD-10-CM

## 2022-07-29 DIAGNOSIS — E785 Hyperlipidemia, unspecified: Secondary | ICD-10-CM

## 2022-07-29 DIAGNOSIS — L97521 Non-pressure chronic ulcer of other part of left foot limited to breakdown of skin: Secondary | ICD-10-CM | POA: Diagnosis not present

## 2022-07-29 DIAGNOSIS — Z23 Encounter for immunization: Secondary | ICD-10-CM

## 2022-07-29 DIAGNOSIS — E11621 Type 2 diabetes mellitus with foot ulcer: Secondary | ICD-10-CM

## 2022-07-29 DIAGNOSIS — E113293 Type 2 diabetes mellitus with mild nonproliferative diabetic retinopathy without macular edema, bilateral: Secondary | ICD-10-CM | POA: Diagnosis not present

## 2022-07-29 MED ORDER — FREESTYLE LIBRE 2 SENSOR MISC: 1.0000 | 3 refills | Status: DC

## 2022-07-29 MED ORDER — DULOXETINE HCL 60 MG PO CPEP: 60.0000 mg | ORAL_CAPSULE | Freq: Every day | ORAL | 3 refills | Status: DC

## 2022-07-29 MED ORDER — FREESTYLE LIBRE 2 READER DEVI: 0 refills | Status: DC

## 2022-07-29 MED ORDER — METFORMIN HCL 500 MG PO TABS: 500.0000 mg | ORAL_TABLET | Freq: Two times a day (BID) | ORAL | 3 refills | Status: DC

## 2022-07-29 MED ORDER — ROSUVASTATIN CALCIUM 5 MG PO TABS: 5.0000 mg | ORAL_TABLET | Freq: Every day | ORAL | 3 refills | Status: DC

## 2022-07-29 NOTE — Addendum Note (Signed)
Addended by: Alphonzo Dublin on: 07/29/2022 04:17 PM   Modules accepted: Orders

## 2022-07-29 NOTE — Progress Notes (Signed)
BP 103/63   Pulse 83   Temp 98 F (36.7 C)   Ht '5\' 9"'$  (1.753 m)   Wt 195 lb (88.5 kg)   SpO2 96%   BMI 28.80 kg/m    Subjective:   Patient ID: Rhonda Thomas, female    DOB: 05-04-1956, 66 y.o.   MRN: [id number]  HPI: Rhonda Thomas is a 66 y.o. female presenting on 07/29/2022 for Medical Management of Chronic Issues, Diabetes, and Anxiety   HPI Type 1 diabetes mellitus Patient comes in today for recheck of his diabetes. Patient has been currently taking Trulicity and Consulting civil engineer. Patient is not currently on an ACE inhibitor/ARB. Patient has not seen an ophthalmologist this year. Patient states has a foot ulceration recently had a partial ray amputation. The symptom started onset as an adult hyperlipidemia ARE RELATED TO DM   Hyperlipidemia Patient is coming in for recheck of his hyperlipidemia. The patient is currently taking Crestor. They deny any issues with myalgias or history of liver damage from it. They deny any focal numbness or weakness or chest pain.   Diabetic foot ulceration and partial ray amputation Like a 12 digit number centimeters trainer code we can add so he does play it we can add him because there is for less  Relevant past medical, surgical, family and social history reviewed and updated as indicated. Interim medical history since our last visit reviewed. Allergies and medications reviewed and updated.  Review of Systems  Constitutional:  Negative for chills and fever.  HENT:  Negative for congestion, ear discharge and ear pain.   Eyes:  Negative for redness and visual disturbance.  Respiratory:  Negative for chest tightness and shortness of breath.   Cardiovascular:  Negative for chest pain and leg swelling.  Genitourinary:  Negative for difficulty urinating and dysuria.  Musculoskeletal:  Negative for back pain and gait problem.  Skin:  Negative for rash.  Neurological:  Negative for dizziness, light-headedness and headaches.   Psychiatric/Behavioral:  Negative for agitation and behavioral problems.   All other systems reviewed and are negative.   Per HPI unless specifically indicated above   Allergies as of 07/29/2022       Reactions   Canagliflozin Other (See Comments)   vaginitis vaginitis   Pioglitazone Other (See Comments)   Edema Edema        Medication List        Accurate as of July 29, 2022  3:43 PM. If you have any questions, ask your nurse or doctor.          STOP taking these medications    cephALEXin 500 MG capsule Commonly known as: Keflex Stopped by: Fransisca Kaufmann Dakoda Laventure, MD   ciprofloxacin 500 MG tablet Commonly known as: CIPRO Stopped by: Worthy Rancher, MD   Dexcom G6 Transmitter Misc Stopped by: Fransisca Kaufmann Deryl Giroux, MD   Nucynta ER 150 MG Tb12 Generic drug: Tapentadol HCl Stopped by: Worthy Rancher, MD       TAKE these medications    Aspirin Low Dose 81 MG tablet Generic drug: aspirin EC TAKE 1 TABLET BY MOUTH EVERY DAY   aspirin EC 81 MG tablet Take 1 tablet by mouth daily.   Basaglar KwikPen 100 UNIT/ML Inject 30 Units into the skin daily.   Belbuca 300 MCG Film Generic drug: Buprenorphine HCl Take 1 Film by mouth every 12 (twelve) hours.   carbidopa-levodopa 25-100 MG tablet Commonly known as: SINEMET IR Take 1 tablet by mouth  in the morning and at bedtime.   DULoxetine 60 MG capsule Commonly known as: CYMBALTA Take 1 capsule (60 mg total) by mouth at bedtime.   fluconazole 150 MG tablet Commonly known as: DIFLUCAN Take 150 mg by mouth daily.   FreeStyle [place] 2 Reader Kerrin Mo Use w/ Libre sensor to [employer] Dx E11.621   FreeStyle Libre 2 Sensor Misc 1 each by Does not apply route every 14 (fourteen) days. What changed: additional instructions Changed by: Fransisca Kaufmann Shauna Bodkins, MD   ibuprofen 800 MG tablet Commonly known as: ADVIL Take 800 mg by mouth every 8 (eight) hours as needed.   Insulin Pen Needle 31G X 5 MM  Misc Commonly known as: Global Ease Inject Pen Needles USE FOUR TIMES DAILY   metFORMIN 500 MG tablet Commonly known as: GLUCOPHAGE Take 1 tablet (500 mg total) by mouth 2 (two) times daily with a meal.   NovoLOG FlexPen 100 UNIT/ML FlexPen Generic drug: insulin aspart Inject 3-20 Units into the skin 3 times/day as needed-between meals & bedtime for high blood sugar.   OXcarbazepine 150 MG tablet Commonly known as: TRILEPTAL Take 1 twice daily   pregabalin 150 MG capsule Commonly known as: LYRICA 1 in am 2 at night   rOPINIRole 1 MG tablet Commonly known as: REQUIP TAKE 1 TABLET BY MOUTH AT BEDTIME   rosuvastatin 5 MG tablet Commonly known as: CRESTOR Take 1 tablet (5 mg total) by mouth at bedtime.   Trulicity 4.5 WF/0.9NA Sopn Generic drug: Dulaglutide Inject 4.5 mg into the skin once a week.   VITAMIN D PO Take 1,000 Units by mouth daily.         Objective:   BP 103/63   Pulse 83   Temp 98 F (36.7 C)   Ht '5\' 9"'$  (1.753 m)   Wt 195 lb (88.5 kg)   SpO2 96%   BMI 28.80 kg/m   Wt Readings from Last 3 Encounters:  07/29/22 195 lb (88.5 kg)  05/02/22 191 lb (86.6 kg)  09/29/21 195 lb (88.5 kg)    Physical Exam Vitals and nursing note reviewed.  Constitutional:      General: She is not in acute distress.    Appearance: She is well-developed. She is not diaphoretic.  Eyes:     Conjunctiva/sclera: Conjunctivae normal.  Cardiovascular:     Rate and Rhythm: Normal rate and regular rhythm.     Heart sounds: Normal heart sounds. No murmur heard. Pulmonary:     Effort: Pulmonary effort is normal. No respiratory distress.     Breath sounds: Normal breath sounds. No wheezing.  Musculoskeletal:        General: No swelling or tenderness. Normal range of motion.  Skin:    General: Skin is warm and dry.     Findings: Wound (Well bandaged) present. No rash.  Neurological:     Mental Status: She is alert and oriented to person, place, and time.      Coordination: Coordination normal.  Psychiatric:        Behavior: Behavior normal.       Assessment & Plan:   Problem List Items Addressed This Visit       Endocrine   Mild nonproliferative diabetic retinopathy of both eyes (HCC)   Relevant Medications   aspirin EC 81 MG tablet   Continuous Blood Gluc Receiver (FREESTYLE LIBRE 2 READER) DEVI   metFORMIN (GLUCOPHAGE) 500 MG tablet   rosuvastatin (CRESTOR) 5 MG tablet   Continuous Blood Gluc Sensor (  FREESTYLE LIBRE 2 SENSOR) MISC   Diabetic peripheral neuropathy (HCC) - Primary   Relevant Medications   aspirin EC 81 MG tablet   carbidopa-levodopa (SINEMET IR) 25-100 MG tablet   Continuous Blood Gluc Receiver (FREESTYLE LIBRE 2 READER) DEVI   DULoxetine (CYMBALTA) 60 MG capsule   metFORMIN (GLUCOPHAGE) 500 MG tablet   rosuvastatin (CRESTOR) 5 MG tablet   Continuous Blood Gluc Sensor (FREESTYLE LIBRE 2 SENSOR) MISC   Diabetic ulcer of toe of left foot associated with type 2 diabetes mellitus, with fat layer exposed (HCC)   Relevant Medications   aspirin EC 81 MG tablet   metFORMIN (GLUCOPHAGE) 500 MG tablet   rosuvastatin (CRESTOR) 5 MG tablet     Other   Hyperlipidemia   Relevant Medications   aspirin EC 81 MG tablet   rosuvastatin (CRESTOR) 5 MG tablet   Other Visit Diagnoses     Skin ulcer of left foot, limited to breakdown of skin (HCC)       Relevant Medications   DULoxetine (CYMBALTA) 60 MG capsule   Type 1 diabetes mellitus with other neurologic complication (HCC)       Relevant Medications   aspirin EC 81 MG tablet   Continuous Blood Gluc Receiver (FREESTYLE LIBRE 2 READER) DEVI   metFORMIN (GLUCOPHAGE) 500 MG tablet   rosuvastatin (CRESTOR) 5 MG tablet   Continuous Blood Gluc Sensor (FREESTYLE LIBRE 2 SENSOR) MISC       This office visit was a visit to discuss patient's diabetic management and because she is out of control and using insulin 4 times daily and having to check her blood sugars 4 times daily  I believe she would be a good candidate for a continuous subcutaneous glucose monitor such as freestyle libre.   Patient has a partial amputation of the outside of her right foot, she is still seeing wound care for this and they are doing wound care. Follow up plan: Return in about 3 months (around 10/29/2022), or if symptoms worsen or fail to improve, for Diabetes recheck.  Counseling provided for all of the vaccine components No orders of the defined types were placed in this encounter.   Caryl Pina, MD [facility] 07/29/2022, 3:43 PM

## 2022-08-01 ENCOUNTER — Encounter: Payer: HMO | Attending: Psychology | Admitting: Psychology

## 2022-08-01 DIAGNOSIS — E1142 Type 2 diabetes mellitus with diabetic polyneuropathy: Secondary | ICD-10-CM | POA: Insufficient documentation

## 2022-08-01 DIAGNOSIS — F411 Generalized anxiety disorder: Secondary | ICD-10-CM | POA: Diagnosis not present

## 2022-08-01 DIAGNOSIS — F331 Major depressive disorder, recurrent, moderate: Secondary | ICD-10-CM | POA: Insufficient documentation

## 2022-08-01 LAB — HM DIABETES EYE EXAM

## 2022-08-03 DIAGNOSIS — H25812 Combined forms of age-related cataract, left eye: Secondary | ICD-10-CM | POA: Diagnosis not present

## 2022-08-03 DIAGNOSIS — H2511 Age-related nuclear cataract, right eye: Secondary | ICD-10-CM | POA: Diagnosis not present

## 2022-08-03 DIAGNOSIS — H25011 Cortical age-related cataract, right eye: Secondary | ICD-10-CM | POA: Diagnosis not present

## 2022-08-03 DIAGNOSIS — H2512 Age-related nuclear cataract, left eye: Secondary | ICD-10-CM | POA: Diagnosis not present

## 2022-08-07 ENCOUNTER — Encounter: Payer: Self-pay | Admitting: Psychology

## 2022-08-07 NOTE — Progress Notes (Signed)
Patient ID: Rhonda Thomas, female   DOB: 03-03-1956, 66 y.o.   MRN: [id number] [name]   DOB: 02-26-1956  MR Number: [id number]  Location: [facility] [place] PHYSICAL MEDICINE AND REHABILITATION [place], [place] [id number] [place] [id number] Dept: [phone]  Start: 1 PM End: 2 PM  Today's visit was an in person visit that lasted for 1 hour and was conducted in my outpatient clinic office with the patient myself present.  Provider/Observer:     Edgardo Roys PsyD  Chief Complaint:      Chief Complaint  Patient presents with   Anxiety   Stress   Pain   Sleeping Problem     Reason For Service:     I have worked with the patient for several years now. She was initially referred here for difficulty coping with the situation involving her children. Her youngest son is at severe difficulties over the years with regard to mood disorder, substance abuse and behavioral problems. Now there are major stressors associated with him again the patient has been essentially overwhelmed and unsure about what to do about. She reports that this has created a lot of stress both at home and at work. On top of that, her diabetes has gotten to the point that she had hammertoe amputated because of that. She has neuropathy as a result of diabetes as well and has had to stay out of her foot for 8 weeks.  The psychosocial stressors are having significant negative impact upon her severe diabetes with neuropathy and loss of toe.  The patient continues to have recurrent issues with residual effects of her type 2 diabetes with significant medical complications.  The reason for service has been reviewed and remains applicable for the current visit.  The patient reports that she is still dealing with anxiety and depressive type symptomatology and continues to take her psychotropic medications including Cymbalta,  Trileptal and also taking Lyrica for peripheral neuropathy.   Interventions Strategy:  Cognitive/behavioral therapeutic interventions and working on issues related to stress and her brittle diabetes/pain issues.  The patient has continued to struggle with significant sleep difficulties and we also worked on sleep hygiene issues.  The patient previously been diagnosed with sleep apnea and prescribed CPAP but she developed ear difficulties that were associated with the CPAP device and is no longer able to use it.  The patient recently had a consult with Dr. Rexene Alberts regarding assessment for obstructive sleep apnea and she is being scheduled for formal sleep study and assessment of whether she would qualify for the inspire implanted device for her sleep apnea.  Participation Level:   Active  Participation Quality:  Appropriate      Behavioral Observation:  Well Groomed, Alert, and Appropriate.   Current Psychosocial Factors:   The patient has had an infection in her foot that required intervention but she is healing well from this intervention.  She is in a boot today.  The patient continues to worry about her youngest son who was involved in a motor vehicle accident and has struggled with an injury to his spleen as well as ongoing possible postconcussive symptoms.  Content of Session:   Reviewed current symptoms and continue to work on coping skills in dealing with both her medical issues as well as the stressors.  Current Status:   The patient reports that she has been able to finish her spinal cord stimulator surgery  and reports that she is experiencing a 50-60% improvement in her pain levels.     Last Reviewed:   08/01/2022  Goals Addressed Today:    Goals addressed included building better coping skills.  Impression/Diagnosis:   The patient has a long history of attention deficit disorder but due to Maj. psychosocial stressors she also developed clinical depression and anxiety. I think that her  attentional problems were valid prior to the development of these issues that existed her entire life. However, she is in and repeat if and recurrent trauma with regard primarily to her son and to her daughter to a lesser degree in years past.     Edgardo Roys, PsyD 08/07/2022

## 2022-08-11 DIAGNOSIS — F988 Other specified behavioral and emotional disorders with onset usually occurring in childhood and adolescence: Secondary | ICD-10-CM | POA: Diagnosis not present

## 2022-08-11 DIAGNOSIS — E1140 Type 2 diabetes mellitus with diabetic neuropathy, unspecified: Secondary | ICD-10-CM | POA: Diagnosis not present

## 2022-08-11 DIAGNOSIS — M86471 Chronic osteomyelitis with draining sinus, right ankle and foot: Secondary | ICD-10-CM | POA: Diagnosis not present

## 2022-08-11 DIAGNOSIS — E663 Overweight: Secondary | ICD-10-CM | POA: Diagnosis not present

## 2022-08-12 ENCOUNTER — Other Ambulatory Visit: Payer: Self-pay | Admitting: Family Medicine

## 2022-08-12 DIAGNOSIS — Z794 Long term (current) use of insulin: Secondary | ICD-10-CM

## 2022-08-16 NOTE — Telephone Encounter (Signed)
Noted, thank you

## 2022-08-16 NOTE — Telephone Encounter (Signed)
Received a fax from [facility] stating patient has started oral appliance therapy for snoring/OSA as of 07/26/22.

## 2022-08-17 DIAGNOSIS — H2511 Age-related nuclear cataract, right eye: Secondary | ICD-10-CM | POA: Diagnosis not present

## 2022-09-01 ENCOUNTER — Encounter: Payer: HMO | Attending: Psychology | Admitting: Psychology

## 2022-09-01 ENCOUNTER — Encounter: Payer: Self-pay | Admitting: Psychology

## 2022-09-01 DIAGNOSIS — E1142 Type 2 diabetes mellitus with diabetic polyneuropathy: Secondary | ICD-10-CM | POA: Insufficient documentation

## 2022-09-01 DIAGNOSIS — F908 Attention-deficit hyperactivity disorder, other type: Secondary | ICD-10-CM | POA: Diagnosis not present

## 2022-09-01 DIAGNOSIS — F5101 Primary insomnia: Secondary | ICD-10-CM | POA: Insufficient documentation

## 2022-09-01 DIAGNOSIS — F411 Generalized anxiety disorder: Secondary | ICD-10-CM | POA: Diagnosis not present

## 2022-09-01 NOTE — Progress Notes (Signed)
Patient ID: Rhonda Thomas, female   DOB: Feb 06, 1956, 66 y.o.   MRN: [id number] [name]   DOB: 1956-03-19  MR Number: [id number]  Location: [facility] FOR PAIN AND REHABILITATIVE MEDICINE [place] PHYSICAL MEDICINE AND REHABILITATION [place], [place] [id number] [place] [id number] Dept: [phone]  Start: 1 PM End: 2 PM  Today's visit was an in person visit that lasted for 1 hour and was conducted in my outpatient clinic office with the patient myself present.  Provider/Observer:     Edgardo Roys PsyD  Chief Complaint:      Chief Complaint  Patient presents with   Anxiety   ADHD   Pain   Stress     Reason For Service:     I have worked with the patient for several years now. She was initially referred here for difficulty coping with the situation involving her children. Her youngest son is at severe difficulties over the years with regard to mood disorder, substance abuse and behavioral problems. Now there are major stressors associated with him again the patient has been essentially overwhelmed and unsure about what to do about. She reports that this has created a lot of stress both at home and at work. On top of that, her diabetes has gotten to the point that she had hammertoe amputated because of that. She has neuropathy as a result of diabetes as well and has had to stay out of her foot for 8 weeks.  The psychosocial stressors are having significant negative impact upon her severe diabetes with neuropathy and loss of toe.  The patient continues to have recurrent issues with residual effects of her type 2 diabetes with significant medical complications.  The reason for service has been reviewed and remains applicable for the current visit.  The patient reports that she is still dealing with anxiety and depressive type symptomatology and continues to take her psychotropic medications including Cymbalta, Trileptal and also  taking Lyrica for peripheral neuropathy.   Interventions Strategy:  Cognitive/behavioral therapeutic interventions and working on issues related to stress and her brittle diabetes/pain issues.  The patient has continued to struggle with significant sleep difficulties and we also worked on sleep hygiene issues.  The patient previously been diagnosed with sleep apnea and prescribed CPAP but she developed ear difficulties that were associated with the CPAP device and is no longer able to use it.  The patient recently had a consult with Dr. Rexene Alberts regarding assessment for obstructive sleep apnea and she is being scheduled for formal sleep study and assessment of whether she would qualify for the inspire implanted device for her sleep apnea.  Participation Level:   Active  Participation Quality:  Appropriate      Behavioral Observation:  Well Groomed, Alert, and Appropriate.   Current Psychosocial Factors:   The patient reports that she has recently had surgery which she had just had when I saw her last month.  She reports that she is recovering well and is now wearing a regular shoe.  The patient reports that the pain is gotten much better postsurgery over time.  The patient reports that she continues to have difficulty following dietary plans set up for her diabetes and tends to only eat 1 time per day.  Some of this is a factor of the relationship between how her husband and herself eat throughout the day.  The patient continues to have a lot of psychosocial stressors with all 3  of her children having some degree of difficulties in her life.  Content of Session:   Reviewed current symptoms and continue to work on coping skills in dealing with both her medical issues as well as the stressors.  Current Status:   The patient reports that she has been able to finish her spinal cord stimulator surgery and reports that she is experiencing a 50-60% improvement in her pain levels.     Last  Reviewed:   09/01/2022  Goals Addressed Today:    Goals addressed included building better coping skills.  Impression/Diagnosis:   The patient has a long history of attention deficit disorder but due to Maj. psychosocial stressors she also developed clinical depression and anxiety. I think that her attentional problems were valid prior to the development of these issues that existed her entire life. However, she is in and repeat if and recurrent trauma with regard primarily to her son and to her daughter to a lesser degree in years past.     Edgardo Roys, PsyD 09/01/2022

## 2022-09-05 ENCOUNTER — Telehealth: Payer: Self-pay | Admitting: Neurology

## 2022-09-05 NOTE — Telephone Encounter (Signed)
Michele Mcalpine O'bryant from Dr Oneal Grout office called and LVM stating that she is needing an RN to call her back at earliest convenience to discuss pt.

## 2022-09-06 NOTE — Telephone Encounter (Signed)
I called Kimmel back and LVM on her phone at Dr Kae Heller office.

## 2022-09-08 DIAGNOSIS — L97511 Non-pressure chronic ulcer of other part of right foot limited to breakdown of skin: Secondary | ICD-10-CM | POA: Diagnosis not present

## 2022-09-08 DIAGNOSIS — E1140 Type 2 diabetes mellitus with diabetic neuropathy, unspecified: Secondary | ICD-10-CM | POA: Diagnosis not present

## 2022-09-08 NOTE — Telephone Encounter (Signed)
Spoke with [facility]. She had insurance questions which I was able to answer.

## 2022-09-30 ENCOUNTER — Ambulatory Visit (INDEPENDENT_AMBULATORY_CARE_PROVIDER_SITE_OTHER): Payer: HMO

## 2022-09-30 DIAGNOSIS — Z1211 Encounter for screening for malignant neoplasm of colon: Secondary | ICD-10-CM

## 2022-09-30 DIAGNOSIS — Z0000 Encounter for general adult medical examination without abnormal findings: Secondary | ICD-10-CM

## 2022-09-30 NOTE — Patient Instructions (Signed)
[name] , Thank you for taking time to come for your Medicare Wellness Visit. I appreciate your ongoing commitment to your health goals. Please review the following plan we discussed and let me know if I can assist you in the future.   These are the goals we discussed:  Goals       DIET - REDUCE SODIUM INTAKE      Follow up with Primary Care Provider      Patient Stated      .aw      Patient Stated      09/30/2022 AWV Goal: Fall Prevention  Over the next year, patient will decrease their risk for falls by: Using assistive devices, such as a cane or walker, as needed Identifying fall risks within their home and correcting them by: Removing throw rugs Adding handrails to stairs or ramps Removing clutter and keeping a clear pathway throughout the home Increasing light, especially at night Adding shower handles/bars Raising toilet seat Identifying potential personal risk factors for falls: Medication side effects Incontinence/urgency Vestibular dysfunction Hearing loss Musculoskeletal disorders Neurological disorders Orthostatic hypotension        T2DM, HLD PHARMD GOAL (pt-stated)      Current Barriers:  Unable to independently afford treatment regimen Unable to achieve control of T2DM   Pharmacist Clinical Goal(s):  patient will verbalize ability to afford treatment regimen achieve control of T2DM as evidenced by IMPROVED GLYCEMIC CONTROL, GOAL A1C<7% maintain control of T2DM/HLD as evidenced by GOAL A1C<7%, LIPID PANEL GOALS  through collaboration with PharmD and provider.   Interventions: 1:1 collaboration with Dettinger, Fransisca Kaufmann, MD regarding development and update of comprehensive plan of care as evidenced by provider attestation and co-signature Inter-disciplinary care team collaboration (see longitudinal plan of care) Comprehensive medication review performed; medication list updated in electronic medical record  Diabetes: Goal on Track (progressing):  YES. Uncontrolled-A1C 7.4%; current treatment: BASAGLAR, NOVOLOG, TRULICITY, METFORMIN;  Continue current insulin and metformin INCREASE TO Trulicity to 4.'5mg'$   Denies personal and family history of Medullary thyroid cancer (MTC) Discussed with patient that she may be able to reduce her insulin at meal times and basal if requirements drop with increase of trulicity Current glucose readings: fasting glucose: <155, post prandial glucose: <200s  USES DEXCOM G6 CGM (PER INSURANCE) Denies hypoglycemic/hyperglycemic symptoms Discussed meal planning options and Plate method for healthy eating Avoid sugary drinks and desserts Incorporate balanced protein, non starchy veggies, 1 serving of carbohydrate with each meal Increase water intake Increase physical activity as able Current exercise: N/A Assessed patient finances. Patient has dual state health plan commercial and medicare and is therefore not eligible for copay cards from manufacturers.  She is also not eligible for patient assistance programs.  Called [facility] and her insulin copays are $0 copays per month.  Her insurance prefers [employer] for basal insulin and Novolog for rapid/meal time (corrected and sent in).  Trulicity is $79/YIAXK  Hyperlipidemia:  Goal on Track (progressing): YES. Controlled; current treatment:rosuvastatin '5mg'$  daily;  LDL currently at goal, however triglycerides elevated; dietary changes necessary Medications previously tried: n/a  Current dietary patterns: encouraged heart healthy diet Educated on diet/heart healthy options   Patient Goals/Self-Care Activities patient will:  - take medications as prescribed as evidenced by patient report and record review check glucose continuously using CGM, document, and provide at future appointments engage in dietary modifications by following heart healthy diet         This is a list of the screening recommended for you  and due dates:  Health Maintenance  Topic Date  Due   COVID-19 Vaccine (1) Never done   Yearly kidney health urinalysis for diabetes  01/13/2021   Yearly kidney function blood test for diabetes  06/21/2022   Colon Cancer Screening  09/10/2022   Zoster (Shingles) Vaccine (2 of 2) 09/23/2022   Flu Shot  03/12/2023*   Pneumonia Vaccine (2 - PPSV23 or PCV20) 07/30/2023*   Hemoglobin A1C  12/21/2022   Complete foot exam   07/30/2023   Eye exam for diabetics  08/02/2023   Mammogram  02/03/2024   Tetanus Vaccine  06/01/2027   DEXA scan (bone density measurement)  Completed   Hepatitis C Screening: USPSTF Recommendation to screen - Ages 13-79 yo.  Completed   HPV Vaccine  Aged Out  *Topic was postponed. The date shown is not the original due date.

## 2022-09-30 NOTE — Progress Notes (Signed)
Subjective:   Rhonda Thomas is a 66 y.o. female who presents for Medicare Annual (Subsequent) preventive examination.  I connected with  Rhonda Thomas on 09/30/22 by a audio enabled telemedicine application and verified that I am speaking with the correct person using two identifiers.  Patient Location: Home  Provider Location: Office/Clinic  I discussed the limitations of evaluation and management by telemedicine. The patient expressed understanding and agreed to proceed.   Nutrition Risk Assessment:  Has the patient had any N/V/D within the last 2 months?  No  Does the patient have any non-healing wounds?  No  Has the patient had any unintentional weight loss or weight gain?  No   Diabetes:  Is the patient diabetic?  Yes  If diabetic, was a CBG obtained today?  No  Did the patient bring in their glucometer from home?  No  How often do you monitor your CBG's? daily   Financial Strains and Diabetes Management:  Are you having any financial strains with the device, your supplies or your medication? No .  Does the patient want to be seen by Chronic Care Management for management of their diabetes?  No  Would the patient like to be referred to a Nutritionist or for Diabetic Management?  No   Diabetic Exams:  Diabetic Eye Exam: Patient had eye exam on 08/31/2022., results pending.  Diabetic Foot Exam: Completed      Review of Systems    Defer to PCP  Cardiac Risk Factors include: advanced age (>36mn, >>63women);diabetes mellitus;dyslipidemia     Objective:    There were no vitals filed for this visit. There is no height or weight on file to calculate BMI.     09/30/2022    9:16 AM 09/29/2021    9:11 AM 06/26/2019    9:58 AM 07/20/2018   10:20 AM 04/14/2016    4:38 PM 04/29/2015   11:35 AM  Advanced Directives  Does Patient Have a Medical Advance Directive? No No No No No No  Would patient like information on creating a medical advance directive? No -  Patient declined No - Patient declined  No - Patient declined  Yes - Educational materials given    Current Medications (verified) Outpatient Encounter Medications as of 09/30/2022  Medication Sig   aspirin EC 81 MG tablet Take 1 tablet by mouth daily.   BELBUCA 300 MCG FILM Take 1 Film by mouth every 12 (twelve) hours.   carbidopa-levodopa (SINEMET IR) 25-100 MG tablet Take 1 tablet by mouth in the morning and at bedtime.   Continuous Blood Gluc Receiver (FREESTYLE LIBRE 2 READER) DEVI Use w/ Libre sensor to check BS Dx E11.621   Continuous Blood Gluc Sensor (FREESTYLE LIBRE 2 SENSOR) MISC 1 each by Does not apply route every 14 (fourteen) days.   DULoxetine (CYMBALTA) 60 MG capsule Take 1 capsule (60 mg total) by mouth at bedtime.   fluconazole (DIFLUCAN) 150 MG tablet Take 150 mg by mouth daily.   ibuprofen (ADVIL) 800 MG tablet Take 800 mg by mouth every 8 (eight) hours as needed.   insulin aspart (NOVOLOG FLEXPEN) 100 UNIT/ML FlexPen Inject 3-20 Units into the skin 3 times/day as needed-between meals & bedtime for high blood sugar.   Insulin Glargine (BASAGLAR KWIKPEN) 100 UNIT/ML Inject 30 Units into the skin daily.   Insulin Pen Needle (GLOBAL EASE INJECT PEN NEEDLES) 31G X 5 MM MISC USE FOUR TIMES DAILY   metFORMIN (GLUCOPHAGE) 500 MG tablet Take 1  tablet (500 mg total) by mouth 2 (two) times daily with a meal.   OXcarbazepine (TRILEPTAL) 150 MG tablet Take 1 twice daily   pregabalin (LYRICA) 150 MG capsule 1 in am 2 at night   rosuvastatin (CRESTOR) 5 MG tablet Take 1 tablet (5 mg total) by mouth at bedtime.   TRULICITY 4.5 EX/9.3ZJ SOPN INJECT 4.5 MG into THE SKIN ONCE WEEKLY   rOPINIRole (REQUIP) 1 MG tablet TAKE 1 TABLET BY MOUTH AT BEDTIME   VITAMIN D PO Take 1,000 Units by mouth daily. (Patient not taking: Reported on 09/30/2022)   [DISCONTINUED] ASPIRIN LOW DOSE 81 MG EC tablet TAKE 1 TABLET BY MOUTH EVERY DAY   No facility-administered encounter medications on file as of  09/30/2022.    Allergies (verified) Canagliflozin and Pioglitazone   History: Past Medical History:  Diagnosis Date   Anxiety    Attention deficit disorder (ADD)    Depression    Diabetes ([place])    Hiatal hernia    Movement disorder    Neuropathy    Schatzki's ring    Sleep apnea    Past Surgical History:  Procedure Laterality Date   ABDOMINAL HYSTERECTOMY     BREAST LUMPECTOMY WITH RADIOACTIVE SEED LOCALIZATION Right 04/25/2016   Procedure: RIGHT BREAST LUMPECTOMY WITH RADIOACTIVE SEED LOCALIZATION;  Surgeon: Autumn Messing III, MD;  Location: [place];  Service: General;  Laterality: Right;   CATARACT EXTRACTION, BILATERAL Bilateral    CHOLECYSTECTOMY     RECTOCELE REPAIR     SPINAL CORD STIMULATOR IMPLANT     TOE AMPUTATION Right    2nd toe rt foot   TUBAL LIGATION     Family History  Problem Relation Age of Onset   Stroke Mother    Sleep apnea Mother    Diabetes Father    Heart disease Father        pacemaker   Sleep apnea Father    Diabetes Other    Heart Problems Other    Colon cancer Neg Hx    Breast cancer Neg Hx    Social History   Socioeconomic History   Marital status: Married    Spouse name: Rhonda Thomas   Number of children: 3   Years of education: college   Highest education level: Master's degree (e.g., MA, MS, MEng, MEd, MSW, MBA)  Occupational History   Occupation: Retired    Fish farm manager: [employer]    Comment: [place]  Tobacco Use   Smoking status: Never   Smokeless tobacco: Never  Scientific laboratory technician Use: Never used  Substance and Sexual Activity   Alcohol use: No    Alcohol/week: 0.0 standard drinks of alcohol   Drug use: Never   Sexual activity: Yes    Birth control/protection: Post-menopausal  Other Topics Concern   Not on file  Social History Narrative   Patient lives at home with her husband. Rhonda Thomas).   Education- College   Right handed.   Caffeine- occasional soft drink      Social  Determinants of Health   Financial Resource Strain: Low Risk  (09/30/2022)   Overall Financial Resource Strain (CARDIA)    Difficulty of Paying Living Expenses: Not hard at all  Food Insecurity: No Food Insecurity (09/30/2022)   Hunger Vital Sign    Worried About Running Out of Food in the Last Year: Never true    Ran Out of Food in the Last Year: Never true  Transportation Needs: No Transportation Needs (  09/30/2022)   PRAPARE - Hydrologist (Medical): No    Lack of Transportation (Non-Medical): No  Physical Activity: Insufficiently Active (09/30/2022)   Exercise Vital Sign    Days of Exercise per Week: 7 days    Minutes of Exercise per Session: 20 min  Stress: No Stress Concern Present (09/30/2022)   [place]    Feeling of Stress : Not at all  Social Connections: Moderately Isolated (09/30/2022)   Social Connection and Isolation Panel [NHANES]    Frequency of Communication with Friends and Family: More than three times a week    Frequency of Social Gatherings with Friends and Family: Once a week    Attends Religious Services: Never    Marine scientist or Organizations: No    Attends Music therapist: Never    Marital Status: Married    Tobacco Counseling Counseling given: Not Answered   Clinical Intake:  Pre-visit preparation completed: Yes  Pain : No/denies pain    Nutritional Risks: None Diabetes: Yes CBG done?: No Did pt. bring in CBG monitor from home?: No  How often do you need to have someone help you when you read instructions, pamphlets, or other written materials from your doctor or pharmacy?: 3 - Sometimes (because of eyesight, recent surgery) What is the last grade level you completed in school?: master's degree in education  Diabetic? Yes  Interpreter Needed?: No  Information entered by :: Felicity Coyer LPN   Activities of Daily  Living    09/30/2022    9:17 AM  In your present state of health, do you have any difficulty performing the following activities:  Hearing? 0  Vision? 1  Comment recent cataract surgery  Difficulty concentrating or making decisions? 0  Walking or climbing stairs? 1  Comment recent surgery on foot  Dressing or bathing? 0  Doing errands, shopping? 0  Preparing Food and eating ? N  Using the Toilet? N  In the past six months, have you accidently leaked urine? Y  Comment at night  Do you have problems with loss of bowel control? N  Managing your Medications? N  Managing your Finances? Y  Comment husband takes care of finances  Housekeeping or managing your Housekeeping? N    Patient Care Team: Dettinger, Fransisca Kaufmann, MD as PCP - General (Family Medicine) Lucia Bitter., MD as Physician Assistant (Pain Medicine) Lavera Guise, [facility] as Pharmacist (Family Medicine)  Indicate any recent Medical Services you may have received from other than [facility] providers in the past year (date may be approximate).    Assessment:   This is a routine wellness examination for [employer].  Hearing/Vision screen No results found.  Dietary issues and exercise activities discussed: Current Exercise Habits: Home exercise routine, Type of exercise: walking, Time (Minutes): 20, Frequency (Times/Week): 7, Weekly Exercise (Minutes/Week): 140, Intensity: Mild, Exercise limited by: Other - see comments (partial foot amputation)   Goals Addressed             This Visit's Progress    Patient Stated       .aw     Patient Stated       09/30/2022 AWV Goal: Fall Prevention  Over the next year, patient will decrease their risk for falls by: Using assistive devices, such as a cane or walker, as needed Identifying fall risks within their home and correcting them by: Removing throw rugs Adding handrails to  stairs or ramps Removing clutter and keeping a clear pathway throughout the home Increasing light,  especially at night Adding shower handles/bars Raising toilet seat Identifying potential personal risk factors for falls: Medication side effects Incontinence/urgency Vestibular dysfunction Hearing loss Musculoskeletal disorders Neurological disorders Orthostatic hypotension         Depression Screen    09/30/2022    9:13 AM 07/29/2022    3:12 PM 05/02/2022    3:30 PM 09/29/2021    9:09 AM 06/21/2021    3:50 PM 05/17/2021    1:52 PM 10/26/2020   11:43 AM  PHQ 2/9 Scores  PHQ - 2 Score 0 2 0 '2 2 2 '$ 0  PHQ- 9 Score  '9  5  5     '$ Fall Risk    09/30/2022    9:25 AM 09/30/2022    9:24 AM 07/29/2022    3:12 PM 05/02/2022    3:30 PM 09/29/2021    9:11 AM  [place] in the past year? '1 1 1 1 1  '$ Number falls in past yr: '1  1 1 1  '$ Injury with Fall? 0  0 0 1  Risk for fall due to : History of fall(s);Impaired balance/gait;Impaired mobility  Impaired balance/gait;Impaired mobility;Orthopedic patient Impaired balance/gait History of fall(s);Impaired balance/gait;Orthopedic patient;Medication side effect  Follow up Falls evaluation completed  Falls evaluation completed Falls evaluation completed Education provided;Falls prevention discussed    FALL RISK PREVENTION PERTAINING TO THE HOME:  Any stairs in or around the home? Yes  If so, are there any without handrails? Yes  Home free of loose throw rugs in walkways, pet beds, electrical cords, etc? Yes  Adequate lighting in your home to reduce risk of falls? Yes   ASSISTIVE DEVICES UTILIZED TO PREVENT FALLS:  Life alert? No  Use of a cane, walker or w/c? No  Grab bars in the bathroom? No  Shower chair or bench in shower? No  Elevated toilet seat or a handicapped toilet? No    Cognitive Function:    07/20/2018   10:23 AM  MMSE - Mini Mental State Exam  Orientation to time 5  Orientation to Place 5  Registration 3  Attention/ Calculation 5  Recall 3  Language- name 2 objects 2  Language- repeat 1  Language- follow  3 step command 3  Language- read & follow direction 1  Write a sentence 1  Copy design 1  Total score 30        09/30/2022    9:20 AM 09/29/2021    9:18 AM  6CIT Screen  What Year? 0 points 0 points  What month? 0 points 0 points  What time? 3 points 0 points  Count back from 20 0 points 0 points  Months in reverse 0 points 0 points  Repeat phrase 0 points 2 points  Total Score 3 points 2 points    Immunizations Immunization History  Administered Date(s) Administered   Fluad Quad(high Dose 65+) 09/23/2021   Influenza, Seasonal, Injecte, Preservative Fre 09/28/2015   Influenza,inj,Quad PF,6+ Mos 09/17/2014, 11/29/2016, 09/29/2017, 10/11/2018, 11/21/2019, 09/14/2020   Influenza-Unspecified 09/10/2013   Pneumococcal Conjugate-13 11/07/2017   Tdap 05/31/2017   Zoster Recombinat (Shingrix) 07/29/2022   Zoster, Live 06/23/2015    TDAP status: Up to date  Flu Vaccine status: Due, Education has been provided regarding the importance of this vaccine. Advised may receive this vaccine at local pharmacy or Health Dept. Aware to provide a copy of the vaccination record if obtained  from local pharmacy or Health Dept. Verbalized acceptance and understanding.  Pneumococcal vaccine status: Due, Education has been provided regarding the importance of this vaccine. Advised may receive this vaccine at local pharmacy or Health Dept. Aware to provide a copy of the vaccination record if obtained from local pharmacy or Health Dept. Verbalized acceptance and understanding.  Covid-19 vaccine status: Information provided on how to obtain vaccines.   Qualifies for Shingles Vaccine? Yes   Zostavax completed No   Shingrix Completed?: No.    Education has been provided regarding the importance of this vaccine. Patient has been advised to call insurance company to determine out of pocket expense if they have not yet received this vaccine. Advised may also receive vaccine at local pharmacy or Health Dept.  Verbalized acceptance and understanding.  Screening Tests Health Maintenance  Topic Date Due   COVID-19 Vaccine (1) Never done   Diabetic kidney evaluation - Urine ACR  01/13/2021   Diabetic kidney evaluation - GFR measurement  06/21/2022   COLONOSCOPY (Pts 45-41yr Insurance coverage will need to be confirmed)  09/10/2022   Zoster Vaccines- Shingrix (2 of 2) 09/23/2022   INFLUENZA VACCINE  03/12/2023 (Originally 07/12/2022)   Pneumonia Vaccine 66 Years old (2 - PPSV23 or PCV20) 07/30/2023 (Originally 08/10/2021)   HEMOGLOBIN A1C  12/21/2022   FOOT EXAM  07/30/2023   OPHTHALMOLOGY EXAM  08/02/2023   MAMMOGRAM  02/03/2024   TETANUS/TDAP  06/01/2027   DEXA SCAN  Completed   Hepatitis C Screening  Completed   HPV VACCINES  Aged Out    Health Maintenance  Health Maintenance Due  Topic Date Due   COVID-19 Vaccine (1) Never done   Diabetic kidney evaluation - Urine ACR  01/13/2021   Diabetic kidney evaluation - GFR measurement  06/21/2022   COLONOSCOPY (Pts 45-464yrInsurance coverage will need to be confirmed)  09/10/2022   Zoster Vaccines- Shingrix (2 of 2) 09/23/2022    Colorectal cancer screening: Referral to GI placed 10/20/202. Pt aware the office will call re: appt.  Mammogram status: Completed 02/03/22 . Repeat every year  Bone Density status: Completed 02/02/22. Results reflect: Bone density results: NORMAL. Repeat every two years.  Lung Cancer Screening: (Low Dose CT Chest recommended if Age 66-80ears, 30 pack-year currently smoking OR have quit w/in 15years.) does not qualify.   Lung Cancer Screening Referral: N/A  Additional Screening:  Hepatitis C Screening: does not qualify  Vision Screening: Recommended annual ophthalmology exams for early detection of glaucoma and other disorders of the eye. Is the patient up to date with their annual eye exam?  Yes  Who is the provider or what is the name of the office in which the patient attends annual eye exams? [facility] is not established with a provider, would they like to be referred to a provider to establish care?  N/A .   Dental Screening: Recommended annual dental exams for proper oral hygiene  Community Resource Referral / Chronic Care Management: CRR required this visit?  No   CCM required this visit?  No     Plan:     I have personally reviewed and noted the following in the patient's chart:   Medical and social history Use of alcohol, tobacco or illicit drugs  Current medications and supplements including opioid prescriptions. Patient is not currently taking opioid prescriptions. Functional ability and status Nutritional status Physical activity Advanced directives List of other physicians Hospitalizations, surgeries, and ER visits in previous 12 months  Vitals Screenings to include cognitive, depression, and falls Referrals and appointments  In addition, I have reviewed and discussed with patient certain preventive protocols, quality metrics, and best practice recommendations. A written personalized care plan for preventive services as well as general preventive health recommendations were provided to patient.     Burnadette Pop, LPN   03/47/4259   Nurse Notes: Referral placed for gastroenterologist, patient is due for screening colonoscopy.  Patient still complains of symptoms of narcolepsy, falls caused by falling asleep.  Patient declined after visit summary.

## 2022-10-17 ENCOUNTER — Other Ambulatory Visit: Payer: Self-pay | Admitting: Neurology

## 2022-10-17 ENCOUNTER — Encounter: Payer: HMO | Attending: Psychology | Admitting: Psychology

## 2022-10-17 ENCOUNTER — Other Ambulatory Visit: Payer: Self-pay | Admitting: Family Medicine

## 2022-10-17 DIAGNOSIS — F411 Generalized anxiety disorder: Secondary | ICD-10-CM

## 2022-10-17 DIAGNOSIS — F908 Attention-deficit hyperactivity disorder, other type: Secondary | ICD-10-CM

## 2022-10-17 DIAGNOSIS — F5101 Primary insomnia: Secondary | ICD-10-CM | POA: Diagnosis not present

## 2022-10-17 DIAGNOSIS — E1142 Type 2 diabetes mellitus with diabetic polyneuropathy: Secondary | ICD-10-CM | POA: Diagnosis not present

## 2022-10-18 ENCOUNTER — Other Ambulatory Visit: Payer: Self-pay | Admitting: Neurology

## 2022-10-18 ENCOUNTER — Other Ambulatory Visit: Payer: Self-pay | Admitting: Family Medicine

## 2022-10-18 DIAGNOSIS — E1142 Type 2 diabetes mellitus with diabetic polyneuropathy: Secondary | ICD-10-CM

## 2022-10-21 ENCOUNTER — Other Ambulatory Visit: Payer: Self-pay | Admitting: Family Medicine

## 2022-10-24 ENCOUNTER — Other Ambulatory Visit: Payer: Self-pay | Admitting: Family Medicine

## 2022-10-28 ENCOUNTER — Ambulatory Visit (INDEPENDENT_AMBULATORY_CARE_PROVIDER_SITE_OTHER): Payer: HMO | Admitting: Family Medicine

## 2022-10-28 ENCOUNTER — Encounter: Payer: Self-pay | Admitting: Family Medicine

## 2022-10-28 VITALS — BP 127/72 | HR 81 | Temp 98.0°F | Ht 69.0 in | Wt 195.0 lb

## 2022-10-28 DIAGNOSIS — Z23 Encounter for immunization: Secondary | ICD-10-CM

## 2022-10-28 DIAGNOSIS — E785 Hyperlipidemia, unspecified: Secondary | ICD-10-CM

## 2022-10-28 DIAGNOSIS — F411 Generalized anxiety disorder: Secondary | ICD-10-CM

## 2022-10-28 DIAGNOSIS — Z794 Long term (current) use of insulin: Secondary | ICD-10-CM

## 2022-10-28 DIAGNOSIS — E113293 Type 2 diabetes mellitus with mild nonproliferative diabetic retinopathy without macular edema, bilateral: Secondary | ICD-10-CM

## 2022-10-28 DIAGNOSIS — E119 Type 2 diabetes mellitus without complications: Secondary | ICD-10-CM | POA: Insufficient documentation

## 2022-10-28 DIAGNOSIS — E1149 Type 2 diabetes mellitus with other diabetic neurological complication: Secondary | ICD-10-CM | POA: Diagnosis not present

## 2022-10-28 DIAGNOSIS — L97522 Non-pressure chronic ulcer of other part of left foot with fat layer exposed: Secondary | ICD-10-CM

## 2022-10-28 DIAGNOSIS — E11621 Type 2 diabetes mellitus with foot ulcer: Secondary | ICD-10-CM | POA: Diagnosis not present

## 2022-10-28 DIAGNOSIS — F331 Major depressive disorder, recurrent, moderate: Secondary | ICD-10-CM

## 2022-10-28 DIAGNOSIS — E1142 Type 2 diabetes mellitus with diabetic polyneuropathy: Secondary | ICD-10-CM | POA: Diagnosis not present

## 2022-10-28 LAB — CBC WITH DIFFERENTIAL/PLATELET
Basophils Absolute: 0 10*3/uL (ref 0.0–0.2)
Basos: 1 %
EOS (ABSOLUTE): 0.2 10*3/uL (ref 0.0–0.4)
Eos: 3 %
Hematocrit: 37.4 % (ref 34.0–46.6)
Hemoglobin: 12.1 g/dL (ref 11.1–15.9)
Immature Grans (Abs): 0 10*3/uL (ref 0.0–0.1)
Immature Granulocytes: 0 %
Lymphocytes Absolute: 1.5 10*3/uL (ref 0.7–3.1)
Lymphs: 30 %
MCH: 28.8 pg (ref 26.6–33.0)
MCHC: 32.4 g/dL (ref 31.5–35.7)
MCV: 89 fL (ref 79–97)
Monocytes Absolute: 0.4 10*3/uL (ref 0.1–0.9)
Monocytes: 8 %
Neutrophils Absolute: 2.8 10*3/uL (ref 1.4–7.0)
Neutrophils: 58 %
Platelets: 209 10*3/uL (ref 150–450)
RBC: 4.2 x10E6/uL (ref 3.77–5.28)
RDW: 13.8 % (ref 11.7–15.4)
WBC: 4.9 10*3/uL (ref 3.4–10.8)

## 2022-10-28 LAB — BMP8+EGFR
BUN/Creatinine Ratio: 21 (ref 12–28)
BUN: 19 mg/dL (ref 8–27)
CO2: 26 mmol/L (ref 20–29)
Calcium: 9.3 mg/dL (ref 8.7–10.3)
Chloride: 103 mmol/L (ref 96–106)
Creatinine, Ser: 0.89 mg/dL (ref 0.57–1.00)
Glucose: 105 mg/dL — ABNORMAL HIGH (ref 70–99)
Potassium: 5 mmol/L (ref 3.5–5.2)
Sodium: 140 mmol/L (ref 134–144)
eGFR: 71 mL/min/{1.73_m2} (ref >59)

## 2022-10-28 LAB — BAYER DCA HB A1C WAIVED: HB A1C (BAYER DCA - WAIVED): 6.2 % — ABNORMAL HIGH (ref 4.8–5.6)

## 2022-10-28 MED ORDER — NOVOLOG FLEXPEN 100 UNIT/ML ~~LOC~~ SOPN: 3.0000 [IU] | PEN_INJECTOR | Freq: Three times a day (TID) | SUBCUTANEOUS | 3 refills | Status: DC

## 2022-10-28 MED ORDER — BASAGLAR KWIKPEN 100 UNIT/ML ~~LOC~~ SOPN: 30.0000 [IU] | PEN_INJECTOR | Freq: Every day | SUBCUTANEOUS | 3 refills | Status: DC

## 2022-10-28 NOTE — Progress Notes (Signed)
BP 127/72   Pulse 81   Temp 98 F (36.7 C)   Ht 5' 9" (1.753 m)   Wt 195 lb (88.5 kg)   SpO2 96%   BMI 28.80 kg/m    Subjective:   Patient ID: Rhonda Thomas, female    DOB: 12-Feb-1956, 66 y.o.   MRN: [id number]  HPI: Rhonda Thomas is a 66 y.o. female presenting on 10/28/2022 for Medical Management of Chronic Issues and Diabetes   HPI Type 2 diabetes mellitus Patient comes in today for recheck of his diabetes. Patient has been currently taking metformin and Trulicity and NovoLog and Engineer, agricultural. Patient is not currently on an ACE inhibitor/ARB. Patient has not seen an ophthalmologist this year. Patient denies any issues with their feet. The symptom started onset as an adult hypertension and hyperlipidemia ARE RELATED TO DM   Hypertension Patient is currently on no medicine currently, and their blood pressure today is 127/72. Patient denies any lightheadedness or dizziness. Patient denies headaches, blurred vision, chest pains, shortness of breath, or weakness. Denies any side effects from medication and is content with current medication.   Hyperlipidemia Patient is coming in for recheck of his hyperlipidemia. The patient is currently taking Crestor. They deny any issues with myalgias or history of liver damage from it. They deny any focal numbness or weakness or chest pain.   Anxiety depression recheck Patient is coming in for anxiety and depression recheck.  She currently takes Cymbalta and ropinirole to help with this and restless legs and feels like they are helping.  She is little bit stressed right now because her husband ended up in the hospital last night for strokelike symptoms but normally she does okay.  Relevant past medical, surgical, family and social history reviewed and updated as indicated. Interim medical history since our last visit reviewed. Allergies and medications reviewed and updated.  Review of Systems  Constitutional:  Negative for chills and  fever.  Eyes:  Negative for redness and visual disturbance.  Respiratory:  Negative for chest tightness and shortness of breath.   Cardiovascular:  Negative for chest pain and leg swelling.  Genitourinary:  Negative for difficulty urinating and dysuria.  Musculoskeletal:  Negative for back pain and gait problem.  Skin:  Negative for rash.  Neurological:  Negative for light-headedness and headaches.  Psychiatric/Behavioral:  Negative for agitation and behavioral problems.   All other systems reviewed and are negative.   Per HPI unless specifically indicated above   Allergies as of 10/28/2022       Reactions   Canagliflozin Other (See Comments)   vaginitis vaginitis   Pioglitazone Other (See Comments)   Edema Edema        Medication List        Accurate as of October 28, 2022 11:04 AM. If you have any questions, ask your nurse or doctor.          aspirin EC 81 MG tablet Take 1 tablet by mouth daily.   Basaglar KwikPen 100 UNIT/ML Inject 30 Units into the skin daily.   Belbuca 300 MCG Film Generic drug: Buprenorphine HCl Take 1 Film by mouth every 12 (twelve) hours.   carbidopa-levodopa 25-100 MG tablet Commonly known as: SINEMET IR Take 1 tablet by mouth in the morning and at bedtime.   DULoxetine 60 MG capsule Commonly known as: CYMBALTA Take 1 capsule (60 mg total) by mouth at bedtime.   fluconazole 150 MG tablet Commonly known as: DIFLUCAN Take 150 mg by  mouth daily.   FreeStyle Francis 2 Reader Kerrin Mo Use w/ Libre sensor to [employer] Dx E11.621   FreeStyle Libre 2 Sensor Misc 1 each by Does not apply route every 14 (fourteen) days.   ibuprofen 800 MG tablet Commonly known as: ADVIL Take 800 mg by mouth every 8 (eight) hours as needed.   Insulin Pen Needle 31G X 5 MM Misc Commonly known as: Global Ease Inject Pen Needles USE FOUR TIMES DAILY   metFORMIN 500 MG tablet Commonly known as: GLUCOPHAGE Take 1 tablet (500 mg total) by mouth 2 (two)  times daily with a meal.   NovoLOG FlexPen 100 UNIT/ML FlexPen Generic drug: insulin aspart Inject 3-20 Units into the skin 3 (three) times daily with meals. What changed: See the new instructions. Changed by: Fransisca Kaufmann Hermie Reagor, MD   OXcarbazepine 150 MG tablet Commonly known as: TRILEPTAL TAKE 1 TABLET BY MOUTH TWICE DAILY   pregabalin 150 MG capsule Commonly known as: LYRICA 1 in am 2 at night   rOPINIRole 1 MG tablet Commonly known as: REQUIP TAKE 1 TABLET BY MOUTH AT BEDTIME   rosuvastatin 5 MG tablet Commonly known as: CRESTOR Take 1 tablet (5 mg total) by mouth at bedtime.   Trulicity 4.5 SN/0.5LZ Sopn Generic drug: Dulaglutide INJECT 4.5 MG into THE SKIN ONCE WEEKLY   VITAMIN D PO Take 1,000 Units by mouth daily.         Objective:   BP 127/72   Pulse 81   Temp 98 F (36.7 C)   Ht 5' 9" (1.753 m)   Wt 195 lb (88.5 kg)   SpO2 96%   BMI 28.80 kg/m   Wt Readings from Last 3 Encounters:  10/28/22 195 lb (88.5 kg)  07/29/22 195 lb (88.5 kg)  05/02/22 191 lb (86.6 kg)    Physical Exam Vitals and nursing note reviewed.  Constitutional:      General: She is not in acute distress.    Appearance: She is well-developed. She is not diaphoretic.  Eyes:     Conjunctiva/sclera: Conjunctivae normal.  Cardiovascular:     Rate and Rhythm: Normal rate and regular rhythm.     Heart sounds: Normal heart sounds. No murmur heard. Pulmonary:     Effort: Pulmonary effort is normal. No respiratory distress.     Breath sounds: Normal breath sounds. No wheezing.  Musculoskeletal:        General: No swelling or tenderness. Normal range of motion.  Skin:    General: Skin is warm and dry.     Findings: No rash.  Neurological:     Mental Status: She is alert and oriented to person, place, and time.     Coordination: Coordination normal.  Psychiatric:        Behavior: Behavior normal.     Results for orders placed or performed in visit on 08/01/22  HM DIABETES  EYE EXAM  Result Value Ref Range   HM Diabetic Eye Exam Retinopathy (A) No Retinopathy    Assessment & Plan:   Problem List Items Addressed This Visit       Endocrine   Mild nonproliferative diabetic retinopathy of both eyes (HCC)   Relevant Medications   insulin aspart (NOVOLOG FLEXPEN) 100 UNIT/ML FlexPen   Insulin Glargine (BASAGLAR KWIKPEN) 100 UNIT/ML   Diabetic peripheral neuropathy (HCC)   Relevant Medications   insulin aspart (NOVOLOG FLEXPEN) 100 UNIT/ML FlexPen   Insulin Glargine (BASAGLAR KWIKPEN) 100 UNIT/ML   Diabetic ulcer of toe of left  foot associated with type 2 diabetes mellitus, with fat layer exposed ([place])   Relevant Medications   insulin aspart (NOVOLOG FLEXPEN) 100 UNIT/ML FlexPen   Insulin Glargine (BASAGLAR KWIKPEN) 100 UNIT/ML   Diabetes mellitus (HCC) - Primary   Relevant Medications   insulin aspart (NOVOLOG FLEXPEN) 100 UNIT/ML FlexPen   Insulin Glargine (BASAGLAR KWIKPEN) 100 UNIT/ML   Other Relevant Orders   BMP8+EGFR   CBC with Differential/Platelet   Bayer DCA Hb A1c Waived   Microalbumin / creatinine urine ratio     Other   Generalized anxiety disorder   Hyperlipidemia   Major depressive disorder, recurrent episode, moderate (HCC)    A1c looks good at 6.2.  Blood pressure looks good.  No changes, seems to be doing well. Follow up plan: Return in about 3 months (around 01/28/2023), or if symptoms worsen or fail to improve, for Diabetes recheck.  Counseling provided for all of the vaccine components Orders Placed This Encounter  Procedures   BMP8+EGFR   CBC with Differential/Platelet   Bayer DCA Hb A1c Waived   Microalbumin / creatinine urine ratio    Caryl Pina, MD [facility]   10/28/2022, 11:04 AM

## 2022-10-28 NOTE — Addendum Note (Signed)
Addended by: Alphonzo Dublin on: 10/28/2022 04:43 PM   Modules accepted: Orders

## 2022-10-29 LAB — MICROALBUMIN / CREATININE URINE RATIO
Creatinine, Urine: 89.3 mg/dL
Microalb/Creat Ratio: <3 mg/g creat (ref 0–29)
Microalbumin, Urine: <3 ug/mL

## 2022-11-01 NOTE — Addendum Note (Signed)
Addended by: Alphonzo Dublin on: 11/01/2022 08:17 AM   Modules accepted: Orders

## 2022-11-06 ENCOUNTER — Encounter: Payer: Self-pay | Admitting: Psychology

## 2022-11-06 NOTE — Progress Notes (Signed)
Patient ID: Rhonda Thomas, female   DOB: [birth year], 66 y.o.   MRN: [id number] [name]   DOB: [birth year]  MR Number: [id number]  Location: [facility] [place] [facility] [place], [place] [id number] [place] [address] Dept: [phone]  Start: 4 PM End: 5 PM  Today's visit was an in person visit that lasted for 1 hour and was conducted in my outpatient clinic office with the patient myself present.  Provider/Observer:     Edgardo Roys PsyD  Chief Complaint:      Chief Complaint  Patient presents with   Pain   ADHD   Anxiety   Stress     Reason For Service:     I have worked with the patient for several years now. She was initially referred here for difficulty coping with the situation involving her children. Her youngest son is at severe difficulties over the years with regard to mood disorder, substance abuse and behavioral problems. Now there are major stressors associated with him again the patient has been essentially overwhelmed and unsure about what to do about. She reports that this has created a lot of stress both at home and at work. On top of that, her diabetes has gotten to the point that she had hammertoe amputated because of that. She has neuropathy as a result of diabetes as well and has had to stay out of her foot for 8 weeks.  The psychosocial stressors are having significant negative impact upon her severe diabetes with neuropathy and loss of toe.  The patient continues to have recurrent issues with residual effects of her type 2 diabetes with significant medical complications.  The reason for service has been reviewed and remains applicable for the current visit.  The patient reports that she is still dealing with anxiety and depressive type symptomatology and continues to take her psychotropic medications including Cymbalta, Trileptal and also  taking Lyrica for peripheral neuropathy.   Interventions Strategy:  Cognitive/behavioral therapeutic interventions and working on issues related to stress and her brittle diabetes/pain issues.  The patient has continued to struggle with significant sleep difficulties and we also worked on sleep hygiene issues.  The patient previously been diagnosed with sleep apnea and prescribed CPAP but she developed ear difficulties that were associated with the CPAP device and is no longer able to use it.  The patient recently had a consult with Dr. Rexene Alberts regarding assessment for obstructive sleep apnea and she is being scheduled for formal sleep study and assessment of whether she would qualify for the inspire implanted device for her sleep apnea.  Participation Level:   Active  Participation Quality:  Appropriate      Behavioral Observation:  Well Groomed, Alert, and Appropriate.   Current Psychosocial Factors:   The patient reports that she has recently had surgery which she had just had when I saw her last month.  She reports that she is recovering well and is now wearing a regular shoe.  The patient reports that the pain is gotten much better postsurgery over time.  The patient reports that she continues to have difficulty following dietary plans set up for her diabetes and tends to only eat 1 time per day.  Some of this is a factor of the relationship between how her husband and herself eat throughout the day.  The patient continues to have a lot of psychosocial stressors with all 3  of her children having some degree of difficulties in her life.  Content of Session:   Reviewed current symptoms and continue to work on coping skills in dealing with both her medical issues as well as the stressors.  Current Status:   The patient reports that she has been able to finish her spinal cord stimulator surgery and reports that she is experiencing a 50-60% improvement in her pain levels.     Last  Reviewed:   10/17/2022  Goals Addressed Today:    Goals addressed included building better coping skills.  Impression/Diagnosis:   The patient has a long history of attention deficit disorder but due to Maj. psychosocial stressors she also developed clinical depression and anxiety. I think that her attentional problems were valid prior to the development of these issues that existed her entire life. However, she is in and repeat if and recurrent trauma with regard primarily to her son and to her daughter to a lesser degree in years past.     Edgardo Roys, PsyD 11/06/2022

## 2022-11-11 ENCOUNTER — Other Ambulatory Visit: Payer: Self-pay | Admitting: Family Medicine

## 2022-11-11 DIAGNOSIS — E1169 Type 2 diabetes mellitus with other specified complication: Secondary | ICD-10-CM

## 2022-11-14 ENCOUNTER — Other Ambulatory Visit: Payer: Self-pay | Admitting: Neurology

## 2022-11-16 ENCOUNTER — Other Ambulatory Visit: Payer: Self-pay | Admitting: Neurology

## 2022-11-16 DIAGNOSIS — E1142 Type 2 diabetes mellitus with diabetic polyneuropathy: Secondary | ICD-10-CM

## 2022-12-21 DIAGNOSIS — Z961 Presence of intraocular lens: Secondary | ICD-10-CM | POA: Diagnosis not present

## 2023-01-12 DIAGNOSIS — Z5181 Encounter for therapeutic drug level monitoring: Secondary | ICD-10-CM | POA: Diagnosis not present

## 2023-01-12 DIAGNOSIS — M79671 Pain in right foot: Secondary | ICD-10-CM | POA: Diagnosis not present

## 2023-01-12 DIAGNOSIS — Z79899 Other long term (current) drug therapy: Secondary | ICD-10-CM | POA: Diagnosis not present

## 2023-01-12 DIAGNOSIS — G894 Chronic pain syndrome: Secondary | ICD-10-CM | POA: Diagnosis not present

## 2023-01-12 DIAGNOSIS — G2581 Restless legs syndrome: Secondary | ICD-10-CM | POA: Diagnosis not present

## 2023-01-12 DIAGNOSIS — M79672 Pain in left foot: Secondary | ICD-10-CM | POA: Diagnosis not present

## 2023-01-12 DIAGNOSIS — E1140 Type 2 diabetes mellitus with diabetic neuropathy, unspecified: Secondary | ICD-10-CM | POA: Diagnosis not present

## 2023-01-19 ENCOUNTER — Telehealth (INDEPENDENT_AMBULATORY_CARE_PROVIDER_SITE_OTHER): Payer: HMO | Admitting: Family Medicine

## 2023-01-19 ENCOUNTER — Encounter: Payer: Self-pay | Admitting: Family Medicine

## 2023-01-19 DIAGNOSIS — U071 COVID-19: Secondary | ICD-10-CM

## 2023-01-19 MED ORDER — MOLNUPIRAVIR EUA 200MG CAPSULE: 4.0000 | ORAL_CAPSULE | Freq: Two times a day (BID) | ORAL | 0 refills | Status: AC

## 2023-01-19 NOTE — Progress Notes (Signed)
Virtual Visit via telephone Note  I connected with Rhonda Thomas on 01/19/23 at 1633 by telephone and verified that I am speaking with the correct person using two identifiers. Rhonda Thomas is currently located at home and patient and husband  are currently with her during visit. The provider, Fransisca Kaufmann Tyshana Nishida, MD is located in their office at time of visit.  Call ended at 1642  I discussed the limitations, risks, security and privacy concerns of performing an evaluation and management service by telephone and the availability of in person appointments. I also discussed with the patient that there may be a patient responsible charge related to this service. The patient expressed understanding and agreed to proceed.   History and Present Illness: Patient is calling in 2 days of sore throat and congestion.  She did a home covid test and it came back positive.  She has runny nose and aches.  She denies SOB or wheezing. She was vaccinated for covid. She is using nyquil and tylenol and they have been helping.   1. COVID-19 virus infection     Outpatient Encounter Medications as of 01/19/2023  Medication Sig   molnupiravir EUA (LAGEVRIO) 200 mg CAPS capsule Take 4 capsules (800 mg total) by mouth 2 (two) times daily for 5 days.   aspirin EC 81 MG tablet Take 1 tablet by mouth daily.   BELBUCA 300 MCG FILM Take 1 Film by mouth every 12 (twelve) hours.   carbidopa-levodopa (SINEMET IR) 25-100 MG tablet Take 1 tablet by mouth in the morning and at bedtime.   Continuous Blood Gluc Receiver (FREESTYLE LIBRE 2 READER) DEVI Use w/ Libre sensor to check BS Dx E11.621   Continuous Blood Gluc Sensor (FREESTYLE LIBRE 2 SENSOR) MISC 1 each by Does not apply route every 14 (fourteen) days.   Dulaglutide (TRULICITY) 4.5 YI/0.1KP SOPN INJECT 4.5 MG into THE SKIN ONCE WEEKLY   DULoxetine (CYMBALTA) 60 MG capsule Take 1 capsule (60 mg total) by mouth at bedtime.   fluconazole (DIFLUCAN) 150 MG  tablet Take 150 mg by mouth daily.   ibuprofen (ADVIL) 800 MG tablet Take 800 mg by mouth every 8 (eight) hours as needed.   insulin aspart (NOVOLOG FLEXPEN) 100 UNIT/ML FlexPen Inject 3-20 Units into the skin 3 (three) times daily with meals.   Insulin Glargine (BASAGLAR KWIKPEN) 100 UNIT/ML Inject 30 Units into the skin daily.   Insulin Pen Needle (GLOBAL EASE INJECT PEN NEEDLES) 31G X 5 MM MISC USE FOUR TIMES DAILY   metFORMIN (GLUCOPHAGE) 500 MG tablet Take 1 tablet (500 mg total) by mouth 2 (two) times daily with a meal.   OXcarbazepine (TRILEPTAL) 150 MG tablet TAKE 1 TABLET BY MOUTH TWICE DAILY   pregabalin (LYRICA) 150 MG capsule 1 in am 2 at night   rOPINIRole (REQUIP) 1 MG tablet TAKE 1 TABLET BY MOUTH AT BEDTIME   rosuvastatin (CRESTOR) 5 MG tablet Take 1 tablet (5 mg total) by mouth at bedtime.   VITAMIN D PO Take 1,000 Units by mouth daily.   No facility-administered encounter medications on file as of 01/19/2023.    Review of Systems  Constitutional:  Positive for chills and fever.  HENT:  Positive for congestion, postnasal drip and rhinorrhea. Negative for ear discharge, ear pain, sinus pressure, sneezing and sore throat.   Eyes:  Negative for pain, redness and visual disturbance.  Respiratory:  Positive for cough. Negative for chest tightness and shortness of breath.   Cardiovascular:  Negative  for chest pain and leg swelling.  Genitourinary:  Negative for difficulty urinating and dysuria.  Musculoskeletal:  Positive for myalgias. Negative for back pain and gait problem.  Skin:  Negative for rash.  Neurological:  Negative for light-headedness and headaches.  Psychiatric/Behavioral:  Negative for agitation and behavioral problems.   All other systems reviewed and are negative.   Observations/Objective: Patient sounds comfortable in no acute distress  Assessment and Plan: Problem List Items Addressed This Visit   None Visit Diagnoses     COVID-19 virus infection    -   Primary   Relevant Medications   molnupiravir EUA (LAGEVRIO) 200 mg CAPS capsule       Sent molnupiravir  Also recommended NyQuil and Tylenol and nasal saline washes Follow up plan: Return if symptoms worsen or fail to improve.     I discussed the assessment and treatment plan with the patient. The patient was provided an opportunity to ask questions and all were answered. The patient agreed with the plan and demonstrated an understanding of the instructions.   The patient was advised to call back or seek an in-person evaluation if the symptoms worsen or if the condition fails to improve as anticipated.  The above assessment and management plan was discussed with the patient. The patient verbalized understanding of and has agreed to the management plan. Patient is aware to call the clinic if symptoms persist or worsen. Patient is aware when to return to the clinic for a follow-up visit. Patient educated on when it is appropriate to go to the emergency department.    I provided 9 minutes of non-face-to-face time during this encounter.    Worthy Rancher, MD

## 2023-01-23 ENCOUNTER — Encounter: Payer: HMO | Admitting: Psychology

## 2023-01-26 DIAGNOSIS — Z89421 Acquired absence of other right toe(s): Secondary | ICD-10-CM | POA: Diagnosis not present

## 2023-01-26 DIAGNOSIS — E1140 Type 2 diabetes mellitus with diabetic neuropathy, unspecified: Secondary | ICD-10-CM | POA: Diagnosis not present

## 2023-01-26 DIAGNOSIS — E1142 Type 2 diabetes mellitus with diabetic polyneuropathy: Secondary | ICD-10-CM | POA: Diagnosis not present

## 2023-01-30 ENCOUNTER — Ambulatory Visit (INDEPENDENT_AMBULATORY_CARE_PROVIDER_SITE_OTHER): Payer: HMO | Admitting: Family Medicine

## 2023-01-30 ENCOUNTER — Encounter: Payer: Self-pay | Admitting: Family Medicine

## 2023-01-30 VITALS — BP 123/71 | HR 85 | Ht 69.0 in | Wt 196.0 lb

## 2023-01-30 DIAGNOSIS — Z23 Encounter for immunization: Secondary | ICD-10-CM | POA: Diagnosis not present

## 2023-01-30 DIAGNOSIS — Z794 Long term (current) use of insulin: Secondary | ICD-10-CM | POA: Diagnosis not present

## 2023-01-30 DIAGNOSIS — E1149 Type 2 diabetes mellitus with other diabetic neurological complication: Secondary | ICD-10-CM | POA: Diagnosis not present

## 2023-01-30 DIAGNOSIS — E1142 Type 2 diabetes mellitus with diabetic polyneuropathy: Secondary | ICD-10-CM | POA: Diagnosis not present

## 2023-01-30 DIAGNOSIS — E113293 Type 2 diabetes mellitus with mild nonproliferative diabetic retinopathy without macular edema, bilateral: Secondary | ICD-10-CM

## 2023-01-30 DIAGNOSIS — E1169 Type 2 diabetes mellitus with other specified complication: Secondary | ICD-10-CM

## 2023-01-30 DIAGNOSIS — E785 Hyperlipidemia, unspecified: Secondary | ICD-10-CM

## 2023-01-30 LAB — CBC WITH DIFFERENTIAL/PLATELET
Basophils Absolute: 0 10*3/uL (ref 0.0–0.2)
Basos: 1 %
EOS (ABSOLUTE): 0.3 10*3/uL (ref 0.0–0.4)
Eos: 6 %
Hematocrit: 36.3 % (ref 34.0–46.6)
Hemoglobin: 12 g/dL (ref 11.1–15.9)
Immature Grans (Abs): 0 10*3/uL (ref 0.0–0.1)
Immature Granulocytes: 0 %
Lymphocytes Absolute: 1.5 10*3/uL (ref 0.7–3.1)
Lymphs: 30 %
MCH: 29.2 pg (ref 26.6–33.0)
MCHC: 33.1 g/dL (ref 31.5–35.7)
MCV: 88 fL (ref 79–97)
Monocytes Absolute: 0.4 10*3/uL (ref 0.1–0.9)
Monocytes: 7 %
Neutrophils Absolute: 2.9 10*3/uL (ref 1.4–7.0)
Neutrophils: 56 %
Platelets: 214 10*3/uL (ref 150–450)
RBC: 4.11 x10E6/uL (ref 3.77–5.28)
RDW: 12.7 % (ref 11.7–15.4)
WBC: 5.2 10*3/uL (ref 3.4–10.8)

## 2023-01-30 LAB — CMP14+EGFR
ALT: 18 IU/L (ref 0–32)
AST: 13 IU/L (ref 0–40)
Albumin/Globulin Ratio: 1.8 (ref 1.2–2.2)
Albumin: 3.7 g/dL — ABNORMAL LOW (ref 3.9–4.9)
Alkaline Phosphatase: 77 IU/L (ref 44–121)
BUN/Creatinine Ratio: 19 (ref 12–28)
BUN: 15 mg/dL (ref 8–27)
Bilirubin Total: 0.4 mg/dL (ref 0.0–1.2)
CO2: 24 mmol/L (ref 20–29)
Calcium: 8.6 mg/dL — ABNORMAL LOW (ref 8.7–10.3)
Chloride: 103 mmol/L (ref 96–106)
Creatinine, Ser: 0.77 mg/dL (ref 0.57–1.00)
Globulin, Total: 2.1 g/dL (ref 1.5–4.5)
Glucose: 269 mg/dL — ABNORMAL HIGH (ref 70–99)
Potassium: 4.4 mmol/L (ref 3.5–5.2)
Sodium: 140 mmol/L (ref 134–144)
Total Protein: 5.8 g/dL — ABNORMAL LOW (ref 6.0–8.5)
eGFR: 85 mL/min/{1.73_m2} (ref >59)

## 2023-01-30 LAB — LIPID PANEL
Chol/HDL Ratio: 1.9 ratio (ref 0.0–4.4)
Cholesterol, Total: 102 mg/dL (ref 100–199)
HDL: 54 mg/dL (ref >39)
LDL Chol Calc (NIH): 28 mg/dL (ref 0–99)
Triglycerides: 108 mg/dL (ref 0–149)
VLDL Cholesterol Cal: 20 mg/dL (ref 5–40)

## 2023-01-30 LAB — BAYER DCA HB A1C WAIVED: HB A1C (BAYER DCA - WAIVED): 7.3 % — ABNORMAL HIGH (ref 4.8–5.6)

## 2023-01-30 MED ORDER — TRULICITY 4.5 MG/0.5ML ~~LOC~~ SOAJ: 4.5000 mg | SUBCUTANEOUS | 3 refills | Status: DC

## 2023-01-30 NOTE — Progress Notes (Signed)
BP 123/71   Pulse 85   Ht 5' 9"$  (1.753 m)   Wt 196 lb (88.9 kg)   SpO2 96%   BMI 28.94 kg/m    Subjective:   Patient ID: Rhonda Thomas, female    DOB: [birth year], 67 y.o.   MRN: [id number]  HPI: Rhonda Thomas is a 67 y.o. female presenting on 01/30/2023 for Medical Management of Chronic Issues, Diabetes, and restless leg (Wants Emireth Cockerham to manage medications)   HPI Type 2 diabetes mellitus Patient comes in today for recheck of his diabetes. Patient has been currently taking Trulicity and NovoLog and based and metformin. Patient is not currently on an ACE inhibitor/ARB. Patient has not seen an ophthalmologist this year. Patient denies any new issues with their feet. The symptom started onset as an adult hyperlipidemia and neuropathy ARE RELATED TO DM   Hyperlipidemia Patient is coming in for recheck of his hyperlipidemia. The patient is currently taking Crestor. They deny any issues with myalgias or history of liver damage from it. They deny any focal numbness or weakness or chest pain.   Relevant past medical, surgical, family and social history reviewed and updated as indicated. Interim medical history since our last visit reviewed. Allergies and medications reviewed and updated.  Review of Systems  Constitutional:  Negative for chills and fever.  Eyes:  Negative for visual disturbance.  Respiratory:  Negative for chest tightness and shortness of breath.   Cardiovascular:  Negative for chest pain and leg swelling.  Musculoskeletal:  Negative for back pain and gait problem.  Skin:  Negative for rash.  Neurological:  Negative for dizziness, light-headedness and headaches.  Psychiatric/Behavioral:  Negative for agitation, behavioral problems, self-injury, sleep disturbance and suicidal ideas. The patient is not nervous/anxious.   All other systems reviewed and are negative.   Per HPI unless specifically indicated above   Allergies as of 01/30/2023        Reactions   Canagliflozin Other (See Comments)   vaginitis vaginitis   Pioglitazone Other (See Comments)   Edema Edema        Medication List        Accurate as of January 30, 2023 11:07 AM. If you have any questions, ask your nurse or doctor.          aspirin EC 81 MG tablet Take 1 tablet by mouth daily.   Basaglar KwikPen 100 UNIT/ML Inject 30 Units into the skin daily.   Belbuca 300 MCG Film Generic drug: Buprenorphine HCl Take 1 Film by mouth every 12 (twelve) hours.   carbidopa-levodopa 25-100 MG tablet Commonly known as: SINEMET IR Take 1 tablet by mouth in the morning and at bedtime.   DULoxetine 60 MG capsule Commonly known as: CYMBALTA Take 1 capsule (60 mg total) by mouth at bedtime.   fluconazole 150 MG tablet Commonly known as: DIFLUCAN Take 150 mg by mouth daily.   FreeStyle Vanderbilt 2 Reader Kerrin Mo Use w/ Libre sensor to [employer] Dx E11.621   FreeStyle Libre 2 Sensor Misc 1 each by Does not apply route every 14 (fourteen) days.   ibuprofen 800 MG tablet Commonly known as: ADVIL Take 800 mg by mouth every 8 (eight) hours as needed.   Insulin Pen Needle 31G X 5 MM Misc Commonly known as: Global Ease Inject Pen Needles USE FOUR TIMES DAILY   metFORMIN 500 MG tablet Commonly known as: GLUCOPHAGE Take 1 tablet (500 mg total) by mouth 2 (two) times daily with a meal.  NovoLOG FlexPen 100 UNIT/ML FlexPen Generic drug: insulin aspart Inject 3-20 Units into the skin 3 (three) times daily with meals.   OXcarbazepine 150 MG tablet Commonly known as: TRILEPTAL TAKE 1 TABLET BY MOUTH TWICE DAILY   pregabalin 150 MG capsule Commonly known as: LYRICA 1 in am 2 at night   rOPINIRole 1 MG tablet Commonly known as: REQUIP TAKE 1 TABLET BY MOUTH AT BEDTIME   rosuvastatin 5 MG tablet Commonly known as: CRESTOR Take 1 tablet (5 mg total) by mouth at bedtime.   Trulicity 4.5 0000000 Sopn Generic drug: Dulaglutide Inject 4.5 mg into the skin  once a week. What changed: See the new instructions. Changed by: Fransisca Kaufmann Frankye Schwegel, MD   VITAMIN D PO Take 1,000 Units by mouth daily.         Objective:   BP 123/71   Pulse 85   Ht 5' 9"$  (1.753 m)   Wt 196 lb (88.9 kg)   SpO2 96%   BMI 28.94 kg/m   Wt Readings from Last 3 Encounters:  01/30/23 196 lb (88.9 kg)  10/28/22 195 lb (88.5 kg)  07/29/22 195 lb (88.5 kg)    Physical Exam Vitals and nursing note reviewed.  Constitutional:      General: She is not in acute distress.    Appearance: She is well-developed. She is not diaphoretic.  Eyes:     Conjunctiva/sclera: Conjunctivae normal.  Cardiovascular:     Rate and Rhythm: Normal rate and regular rhythm.     Heart sounds: Normal heart sounds. No murmur heard. Pulmonary:     Effort: Pulmonary effort is normal. No respiratory distress.     Breath sounds: Normal breath sounds. No wheezing.  Musculoskeletal:        General: No swelling or tenderness. Normal range of motion.  Skin:    General: Skin is warm and dry.     Findings: No rash.  Neurological:     Mental Status: She is alert and oriented to person, place, and time.     Coordination: Coordination normal.  Psychiatric:        Behavior: Behavior normal.       Assessment & Plan:   Problem List Items Addressed This Visit       Endocrine   Mild nonproliferative diabetic retinopathy of both eyes (HCC)   Relevant Medications   Dulaglutide (TRULICITY) 4.5 0000000 SOPN   Diabetic peripheral neuropathy (HCC)   Relevant Medications   Dulaglutide (TRULICITY) 4.5 0000000 SOPN   Diabetes mellitus (HCC) - Primary   Relevant Medications   Dulaglutide (TRULICITY) 4.5 0000000 SOPN   Other Relevant Orders   CBC with Differential/Platelet   CMP14+EGFR   Lipid panel   Bayer DCA Hb A1c Waived     Other   Hyperlipidemia   Relevant Orders   CBC with Differential/Platelet   CMP14+EGFR   Lipid panel   Bayer DCA Hb A1c Waived    A1c looks okay at 7.3.   Discussed increasing better control on mealtime insulin and diet.  No other changes in medicine. Follow up plan: Return in about 3 months (around 04/30/2023), or if symptoms worsen or fail to improve, for Diabetes recheck.  Counseling provided for all of the vaccine components Orders Placed This Encounter  Procedures   CBC with Differential/Platelet   CMP14+EGFR   Lipid panel   Bayer DCA Hb A1c Waived    Caryl Pina, MD [facility] 01/30/2023, 11:07 AM

## 2023-02-23 ENCOUNTER — Encounter: Payer: HMO | Attending: Psychology | Admitting: Psychology

## 2023-02-23 DIAGNOSIS — F5101 Primary insomnia: Secondary | ICD-10-CM | POA: Diagnosis not present

## 2023-02-23 DIAGNOSIS — E1142 Type 2 diabetes mellitus with diabetic polyneuropathy: Secondary | ICD-10-CM | POA: Insufficient documentation

## 2023-02-23 DIAGNOSIS — F411 Generalized anxiety disorder: Secondary | ICD-10-CM | POA: Diagnosis not present

## 2023-02-23 DIAGNOSIS — F908 Attention-deficit hyperactivity disorder, other type: Secondary | ICD-10-CM | POA: Insufficient documentation

## 2023-02-28 ENCOUNTER — Encounter: Payer: Self-pay | Admitting: Psychology

## 2023-02-28 NOTE — Progress Notes (Signed)
Patient ID: Rhonda Thomas, female   DOB: Aug 19, 1956, 67 y.o.   MRN: [id number] [name]   DOB: 1956/08/18  MR Number: [id number]  Location: [facility] [place], [place] [id number] [place] [address] Dept: [phone]  Start: 9 AM End: 10 AM  Today's visit was an in person visit that lasted for 1 hour and was conducted in my outpatient clinic office with the patient myself present.  Provider/Observer:     Edgardo Roys PsyD  Chief Complaint:      Chief Complaint  Patient presents with   Pain   ADHD   Anxiety   Stress     Reason For Service:     I have worked with the patient for several years now. She was initially referred here for difficulty coping with the situation involving her children. Her youngest son is at severe difficulties over the years with regard to mood disorder, substance abuse and behavioral problems. Now there are major stressors associated with him again the patient has been essentially overwhelmed and unsure about what to do about. She reports that this has created a lot of stress both at home and at work. On top of that, her diabetes has gotten to the point that she had hammertoe amputated because of that. She has neuropathy as a result of diabetes as well and has had to stay out of her foot for 8 weeks.  The psychosocial stressors are having significant negative impact upon her severe diabetes with neuropathy and loss of toe.  The patient continues to have recurrent issues with residual effects of her type 2 diabetes with significant medical complications.  The reason for service has been reviewed and remains applicable for the current visit.  The patient reports that she is still dealing with anxiety and depressive type symptomatology and continues to take her psychotropic medications including Cymbalta, Trileptal and also  taking Lyrica for peripheral neuropathy.   Interventions Strategy:  Cognitive/behavioral therapeutic interventions and working on issues related to stress and her brittle diabetes/pain issues.  The patient has continued to struggle with significant sleep difficulties and we also worked on sleep hygiene issues.  The patient previously been diagnosed with sleep apnea and prescribed CPAP but she developed ear difficulties that were associated with the CPAP device and is no longer able to use it.  The patient recently had a consult with Dr. Rexene Alberts regarding assessment for obstructive sleep apnea and she is being scheduled for formal sleep study and assessment of whether she would qualify for the inspire implanted device for her sleep apnea.  Participation Level:   Active  Participation Quality:  Appropriate      Behavioral Observation:  Well Groomed, Alert, and Appropriate.   Current Psychosocial Factors:   The patient reports that she has recently had surgery which she had just had when I saw her last month.  She reports that she is recovering well and is now wearing a regular shoe.  The patient reports that the pain is gotten much better postsurgery over time.  The patient reports that she continues to have difficulty following dietary plans set up for her diabetes and tends to only eat 1 time per day.  Some of this is a factor of the relationship between how her husband and herself eat throughout the day.  The patient continues to have a lot of psychosocial stressors with all 3  of her children having some degree of difficulties in her life.  Content of Session:   Reviewed current symptoms and continue to work on coping skills in dealing with both her medical issues as well as the stressors.  Current Status:   The patient reports that she has been able to finish her spinal cord stimulator surgery and reports that she is experiencing a 50-60% improvement in her pain levels.     Last  Reviewed:   02/23/2023  Goals Addressed Today:    Goals addressed included building better coping skills.  Impression/Diagnosis:   The patient has a long history of attention deficit disorder but due to Maj. psychosocial stressors she also developed clinical depression and anxiety. I think that her attentional problems were valid prior to the development of these issues that existed her entire life. However, she is in and repeat if and recurrent trauma with regard primarily to her son and to her daughter to a lesser degree in years past.     Edgardo Roys, PsyD 02/28/2023

## 2023-03-09 DIAGNOSIS — E1140 Type 2 diabetes mellitus with diabetic neuropathy, unspecified: Secondary | ICD-10-CM | POA: Diagnosis not present

## 2023-03-09 DIAGNOSIS — G2581 Restless legs syndrome: Secondary | ICD-10-CM | POA: Diagnosis not present

## 2023-03-09 DIAGNOSIS — G894 Chronic pain syndrome: Secondary | ICD-10-CM | POA: Diagnosis not present

## 2023-03-09 DIAGNOSIS — M79672 Pain in left foot: Secondary | ICD-10-CM | POA: Diagnosis not present

## 2023-03-09 DIAGNOSIS — M79671 Pain in right foot: Secondary | ICD-10-CM | POA: Diagnosis not present

## 2023-03-29 ENCOUNTER — Encounter: Payer: Self-pay | Admitting: Family Medicine

## 2023-04-03 ENCOUNTER — Telehealth: Payer: Self-pay

## 2023-04-03 NOTE — Telephone Encounter (Signed)
Rhonda Thomas (Key: [id number]) PA Case ID #: [id number] Rx #: [id number] Need Help? Call us at [phone] Status sent iconSent to Plan today Drug Basaglar KwikPen 100UNIT/ML pen-injectors ePA cloud Optician, dispensing PA Form [phone] NCPDP) Original Claim Info MR,70 +MUST USE LANTUS OR MED NEC PA [id number] NON-FORMULARY DRUG, CONTACT PRESCRIBER

## 2023-04-03 NOTE — Telephone Encounter (Signed)
Rhonda Thomas (Key: [id number]) Rx #: [id number] Need Help? Call us at [phone] Status sent iconSent to Plan today Drug Basaglar KwikPen 100UNIT/ML pen-injectors ePA cloud Optician, dispensing PA Form [phone] NCPDP) Original Claim Info MR,70 +MUST USE LANTUS OR MED NEC PA [id number] NON-FORMULARY DRUG, CONTACT PRESCRIBER

## 2023-04-04 NOTE — Telephone Encounter (Signed)
Approved from 04/03/23-04/02/2024.  Received fax from [facility].

## 2023-04-25 ENCOUNTER — Telehealth: Payer: Self-pay | Admitting: Family Medicine

## 2023-04-25 NOTE — Patient Instructions (Signed)
Our records indicate that you are due for your screening mammogram.  Please stop at check out to schedule an appointment with the mobile unit that comes to our location. If you are unable to schedule please call out office and we will be happy to make the appointment for you.  

## 2023-04-25 NOTE — Telephone Encounter (Signed)
Contacted Allayne Gitelman to schedule their annual wellness visit. Appointment made for 10/02/2023.  Thank you,  Judeth Cornfield,  AMB Clinical Support [facility] AWV Program Direct Dial ??[phone]

## 2023-04-28 ENCOUNTER — Telehealth: Payer: Self-pay | Admitting: Family Medicine

## 2023-04-28 DIAGNOSIS — E1149 Type 2 diabetes mellitus with other diabetic neurological complication: Secondary | ICD-10-CM

## 2023-04-28 MED ORDER — TRULICITY 3 MG/0.5ML ~~LOC~~ SOAJ
3.0000 mg | SUBCUTANEOUS | 0 refills | Status: DC
Start: 2023-04-28 — End: 2023-08-04

## 2023-04-28 NOTE — Telephone Encounter (Signed)
Pt called requesting samples of Trulicity or wanted to know if a smaller amount of medicine could be sent in to the pharmacy for her. Says pharmacy doesn't have her Rx in stock.   Can nurse check on samples and if we dont have any, can covering provider advise on this?

## 2023-04-28 NOTE — Telephone Encounter (Signed)
Have her check and see what dose they have in stock and I can send in a lower mg.

## 2023-04-28 NOTE — Telephone Encounter (Signed)
They have 30mL/0.5mg  in stock

## 2023-04-28 NOTE — Telephone Encounter (Signed)
Patient aware.

## 2023-04-28 NOTE — Telephone Encounter (Signed)
We do not have trulicity samples.

## 2023-05-04 ENCOUNTER — Encounter: Payer: Self-pay | Admitting: Family Medicine

## 2023-05-04 ENCOUNTER — Ambulatory Visit (INDEPENDENT_AMBULATORY_CARE_PROVIDER_SITE_OTHER): Payer: HMO | Admitting: Family Medicine

## 2023-05-04 VITALS — BP 107/67 | HR 81 | Ht 69.0 in | Wt 195.0 lb

## 2023-05-04 DIAGNOSIS — E1049 Type 1 diabetes mellitus with other diabetic neurological complication: Secondary | ICD-10-CM

## 2023-05-04 DIAGNOSIS — Z794 Long term (current) use of insulin: Secondary | ICD-10-CM

## 2023-05-04 DIAGNOSIS — E785 Hyperlipidemia, unspecified: Secondary | ICD-10-CM | POA: Diagnosis not present

## 2023-05-04 DIAGNOSIS — E1149 Type 2 diabetes mellitus with other diabetic neurological complication: Secondary | ICD-10-CM

## 2023-05-04 DIAGNOSIS — E1142 Type 2 diabetes mellitus with diabetic polyneuropathy: Secondary | ICD-10-CM | POA: Diagnosis not present

## 2023-05-04 DIAGNOSIS — E1169 Type 2 diabetes mellitus with other specified complication: Secondary | ICD-10-CM

## 2023-05-04 DIAGNOSIS — E113293 Type 2 diabetes mellitus with mild nonproliferative diabetic retinopathy without macular edema, bilateral: Secondary | ICD-10-CM

## 2023-05-04 DIAGNOSIS — L97521 Non-pressure chronic ulcer of other part of left foot limited to breakdown of skin: Secondary | ICD-10-CM

## 2023-05-04 LAB — BAYER DCA HB A1C WAIVED: HB A1C (BAYER DCA - WAIVED): 6.6 % — ABNORMAL HIGH (ref 4.8–5.6)

## 2023-05-04 MED ORDER — DULOXETINE HCL 60 MG PO CPEP: 60.0000 mg | ORAL_CAPSULE | Freq: Every day | ORAL | 3 refills | Status: DC

## 2023-05-04 MED ORDER — METFORMIN HCL 500 MG PO TABS
500.0000 mg | ORAL_TABLET | Freq: Two times a day (BID) | ORAL | 3 refills | Status: DC
Start: 2023-05-04 — End: 2023-08-16

## 2023-05-04 MED ORDER — ROSUVASTATIN CALCIUM 5 MG PO TABS: 5.0000 mg | ORAL_TABLET | Freq: Every day | ORAL | 3 refills | Status: DC

## 2023-05-04 NOTE — Progress Notes (Signed)
Ht 5\' 9"  (1.753 m)   BMI 28.94 kg/m    Subjective:   Patient ID: Rhonda Thomas, female    DOB: Mar 10, 1956, 67 y.o.   MRN: [id number]  HPI: Rhonda Thomas is a 67 y.o. female presenting on 05/04/2023 for Medical Management of Chronic Issues and Diabetes   HPI Type 2 diabetes mellitus Patient comes in today for recheck of his diabetes. Patient has been currently taking Trulicity 4.5, although she says she did run out of it for a couple weeks because of stock issues and Basaglar 30 and NovoLog as needed and metformin. Patient is not currently on an ACE inhibitor/ARB. Patient has seen an ophthalmologist this year. Patient denies any new issues with their feet. The symptom started onset as an adult retinopathy and neuropathy and hyperlipidemia ARE RELATED TO DM   Hyperlipidemia Patient is coming in for recheck of his hyperlipidemia. The patient is currently taking Crestor. They deny any issues with myalgias or history of liver damage from it. They deny any focal numbness or weakness or chest pain.   Depression and neuropathy recheck Patient is coming in for depression and neuropathy recheck.  She also sees neurology for this but has been off of some of the medicines.  She still takes the duloxetine from [name]  Relevant past medical, surgical, family and social history reviewed and updated as indicated. Interim medical history since our last visit reviewed. Allergies and medications reviewed and updated.  Review of Systems  Constitutional:  Negative for chills and fever.  HENT:  Negative for congestion, ear discharge and ear pain.   Eyes:  Negative for redness and visual disturbance.  Respiratory:  Negative for chest tightness and shortness of breath.   Cardiovascular:  Negative for chest pain and leg swelling.  Genitourinary:  Negative for difficulty urinating and dysuria.  Musculoskeletal:  Negative for back pain and gait problem.  Skin:  Negative for rash.  Neurological:   Positive for numbness. Negative for light-headedness and headaches.  Psychiatric/Behavioral:  Negative for agitation and behavioral problems.   All other systems reviewed and are negative.   Per HPI unless specifically indicated above   Allergies as of 05/04/2023       Reactions   Canagliflozin Other (See Comments)   vaginitis vaginitis   Pioglitazone Other (See Comments)   Edema Edema        Medication List        Accurate as of May 04, 2023 11:32 AM. If you have any questions, ask your nurse or doctor.          aspirin EC 81 MG tablet Take 1 tablet by mouth daily.   Basaglar KwikPen 100 UNIT/ML Inject 30 Units into the skin daily.   Belbuca 300 MCG Film Generic drug: Buprenorphine HCl Take 1 Film by mouth every 12 (twelve) hours.   carbidopa-levodopa 25-100 MG tablet Commonly known as: SINEMET IR Take 1 tablet by mouth in the morning and at bedtime.   DULoxetine 60 MG capsule Commonly known as: CYMBALTA Take 1 capsule (60 mg total) by mouth at bedtime.   fluconazole 150 MG tablet Commonly known as: DIFLUCAN Take 150 mg by mouth daily.   FreeStyle Freedom 2 Reader Hardie Pulley Use w/ Libre sensor to [employer] Dx E11.621   FreeStyle Libre 2 Sensor Misc 1 each by Does not apply route every 14 (fourteen) days.   ibuprofen 800 MG tablet Commonly known as: ADVIL Take 800 mg by mouth every 8 (eight) hours as  needed.   Insulin Pen Needle 31G X 5 MM Misc Commonly known as: Global Ease Inject Pen Needles USE FOUR TIMES DAILY   metFORMIN 500 MG tablet Commonly known as: GLUCOPHAGE Take 1 tablet (500 mg total) by mouth 2 (two) times daily with a meal.   NovoLOG FlexPen 100 UNIT/ML FlexPen Generic drug: insulin aspart Inject 3-20 Units into the skin 3 (three) times daily with meals.   OXcarbazepine 150 MG tablet Commonly known as: TRILEPTAL TAKE 1 TABLET BY MOUTH TWICE DAILY   pregabalin 150 MG capsule Commonly known as: LYRICA 1 in am 2 at night    rOPINIRole 1 MG tablet Commonly known as: REQUIP TAKE 1 TABLET BY MOUTH AT BEDTIME   rosuvastatin 5 MG tablet Commonly known as: CRESTOR Take 1 tablet (5 mg total) by mouth at bedtime.   Trulicity 4.5 MG/0.5ML Sopn Generic drug: Dulaglutide Inject 4.5 mg into the skin once a week.   Trulicity 3 MG/0.5ML Sopn Generic drug: Dulaglutide Inject 3 mg as directed once a week. Bridge therapy until 4.5mg  becomes available   VITAMIN D PO Take 1,000 Units by mouth daily.         Objective:   Ht 5\' 9"  (1.753 m)   BMI 28.94 kg/m   Wt Readings from Last 3 Encounters:  01/30/23 196 lb (88.9 kg)  10/28/22 195 lb (88.5 kg)  07/29/22 195 lb (88.5 kg)    Physical Exam Vitals and nursing note reviewed.  Constitutional:      General: She is not in acute distress.    Appearance: She is well-developed. She is not diaphoretic.  Eyes:     Conjunctiva/sclera: Conjunctivae normal.  Cardiovascular:     Rate and Rhythm: Normal rate and regular rhythm.     Heart sounds: Normal heart sounds. No murmur heard. Pulmonary:     Effort: Pulmonary effort is normal. No respiratory distress.     Breath sounds: Normal breath sounds. No wheezing.  Musculoskeletal:        General: No swelling or tenderness. Normal range of motion.  Skin:    General: Skin is warm and dry.     Findings: No rash.  Neurological:     Mental Status: She is alert and oriented to person, place, and time.     Coordination: Coordination normal.  Psychiatric:        Behavior: Behavior normal.       Assessment & Plan:   Problem List Items Addressed This Visit       Endocrine   Mild nonproliferative diabetic retinopathy of both eyes (HCC)   Relevant Medications   metFORMIN (GLUCOPHAGE) 500 MG tablet   rosuvastatin (CRESTOR) 5 MG tablet   Diabetic peripheral neuropathy (HCC)   Relevant Medications   DULoxetine (CYMBALTA) 60 MG capsule   metFORMIN (GLUCOPHAGE) 500 MG tablet   rosuvastatin (CRESTOR) 5 MG tablet    Diabetes mellitus (HCC) - Primary   Relevant Medications   metFORMIN (GLUCOPHAGE) 500 MG tablet   rosuvastatin (CRESTOR) 5 MG tablet   Other Relevant Orders   Bayer DCA Hb A1c Waived     Other   Hyperlipidemia   Relevant Medications   rosuvastatin (CRESTOR) 5 MG tablet   Other Visit Diagnoses     Skin ulcer of left foot, limited to breakdown of skin (HCC)       Relevant Medications   DULoxetine (CYMBALTA) 60 MG capsule       Blood pressure and heart rate everything looks good there, A1c is  6.6, looks great  Patient says she has had a few hypoglycemic episodes so we will have her reduce her Basaglar to 28 units daily Instead of 30 Follow up plan: Return in about 3 months (around 08/04/2023), or if symptoms worsen or fail to improve, for Diabetes recheck.  Counseling provided for all of the vaccine components Orders Placed This Encounter  Procedures   Bayer DCA Hb A1c Waived    Arville Care, MD [facility] 05/04/2023, 11:32 AM

## 2023-05-05 DIAGNOSIS — G894 Chronic pain syndrome: Secondary | ICD-10-CM | POA: Diagnosis not present

## 2023-05-05 DIAGNOSIS — M79671 Pain in right foot: Secondary | ICD-10-CM | POA: Diagnosis not present

## 2023-05-05 DIAGNOSIS — G2581 Restless legs syndrome: Secondary | ICD-10-CM | POA: Diagnosis not present

## 2023-05-05 DIAGNOSIS — M79672 Pain in left foot: Secondary | ICD-10-CM | POA: Diagnosis not present

## 2023-05-05 DIAGNOSIS — E1140 Type 2 diabetes mellitus with diabetic neuropathy, unspecified: Secondary | ICD-10-CM | POA: Diagnosis not present

## 2023-05-09 ENCOUNTER — Encounter (INDEPENDENT_AMBULATORY_CARE_PROVIDER_SITE_OTHER): Payer: HMO | Admitting: Ophthalmology

## 2023-05-18 DIAGNOSIS — H35372 Puckering of macula, left eye: Secondary | ICD-10-CM | POA: Diagnosis not present

## 2023-05-18 DIAGNOSIS — H2513 Age-related nuclear cataract, bilateral: Secondary | ICD-10-CM | POA: Diagnosis not present

## 2023-05-18 DIAGNOSIS — E113293 Type 2 diabetes mellitus with mild nonproliferative diabetic retinopathy without macular edema, bilateral: Secondary | ICD-10-CM | POA: Diagnosis not present

## 2023-05-18 LAB — HM DIABETES EYE EXAM

## 2023-06-29 ENCOUNTER — Telehealth: Payer: Self-pay

## 2023-06-29 ENCOUNTER — Encounter (HOSPITAL_COMMUNITY): Payer: Self-pay | Admitting: *Deleted

## 2023-06-29 ENCOUNTER — Emergency Department (HOSPITAL_COMMUNITY)
Admission: EM | Admit: 2023-06-29 | Discharge: 2023-06-29 | Disposition: A | Discharge status: Home / Self Care | Payer: HMO | Attending: Emergency Medicine | Admitting: Emergency Medicine

## 2023-06-29 ENCOUNTER — Emergency Department (HOSPITAL_COMMUNITY): Payer: HMO

## 2023-06-29 ENCOUNTER — Other Ambulatory Visit: Payer: Self-pay

## 2023-06-29 DIAGNOSIS — M79662 Pain in left lower leg: Secondary | ICD-10-CM | POA: Insufficient documentation

## 2023-06-29 DIAGNOSIS — W010XXA Fall on same level from slipping, tripping and stumbling without subsequent striking against object, initial encounter: Secondary | ICD-10-CM | POA: Diagnosis not present

## 2023-06-29 DIAGNOSIS — Z794 Long term (current) use of insulin: Secondary | ICD-10-CM | POA: Diagnosis not present

## 2023-06-29 DIAGNOSIS — M79605 Pain in left leg: Secondary | ICD-10-CM | POA: Diagnosis not present

## 2023-06-29 DIAGNOSIS — Z7982 Long term (current) use of aspirin: Secondary | ICD-10-CM | POA: Diagnosis not present

## 2023-06-29 DIAGNOSIS — E119 Type 2 diabetes mellitus without complications: Secondary | ICD-10-CM | POA: Insufficient documentation

## 2023-06-29 DIAGNOSIS — M19072 Primary osteoarthritis, left ankle and foot: Secondary | ICD-10-CM | POA: Diagnosis not present

## 2023-06-29 DIAGNOSIS — M7732 Calcaneal spur, left foot: Secondary | ICD-10-CM | POA: Diagnosis not present

## 2023-06-29 DIAGNOSIS — W19XXXA Unspecified fall, initial encounter: Secondary | ICD-10-CM

## 2023-06-29 DIAGNOSIS — Z7984 Long term (current) use of oral hypoglycemic drugs: Secondary | ICD-10-CM | POA: Diagnosis not present

## 2023-06-29 MED ORDER — TRIPLE ANTIBIOTIC 3.5-400-5000 EX OINT
TOPICAL_OINTMENT | Freq: Once | CUTANEOUS | Status: AC
  Filled 2023-06-29: qty 1

## 2023-06-29 MED ORDER — OXYCODONE-ACETAMINOPHEN 5-325 MG PO TABS
1.0000 | ORAL_TABLET | Freq: Once | ORAL | Status: AC
  Administered 2023-06-29: 1 via ORAL
  Filled 2023-06-29: qty 1

## 2023-06-29 NOTE — Telephone Encounter (Signed)
Patient called c/o pain in leg - states that she hit leg and scraped it - the wound is red with cloudy drainage. She states that she has redness in the leg and some heat - c/o of extreme pain even after taking pain medication. I advised the patient to seek immediate care at the ED or [facility]center. Patient expressed understanding and states that she will go.

## 2023-06-29 NOTE — ED Provider Notes (Signed)
[place] [facility] AT [facility] Provider Note   CSN: [id number] Arrival date & time: 06/29/23  1056     History  Chief Complaint  Patient presents with   Rhonda Thomas is a 67 y.o. female history of diabetes, neuropathy presents today for evaluation of leg pain.  Patient tripped and fell in her bedroom 2 days ago.  States her left leg hit the corner of a wooden board.  She has skin abrasion to the middle of the left lower leg.  States she has been taking pain medication with no relief.  She takes Belbuca for chronic pain.  She denies hitting her head, LOC, nausea, vomiting, vision changes, headache, lightheadedness.   Fall      Past Medical History:  Diagnosis Date   Anxiety    Attention deficit disorder (ADD)    Depression    Diabetes (HCC)    Hiatal hernia    Movement disorder    Neuropathy    Schatzki's ring    Sleep apnea    Past Surgical History:  Procedure Laterality Date   ABDOMINAL HYSTERECTOMY     BREAST LUMPECTOMY WITH RADIOACTIVE SEED LOCALIZATION Right 04/25/2016   Procedure: RIGHT BREAST LUMPECTOMY WITH RADIOACTIVE SEED LOCALIZATION;  Surgeon: Chevis Pretty III, MD;  Location: [facility];  Service: General;  Laterality: Right;   CATARACT EXTRACTION, BILATERAL Bilateral    CHOLECYSTECTOMY     RECTOCELE REPAIR     SPINAL CORD STIMULATOR IMPLANT     TOE AMPUTATION Right    2nd toe rt foot   TUBAL LIGATION       Home Medications Prior to Admission medications   Medication Sig Start Date End Date Taking? Authorizing Provider  aspirin EC 81 MG tablet Take 1 tablet by mouth daily. 10/10/19   [provider]  BELBUCA 300 MCG FILM Take 1 Film by mouth every 12 (twelve) hours. 07/27/22   [provider]  carbidopa-levodopa (SINEMET IR) 25-100 MG tablet Take 1 tablet by mouth in the morning and at bedtime.    [provider]  Continuous Blood Gluc Receiver (FREESTYLE LIBRE 2 READER)  DEVI Use w/ Libre sensor to check BS Dx E11.621 07/29/22   Dettinger, Elige Radon, MD  Continuous Blood Gluc Sensor (FREESTYLE LIBRE 2 SENSOR) MISC 1 each by Does not apply route every 14 (fourteen) days. 07/29/22   Dettinger, Elige Radon, MD  Dulaglutide (TRULICITY) 3 MG/0.5ML SOPN Inject 3 mg as directed once a week. Bridge therapy until 4.5mg  becomes available 04/28/23   Raliegh Ip, DO  Dulaglutide (TRULICITY) 4.5 MG/0.5ML SOPN Inject 4.5 mg into the skin once a week. 01/30/23   Dettinger, Elige Radon, MD  DULoxetine (CYMBALTA) 60 MG capsule Take 1 capsule (60 mg total) by mouth at bedtime. 05/04/23   Dettinger, Elige Radon, MD  fluconazole (DIFLUCAN) 150 MG tablet Take 150 mg by mouth daily.    [provider]  ibuprofen (ADVIL) 800 MG tablet Take 800 mg by mouth every 8 (eight) hours as needed. 03/22/22   [provider]  insulin aspart (NOVOLOG FLEXPEN) 100 UNIT/ML FlexPen Inject 3-20 Units into the skin 3 (three) times daily with meals. 10/28/22   Dettinger, Elige Radon, MD  Insulin Glargine (BASAGLAR KWIKPEN) 100 UNIT/ML Inject 30 Units into the skin daily. 10/28/22   Dettinger, Elige Radon, MD  Insulin Pen Needle (GLOBAL EASE INJECT PEN NEEDLES) 31G X 5 MM MISC USE FOUR TIMES DAILY 11/30/18  Remus Loffler, PA-C  metFORMIN (GLUCOPHAGE) 500 MG tablet Take 1 tablet (500 mg total) by mouth 2 (two) times daily with a meal. 05/04/23   Dettinger, Elige Radon, MD  OXcarbazepine (TRILEPTAL) 150 MG tablet TAKE 1 TABLET BY MOUTH TWICE DAILY 10/18/22   Glean Salvo, NP  pregabalin (LYRICA) 150 MG capsule 1 in am 2 at night 04/13/21   Levert Feinstein, MD  rOPINIRole (REQUIP) 1 MG tablet TAKE 1 TABLET BY MOUTH AT BEDTIME 02/28/22   Levert Feinstein, MD  rosuvastatin (CRESTOR) 5 MG tablet Take 1 tablet (5 mg total) by mouth at bedtime. 05/04/23   Dettinger, Elige Radon, MD  VITAMIN D PO Take 1,000 Units by mouth daily.    [provider]      Allergies    Canagliflozin and Pioglitazone    Review of Systems    Review of Systems Negative except as per HPI.  Physical Exam Updated Vital Signs BP 128/87   Pulse 77   Temp 98 F (36.7 C) (Oral)   Resp 20   Ht 5\' 9"  (1.753 m)   Wt 88.5 kg   SpO2 98%   BMI 28.80 kg/m  Physical Exam Vitals and nursing note reviewed.  Constitutional:      Appearance: Normal appearance.  HENT:     Head: Normocephalic and atraumatic.     Mouth/Throat:     Mouth: Mucous membranes are moist.  Eyes:     General: No scleral icterus. Cardiovascular:     Rate and Rhythm: Normal rate and regular rhythm.     Pulses: Normal pulses.     Heart sounds: Normal heart sounds.  Pulmonary:     Effort: Pulmonary effort is normal.     Breath sounds: Normal breath sounds.  Abdominal:     General: Abdomen is flat.     Palpations: Abdomen is soft.     Tenderness: There is no abdominal tenderness.  Musculoskeletal:        General: No deformity.  Skin:    General: Skin is warm.     Findings: No rash.     Comments: Well healing wound on left lower leg.  Neurological:     General: No focal deficit present.     Mental Status: She is alert.  Psychiatric:        Mood and Affect: Mood normal.     ED Results / Procedures / Treatments   Labs (all labs ordered are listed, but only abnormal results are displayed) Labs Reviewed - No data to display  EKG None  Radiology DG Tibia/Fibula Left  Result Date: 06/29/2023 CLINICAL DATA:  Left leg pain after a fall EXAM: LEFT TIBIA AND FIBULA - 2 VIEW COMPARISON:  None Available. FINDINGS: There is no acute fracture or dislocation. Bony alignment is normal. There is degenerative change about the ankle. There is Achilles enthesopathy with calcification in the Achilles tendon as well as inferior calcaneal spurring. There is no soft tissue gas or radiopaque foreign body. IMPRESSION: No acute finding in the tibia/fibula. Electronically Signed   By: Lesia Hausen M.D.   On: 06/29/2023 12:27    Procedures Procedures    Medications  Ordered in ED Medications  oxyCODONE-acetaminophen (PERCOCET/ROXICET) 5-325 MG per tablet 1 tablet (1 tablet Oral Given 06/29/23 1221)  neomycin-bacitracin-polymyxin [phone] OINT ( Apply externally Given 06/29/23 1239)    ED Course/ Medical Decision Making/ A&P  Medical Decision Making  This patient presents to the ED for leg pain, this involves an extensive number of treatment options, and is a complaint that carries with a high risk of complications and morbidity.  The differential diagnosis includes fracture, dislocation, cellulitis, DVT.  This is not an exhaustive list.  Imaging studies: I ordered imaging studies, personally reviewed, interpreted imaging and agree with the radiologist's interpretations. The results include: X-ray left tib-fib was negative.  Problem list/ ED course/ Critical interventions/ Medical management: HPI: See above Vital signs within normal range and stable throughout visit. Laboratory/imaging studies significant for: See above. On physical examination, patient is afebrile and appears in no acute distress.  There is a well-healing wound on the left lower leg.  X-ray left tib-fib was negative for any osseous abnormalities.  Patient is able to ambulate and put weight on her left leg.  I have low suspicion for fracture, dislocation or ligamentous injury.  Unlikely DVT, patient has no calf swelling, no recent travel or surgery, no personal or family history of DVT/PE. Likely muscular pain. Advised patient to take Tylenol/ibuprofen/naproxen for pain, follow-up with primary care physician for further evaluation and management, return to the ER if new or worsening symptoms. I have reviewed the patient home medicines and have made adjustments as needed.  Cardiac monitoring/EKG: The patient was maintained on a cardiac monitor.  I personally reviewed and interpreted the cardiac monitor which showed an underlying rhythm of: sinus  rhythm.  Additional history obtained: External records from outside source obtained and reviewed including: Chart review including previous notes, labs, imaging.  Consultations obtained:  Disposition Continued outpatient therapy. Follow-up with PCP recommended for reevaluation of symptoms. Treatment plan discussed with patient.  Pt acknowledged understanding was agreeable to the plan. Worrisome signs and symptoms were discussed with patient, and patient acknowledged understanding to return to the ED if they noticed these signs and symptoms. Patient was stable upon discharge.   This chart was dictated using voice recognition software.  Despite best efforts to proofread,  errors can occur which can change the documentation meaning.          Final Clinical Impression(s) / ED Diagnoses Final diagnoses:  Fall, initial encounter  Pain of left lower leg    Rx / DC Orders ED Discharge Orders     None         Jeanelle Malling, [place] 06/29/23 1242    Bethann Berkshire, MD 07/02/23 1656

## 2023-06-29 NOTE — ED Triage Notes (Signed)
Pt states she fell two days ago after tripping over something in the floor.  Pt with wound to left lower leg from the fall. Denies hitting her head with the fall, denies taking any blood thinners.

## 2023-06-29 NOTE — Discharge Instructions (Addendum)
Your x-ray did not show any evidence of fracture or dislocation.  Please take tylenol/ibuprofen for pain. I recommend close follow-up with PCP for reevaluation.  Please do not hesitate to return to emergency department if worrisome signs symptoms we discussed become apparent.

## 2023-07-06 ENCOUNTER — Telehealth: Payer: Self-pay

## 2023-07-06 NOTE — Telephone Encounter (Signed)
Transition Care Management Unsuccessful Follow-up Telephone Call  Date of discharge and from where:  06/29/2023 [facility]  Attempts:  1st Attempt  Reason for unsuccessful TCM follow-up call:  Left voice message  [facility]  [facility] Population Health Community Resource Care Guide   [email or web address]  ?? [phone]   Website: [email or web address]  [email or web address]

## 2023-07-06 NOTE — Telephone Encounter (Signed)
Transition Care Management Unsuccessful Follow-up Telephone Call  Date of discharge and from where:  06/29/2023 [facility]  Attempts:  2nd Attempt  Reason for unsuccessful TCM follow-up call:  Left voice message  Kaiea Esselman [facility]  [facility] Population Health Community Resource Care Guide   [email or web address]  ?? [phone]   Website: [email or web address]

## 2023-07-20 IMAGING — MG MM DIGITAL SCREENING BILAT W/ TOMO AND CAD
8 series · 8 of 24 positions shown · non-contrast
Comparison: Previous exam(s).

CLINICAL DATA: Screening.

EXAM:
DIGITAL SCREENING BILATERAL MAMMOGRAM WITH TOMOSYNTHESIS AND CAD
TECHNIQUE: Bilateral screening digital craniocaudal and mediolateral oblique
mammograms were obtained. Bilateral screening digital breast
tomosynthesis was performed. The images were evaluated with
computer-aided detection.

[L MLO synth-2D]
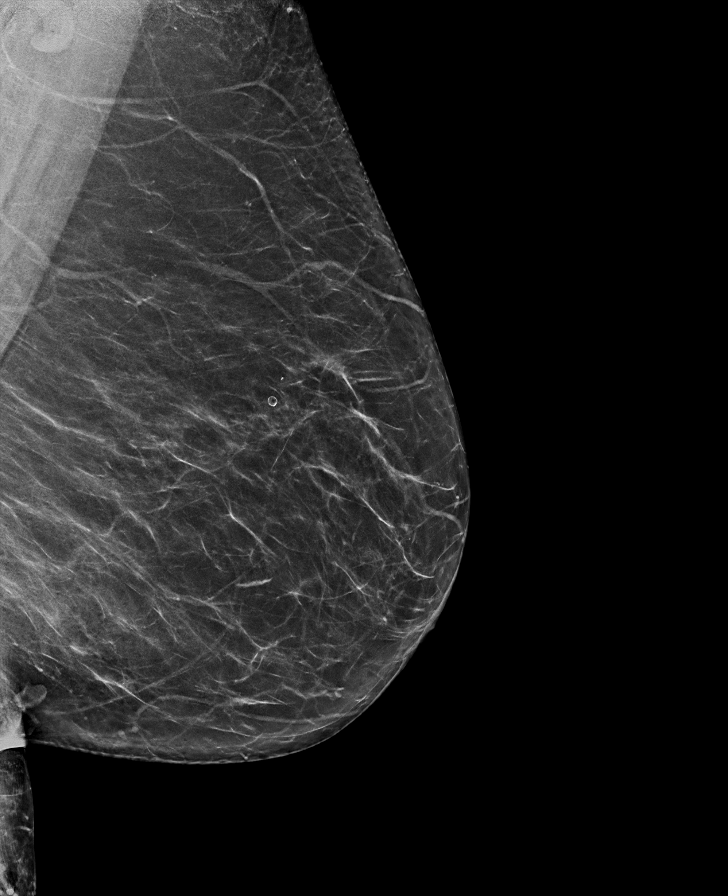

[L CC synth-2D]
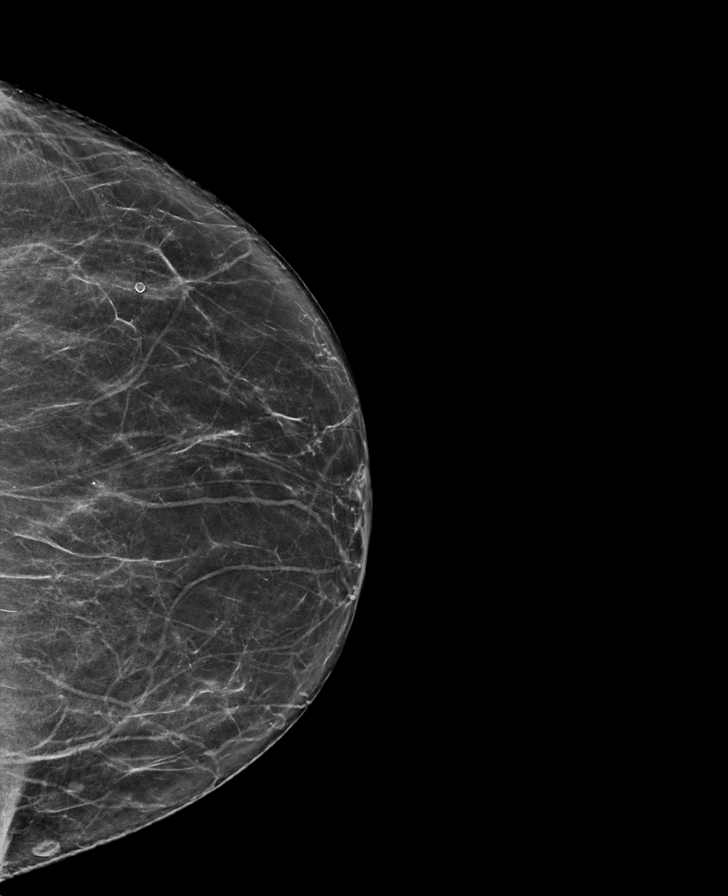

[R CC synth-2D]
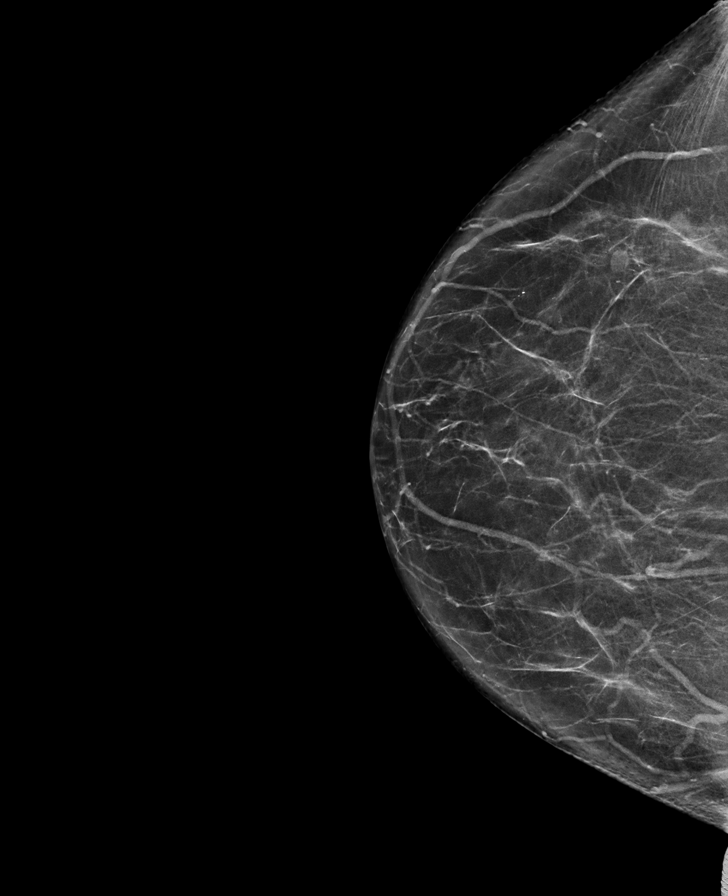

[R MLO synth-2D]
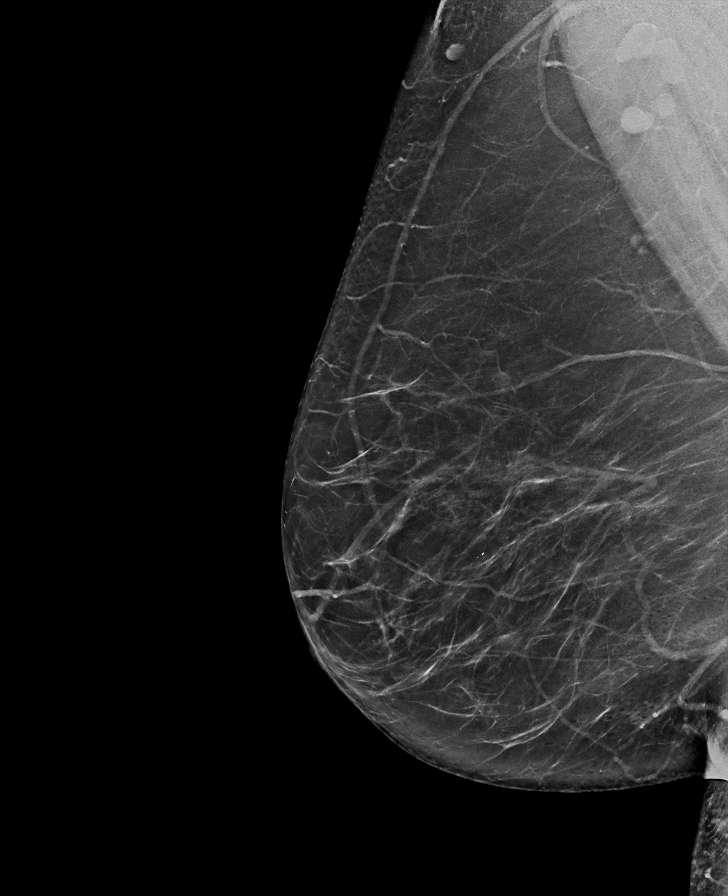

[L CC tomo · tomo slice 35/69.0]
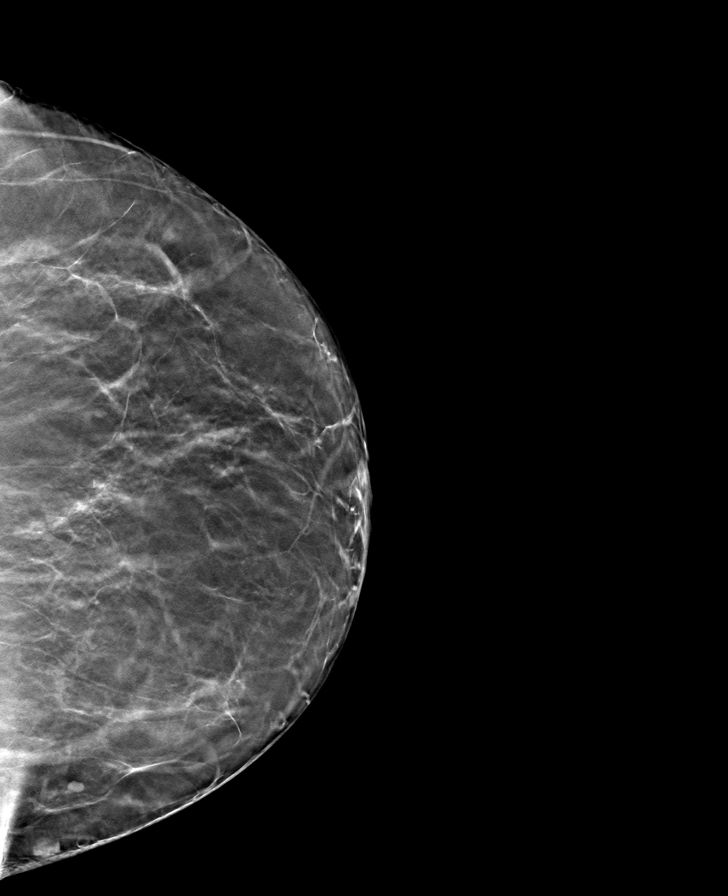

[L MLO tomo · tomo slice 41/80.0]
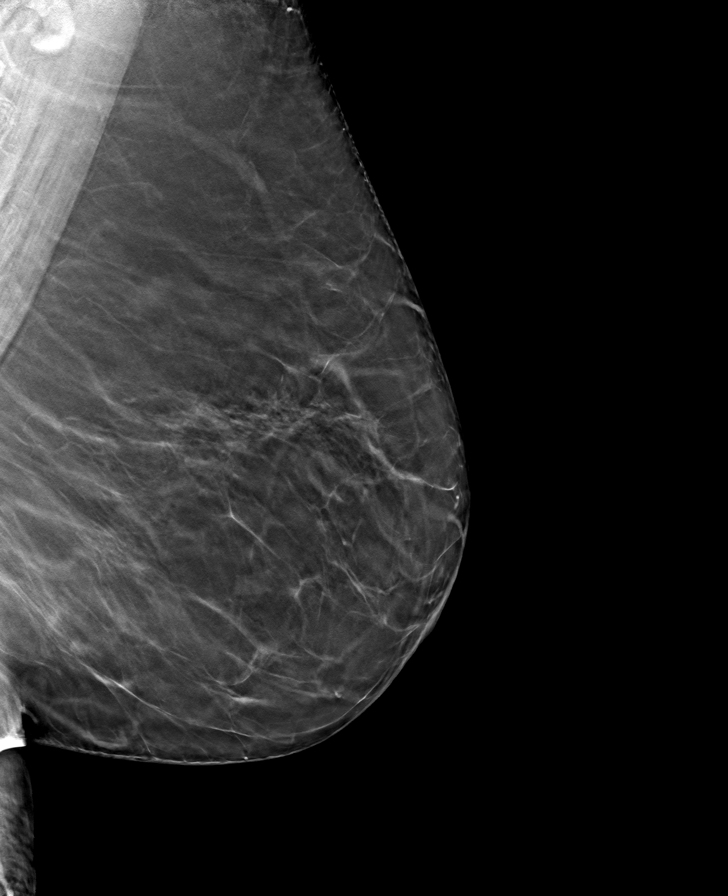

[R MLO tomo · tomo slice 39/78.0]
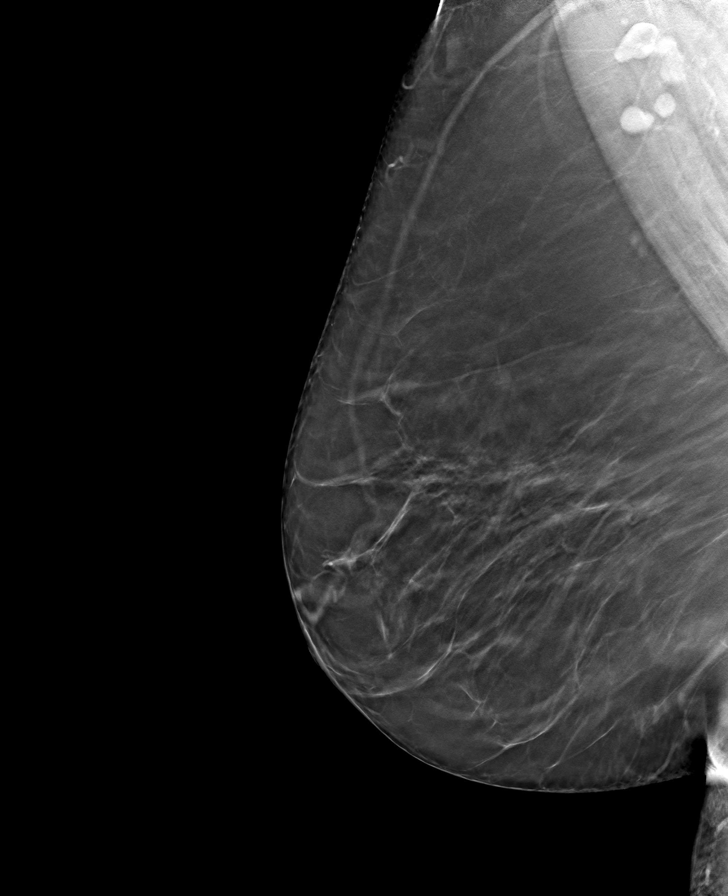

[R CC tomo · tomo slice 36/71.0]
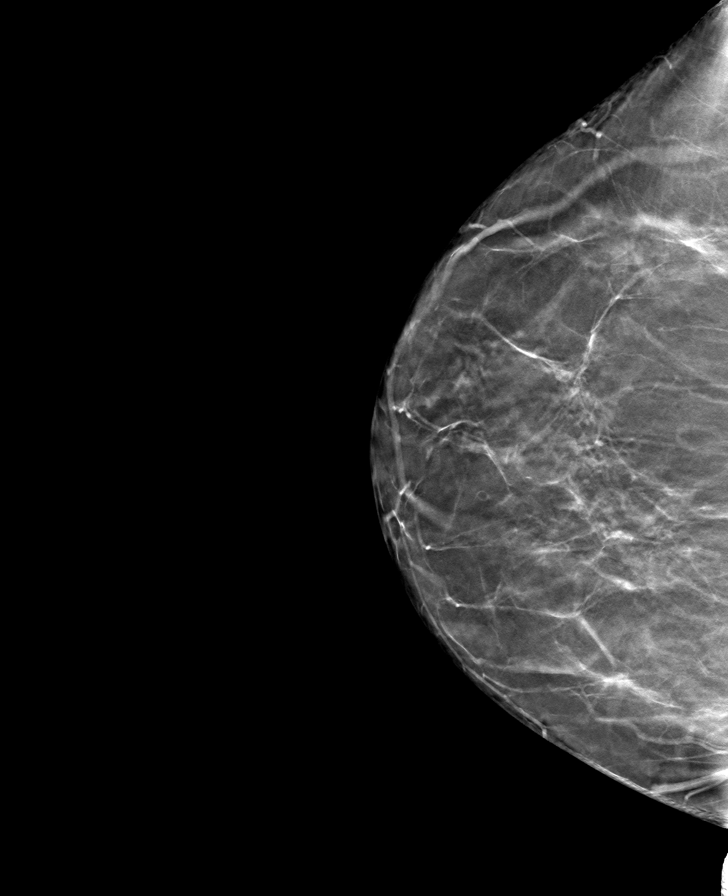

[8 of 24 positions shown; findings below may reference images not displayed]

ACR Breast Density Category b: There are scattered areas of
fibroglandular density.
FINDINGS: There are no findings suspicious for malignancy.
IMPRESSION: No mammographic evidence of malignancy. A result letter of this
screening mammogram will be mailed directly to the patient.

RECOMMENDATION:
Screening mammogram in one year. (Code:51-O-LD2)

BI-RADS CATEGORY  1: Negative.

## 2023-08-03 DIAGNOSIS — E1140 Type 2 diabetes mellitus with diabetic neuropathy, unspecified: Secondary | ICD-10-CM | POA: Diagnosis not present

## 2023-08-03 DIAGNOSIS — G2581 Restless legs syndrome: Secondary | ICD-10-CM | POA: Diagnosis not present

## 2023-08-03 DIAGNOSIS — Z5181 Encounter for therapeutic drug level monitoring: Secondary | ICD-10-CM | POA: Diagnosis not present

## 2023-08-03 DIAGNOSIS — M79672 Pain in left foot: Secondary | ICD-10-CM | POA: Diagnosis not present

## 2023-08-03 DIAGNOSIS — Z79899 Other long term (current) drug therapy: Secondary | ICD-10-CM | POA: Diagnosis not present

## 2023-08-03 DIAGNOSIS — G894 Chronic pain syndrome: Secondary | ICD-10-CM | POA: Diagnosis not present

## 2023-08-03 DIAGNOSIS — M79671 Pain in right foot: Secondary | ICD-10-CM | POA: Diagnosis not present

## 2023-08-04 ENCOUNTER — Ambulatory Visit (INDEPENDENT_AMBULATORY_CARE_PROVIDER_SITE_OTHER): Payer: HMO | Admitting: Family Medicine

## 2023-08-04 ENCOUNTER — Encounter: Payer: Self-pay | Admitting: Family Medicine

## 2023-08-04 VITALS — BP 116/67 | HR 81 | Ht 69.0 in | Wt 192.0 lb

## 2023-08-04 DIAGNOSIS — Z794 Long term (current) use of insulin: Secondary | ICD-10-CM

## 2023-08-04 DIAGNOSIS — E785 Hyperlipidemia, unspecified: Secondary | ICD-10-CM | POA: Diagnosis not present

## 2023-08-04 DIAGNOSIS — E1142 Type 2 diabetes mellitus with diabetic polyneuropathy: Secondary | ICD-10-CM | POA: Diagnosis not present

## 2023-08-04 DIAGNOSIS — E1149 Type 2 diabetes mellitus with other diabetic neurological complication: Secondary | ICD-10-CM

## 2023-08-04 DIAGNOSIS — E113293 Type 2 diabetes mellitus with mild nonproliferative diabetic retinopathy without macular edema, bilateral: Secondary | ICD-10-CM | POA: Diagnosis not present

## 2023-08-04 DIAGNOSIS — E1169 Type 2 diabetes mellitus with other specified complication: Secondary | ICD-10-CM | POA: Diagnosis not present

## 2023-08-04 DIAGNOSIS — Z23 Encounter for immunization: Secondary | ICD-10-CM | POA: Diagnosis not present

## 2023-08-04 LAB — BAYER DCA HB A1C WAIVED: HB A1C (BAYER DCA - WAIVED): 6 % — ABNORMAL HIGH (ref 4.8–5.6)

## 2023-08-04 MED ORDER — SEMAGLUTIDE (2 MG/DOSE) 8 MG/3ML ~~LOC~~ SOPN
2.0000 mg | PEN_INJECTOR | SUBCUTANEOUS | 3 refills | Status: DC
Start: 2023-08-04 — End: 2023-11-23

## 2023-08-04 MED ORDER — FREESTYLE LIBRE 3 READER DEVI
1.0000 | 1 refills | Status: DC
Start: 2023-08-04 — End: 2024-09-23

## 2023-08-04 MED ORDER — FREESTYLE LIBRE 3 SENSOR MISC
1.0000 | 3 refills | Status: DC
Start: 2023-08-04 — End: 2023-08-16

## 2023-08-04 NOTE — Patient Instructions (Signed)
A1c 6.0, good job no changes

## 2023-08-04 NOTE — Progress Notes (Signed)
BP 116/67   Pulse 81   Ht 5\' 9"  (1.753 m)   Wt 192 lb (87.1 kg)   SpO2 97%   BMI 28.35 kg/m    Subjective:   Patient ID: Rhonda Thomas, female    DOB: 11-02-1956, 67 y.o.   MRN: [id number]  HPI: Rhonda Thomas is a 67 y.o. female presenting on 08/04/2023 for Medical Management of Chronic Issues, Diabetes, and Anxiety   HPI Type 2 diabetes mellitus Patient comes in today for recheck of his diabetes. Patient has been currently taking Trulicity and Recruitment consultant. Patient is not currently on an ACE inhibitor/ARB. Patient has seen an ophthalmologist this year. Patient has small cut on the end of one of her toes on the right foot that does not appear infected and she is keeping a close eye in for an antibiotic cream on it.  She just got it 2 days ago.  The symptom started onset as an adult hyperlipidemia and diabetic retinopathy and diabetic neuropathy and history of diabetic toe amputation ARE RELATED TO DM   Hyperlipidemia Patient is coming in for recheck of his hyperlipidemia. The patient is currently taking Crestor. They deny any issues with myalgias or history of liver damage from it. They deny any focal numbness or weakness or chest pain.   Patient has small wound on third toe on the tip of it on the right foot.  She is putting antibiotic cream on it.  Relevant past medical, surgical, family and social history reviewed and updated as indicated. Interim medical history since our last visit reviewed. Allergies and medications reviewed and updated.  Review of Systems  Constitutional:  Negative for chills and fever.  HENT:  Negative for congestion, ear discharge and ear pain.   Eyes:  Negative for redness and visual disturbance.  Respiratory:  Negative for chest tightness and shortness of breath.   Cardiovascular:  Negative for chest pain and leg swelling.  Genitourinary:  Negative for difficulty urinating and dysuria.  Musculoskeletal:  Negative for back pain and gait  problem.  Skin:  Positive for wound. Negative for color change and rash.  Neurological:  Negative for light-headedness and headaches.  Psychiatric/Behavioral:  Negative for agitation and behavioral problems.   All other systems reviewed and are negative.   Per HPI unless specifically indicated above   Allergies as of 08/04/2023       Reactions   Canagliflozin Other (See Comments)   vaginitis vaginitis   Pioglitazone Other (See Comments)   Edema Edema        Medication List        Accurate as of August 04, 2023  2:20 PM. If you have any questions, ask your nurse or doctor.          STOP taking these medications    FreeStyle Libre 2 Reader Hardie Pulley Stopped by: Elige Radon Juventino Pavone   FreeStyle Libre 2 Sensor Misc Stopped by: Elige Radon Trejan Buda       TAKE these medications    aspirin EC 81 MG tablet Take 1 tablet by mouth daily.   Basaglar KwikPen 100 UNIT/ML Inject 30 Units into the skin daily.   Belbuca 300 MCG Film Generic drug: Buprenorphine HCl Take 1 Film by mouth every 12 (twelve) hours.   carbidopa-levodopa 25-100 MG tablet Commonly known as: SINEMET IR Take 1 tablet by mouth in the morning and at bedtime.   DULoxetine 60 MG capsule Commonly known as: CYMBALTA Take 1 capsule (60 mg total) by mouth  at bedtime.   fluconazole 150 MG tablet Commonly known as: DIFLUCAN Take 150 mg by mouth daily.   ibuprofen 800 MG tablet Commonly known as: ADVIL Take 800 mg by mouth every 8 (eight) hours as needed.   Insulin Pen Needle 31G X 5 MM Misc Commonly known as: Global Ease Inject Pen Needles USE FOUR TIMES DAILY   metFORMIN 500 MG tablet Commonly known as: GLUCOPHAGE Take 1 tablet (500 mg total) by mouth 2 (two) times daily with a meal.   NovoLOG FlexPen 100 UNIT/ML FlexPen Generic drug: insulin aspart Inject 3-20 Units into the skin 3 (three) times daily with meals.   OXcarbazepine 150 MG tablet Commonly known as: TRILEPTAL TAKE 1 TABLET BY  MOUTH TWICE DAILY   pregabalin 150 MG capsule Commonly known as: LYRICA 1 in am 2 at night   rOPINIRole 1 MG tablet Commonly known as: REQUIP TAKE 1 TABLET BY MOUTH AT BEDTIME   rosuvastatin 5 MG tablet Commonly known as: CRESTOR Take 1 tablet (5 mg total) by mouth at bedtime.   Trulicity 4.5 MG/0.5ML Sopn Generic drug: Dulaglutide Inject 4.5 mg into the skin once a week.   Trulicity 3 MG/0.5ML Sopn Generic drug: Dulaglutide Inject 3 mg as directed once a week. Bridge therapy until 4.5mg  becomes available   VITAMIN D PO Take 1,000 Units by mouth daily.         Objective:   BP 116/67   Pulse 81   Ht 5\' 9"  (1.753 m)   Wt 192 lb (87.1 kg)   SpO2 97%   BMI 28.35 kg/m   Wt Readings from Last 3 Encounters:  08/04/23 192 lb (87.1 kg)  06/29/23 195 lb (88.5 kg)  05/04/23 195 lb (88.5 kg)    Physical Exam Vitals and nursing note reviewed.  Constitutional:      General: She is not in acute distress.    Appearance: She is well-developed. She is not diaphoretic.  Eyes:     Conjunctiva/sclera: Conjunctivae normal.  Cardiovascular:     Rate and Rhythm: Normal rate and regular rhythm.     Heart sounds: Normal heart sounds. No murmur heard. Pulmonary:     Effort: Pulmonary effort is normal. No respiratory distress.     Breath sounds: Normal breath sounds. No wheezing.  Musculoskeletal:        General: No tenderness. Normal range of motion.  Skin:    General: Skin is warm and dry.     Findings: No rash.  Neurological:     Mental Status: She is alert and oriented to person, place, and time.     Coordination: Coordination normal.  Psychiatric:        Behavior: Behavior normal.     Results for orders placed or performed in visit on 05/18/23  HM DIABETES EYE EXAM  Result Value Ref Range   HM Diabetic Eye Exam Retinopathy (A) No Retinopathy   Diabetic Foot Exam - Simple   Simple Foot Form Diabetic Foot exam was performed with the following findings: Yes  08/04/2023  2:22 PM  Visual Inspection See comments: Yes Sensation Testing See comments: Yes Pulse Check Posterior Tibialis and Dorsalis pulse intact bilaterally: Yes Comments No sensation to fine touch in her feet on either side anywhere. Superficial scratch on the tip of her right third toe, no erythema or purulence or signs of infection.  Appears to be healing.      Assessment & Plan:   Problem List Items Addressed This Visit  Endocrine   Mild nonproliferative diabetic retinopathy of both eyes (HCC)   Diabetic peripheral neuropathy (HCC)   Diabetes mellitus (HCC) - Primary   Relevant Orders   CBC with Differential/Platelet   CMP14+EGFR   Lipid panel   Bayer DCA Hb A1c Waived     Other   Hyperlipidemia   Relevant Orders   CBC with Differential/Platelet   CMP14+EGFR   Lipid panel   Bayer DCA Hb A1c Waived   Other Visit Diagnoses     Need for pneumococcal 20-valent conjugate vaccination       Relevant Orders   Pneumococcal conjugate vaccine 20-valent (Prevnar 20) (Completed)      This office visit was a visit to discuss patient's diabetic management and because she is out of control and using insulin 4 times daily and having to check her blood sugars 4 times daily I believe she would be a good candidate for a continuous subcutaneous glucose monitor such as freestyle libre.  Will switch to Ozempic because Trulicity is harder to find on the market right now  A1c was 6.0 which is good. Follow up plan: Return in about 3 months (around 11/04/2023) for Diabetes recheck.  Counseling provided for all of the vaccine components Orders Placed This Encounter  Procedures   Pneumococcal conjugate vaccine 20-valent (Prevnar 20)   CBC with Differential/Platelet   CMP14+EGFR   Lipid panel   Bayer DCA Hb A1c Waived    Arville Care, MD [facility] 08/04/2023, 2:20 PM

## 2023-08-05 LAB — CMP14+EGFR
ALT: 15 IU/L (ref 0–32)
AST: 11 IU/L (ref 0–40)
Albumin: 3.8 g/dL — ABNORMAL LOW (ref 3.9–4.9)
Alkaline Phosphatase: 95 IU/L (ref 44–121)
BUN/Creatinine Ratio: 17 (ref 12–28)
BUN: 13 mg/dL (ref 8–27)
Bilirubin Total: 0.3 mg/dL (ref 0.0–1.2)
CO2: 26 mmol/L (ref 20–29)
Calcium: 8.6 mg/dL — ABNORMAL LOW (ref 8.7–10.3)
Chloride: 103 mmol/L (ref 96–106)
Creatinine, Ser: 0.75 mg/dL (ref 0.57–1.00)
Globulin, Total: 2.5 g/dL (ref 1.5–4.5)
Glucose: 227 mg/dL — ABNORMAL HIGH (ref 70–99)
Potassium: 4.7 mmol/L (ref 3.5–5.2)
Sodium: 140 mmol/L (ref 134–144)
Total Protein: 6.3 g/dL (ref 6.0–8.5)
eGFR: 88 mL/min/{1.73_m2} (ref >59)

## 2023-08-05 LAB — CBC WITH DIFFERENTIAL/PLATELET
Basophils Absolute: 0 10*3/uL (ref 0.0–0.2)
Basos: 1 %
EOS (ABSOLUTE): 0.3 10*3/uL (ref 0.0–0.4)
Eos: 5 %
Hematocrit: 39.2 % (ref 34.0–46.6)
Hemoglobin: 12.7 g/dL (ref 11.1–15.9)
Immature Grans (Abs): 0 10*3/uL (ref 0.0–0.1)
Immature Granulocytes: 0 %
Lymphocytes Absolute: 1.4 10*3/uL (ref 0.7–3.1)
Lymphs: 29 %
MCH: 29.2 pg (ref 26.6–33.0)
MCHC: 32.4 g/dL (ref 31.5–35.7)
MCV: 90 fL (ref 79–97)
Monocytes Absolute: 0.3 10*3/uL (ref 0.1–0.9)
Monocytes: 6 %
Neutrophils Absolute: 2.9 10*3/uL (ref 1.4–7.0)
Neutrophils: 59 %
Platelets: 208 10*3/uL (ref 150–450)
RBC: 4.35 x10E6/uL (ref 3.77–5.28)
RDW: 12.7 % (ref 11.7–15.4)
WBC: 4.9 10*3/uL (ref 3.4–10.8)

## 2023-08-05 LAB — LIPID PANEL
Chol/HDL Ratio: 3.7 ratio (ref 0.0–4.4)
Cholesterol, Total: 176 mg/dL (ref 100–199)
HDL: 48 mg/dL (ref >39)
LDL Chol Calc (NIH): 79 mg/dL (ref 0–99)
Triglycerides: 301 mg/dL — ABNORMAL HIGH (ref 0–149)
VLDL Cholesterol Cal: 49 mg/dL — ABNORMAL HIGH (ref 5–40)

## 2023-08-16 ENCOUNTER — Other Ambulatory Visit: Payer: Self-pay | Admitting: Family Medicine

## 2023-08-16 DIAGNOSIS — E1049 Type 1 diabetes mellitus with other diabetic neurological complication: Secondary | ICD-10-CM

## 2023-08-16 DIAGNOSIS — L97521 Non-pressure chronic ulcer of other part of left foot limited to breakdown of skin: Secondary | ICD-10-CM

## 2023-08-16 DIAGNOSIS — Z794 Long term (current) use of insulin: Secondary | ICD-10-CM

## 2023-08-28 ENCOUNTER — Other Ambulatory Visit: Payer: Self-pay | Admitting: Neurology

## 2023-08-28 DIAGNOSIS — E1142 Type 2 diabetes mellitus with diabetic polyneuropathy: Secondary | ICD-10-CM

## 2023-08-31 MED ORDER — OXCARBAZEPINE 150 MG PO TABS: ORAL_TABLET | ORAL | 0 refills | Status: DC

## 2023-09-04 ENCOUNTER — Other Ambulatory Visit (HOSPITAL_COMMUNITY): Payer: Self-pay

## 2023-09-06 ENCOUNTER — Other Ambulatory Visit: Payer: Self-pay | Admitting: Pharmacist

## 2023-09-06 ENCOUNTER — Other Ambulatory Visit (HOSPITAL_COMMUNITY): Payer: Self-pay

## 2023-09-06 NOTE — Progress Notes (Signed)
Pharmacy Quality Measure Review  This patient is appearing on a report for being at risk of failing the adherence measure for cholesterol (statin) medications this calendar year.   Medication: rosuvastatin 5 mg daily  Last fill date: 8/9 for 30 day supply  Contacted patient. She notes she has been having a hard time finding a pharmacy that will be able to fill her GLP1, so has been transferring between different pharmacies. Current plan is to use [facility].   Currently prescribed Ozempic, but believes her insurance is preferring Trulicity. She also believes she is in the Coverage Gap at this time, but has both Medicare and a Commercial plan so cannot pursue patient assistance. She can utilize copay cards with her [employer].   Will collaborate with clinic staff and PCP to send chronic medications to [facility], and review whether Ozempic or Trulicity is covered by her commercial insurance plan where she could use a copay card to bring cost down.   Catie Eppie Gibson, PharmD, BCACP, CPP Clinical Pharmacist [facility] [phone]

## 2023-09-19 ENCOUNTER — Telehealth: Payer: Self-pay | Admitting: Pharmacist

## 2023-09-19 ENCOUNTER — Telehealth: Payer: Self-pay

## 2023-09-19 DIAGNOSIS — E1149 Type 2 diabetes mellitus with other diabetic neurological complication: Secondary | ICD-10-CM

## 2023-09-19 NOTE — Telephone Encounter (Signed)
REF 2016 placed-T2DM Refer to [facility] Pharm[facility] note Patient having difficulty finding GLP1 ozempic vs trulicity Assess finances

## 2023-09-19 NOTE — Progress Notes (Signed)
   Care Guide Note  09/19/2023 Name: Mella Inclan MRN: [id number] DOB: [birth year]  Referred by: Dettinger, Elige Radon, MD Reason for referral : Care Coordination (Outreach to schedule with Pharm d )   Eastyn Skalla is a 67 y.o. year old female who is a primary care patient of Dettinger, Elige Radon, MD. Allayne Gitelman was referred to the pharmacist for assistance related to DM.    An unsuccessful telephone outreach was attempted today to contact the patient who was referred to the pharmacy team for assistance with medication management. Additional attempts will be made to contact the patient.   Penne Lash, RMA Care Guide [facility]  [place], [place] [address] Direct Dial: [phone] [email or web address]

## 2023-09-22 NOTE — Progress Notes (Signed)
   Care Guide Note  09/22/2023 Name: Rhonda Thomas MRN: [id number] DOB: [birth year]  Referred by: Dettinger, Elige Radon, MD Reason for referral : Care Coordination (Outreach to schedule with Pharm d )   Rhonda Thomas is a 67 y.o. year old female who is a primary care patient of Dettinger, Elige Radon, MD. Rhonda Thomas was referred to the pharmacist for assistance related to DM.    A second unsuccessful telephone outreach was attempted today to contact the patient who was referred to the pharmacy team for assistance with medication management. Additional attempts will be made to contact the patient.  Penne Lash, RMA Care Guide [facility]  [place], [place] [address] Direct Dial: [phone] [email or web address]

## 2023-09-26 NOTE — Progress Notes (Signed)
   Care Guide Note  09/26/2023 Name: Rhonda Thomas MRN: [id number] DOB: Dec 23, 1955  Referred by: Dettinger, Elige Radon, MD Reason for referral : Care Coordination (Outreach to schedule with Pharm d )   Rhonda Thomas is a 67 y.o. year old female who is a primary care patient of Dettinger, Elige Radon, MD. Rhonda Thomas was referred to the pharmacist for assistance related to DM.    A third unsuccessful telephone outreach was attempted today to contact the patient who was referred to the pharmacy team for assistance with medication assistance. The Population [facility] team is pleased to engage with this patient at any time in the future upon receipt of referral and should he/she be interested in assistance from the [facility] team.   Penne Lash, RMA Care Guide [facility]  [place], [place] [address] Direct Dial: [phone] [email or web address]

## 2023-10-09 ENCOUNTER — Encounter: Payer: PPO | Attending: Psychology | Admitting: Psychology

## 2023-10-09 DIAGNOSIS — E1142 Type 2 diabetes mellitus with diabetic polyneuropathy: Secondary | ICD-10-CM | POA: Insufficient documentation

## 2023-10-09 DIAGNOSIS — F411 Generalized anxiety disorder: Secondary | ICD-10-CM | POA: Diagnosis not present

## 2023-10-09 DIAGNOSIS — F331 Major depressive disorder, recurrent, moderate: Secondary | ICD-10-CM | POA: Insufficient documentation

## 2023-10-09 DIAGNOSIS — F5101 Primary insomnia: Secondary | ICD-10-CM | POA: Diagnosis not present

## 2023-10-09 DIAGNOSIS — Z794 Long term (current) use of insulin: Secondary | ICD-10-CM

## 2023-10-10 NOTE — Progress Notes (Signed)
Patient ID: Rhonda Thomas, female   DOB: [birth year], 67 y.o.   MRN: [id number] [name]   DOB: May 03, 1956  MR Number: [id number]  Location: [facility] [address] [place], [address] [place] [address] Dept: [phone]  Start: 4 PM End: 5 PM  Today's visit was an in person visit that lasted for 1 hour and was conducted in my outpatient clinic office with the patient myself present.  Provider/Observer:     Hershal Coria PsyD  Chief Complaint:      Chief Complaint  Patient presents with   Pain   Anxiety   ADHD   Stress     Reason For Service:     I have worked with the patient for several years now. She was initially referred here for difficulty coping with the situation involving her children. Her youngest son is at severe difficulties over the years with regard to mood disorder, substance abuse and behavioral problems. Now there are major stressors associated with him again the patient has been essentially overwhelmed and unsure about what to do about. She reports that this has created a lot of stress both at home and at work. On top of that, her diabetes has gotten to the point that she had hammertoe amputated because of that. She has neuropathy as a result of diabetes as well and has had to stay out of her foot for 8 weeks.  The psychosocial stressors are having significant negative impact upon her severe diabetes with neuropathy and loss of toe.  The patient continues to have recurrent issues with residual effects of her type 2 diabetes with significant medical complications.  The reason for service has been reviewed and remains applicable for the current visit.  The patient reports that she is still dealing with anxiety and depressive type symptomatology and continues to take her psychotropic medications including Cymbalta, Trileptal and also taking Lyrica for  peripheral neuropathy.   Interventions Strategy:  Cognitive/behavioral therapeutic interventions and working on issues related to stress and her brittle diabetes/pain issues.  The patient has continued to struggle with significant sleep difficulties and we also worked on sleep hygiene issues.  The patient previously been diagnosed with sleep apnea and prescribed CPAP but she developed ear difficulties that were associated with the CPAP device and is no longer able to use it.  The patient recently had a consult with Dr. Frances Furbish regarding assessment for obstructive sleep apnea and she is being scheduled for formal sleep study and assessment of whether she would qualify for the inspire implanted device for her sleep apnea.  Participation Level:   Active  Participation Quality:  Appropriate      Behavioral Observation:  Well Groomed, Alert, and Appropriate.   Current Psychosocial Factors:   The patient has continued with significant stressors going on.  Her next-door neighbor has stage IV cancer and has a lot of request for the patient to help the neighbor out in ways that are more than the patient herself can do and her kids continue to demanded extreme amount of her time and resources.  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  Content of Session:   Reviewed current symptoms and continue to work on coping skills in dealing with  both her medical issues as well as the stressors.  Current Status:   The patient reports that she has been able to finish her spinal cord stimulator surgery and reports that she is experiencing a 50-60% improvement in her pain levels.     Last Reviewed:   10/09/2023  Goals Addressed Today:    Goals addressed included building better coping skills.  Impression/Diagnosis:   The patient has a long history of attention deficit disorder  but due to Maj. psychosocial stressors she also developed clinical depression and anxiety. I think that her attentional problems were valid prior to the development of these issues that existed her entire life. However, she is in and repeat if and recurrent trauma with regard primarily to her son and to her daughter to a lesser degree in years past.     Hershal Coria, PsyD 10/10/2023

## 2023-10-25 DIAGNOSIS — Z1283 Encounter for screening for malignant neoplasm of skin: Secondary | ICD-10-CM | POA: Diagnosis not present

## 2023-10-25 DIAGNOSIS — L570 Actinic keratosis: Secondary | ICD-10-CM | POA: Diagnosis not present

## 2023-10-25 DIAGNOSIS — L304 Erythema intertrigo: Secondary | ICD-10-CM | POA: Diagnosis not present

## 2023-10-25 DIAGNOSIS — L98499 Non-pressure chronic ulcer of skin of other sites with unspecified severity: Secondary | ICD-10-CM | POA: Diagnosis not present

## 2023-10-25 DIAGNOSIS — D225 Melanocytic nevi of trunk: Secondary | ICD-10-CM | POA: Diagnosis not present

## 2023-10-25 DIAGNOSIS — D485 Neoplasm of uncertain behavior of skin: Secondary | ICD-10-CM | POA: Diagnosis not present

## 2023-10-25 DIAGNOSIS — L821 Other seborrheic keratosis: Secondary | ICD-10-CM | POA: Diagnosis not present

## 2023-10-25 NOTE — Telephone Encounter (Unsigned)
Copied from CRM [id number]. Topic: Clinical - Medical Advice >> Oct 25, 2023  2:51 PM Raven B wrote: Reason for CRM: Rhonda Thomas ([facility]) call back # [phone] (prior authorization department) needs authorization for Trulicity 4.5

## 2023-10-27 ENCOUNTER — Telehealth: Payer: Self-pay

## 2023-10-27 ENCOUNTER — Other Ambulatory Visit (HOSPITAL_COMMUNITY): Payer: Self-pay

## 2023-10-27 NOTE — Telephone Encounter (Signed)
Pharmacy Patient Advocate Encounter   Received notification from Physician's Office that prior authorization for TRULICITY 4.5MG /0.5ML is required/requested.   Insurance verification completed.   The patient is insured through [facility] ADVANTAGE/RX ADVANCE .  Per test claim: Refill too soon. PA is not needed at this time. Medication was filled 10/25/23. Next eligible fill date is 11/15/23.

## 2023-11-01 ENCOUNTER — Ambulatory Visit (INDEPENDENT_AMBULATORY_CARE_PROVIDER_SITE_OTHER): Payer: BC Managed Care – PPO

## 2023-11-01 VITALS — Ht 69.0 in | Wt 189.0 lb

## 2023-11-01 DIAGNOSIS — Z0000 Encounter for general adult medical examination without abnormal findings: Secondary | ICD-10-CM

## 2023-11-01 DIAGNOSIS — Z1231 Encounter for screening mammogram for malignant neoplasm of breast: Secondary | ICD-10-CM

## 2023-11-01 NOTE — Progress Notes (Signed)
Because this visit was a virtual/telehealth visit,  certain criteria was not obtained, such a blood pressure, CBG if applicable, and timed get up and go. Any medications not marked as "taking" were not mentioned during the medication reconciliation part of the visit. Any vitals not documented were not able to be obtained due to this being a telehealth visit or patient was unable to self-report a recent blood pressure reading due to a lack of equipment at home via telehealth. Vitals that have been documented are verbally provided by the patient.   Subjective:   Rhonda Thomas is a 67 y.o. female who presents for Medicare Annual (Subsequent) preventive examination.  Visit Complete: Virtual I connected with  Rhonda Thomas on 11/01/23 by a audio enabled telemedicine application and verified that I am speaking with the correct person using two identifiers.  Patient Location: Home  Provider Location: Home Office  I discussed the limitations of evaluation and management by telemedicine. The patient expressed understanding and agreed to proceed.  Vital Signs: Because this visit was a virtual/telehealth visit, some criteria may be missing or patient reported. Any vitals not documented were not able to be obtained and vitals that have been documented are patient reported.  Patient Medicare AWV questionnaire was completed by the patient on na; I have confirmed that all information answered by patient is correct and no changes since this date.  Cardiac Risk Factors include: advanced age (>1men, >40 women);diabetes mellitus;dyslipidemia;hypertension     Objective:    Today's Vitals   11/01/23 1333  Weight: 189 lb (85.7 kg)  Height: 5\' 9"  (1.753 m)   Body mass index is 27.91 kg/m.     11/01/2023    1:33 PM 06/29/2023   11:08 AM 09/30/2022    9:16 AM 09/29/2021    9:11 AM 06/26/2019    9:58 AM 07/20/2018   10:20 AM 04/14/2016    4:38 PM  Advanced Directives  Does Patient Have a  Medical Advance Directive? No No No No No No No  Would patient like information on creating a medical advance directive? No - Patient declined  No - Patient declined No - Patient declined  No - Patient declined     Current Medications (verified) Outpatient Encounter Medications as of 11/01/2023  Medication Sig   BELBUCA 300 MCG FILM Take 1 Film by mouth every 12 (twelve) hours.   carbidopa-levodopa (SINEMET IR) 25-100 MG tablet Take 1 tablet by mouth in the morning and at bedtime.   Continuous Glucose Receiver (FREESTYLE LIBRE 3 READER) DEVI 1 each by Does not apply route every 14 (fourteen) days.   Continuous Glucose Sensor (FREESTYLE LIBRE 3 SENSOR) MISC by Does not apply route.   DULoxetine (CYMBALTA) 60 MG capsule TAKE 1 CAPSULE AT BEDTIME   fluconazole (DIFLUCAN) 150 MG tablet Take 150 mg by mouth daily.   ibuprofen (ADVIL) 800 MG tablet Take 800 mg by mouth every 8 (eight) hours as needed.   insulin aspart (NOVOLOG FLEXPEN) 100 UNIT/ML FlexPen Inject 3-20 Units into the skin 3 (three) times daily with meals.   Insulin Glargine (BASAGLAR KWIKPEN) 100 UNIT/ML Inject 30 Units into the skin daily.   Insulin Pen Needle (GLOBAL EASE INJECT PEN NEEDLES) 31G X 5 MM MISC USE FOUR TIMES DAILY   metFORMIN (GLUCOPHAGE) 500 MG tablet TAKE 1 TABLET TWICE DAILY with meals.   OXcarbazepine (TRILEPTAL) 150 MG tablet TAKE 1 TABLET BY MOUTH TWICE DAILY   pregabalin (LYRICA) 150 MG capsule 1 in am 2 at  night   rOPINIRole (REQUIP) 1 MG tablet TAKE 1 TABLET BY MOUTH AT BEDTIME   rosuvastatin (CRESTOR) 5 MG tablet Take 1 tablet (5 mg total) by mouth at bedtime.   Semaglutide, 2 MG/DOSE, 8 MG/3ML SOPN Inject 2 mg as directed once a week.   VITAMIN D PO Take 1,000 Units by mouth daily.   No facility-administered encounter medications on file as of 11/01/2023.    Allergies (verified) Canagliflozin and Pioglitazone   History: Past Medical History:  Diagnosis Date   Anxiety    Attention deficit disorder  (ADD)    Depression    Diabetes (HCC)    Hiatal hernia    Movement disorder    Neuropathy    Schatzki's ring    Sleep apnea    Past Surgical History:  Procedure Laterality Date   ABDOMINAL HYSTERECTOMY     BREAST LUMPECTOMY WITH RADIOACTIVE SEED LOCALIZATION Right 04/25/2016   Procedure: RIGHT BREAST LUMPECTOMY WITH RADIOACTIVE SEED LOCALIZATION;  Surgeon: Chevis Pretty III, MD;  Location: [facility];  Service: General;  Laterality: Right;   CATARACT EXTRACTION, BILATERAL Bilateral    CHOLECYSTECTOMY     RECTOCELE REPAIR     SPINAL CORD STIMULATOR IMPLANT     TOE AMPUTATION Right    2nd toe rt foot   TUBAL LIGATION     Family History  Problem Relation Age of Onset   Stroke Mother    Sleep apnea Mother    Diabetes Father    Heart disease Father        pacemaker   Sleep apnea Father    Diabetes Other    Heart Problems Other    Colon cancer Neg Hx    Breast cancer Neg Hx    Social History   Socioeconomic History   Marital status: Married    Spouse name: Fayrene Fearing   Number of children: 3   Years of education: college   Highest education level: Master's degree (e.g., MA, MS, MEng, MEd, MSW, MBA)  Occupational History   Occupation: Retired    Associate Professor: [employer]    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 Fayrene Fearing).   Education- College   Right handed.   Caffeine- occasional soft drink      Social Determinants of Health   Financial Resource Strain: Low Risk  (11/01/2023)   Overall Financial Resource Strain (CARDIA)    Difficulty of Paying Living Expenses:  Not hard at all  Food Insecurity: No Food Insecurity (11/01/2023)   Hunger Vital Sign    Worried About Running Out of Food in the Last Year: Never true    Ran Out of Food in the Last Year: Never true  Transportation Needs: No Transportation Needs (11/01/2023)   PRAPARE - Administrator, Civil Service (Medical): No    Lack of Transportation (Non-Medical): No  Physical Activity: Sufficiently Active (11/01/2023)   Exercise Vital Sign    Days of Exercise per Week: 7 days    Minutes of Exercise per Session: 30 min  Stress: No Stress  Concern Present (11/01/2023)   [employer] of Occupational Health - Occupational Stress Questionnaire    Feeling of Stress : Not at all  Social Connections: Moderately Integrated (11/01/2023)   Social Connection and Isolation Panel [NHANES]    Frequency of Communication with Friends and Family: More than three times a week    Frequency of Social Gatherings with Friends and Family: More than three times a week    Attends Religious Services: More than 4 times per year    Active Member of [employer] or Organizations: No    Attends Engineer, structural: Never    Marital Status: Married    Tobacco Counseling Counseling given: Yes   Clinical Intake:  Pre-visit preparation completed: Yes  Pain : No/denies pain     BMI - recorded: 27.91 Nutritional Status: BMI 25 -29 Overweight Nutritional Risks: None Diabetes: Yes CBG done?: No (telehealth visit.) Did pt. bring in CBG monitor from home?: No  How often do you need to have someone help you when you read instructions, pamphlets, or other written materials from your doctor or pharmacy?: 1 - Never  Interpreter Needed?: No  Information entered by :: Maryjean Ka CMA   Activities of Daily Living    11/01/2023    1:40 PM  In your present state of health, do you have any difficulty performing the following activities:  Hearing? 0  Vision? 0  Difficulty concentrating or making  decisions? 0  Walking or climbing stairs? 0  Dressing or bathing? 0  Doing errands, shopping? 0  Preparing Food and eating ? N  Using the Toilet? N  In the past six months, have you accidently leaked urine? N  Do you have problems with loss of bowel control? N  Managing your Medications? N  Managing your Finances? N  Housekeeping or managing your Housekeeping? N    Patient Care Team: Dettinger, Elige Radon, MD as PCP - General (Family Medicine) Lenor Coffin., MD as Physician Assistant (Pain Medicine) Danella Maiers, [facility] as Pharmacist (Family Medicine)  Indicate any recent Medical Services you may have received from other than [facility] providers in the past year (date may be approximate).     Assessment:   This is a routine wellness examination for Rhonda Thomas.  Hearing/Vision screen Hearing Screening - Comments:: Patient denies any hearing difficulties.   Vision Screening - Comments:: Wears rx glasses - up to date with routine eye exams  Sees Dr. Luciana Axe for diabetic eye exams   Goals Addressed             This Visit's Progress    Patient Stated       I'd like to retire       Depression Screen    11/01/2023    1:36 PM 08/04/2023    1:57 PM 05/04/2023   11:04 AM 01/30/2023   10:44 AM 10/28/2022   10:35 AM 09/30/2022    9:13 AM 07/29/2022    3:12 PM  PHQ 2/9 Scores  PHQ - 2 Score 0 0 0 0 0 0 2  PHQ- 9 Score       9    Fall Risk    11/01/2023    1:40 PM 08/04/2023    1:57 PM 05/04/2023   11:04 AM 01/30/2023   10:44 AM 10/28/2022   10:35 AM  Fall Risk   Falls in the past year? 0 0 0 0 1  Number falls in past yr: 0    1  Injury with Fall?  0    0  Risk for fall due to : No Fall Risks    Orthopedic patient;History of fall(s);Impaired balance/gait  Follow up Falls prevention discussed    Falls evaluation completed    MEDICARE RISK AT HOME: Medicare Risk at Home Any stairs in or around the home?: No If so, are there any without handrails?: No Home free of loose  throw rugs in walkways, pet beds, electrical cords, etc?: Yes Adequate lighting in your home to reduce risk of falls?: Yes Life alert?: No Use of a cane, walker or w/c?: No Grab bars in the bathroom?: No Shower chair or bench in shower?: No Elevated toilet seat or a handicapped toilet?: No  TIMED UP AND GO:  Was the test performed?  No    Cognitive Function:    07/20/2018   10:23 AM  MMSE - Mini Mental State Exam  Orientation to time 5  Orientation to Place 5  Registration 3  Attention/ Calculation 5  Recall 3  Language- name 2 objects 2  Language- repeat 1  Language- follow 3 step command 3  Language- read & follow direction 1  Write a sentence 1  Copy design 1  Total score 30        11/01/2023    1:35 PM 09/30/2022    9:20 AM 09/29/2021    9:18 AM  6CIT Screen  What Year? 0 points 0 points 0 points  What month? 0 points 0 points 0 points  What time? 0 points 3 points 0 points  Count back from 20 0 points 0 points 0 points  Months in reverse 0 points 0 points 0 points  Repeat phrase 0 points 0 points 2 points  Total Score 0 points 3 points 2 points    Immunizations Immunization History  Administered Date(s) Administered   Fluad Quad(high Dose 65+) 09/23/2021, 01/30/2023   Influenza, Seasonal, Injecte, Preservative Fre 09/28/2015   Influenza,inj,Quad PF,6+ Mos 09/17/2014, 11/29/2016, 09/29/2017, 10/11/2018, 11/21/2019, 09/14/2020   Influenza-Unspecified 09/10/2013   PNEUMOCOCCAL CONJUGATE-20 08/04/2023   Pneumococcal Conjugate-13 11/07/2017   Tdap 05/31/2017   Zoster Recombinant(Shingrix) 07/29/2022, 10/28/2022   Zoster, Live 06/23/2015    TDAP status: Up to date  Flu Vaccine status: Due, Education has been provided regarding the importance of this vaccine. Advised may receive this vaccine at local pharmacy or Health Dept. Aware to provide a copy of the vaccination record if obtained from local pharmacy or Health Dept. Verbalized acceptance and  understanding.  Pneumococcal vaccine status: Up to date  Covid-19 vaccine status: Information provided on how to obtain vaccines.   Qualifies for Shingles Vaccine? Yes   Zostavax completed Yes   Shingrix Completed?: Yes  Screening Tests Health Maintenance  Topic Date Due   COVID-19 Vaccine (1) Never done   INFLUENZA VACCINE  07/13/2023   Medicare Annual Wellness (AWV)  10/01/2023   Diabetic kidney evaluation - Urine ACR  10/29/2023   Colonoscopy  01/31/2024 (Originally 09/10/2022)   MAMMOGRAM  02/03/2024   HEMOGLOBIN A1C  02/04/2024   OPHTHALMOLOGY EXAM  05/17/2024   Diabetic kidney evaluation - eGFR measurement  08/03/2024   FOOT EXAM  08/03/2024   DTaP/Tdap/Td (2 - Td or Tdap) 06/01/2027   Pneumonia Vaccine 21+ Years old  Completed   DEXA SCAN  Completed   Hepatitis C Screening  Completed   Zoster Vaccines- Shingrix  Completed   HPV VACCINES  Aged Out    Health Maintenance  Health Maintenance Due  Topic Date Due  COVID-19 Vaccine (1) Never done   INFLUENZA VACCINE  07/13/2023   Medicare Annual Wellness (AWV)  10/01/2023   Diabetic kidney evaluation - Urine ACR  10/29/2023    Colorectal Cancer Screening: Patient declined colorectal cancer screening   Mammogram status: Ordered 11/01/2023. Pt provided with contact info and advised to call to schedule appt.   Bone Density status: Completed 02/02/2022. Results reflect: Bone density results: NORMAL. Repeat every 2 years.  Lung Cancer Screening: (Low Dose CT Chest recommended if Age 75-80 years, 20 pack-year currently smoking OR have quit w/in 15years.) does not qualify.   Lung Cancer Screening Referral: na  Additional Screening:  Hepatitis C Screening: does not qualify; Completed 11/29/2016  Vision Screening: Recommended annual ophthalmology exams for early detection of glaucoma and other disorders of the eye. Is the patient up to date with their annual eye exam?  Yes  Who is the provider or what is the name of  the office in which the patient attends annual eye exams? Dr. Thurnell Lose If pt is not established with a provider, would they like to be referred to a provider to establish care? No .   Dental Screening: Recommended annual dental exams for proper oral hygiene  Diabetic Foot Exam: Diabetic Foot Exam: Completed 08/04/2023  Community Resource Referral / Chronic Care Management: CRR required this visit?  No   CCM required this visit?  No     Plan:     I have personally reviewed and noted the following in the patient's chart:   Medical and social history Use of alcohol, tobacco or illicit drugs  Current medications and supplements including opioid prescriptions. Patient is not currently taking opioid prescriptions. Functional ability and status Nutritional status Physical activity Advanced directives List of other physicians Hospitalizations, surgeries, and ER visits in previous 12 months Vitals Screenings to include cognitive, depression, and falls Referrals and appointments  In addition, I have reviewed and discussed with patient certain preventive protocols, quality metrics, and best practice recommendations. A written personalized care plan for preventive services as well as general preventive health recommendations were provided to patient.     Jordan Hawks Noriah Osgood, CMA   11/01/2023   After Visit Summary: (MyChart) Due to this being a telephonic visit, the after visit summary with patients personalized plan was offered to patient via MyChart   Nurse Notes: see routing comment

## 2023-11-01 NOTE — Patient Instructions (Signed)
Rhonda Thomas , Thank you for taking time to come for your Medicare Wellness Visit. I appreciate your ongoing commitment to your health goals. Please review the following plan we discussed and let me know if I can assist you in the future.   Referrals/Orders/Follow-Ups/Clinician Recommendations:  Next Medicare Annual Wellness Visit: November 01, 2024 at 12:30 pm virtual appt  You have an order for:  []   2D Mammogram  [x]   3D Mammogram  []   Bone Density     Please call for appointment:   [facility] [phone]  Make sure to wear two-piece clothing.  No lotions powders or deodorants the day of the appointment Make sure to bring picture ID and insurance card.  Bring list of medications you are currently taking including any supplements.   Schedule your [place] screening mammogram through MyChart!   Log into your MyChart account.  Go to 'Visit' (or 'Appointments' if on mobile App) --> Schedule an Appointment  Under 'Select a Reason for Visit' choose the Mammogram Screening option.  Complete the pre-visit questions and select the time and place that best fits your schedule.   This is a list of the screening recommended for you and due dates:  Health Maintenance  Topic Date Due   COVID-19 Vaccine (1) Never done   Mammogram  02/02/2023   Flu Shot  07/13/2023   Yearly kidney health urinalysis for diabetes  10/29/2023   Colon Cancer Screening  01/31/2024*   DEXA scan (bone density measurement)  02/03/2024   Hemoglobin A1C  02/04/2024   Eye exam for diabetics  05/17/2024   Yearly kidney function blood test for diabetes  08/03/2024   Complete foot exam   08/03/2024   Medicare Annual Wellness Visit  10/31/2024   DTaP/Tdap/Td vaccine (2 - Td or Tdap) 06/01/2027   Pneumonia Vaccine  Completed   Hepatitis C Screening  Completed   Zoster (Shingles) Vaccine  Completed   HPV Vaccine  Aged Out  *Topic was postponed. The date shown is not the original  due date.    Advanced directives: (ACP Link)Information on Advanced Care Planning can be found at [facility] Directives Advance Health Care Directives ([email or web address]   Next Medicare Annual Wellness Visit scheduled for next year: Yes Understanding Your Risk for Falls Millions of people have serious injuries from falls each year. It is important to understand your risk of falling. Talk with your health care provider about your risk and what you can do to lower it. If you do have a serious fall, make sure to tell your provider. Falling once raises your risk of falling again. How can falls affect me? Serious injuries from falls are common. These include: Broken bones, such as hip fractures. Head injuries, such as traumatic brain injuries (TBI) or concussions. A fear of falling can cause you to avoid activities and stay at home. This can make your muscles weaker and raise your risk for a fall. What can increase my risk? There are a number of risk factors that increase your risk for falling. The more risk factors you have, the higher your risk of falling. Serious injuries from a fall happen most often to people who are older than 67 years old. Teenagers and young adults ages 48-29 are also at higher risk. Common risk factors include: Weakness in the lower body. Being generally weak or confused due to long-term (chronic) illness. Dizziness or balance problems. Poor vision. Medicines that cause  dizziness or drowsiness. These may include: Medicines for your blood pressure, heart, anxiety, insomnia, or swelling (edema). Pain medicines. Muscle relaxants. Other risk factors include: Drinking alcohol. Having had a fall in the past. Having foot pain or wearing improper footwear. Working at a dangerous job. Having any of the following in your home: Tripping hazards, such as floor clutter or loose rugs. Poor lighting. Pets. Having dementia or memory  loss. What actions can I take to lower my risk of falling?     Physical activity Stay physically fit. Do strength and balance exercises. Consider taking a regular class to build strength and balance. Yoga and tai chi are good options. Vision Have your eyes checked every year and your prescription for glasses or contacts updated as needed. Shoes and walking aids Wear non-skid shoes. Wear shoes that have rubber soles and low heels. Do not wear high heels. Do not walk around the house in socks or slippers. Use a cane or walker as told by your provider. Home safety Attach secure railings on both sides of your stairs. Install grab bars for your bathtub, shower, and toilet. Use a non-skid mat in your bathtub or shower. Attach bath mats securely with double-sided, non-slip rug tape. Use good lighting in all rooms. Keep a flashlight near your bed. Make sure there is a clear path from your bed to the bathroom. Use night-lights. Do not use throw rugs. Make sure all carpeting is taped or tacked down securely. Remove all clutter from walkways and stairways, including extension cords. Repair uneven or broken steps and floors. Avoid walking on icy or slippery surfaces. Walk on the grass instead of on icy or slick sidewalks. Use ice melter to get rid of ice on walkways in the winter. Use a cordless phone. Questions to ask your health care provider Can you help me check my risk for a fall? Do any of my medicines make me more likely to fall? Should I take a vitamin D supplement? What exercises can I do to improve my strength and balance? Should I make an appointment to have my vision checked? Do I need a bone density test to check for weak bones (osteoporosis)? Would it help to use a cane or a walker? Where to find more information Centers for Disease Control and Prevention, STEADI: TonerPromos.no Community-Based Fall Prevention Programs: TonerPromos.no [employer] on Aging: BaseRingTones.pl Contact a health  care provider if: You fall at home. You are afraid of falling at home. You feel weak, drowsy, or dizzy. This information is not intended to replace advice given to you by your health care provider. Make sure you discuss any questions you have with your health care provider. Document Revised: 08/01/2022 Document Reviewed: 08/01/2022 Elsevier Patient Education  2024 [employer]. Preventive Care 65 Years and Older, Female Preventive care refers to lifestyle choices and visits with your health care provider that can promote health and wellness. Preventive care visits are also called wellness exams. What can I expect for my preventive care visit? Counseling Your health care provider may ask you questions about your: Medical history, including: Past medical problems. Family medical history. Pregnancy and menstrual history. History of falls. Current health, including: Memory and ability to understand (cognition). Emotional well-being. Home life and relationship well-being. Sexual activity and sexual health. Lifestyle, including: Alcohol, nicotine or tobacco, and drug use. Access to firearms. Diet, exercise, and sleep habits. Work and work Astronomer. Sunscreen use. Safety issues such as seatbelt and bike helmet use. Physical exam Your health care  provider will check your: Height and weight. These may be used to calculate your BMI (body mass index). BMI is a measurement that tells if you are at a healthy weight. Waist circumference. This measures the distance around your waistline. This measurement also tells if you are at a healthy weight and may help predict your risk of certain diseases, such as type 2 diabetes and high blood pressure. Heart rate and blood pressure. Body temperature. Skin for abnormal spots. What immunizations do I need?  Vaccines are usually given at various ages, according to a schedule. Your health care provider will recommend vaccines for you based on your  age, medical history, and lifestyle or other factors, such as travel or where you work. What tests do I need? Screening Your health care provider may recommend screening tests for certain conditions. This may include: Lipid and cholesterol levels. Hepatitis C test. Hepatitis B test. HIV (human immunodeficiency virus) test. STI (sexually transmitted infection) testing, if you are at risk. Lung cancer screening. Colorectal cancer screening. Diabetes screening. This is done by checking your blood sugar (glucose) after you have not eaten for a while (fasting). Mammogram. Talk with your health care provider about how often you should have regular mammograms. BRCA-related cancer screening. This may be done if you have a family history of breast, ovarian, tubal, or peritoneal cancers. Bone density scan. This is done to screen for osteoporosis. Talk with your health care provider about your test results, treatment options, and if necessary, the need for more tests. Follow these instructions at home: Eating and drinking  Eat a diet that includes fresh fruits and vegetables, whole grains, lean protein, and low-fat dairy products. Limit your intake of foods with high amounts of sugar, saturated fats, and salt. Take vitamin and mineral supplements as recommended by your health care provider. Do not drink alcohol if your health care provider tells you not to drink. If you drink alcohol: Limit how much you have to 0-1 drink a day. Know how much alcohol is in your drink. In the [place] one drink equals one 12 oz bottle of beer (355 mL), one 5 oz glass of wine (148 mL), or one 1 oz glass of hard liquor (44 mL). Lifestyle Brush your teeth every morning and night with fluoride toothpaste. Floss one time each day. Exercise for at least 30 minutes 5 or more days each week. Do not use any products that contain nicotine or tobacco. These products include cigarettes, chewing tobacco, and vaping devices, such as  e-cigarettes. If you need help quitting, ask your health care provider. Do not use drugs. If you are sexually active, practice safe sex. Use a condom or other form of protection in order to prevent STIs. Take aspirin only as told by your health care provider. Make sure that you understand how much to take and what form to take. Work with your health care provider to find out whether it is safe and beneficial for you to take aspirin daily. Ask your health care provider if you need to take a cholesterol-lowering medicine (statin). Find healthy ways to manage stress, such as: Meditation, yoga, or listening to music. Journaling. Talking to a trusted person. Spending time with friends and family. Minimize exposure to UV radiation to reduce your risk of skin cancer. Safety Always wear your seat belt while driving or riding in a vehicle. Do not drive: If you have been drinking alcohol. Do not ride with someone who has been drinking. When you are tired or  distracted. While texting. If you have been using any mind-altering substances or drugs. Wear a helmet and other protective equipment during sports activities. If you have firearms in your house, make sure you follow all gun safety procedures. What's next? Visit your health care provider once a year for an annual wellness visit. Ask your health care provider how often you should have your eyes and teeth checked. Stay up to date on all vaccines. This information is not intended to replace advice given to you by your health care provider. Make sure you discuss any questions you have with your health care provider. Document Revised: 05/26/2021 Document Reviewed: 05/26/2021 Elsevier Patient Education  2024 [employer].

## 2023-11-08 ENCOUNTER — Ambulatory Visit (INDEPENDENT_AMBULATORY_CARE_PROVIDER_SITE_OTHER): Payer: BC Managed Care – PPO | Admitting: Family Medicine

## 2023-11-08 ENCOUNTER — Encounter: Payer: Self-pay | Admitting: Family Medicine

## 2023-11-08 VITALS — BP 104/68 | HR 68 | Temp 97.1°F | Ht 69.0 in | Wt 189.0 lb

## 2023-11-08 DIAGNOSIS — E1149 Type 2 diabetes mellitus with other diabetic neurological complication: Secondary | ICD-10-CM

## 2023-11-08 DIAGNOSIS — L97521 Non-pressure chronic ulcer of other part of left foot limited to breakdown of skin: Secondary | ICD-10-CM | POA: Diagnosis not present

## 2023-11-08 DIAGNOSIS — E113293 Type 2 diabetes mellitus with mild nonproliferative diabetic retinopathy without macular edema, bilateral: Secondary | ICD-10-CM

## 2023-11-08 DIAGNOSIS — E785 Hyperlipidemia, unspecified: Secondary | ICD-10-CM

## 2023-11-08 DIAGNOSIS — Z794 Long term (current) use of insulin: Secondary | ICD-10-CM

## 2023-11-08 DIAGNOSIS — Z23 Encounter for immunization: Secondary | ICD-10-CM | POA: Diagnosis not present

## 2023-11-08 DIAGNOSIS — F411 Generalized anxiety disorder: Secondary | ICD-10-CM

## 2023-11-08 DIAGNOSIS — E1142 Type 2 diabetes mellitus with diabetic polyneuropathy: Secondary | ICD-10-CM

## 2023-11-08 DIAGNOSIS — E1049 Type 1 diabetes mellitus with other diabetic neurological complication: Secondary | ICD-10-CM

## 2023-11-08 LAB — BAYER DCA HB A1C WAIVED: HB A1C (BAYER DCA - WAIVED): 6.6 % — ABNORMAL HIGH (ref 4.8–5.6)

## 2023-11-08 MED ORDER — FREESTYLE LIBRE 3 PLUS SENSOR MISC
3 refills | Status: DC
Start: 2023-11-08 — End: 2024-08-27

## 2023-11-08 MED ORDER — BASAGLAR KWIKPEN 100 UNIT/ML ~~LOC~~ SOPN: 30.0000 [IU] | PEN_INJECTOR | Freq: Every day | SUBCUTANEOUS | 3 refills | Status: DC

## 2023-11-08 MED ORDER — NOVOLOG FLEXPEN 100 UNIT/ML ~~LOC~~ SOPN: 3.0000 [IU] | PEN_INJECTOR | Freq: Three times a day (TID) | SUBCUTANEOUS | 3 refills | Status: DC

## 2023-11-08 MED ORDER — DULOXETINE HCL 60 MG PO CPEP
60.0000 mg | ORAL_CAPSULE | Freq: Every day | ORAL | 3 refills | Status: DC
Start: 2023-11-08 — End: 2024-08-22

## 2023-11-08 MED ORDER — METFORMIN HCL 500 MG PO TABS
500.0000 mg | ORAL_TABLET | Freq: Two times a day (BID) | ORAL | 3 refills | Status: DC
Start: 2023-11-08 — End: 2024-08-22

## 2023-11-08 NOTE — Progress Notes (Signed)
BP 104/68   Pulse 68   Temp (!) 97.1 F (36.2 C) (Temporal)   Ht 5\' 9"  (1.753 m)   Wt 189 lb (85.7 kg)   SpO2 95%   BMI 27.91 kg/m    Subjective:   Patient ID: Rhonda Thomas, female    DOB: 1956-08-05, 67 y.o.   MRN: [id number]  HPI: Rhonda Thomas is a 67 y.o. female presenting on 11/08/2023 for Medical Management of Chronic Issues (3 month)   HPI Type 2 diabetes mellitus Patient comes in today for recheck of his diabetes. Patient has been currently taking Basaglar and NovoLog and Ozempic.  She did not bring her meter in with her today but she says it has been running up a little bit more than normal.. Patient is not currently on an ACE inhibitor/ARB. Patient has seen an ophthalmologist this year. Patient denies any new issues with their feet. The symptom started onset as an adult retinopathy and hyperlipidemia and neuropathy ARE RELATED TO DM   Hyperlipidemia Patient is coming in for recheck of his hyperlipidemia. The patient is currently taking Crestor. They deny any issues with myalgias or history of liver damage from it. They deny any focal numbness or weakness or chest pain.   Anxiety disorder recheck. Patient is coming in today for anxiety disorder and she currently takes Cymbalta to help with the pain and also help with anxiety.  She also takes Requip and Lyrica from her neurologist.  Relevant past medical, surgical, family and social history reviewed and updated as indicated. Interim medical history since our last visit reviewed. Allergies and medications reviewed and updated.  Review of Systems  Constitutional:  Negative for chills and fever.  HENT:  Negative for congestion, ear discharge and ear pain.   Eyes:  Negative for visual disturbance.  Respiratory:  Negative for chest tightness and shortness of breath.   Cardiovascular:  Negative for chest pain and leg swelling.  Genitourinary:  Negative for difficulty urinating and dysuria.  Musculoskeletal:   Negative for back pain and gait problem.  Skin:  Negative for rash.  Neurological:  Negative for dizziness, light-headedness and headaches.  Psychiatric/Behavioral:  Negative for agitation and behavioral problems.   All other systems reviewed and are negative.   Per HPI unless specifically indicated above   Allergies as of 11/08/2023       Reactions   Canagliflozin Other (See Comments)   vaginitis vaginitis   Pioglitazone Other (See Comments)   Edema Edema        Medication List        Accurate as of November 08, 2023 10:49 AM. If you have any questions, ask your nurse or doctor.          Basaglar KwikPen 100 UNIT/ML Inject 30 Units into the skin daily.   Belbuca 300 MCG Film Generic drug: Buprenorphine HCl Take 1 Film by mouth every 12 (twelve) hours.   carbidopa-levodopa 25-100 MG tablet Commonly known as: SINEMET IR Take 1 tablet by mouth in the morning and at bedtime.   DULoxetine 60 MG capsule Commonly known as: CYMBALTA Take 1 capsule (60 mg total) by mouth at bedtime.   fluconazole 150 MG tablet Commonly known as: DIFLUCAN Take 150 mg by mouth daily.   FreeStyle Libre 3 Plus Sensor Misc Change sensor every 15 days. What changed:  how to take this additional instructions Changed by: Elige Radon Retha Bither   FreeStyle Libre 3 Reader [place] 1 each by Does not apply route every  14 (fourteen) days.   ibuprofen 800 MG tablet Commonly known as: ADVIL Take 800 mg by mouth every 8 (eight) hours as needed.   Insulin Pen Needle 31G X 5 MM Misc Commonly known as: Global Ease Inject Pen Needles USE FOUR TIMES DAILY   metFORMIN 500 MG tablet Commonly known as: GLUCOPHAGE Take 1 tablet (500 mg total) by mouth 2 (two) times daily with a meal.   NovoLOG FlexPen 100 UNIT/ML FlexPen Generic drug: insulin aspart Inject 3-20 Units into the skin 3 (three) times daily with meals.   OXcarbazepine 150 MG tablet Commonly known as: TRILEPTAL TAKE 1 TABLET BY  MOUTH TWICE DAILY   pregabalin 150 MG capsule Commonly known as: LYRICA 1 in am 2 at night   rOPINIRole 1 MG tablet Commonly known as: REQUIP TAKE 1 TABLET BY MOUTH AT BEDTIME   rosuvastatin 5 MG tablet Commonly known as: CRESTOR Take 1 tablet (5 mg total) by mouth at bedtime.   Semaglutide (2 MG/DOSE) 8 MG/3ML Sopn Inject 2 mg as directed once a week.   VITAMIN D PO Take 1,000 Units by mouth daily.         Objective:   BP 104/68   Pulse 68   Temp (!) 97.1 F (36.2 C) (Temporal)   Ht 5\' 9"  (1.753 m)   Wt 189 lb (85.7 kg)   SpO2 95%   BMI 27.91 kg/m   Wt Readings from Last 3 Encounters:  11/08/23 189 lb (85.7 kg)  11/01/23 189 lb (85.7 kg)  08/04/23 192 lb (87.1 kg)    Physical Exam Vitals and nursing note reviewed.  Constitutional:      General: She is not in acute distress.    Appearance: She is well-developed. She is not diaphoretic.  Eyes:     Conjunctiva/sclera: Conjunctivae normal.  Cardiovascular:     Rate and Rhythm: Normal rate and regular rhythm.     Heart sounds: Normal heart sounds. No murmur heard. Pulmonary:     Effort: Pulmonary effort is normal. No respiratory distress.     Breath sounds: Normal breath sounds. No wheezing.  Musculoskeletal:        General: No tenderness. Normal range of motion.  Skin:    General: Skin is warm and dry.     Findings: No rash.  Neurological:     Mental Status: She is alert and oriented to person, place, and time.     Coordination: Coordination normal.  Psychiatric:        Behavior: Behavior normal.       Assessment & Plan:   Problem List Items Addressed This Visit       Endocrine   Mild nonproliferative diabetic retinopathy of both eyes (HCC)   Relevant Medications   insulin aspart (NOVOLOG FLEXPEN) 100 UNIT/ML FlexPen   Insulin Glargine (BASAGLAR KWIKPEN) 100 UNIT/ML   metFORMIN (GLUCOPHAGE) 500 MG tablet   Continuous Glucose Sensor (FREESTYLE LIBRE 3 PLUS SENSOR) MISC   Diabetic peripheral  neuropathy (HCC)   Relevant Medications   DULoxetine (CYMBALTA) 60 MG capsule   insulin aspart (NOVOLOG FLEXPEN) 100 UNIT/ML FlexPen   Insulin Glargine (BASAGLAR KWIKPEN) 100 UNIT/ML   metFORMIN (GLUCOPHAGE) 500 MG tablet   Continuous Glucose Sensor (FREESTYLE LIBRE 3 PLUS SENSOR) MISC   Diabetes mellitus (HCC) - Primary   Relevant Medications   insulin aspart (NOVOLOG FLEXPEN) 100 UNIT/ML FlexPen   Insulin Glargine (BASAGLAR KWIKPEN) 100 UNIT/ML   metFORMIN (GLUCOPHAGE) 500 MG tablet   Continuous Glucose Sensor (FREESTYLE LIBRE  3 PLUS SENSOR) MISC   Other Relevant Orders   Bayer DCA Hb A1c Waived   Microalbumin / creatinine urine ratio     Other   Generalized anxiety disorder   Relevant Medications   DULoxetine (CYMBALTA) 60 MG capsule   Hyperlipidemia   Other Visit Diagnoses     Skin ulcer of left foot, limited to breakdown of skin (HCC)       Relevant Medications   DULoxetine (CYMBALTA) 60 MG capsule       A1c 6.6 today.  Continue to focus on diet monitor blood sugars, bring your sensor in with the next time you come.  Sent libre 3+ sensors because the [place] 3 sensors are no longer available.  No change in medication. Follow up plan: Return in about 3 months (around 02/08/2024), or if symptoms worsen or fail to improve, for Diabetes recheck.  Counseling provided for all of the vaccine components Orders Placed This Encounter  Procedures   Bayer DCA Hb A1c Waived   Microalbumin / creatinine urine ratio    Arville Care, MD [place] [facility] 11/08/2023, 10:50 AM

## 2023-11-16 ENCOUNTER — Encounter: Payer: PPO | Attending: Psychology | Admitting: Psychology

## 2023-11-16 DIAGNOSIS — F411 Generalized anxiety disorder: Secondary | ICD-10-CM | POA: Insufficient documentation

## 2023-11-16 DIAGNOSIS — F331 Major depressive disorder, recurrent, moderate: Secondary | ICD-10-CM | POA: Insufficient documentation

## 2023-11-16 DIAGNOSIS — F5101 Primary insomnia: Secondary | ICD-10-CM | POA: Insufficient documentation

## 2023-11-16 DIAGNOSIS — E1142 Type 2 diabetes mellitus with diabetic polyneuropathy: Secondary | ICD-10-CM | POA: Insufficient documentation

## 2023-11-21 ENCOUNTER — Ambulatory Visit
Admission: RE | Admit: 2023-11-21 | Discharge: 2023-11-21 | Disposition: A | Discharge status: Home / Self Care | Payer: BC Managed Care – PPO | Source: Ambulatory Visit | Attending: Family Medicine | Admitting: Family Medicine

## 2023-11-21 DIAGNOSIS — Z0000 Encounter for general adult medical examination without abnormal findings: Secondary | ICD-10-CM

## 2023-11-21 DIAGNOSIS — Z1231 Encounter for screening mammogram for malignant neoplasm of breast: Secondary | ICD-10-CM

## 2023-11-22 ENCOUNTER — Other Ambulatory Visit: Payer: Self-pay | Admitting: *Deleted

## 2023-11-23 ENCOUNTER — Telehealth: Payer: Self-pay

## 2023-11-23 MED ORDER — TRULICITY 4.5 MG/0.5ML ~~LOC~~ SOAJ: 4.5000 mg | SUBCUTANEOUS | 3 refills | Status: DC

## 2023-11-23 NOTE — Telephone Encounter (Signed)
Sent Trulicity for her

## 2023-11-23 NOTE — Telephone Encounter (Signed)
Source  Rhonda Thomas (Patient)   Subject  Rhonda Thomas (Patient)   Topic  Clinical - Medication Question    Communication  Reason for CRM: Pt is returning missed call in regards to rx, pt stated she is taking Trulicity due to Ozempic being too expensive, pt hasn't taken Ozempic in over a month     New CRM was created. Adding information to already open encounter.

## 2023-11-23 NOTE — Addendum Note (Signed)
Addended by: Arville Care on: 11/23/2023 11:53 AM   Modules accepted: Orders

## 2023-11-23 NOTE — Telephone Encounter (Signed)
Copied from CRM [id number]. Topic: Clinical - Medication Refill >> Nov 22, 2023  4:52 PM Clayton Bibles wrote: Most Recent Primary Care Visit:  Provider: Arville Care A  Department: [facility]  Visit Type: OFFICE VISIT  Date: 11/08/2023  Medication: Trulicity   Has the patient contacted their pharmacy? Yes (Agent: If no, request that the patient contact the pharmacy for the refill. If patient does not wish to contact the pharmacy document the reason why and proceed with request.) (Agent: If yes, when and what did the pharmacy advise?)  Is this the correct pharmacy for this prescription? No - [facility] - # [phone] - Fax # [phone] If no, delete pharmacy and type the correct one.  This is the patient's preferred pharmacy:  [facility] - [place], [place] - [address] [place] [address] Phone: [phone] Fax: [phone]   Has the prescription been filled recently? No   Is the patient out of the medication? Yes - She has not taken med in 2 weeks  Has the patient been seen for an appointment in the last year OR does the patient have an upcoming appointment? Yes  Can we respond through MyChart? No  Agent: Please be advised that Rx refills may take up to 3 business days. We ask that you follow-up with your pharmacy.

## 2023-11-23 NOTE — Telephone Encounter (Signed)
Left detailed message per dpr  

## 2023-11-23 NOTE — Telephone Encounter (Signed)
Lmtcb.

## 2023-11-23 NOTE — Telephone Encounter (Signed)
Please clarify with the patient but I think she is on Ozempic now instead of the Trulicity because of insurance coverage, I think we switched to the Ozempic.  Please clarify whether she is taking the Trulicity or the Ozempic.

## 2023-11-27 ENCOUNTER — Telehealth: Payer: Self-pay | Admitting: Family Medicine

## 2023-11-27 ENCOUNTER — Other Ambulatory Visit (HOSPITAL_COMMUNITY): Payer: Self-pay

## 2023-11-27 ENCOUNTER — Telehealth: Payer: Self-pay

## 2023-11-27 NOTE — Telephone Encounter (Signed)
Copied from CRM [id number]. Topic: Clinical - Prescription Issue >> Nov 27, 2023  3:32 PM Orinda Kenner C wrote: Reason for CRM: [facility] pharmacy [phone] about TRULICITY 4.5 MG/0.5ML SOAJ needs to have Prior Auth# [id number] with chart note and dx code. Key reference # [id number]. Pt c/o does not have no more meds and is in pain.

## 2023-11-27 NOTE — Telephone Encounter (Signed)
Please review

## 2023-11-27 NOTE — Telephone Encounter (Signed)
Pharmacy Patient Advocate Encounter   Received notification from Pt Calls Messages that prior authorization for Trulicity 4.5MG /0.5ML auto-injectors  is required/requested.   Insurance verification completed.   The patient is insured through [facility]   Per test claim: PA required; PA submitted to above mentioned insurance via CoverMyMeds Key/confirmation #/EOC [id number] Status is pending

## 2023-11-27 NOTE — Telephone Encounter (Signed)
PA request has been Submitted. New Encounter created for follow up. For additional info see Pharmacy Prior Auth telephone encounter from 11/27/23.

## 2023-11-28 ENCOUNTER — Other Ambulatory Visit (HOSPITAL_COMMUNITY): Payer: Self-pay

## 2023-11-28 NOTE — Telephone Encounter (Signed)
Pharmacy Patient Advocate Encounter  Received notification from [facility] that Prior Authorization for Trulicity 4.5MG /0.5ML auto-injectors has been APPROVED from 11/27/23 to 11/26/26. Ran test claim, Copay is $234.35. This test claim was processed through [facility]- copay amounts may vary at other pharmacies due to pharmacy/plan contracts, or as the patient moves through the different stages of their insurance plan.   PA #/Case ID/Reference #: [id number]

## 2023-12-14 ENCOUNTER — Encounter: Payer: PPO | Admitting: Psychology

## 2023-12-29 ENCOUNTER — Other Ambulatory Visit (HOSPITAL_COMMUNITY): Payer: Self-pay

## 2023-12-29 ENCOUNTER — Telehealth: Payer: Self-pay | Admitting: Pharmacy Technician

## 2023-12-29 NOTE — Telephone Encounter (Signed)
Pharmacy Patient Advocate Encounter  Received notification from [facility] that Prior Authorization for Rhonda Thomas has been APPROVED from 12/13/2023 to 12/11/2024. Spoke to pharmacy to process.Copay is $40.00.    PA #/Case ID/Reference #: [id number]

## 2024-01-15 ENCOUNTER — Telehealth: Payer: Self-pay | Admitting: Pharmacy Technician

## 2024-01-15 ENCOUNTER — Other Ambulatory Visit (HOSPITAL_COMMUNITY): Payer: Self-pay

## 2024-01-15 NOTE — Telephone Encounter (Signed)
Pharmacy Patient Advocate Encounter   Received notification from CoverMyMeds that prior authorization for Freestyle Libre 3 Sensor is required/requested.   Insurance verification completed.   The patient is insured through [facility] ADVANTAGE/RX ADVANCE .   Per test claim: The current 84 day co-pay is, $0.00.  No PA needed at this time. This test claim was processed through [facility]- copay amounts may vary at other pharmacies due to pharmacy/plan contracts, or as the patient moves through the different stages of their insurance plan.

## 2024-01-16 DIAGNOSIS — L97421 Non-pressure chronic ulcer of left heel and midfoot limited to breakdown of skin: Secondary | ICD-10-CM | POA: Diagnosis not present

## 2024-02-06 DIAGNOSIS — L97421 Non-pressure chronic ulcer of left heel and midfoot limited to breakdown of skin: Secondary | ICD-10-CM | POA: Diagnosis not present

## 2024-02-14 ENCOUNTER — Ambulatory Visit (INDEPENDENT_AMBULATORY_CARE_PROVIDER_SITE_OTHER): Payer: BC Managed Care – PPO | Admitting: Family Medicine

## 2024-02-14 ENCOUNTER — Encounter: Payer: Self-pay | Admitting: Family Medicine

## 2024-02-14 VITALS — BP 113/68 | HR 85 | Ht 69.0 in | Wt 190.0 lb

## 2024-02-14 DIAGNOSIS — Z7984 Long term (current) use of oral hypoglycemic drugs: Secondary | ICD-10-CM

## 2024-02-14 DIAGNOSIS — E113293 Type 2 diabetes mellitus with mild nonproliferative diabetic retinopathy without macular edema, bilateral: Secondary | ICD-10-CM | POA: Diagnosis not present

## 2024-02-14 DIAGNOSIS — E785 Hyperlipidemia, unspecified: Secondary | ICD-10-CM

## 2024-02-14 DIAGNOSIS — E1049 Type 1 diabetes mellitus with other diabetic neurological complication: Secondary | ICD-10-CM

## 2024-02-14 DIAGNOSIS — Z7985 Long-term (current) use of injectable non-insulin antidiabetic drugs: Secondary | ICD-10-CM

## 2024-02-14 DIAGNOSIS — E1142 Type 2 diabetes mellitus with diabetic polyneuropathy: Secondary | ICD-10-CM | POA: Diagnosis not present

## 2024-02-14 DIAGNOSIS — E1149 Type 2 diabetes mellitus with other diabetic neurological complication: Secondary | ICD-10-CM | POA: Diagnosis not present

## 2024-02-14 DIAGNOSIS — R399 Unspecified symptoms and signs involving the genitourinary system: Secondary | ICD-10-CM

## 2024-02-14 DIAGNOSIS — Z780 Asymptomatic menopausal state: Secondary | ICD-10-CM | POA: Diagnosis not present

## 2024-02-14 DIAGNOSIS — E1069 Type 1 diabetes mellitus with other specified complication: Secondary | ICD-10-CM | POA: Diagnosis not present

## 2024-02-14 DIAGNOSIS — Z794 Long term (current) use of insulin: Secondary | ICD-10-CM | POA: Diagnosis not present

## 2024-02-14 LAB — BAYER DCA HB A1C WAIVED: HB A1C (BAYER DCA - WAIVED): 6.8 % — ABNORMAL HIGH (ref 4.8–5.6)

## 2024-02-14 LAB — LIPID PANEL

## 2024-02-14 MED ORDER — NOVOLOG FLEXPEN 100 UNIT/ML ~~LOC~~ SOPN: 3.0000 [IU] | PEN_INJECTOR | Freq: Three times a day (TID) | SUBCUTANEOUS | 3 refills | Status: DC

## 2024-02-14 MED ORDER — ROSUVASTATIN CALCIUM 5 MG PO TABS: 5.0000 mg | ORAL_TABLET | Freq: Every day | ORAL | 3 refills | Status: DC

## 2024-02-14 MED ORDER — BASAGLAR KWIKPEN 100 UNIT/ML ~~LOC~~ SOPN: 30.0000 [IU] | PEN_INJECTOR | Freq: Every day | SUBCUTANEOUS | 3 refills | Status: DC

## 2024-02-14 NOTE — Progress Notes (Signed)
 BP 113/68   Pulse 85   Ht 5\' 9"  (1.753 m)   Wt 190 lb (86.2 kg)   SpO2 98%   BMI 28.06 kg/m    Subjective:   Patient ID: Rhonda Thomas, female    DOB: Mar 17, 1956, 68 y.o.   MRN: [id number]  HPI: Rhonda Thomas is a 68 y.o. female presenting on 02/14/2024 for Medical Management of Chronic Issues and Diabetes   HPI Type 2 diabetes mellitus Patient comes in today for recheck of his diabetes. Patient has been currently taking freestyle libre plus and NovoLog and Hospital doctor and metformin and Trulicity. Patient is not currently on an ACE inhibitor/ARB. Patient has not seen an ophthalmologist this year. Patient sees podiatry and has a sore and takes . The symptom started onset as an adult peripheral neuropathy and retinopathy ARE RELATED TO DM   Hyperlipidemia Patient is coming in for recheck of his hyperlipidemia. The patient is currently taking crestor. They deny any issues with myalgias or history of liver damage from it. They deny any focal numbness or weakness or chest pain.   Relevant past medical, surgical, family and social history reviewed and updated as indicated. Interim medical history since our last visit reviewed. Allergies and medications reviewed and updated.  Review of Systems  Constitutional:  Negative for chills and fever.  HENT:  Negative for congestion, ear discharge and ear pain.   Eyes:  Negative for visual disturbance.  Respiratory:  Negative for chest tightness and shortness of breath.   Cardiovascular:  Negative for chest pain and leg swelling.  Genitourinary:  Negative for difficulty urinating and dysuria.  Musculoskeletal:  Negative for arthralgias, back pain and gait problem.  Skin:  Positive for wound (sees podiatry). Negative for rash.  Neurological:  Negative for light-headedness and headaches.  Psychiatric/Behavioral:  Negative for agitation and behavioral problems.   All other systems reviewed and are negative.   Per HPI unless  specifically indicated above   Allergies as of 02/14/2024       Reactions   Canagliflozin Other (See Comments)   vaginitis vaginitis   Pioglitazone Other (See Comments)   Edema Edema        Medication List        Accurate as of February 14, 2024 10:26 AM. If you have any questions, ask your nurse or doctor.          Basaglar KwikPen 100 UNIT/ML Inject 30 Units into the skin daily.   Belbuca 300 MCG Film Generic drug: Buprenorphine HCl Take 1 Film by mouth every 12 (twelve) hours.   carbidopa-levodopa 25-100 MG tablet Commonly known as: SINEMET IR Take 1 tablet by mouth in the morning and at bedtime.   DULoxetine 60 MG capsule Commonly known as: CYMBALTA Take 1 capsule (60 mg total) by mouth at bedtime.   fluconazole 150 MG tablet Commonly known as: DIFLUCAN Take 150 mg by mouth daily.   FreeStyle Libre 3 Plus Sensor Misc Change sensor every 15 days.   FreeStyle Libre 3 Reader Devi 1 each by Does not apply route every 14 (fourteen) days.   ibuprofen 800 MG tablet Commonly known as: ADVIL Take 800 mg by mouth every 8 (eight) hours as needed.   Insulin Pen Needle 31G X 5 MM Misc Commonly known as: Global Ease Inject Pen Needles USE FOUR TIMES DAILY   metFORMIN 500 MG tablet Commonly known as: GLUCOPHAGE Take 1 tablet (500 mg total) by mouth 2 (two) times daily with a meal.  NovoLOG FlexPen 100 UNIT/ML FlexPen Generic drug: insulin aspart Inject 3-20 Units into the skin 3 (three) times daily with meals.   OXcarbazepine 150 MG tablet Commonly known as: TRILEPTAL TAKE 1 TABLET BY MOUTH TWICE DAILY   pregabalin 150 MG capsule Commonly known as: LYRICA 1 in am 2 at night   rOPINIRole 1 MG tablet Commonly known as: REQUIP TAKE 1 TABLET BY MOUTH AT BEDTIME   rosuvastatin 5 MG tablet Commonly known as: CRESTOR Take 1 tablet (5 mg total) by mouth at bedtime.   Trulicity 4.5 MG/0.5ML Soaj Generic drug: Dulaglutide Inject 4.5 mg into the skin once a  week.   VITAMIN D PO Take 1,000 Units by mouth daily.         Objective:   BP 113/68   Pulse 85   Ht 5\' 9"  (1.753 m)   Wt 190 lb (86.2 kg)   SpO2 98%   BMI 28.06 kg/m   Wt Readings from Last 3 Encounters:  02/14/24 190 lb (86.2 kg)  11/08/23 189 lb (85.7 kg)  11/01/23 189 lb (85.7 kg)    Physical Exam Vitals and nursing note reviewed.  Constitutional:      General: She is not in acute distress.    Appearance: She is well-developed. She is not diaphoretic.  Eyes:     Conjunctiva/sclera: Conjunctivae normal.  Cardiovascular:     Rate and Rhythm: Normal rate and regular rhythm.     Heart sounds: Normal heart sounds. No murmur heard. Pulmonary:     Effort: Pulmonary effort is normal. No respiratory distress.     Breath sounds: Normal breath sounds. No wheezing.  Skin:    General: Skin is warm and dry.     Findings: No rash.  Neurological:     Mental Status: She is alert and oriented to person, place, and time.     Coordination: Coordination normal.  Psychiatric:        Behavior: Behavior normal.       Assessment & Plan:   Problem List Items Addressed This Visit       Endocrine   Mild nonproliferative diabetic retinopathy of both eyes (HCC)   Relevant Medications   insulin aspart (NOVOLOG FLEXPEN) 100 UNIT/ML FlexPen   Insulin Glargine (BASAGLAR KWIKPEN) 100 UNIT/ML   rosuvastatin (CRESTOR) 5 MG tablet   Diabetic peripheral neuropathy (HCC)   Relevant Medications   insulin aspart (NOVOLOG FLEXPEN) 100 UNIT/ML FlexPen   Insulin Glargine (BASAGLAR KWIKPEN) 100 UNIT/ML   rosuvastatin (CRESTOR) 5 MG tablet   Diabetes mellitus (HCC) - Primary   Relevant Medications   insulin aspart (NOVOLOG FLEXPEN) 100 UNIT/ML FlexPen   Insulin Glargine (BASAGLAR KWIKPEN) 100 UNIT/ML   rosuvastatin (CRESTOR) 5 MG tablet   Other Relevant Orders   CBC with Differential/Platelet   CMP14+EGFR   Lipid panel   Bayer DCA Hb A1c Waived   Microalbumin / creatinine urine ratio      Other   Hyperlipidemia   Relevant Medications   rosuvastatin (CRESTOR) 5 MG tablet   Other Relevant Orders   CBC with Differential/Platelet   CMP14+EGFR   Lipid panel   Bayer DCA Hb A1c Waived   Other Visit Diagnoses       Postmenopausal       Relevant Orders   DG [facility] DEXA     UTI symptoms       Relevant Orders   Urine Culture   Urinalysis, Complete     A1c 6.8 today.  Blood pressure and  everything looks good today.  She says she has a sore on her foot that she is seeing podiatry and other wound care specialist for.  She left a urine today and will watch to see if there is any sign of infection, also left it for the microalbumin.  Follow up plan: Return in about 3 months (around 05/16/2024), or if symptoms worsen or fail to improve, for Diabetes recheck.  Counseling provided for all of the vaccine components Orders Placed This Encounter  Procedures   Urine Culture   DG [facility] DEXA   CBC with Differential/Platelet   CMP14+EGFR   Lipid panel   Bayer DCA Hb A1c Waived   Microalbumin / creatinine urine ratio   Urinalysis, Complete    Arville Care, MD [place] [facility] 02/14/2024, 10:26 AM

## 2024-02-15 LAB — LIPID PANEL
Cholesterol, Total: 170 mg/dL (ref 100–199)
HDL: 48 mg/dL (ref >39)
LDL CALC COMMENT:: 3.5 ratio (ref 0.0–4.4)
LDL Chol Calc (NIH): 93 mg/dL (ref 0–99)
Triglycerides: 167 mg/dL — ABNORMAL HIGH (ref 0–149)
VLDL Cholesterol Cal: 29 mg/dL (ref 5–40)

## 2024-02-15 LAB — CMP14+EGFR
ALT: 13 IU/L (ref 0–32)
AST: 12 IU/L (ref 0–40)
Albumin: 3.8 g/dL — ABNORMAL LOW (ref 3.9–4.9)
Alkaline Phosphatase: 122 IU/L — ABNORMAL HIGH (ref 44–121)
BUN/Creatinine Ratio: 16 (ref 12–28)
BUN: 12 mg/dL (ref 8–27)
Bilirubin Total: 0.5 mg/dL (ref 0.0–1.2)
CO2: 21 mmol/L (ref 20–29)
Calcium: 8.7 mg/dL (ref 8.7–10.3)
Chloride: 103 mmol/L (ref 96–106)
Creatinine, Ser: 0.74 mg/dL (ref 0.57–1.00)
Globulin, Total: 2.4 g/dL (ref 1.5–4.5)
Glucose: 121 mg/dL — ABNORMAL HIGH (ref 70–99)
Potassium: 4.3 mmol/L (ref 3.5–5.2)
Sodium: 140 mmol/L (ref 134–144)
Total Protein: 6.2 g/dL (ref 6.0–8.5)
eGFR: 89 mL/min/{1.73_m2} (ref >59)

## 2024-02-15 LAB — CBC WITH DIFFERENTIAL/PLATELET
Basophils Absolute: 0 10*3/uL (ref 0.0–0.2)
Basos: 0 %
EOS (ABSOLUTE): 0.4 10*3/uL (ref 0.0–0.4)
Eos: 6 %
Hematocrit: 36.3 % (ref 34.0–46.6)
Hemoglobin: 12 g/dL (ref 11.1–15.9)
Immature Grans (Abs): 0 10*3/uL (ref 0.0–0.1)
Immature Granulocytes: 0 %
Lymphocytes Absolute: 2.2 10*3/uL (ref 0.7–3.1)
Lymphs: 30 %
MCH: 28.6 pg (ref 26.6–33.0)
MCHC: 33.1 g/dL (ref 31.5–35.7)
MCV: 86 fL (ref 79–97)
Monocytes Absolute: 0.6 10*3/uL (ref 0.1–0.9)
Monocytes: 8 %
Neutrophils Absolute: 4.1 10*3/uL (ref 1.4–7.0)
Neutrophils: 56 %
Platelets: 304 10*3/uL (ref 150–450)
RBC: 4.2 x10E6/uL (ref 3.77–5.28)
RDW: 12.7 % (ref 11.7–15.4)
WBC: 7.3 10*3/uL (ref 3.4–10.8)

## 2024-02-15 LAB — MICROALBUMIN / CREATININE URINE RATIO
Creatinine, Urine: 240.1 mg/dL
Microalb/Creat Ratio: 6 mg/g{creat} (ref 0–29)
Microalbumin, Urine: 14.3 ug/mL

## 2024-02-16 ENCOUNTER — Encounter: Payer: Self-pay | Admitting: Family Medicine

## 2024-02-16 DIAGNOSIS — G894 Chronic pain syndrome: Secondary | ICD-10-CM | POA: Diagnosis not present

## 2024-02-16 DIAGNOSIS — G2581 Restless legs syndrome: Secondary | ICD-10-CM | POA: Diagnosis not present

## 2024-02-16 DIAGNOSIS — M79672 Pain in left foot: Secondary | ICD-10-CM | POA: Diagnosis not present

## 2024-02-16 DIAGNOSIS — Z5181 Encounter for therapeutic drug level monitoring: Secondary | ICD-10-CM | POA: Diagnosis not present

## 2024-02-16 DIAGNOSIS — Z79899 Other long term (current) drug therapy: Secondary | ICD-10-CM | POA: Diagnosis not present

## 2024-02-16 DIAGNOSIS — E1140 Type 2 diabetes mellitus with diabetic neuropathy, unspecified: Secondary | ICD-10-CM | POA: Diagnosis not present

## 2024-02-16 DIAGNOSIS — M79671 Pain in right foot: Secondary | ICD-10-CM | POA: Diagnosis not present

## 2024-03-06 DIAGNOSIS — L97529 Non-pressure chronic ulcer of other part of left foot with unspecified severity: Secondary | ICD-10-CM | POA: Diagnosis not present

## 2024-03-06 DIAGNOSIS — E11621 Type 2 diabetes mellitus with foot ulcer: Secondary | ICD-10-CM | POA: Diagnosis not present

## 2024-03-06 DIAGNOSIS — L97514 Non-pressure chronic ulcer of other part of right foot with necrosis of bone: Secondary | ICD-10-CM | POA: Diagnosis not present

## 2024-03-06 DIAGNOSIS — L97426 Non-pressure chronic ulcer of left heel and midfoot with bone involvement without evidence of necrosis: Secondary | ICD-10-CM | POA: Diagnosis not present

## 2024-03-06 DIAGNOSIS — E08621 Diabetes mellitus due to underlying condition with foot ulcer: Secondary | ICD-10-CM | POA: Diagnosis not present

## 2024-03-06 DIAGNOSIS — M79671 Pain in right foot: Secondary | ICD-10-CM | POA: Diagnosis not present

## 2024-03-13 DIAGNOSIS — E11621 Type 2 diabetes mellitus with foot ulcer: Secondary | ICD-10-CM | POA: Diagnosis not present

## 2024-03-13 DIAGNOSIS — L97529 Non-pressure chronic ulcer of other part of left foot with unspecified severity: Secondary | ICD-10-CM | POA: Diagnosis not present

## 2024-03-14 ENCOUNTER — Other Ambulatory Visit (HOSPITAL_COMMUNITY): Payer: Self-pay

## 2024-03-18 ENCOUNTER — Other Ambulatory Visit (HOSPITAL_COMMUNITY): Payer: Self-pay

## 2024-03-18 ENCOUNTER — Telehealth: Payer: Self-pay

## 2024-03-18 NOTE — Telephone Encounter (Signed)
 Pharmacy Patient Advocate Encounter   Received notification from CoverMyMeds that prior authorization for Basaglar KwikPen 100UNIT/ML pen-injectors is required/requested.   Insurance verification completed.   The patient is insured through [facility] ADVANTAGE/RX ADVANCE .   Per test claim: PA required; PA submitted to above mentioned insurance via CoverMyMeds Key/confirmation #/EOC [facility] Status is pending

## 2024-03-20 DIAGNOSIS — L97529 Non-pressure chronic ulcer of other part of left foot with unspecified severity: Secondary | ICD-10-CM | POA: Diagnosis not present

## 2024-03-20 DIAGNOSIS — E11621 Type 2 diabetes mellitus with foot ulcer: Secondary | ICD-10-CM | POA: Diagnosis not present

## 2024-03-20 NOTE — Telephone Encounter (Signed)
 Pharmacy Patient Advocate Encounter  Received notification from [facility] ADVANTAGE/RX ADVANCE that Prior Authorization for Basaglar KwikPen 100UNIT/ML pen-injectors has been DENIED.  See denial reason below. No denial letter attached in CMM. Will attach denial letter to Media tab once received.   PA #/Case ID/Reference #: [id number]

## 2024-03-21 DIAGNOSIS — L97514 Non-pressure chronic ulcer of other part of right foot with necrosis of bone: Secondary | ICD-10-CM | POA: Diagnosis not present

## 2024-03-21 DIAGNOSIS — E11621 Type 2 diabetes mellitus with foot ulcer: Secondary | ICD-10-CM | POA: Diagnosis not present

## 2024-03-22 MED ORDER — LANTUS SOLOSTAR 100 UNIT/ML ~~LOC~~ SOPN: 30.0000 [IU] | PEN_INJECTOR | Freq: Every day | SUBCUTANEOUS | 3 refills | Status: AC

## 2024-03-22 NOTE — Addendum Note (Signed)
 Addended by: Dorene Sorrow on: 03/22/2024 04:54 PM   Modules accepted: Orders

## 2024-03-22 NOTE — Telephone Encounter (Signed)
 Can you check and see on alternatives, maybe lantus and maybe semiglee, same dosing.

## 2024-03-22 NOTE — Telephone Encounter (Signed)
 Lantus 30 units daily. Pt made aware and understood. She has no concerns.

## 2024-03-28 DIAGNOSIS — L97529 Non-pressure chronic ulcer of other part of left foot with unspecified severity: Secondary | ICD-10-CM | POA: Diagnosis not present

## 2024-03-28 DIAGNOSIS — E08621 Diabetes mellitus due to underlying condition with foot ulcer: Secondary | ICD-10-CM | POA: Diagnosis not present

## 2024-03-28 DIAGNOSIS — L97514 Non-pressure chronic ulcer of other part of right foot with necrosis of bone: Secondary | ICD-10-CM | POA: Diagnosis not present

## 2024-03-28 DIAGNOSIS — E11621 Type 2 diabetes mellitus with foot ulcer: Secondary | ICD-10-CM | POA: Diagnosis not present

## 2024-03-28 DIAGNOSIS — L97426 Non-pressure chronic ulcer of left heel and midfoot with bone involvement without evidence of necrosis: Secondary | ICD-10-CM | POA: Diagnosis not present

## 2024-04-09 ENCOUNTER — Telehealth: Payer: Self-pay

## 2024-04-09 NOTE — Telephone Encounter (Signed)
 Faxed surgical clearance form, Last OV note, labs to [facility] for pts upcoming surgery. [phone]. Clearance form sent to be scanned.

## 2024-04-10 DIAGNOSIS — E11621 Type 2 diabetes mellitus with foot ulcer: Secondary | ICD-10-CM | POA: Diagnosis not present

## 2024-04-10 DIAGNOSIS — L97426 Non-pressure chronic ulcer of left heel and midfoot with bone involvement without evidence of necrosis: Secondary | ICD-10-CM | POA: Diagnosis not present

## 2024-04-10 DIAGNOSIS — M869 Osteomyelitis, unspecified: Secondary | ICD-10-CM | POA: Diagnosis not present

## 2024-04-17 DIAGNOSIS — M86172 Other acute osteomyelitis, left ankle and foot: Secondary | ICD-10-CM | POA: Diagnosis not present

## 2024-04-17 DIAGNOSIS — E1140 Type 2 diabetes mellitus with diabetic neuropathy, unspecified: Secondary | ICD-10-CM | POA: Diagnosis not present

## 2024-04-17 DIAGNOSIS — A498 Other bacterial infections of unspecified site: Secondary | ICD-10-CM | POA: Diagnosis not present

## 2024-04-17 DIAGNOSIS — E663 Overweight: Secondary | ICD-10-CM | POA: Diagnosis not present

## 2024-04-17 DIAGNOSIS — F331 Major depressive disorder, recurrent, moderate: Secondary | ICD-10-CM | POA: Diagnosis not present

## 2024-04-19 DIAGNOSIS — M86471 Chronic osteomyelitis with draining sinus, right ankle and foot: Secondary | ICD-10-CM | POA: Diagnosis not present

## 2024-04-24 DIAGNOSIS — L97529 Non-pressure chronic ulcer of other part of left foot with unspecified severity: Secondary | ICD-10-CM | POA: Diagnosis not present

## 2024-04-24 DIAGNOSIS — E11621 Type 2 diabetes mellitus with foot ulcer: Secondary | ICD-10-CM | POA: Diagnosis not present

## 2024-04-24 DIAGNOSIS — L97514 Non-pressure chronic ulcer of other part of right foot with necrosis of bone: Secondary | ICD-10-CM | POA: Diagnosis not present

## 2024-04-24 DIAGNOSIS — M86172 Other acute osteomyelitis, left ankle and foot: Secondary | ICD-10-CM | POA: Diagnosis not present

## 2024-04-24 DIAGNOSIS — E08621 Diabetes mellitus due to underlying condition with foot ulcer: Secondary | ICD-10-CM | POA: Diagnosis not present

## 2024-04-24 DIAGNOSIS — L97426 Non-pressure chronic ulcer of left heel and midfoot with bone involvement without evidence of necrosis: Secondary | ICD-10-CM | POA: Diagnosis not present

## 2024-05-01 DIAGNOSIS — L97529 Non-pressure chronic ulcer of other part of left foot with unspecified severity: Secondary | ICD-10-CM | POA: Diagnosis not present

## 2024-05-01 DIAGNOSIS — E11621 Type 2 diabetes mellitus with foot ulcer: Secondary | ICD-10-CM | POA: Diagnosis not present

## 2024-05-08 DIAGNOSIS — E11621 Type 2 diabetes mellitus with foot ulcer: Secondary | ICD-10-CM | POA: Diagnosis not present

## 2024-05-08 DIAGNOSIS — L97426 Non-pressure chronic ulcer of left heel and midfoot with bone involvement without evidence of necrosis: Secondary | ICD-10-CM | POA: Diagnosis not present

## 2024-05-12 HISTORY — PX: FOOT SURGERY: SHX648

## 2024-05-15 DIAGNOSIS — E1140 Type 2 diabetes mellitus with diabetic neuropathy, unspecified: Secondary | ICD-10-CM | POA: Diagnosis not present

## 2024-05-15 DIAGNOSIS — M86471 Chronic osteomyelitis with draining sinus, right ankle and foot: Secondary | ICD-10-CM | POA: Diagnosis not present

## 2024-05-15 DIAGNOSIS — E11621 Type 2 diabetes mellitus with foot ulcer: Secondary | ICD-10-CM | POA: Diagnosis not present

## 2024-05-15 DIAGNOSIS — L97426 Non-pressure chronic ulcer of left heel and midfoot with bone involvement without evidence of necrosis: Secondary | ICD-10-CM | POA: Diagnosis not present

## 2024-05-16 ENCOUNTER — Ambulatory Visit: Admitting: Family Medicine

## 2024-05-16 ENCOUNTER — Encounter: Payer: Self-pay | Admitting: Family Medicine

## 2024-05-16 ENCOUNTER — Other Ambulatory Visit

## 2024-05-16 VITALS — BP 109/67 | HR 87 | Ht 69.0 in | Wt 193.0 lb

## 2024-05-16 DIAGNOSIS — Z7985 Long-term (current) use of injectable non-insulin antidiabetic drugs: Secondary | ICD-10-CM

## 2024-05-16 DIAGNOSIS — Z7984 Long term (current) use of oral hypoglycemic drugs: Secondary | ICD-10-CM

## 2024-05-16 DIAGNOSIS — E785 Hyperlipidemia, unspecified: Secondary | ICD-10-CM

## 2024-05-16 DIAGNOSIS — E113293 Type 2 diabetes mellitus with mild nonproliferative diabetic retinopathy without macular edema, bilateral: Secondary | ICD-10-CM | POA: Diagnosis not present

## 2024-05-16 DIAGNOSIS — E1142 Type 2 diabetes mellitus with diabetic polyneuropathy: Secondary | ICD-10-CM | POA: Diagnosis not present

## 2024-05-16 DIAGNOSIS — Z794 Long term (current) use of insulin: Secondary | ICD-10-CM | POA: Diagnosis not present

## 2024-05-16 DIAGNOSIS — E1069 Type 1 diabetes mellitus with other specified complication: Secondary | ICD-10-CM | POA: Diagnosis not present

## 2024-05-16 LAB — BAYER DCA HB A1C WAIVED: HB A1C (BAYER DCA - WAIVED): 7.1 % — ABNORMAL HIGH (ref 4.8–5.6)

## 2024-05-16 NOTE — Progress Notes (Signed)
 BP 109/67   Pulse 87   Ht 5\' 9"  (1.753 m)   Wt 193 lb (87.5 kg)   SpO2 95%   BMI 28.50 kg/m    Subjective:   Patient ID: Rhonda Thomas, female    DOB: 1956-01-07, 68 y.o.   MRN: [id number]  HPI: Rhonda Thomas is a 68 y.o. female presenting on 05/16/2024 for Medical Management of Chronic Issues and Diabetes   HPI Type 2 diabetes mellitus Patient comes in today for recheck of his diabetes. Patient has been currently taking freestyle libre and NovoLog  and Lantus  and metformin  and Trulicity . Patient is not currently on an ACE inhibitor/ARB. Patient has seen an ophthalmologist this year. Patient still working on the healing fracture of the left foot. The symptom started onset as an adult retinopathy and neuropathy ARE RELATED TO DM   Hyperlipidemia Patient is coming in for recheck of his hyperlipidemia. The patient is currently taking Crestor . They deny any issues with myalgias or history of liver damage from it. They deny any focal numbness or weakness or chest pain.   She still working with her doctor her left foot for the fracture and healing.  Relevant past medical, surgical, family and social history reviewed and updated as indicated. Interim medical history since our last visit reviewed. Allergies and medications reviewed and updated.  Review of Systems  Constitutional:  Negative for chills and fever.  Eyes:  Negative for visual disturbance.  Respiratory:  Negative for chest tightness and shortness of breath.   Cardiovascular:  Negative for chest pain and leg swelling.  Genitourinary:  Negative for difficulty urinating and dysuria.  Musculoskeletal:  Positive for arthralgias and gait problem. Negative for back pain.  Skin:  Negative for rash.  Neurological:  Negative for light-headedness and headaches.  Psychiatric/Behavioral:  Negative for agitation and behavioral problems.   All other systems reviewed and are negative.   Per HPI unless specifically indicated  above   Allergies as of 05/16/2024       Reactions   Canagliflozin  Other (See Comments)   vaginitis vaginitis   Pioglitazone Other (See Comments)   Edema Edema        Medication List        Accurate as of May 16, 2024 10:04 AM. If you have any questions, ask your nurse or doctor.          Belbuca 300 MCG Film Generic drug: Buprenorphine HCl Take 1 Film by mouth every 12 (twelve) hours.   carbidopa -levodopa  25-100 MG tablet Commonly known as: SINEMET  IR Take 1 tablet by mouth in the morning and at bedtime.   DULoxetine  60 MG capsule Commonly known as: CYMBALTA  Take 1 capsule (60 mg total) by mouth at bedtime.   fluconazole  150 MG tablet Commonly known as: DIFLUCAN  Take 150 mg by mouth daily.   FreeStyle Libre 3 Plus Sensor Misc Change sensor every 15 days.   FreeStyle Libre 3 Reader Devi 1 each by Does not apply route every 14 (fourteen) days.   ibuprofen 800 MG tablet Commonly known as: ADVIL Take 800 mg by mouth every 8 (eight) hours as needed.   Insulin  Thomas Needle 31G X 5 MM Misc Commonly known as: Global Ease Inject Thomas Needles USE FOUR TIMES DAILY   Lantus  SoloStar 100 UNIT/ML Solostar Thomas Generic drug: insulin  glargine Inject 30 Units into the skin daily.   metFORMIN  500 MG tablet Commonly known as: GLUCOPHAGE  Take 1 tablet (500 mg total) by mouth 2 (two) times daily  with a meal.   NovoLOG  FlexPen 100 UNIT/ML FlexPen Generic drug: insulin  aspart Inject 3-20 Units into the skin 3 (three) times daily with meals.   OXcarbazepine  150 MG tablet Commonly known as: TRILEPTAL  TAKE 1 TABLET BY MOUTH TWICE DAILY   pregabalin  150 MG capsule Commonly known as: LYRICA  1 in am 2 at night   rOPINIRole  1 MG tablet Commonly known as: REQUIP  TAKE 1 TABLET BY MOUTH AT BEDTIME   rosuvastatin  5 MG tablet Commonly known as: CRESTOR  Take 1 tablet (5 mg total) by mouth at bedtime.   Trulicity  4.5 MG/0.5ML Soaj Generic drug: Dulaglutide  Inject 4.5 mg  into the skin once a week.   VITAMIN D  PO Take 1,000 Units by mouth daily.         Objective:   BP 109/67   Pulse 87   Ht 5\' 9"  (1.753 m)   Wt 193 lb (87.5 kg)   SpO2 95%   BMI 28.50 kg/m   Wt Readings from Last 3 Encounters:  05/16/24 193 lb (87.5 kg)  02/14/24 190 lb (86.2 kg)  11/08/23 189 lb (85.7 kg)    Physical Exam Vitals and nursing note reviewed.  Constitutional:      General: She is not in acute distress.    Appearance: She is well-developed. She is not diaphoretic.  Eyes:     Conjunctiva/sclera: Conjunctivae normal.  Cardiovascular:     Rate and Rhythm: Normal rate and regular rhythm.     Heart sounds: Normal heart sounds. No murmur heard. Pulmonary:     Effort: Pulmonary effort is normal. No respiratory distress.     Breath sounds: Normal breath sounds. No wheezing.  Musculoskeletal:        General: No swelling.  Skin:    General: Skin is warm and dry.     Findings: No rash.  Neurological:     Mental Status: She is alert and oriented to person, place, and time.     Coordination: Coordination normal.  Psychiatric:        Behavior: Behavior normal.       Assessment & Plan:   Problem List Items Addressed This Visit       Endocrine   Mild nonproliferative diabetic retinopathy of both eyes (HCC)   Diabetic peripheral neuropathy (HCC)   Diabetes mellitus (HCC) - Primary   Relevant Orders   Bayer DCA Hb A1c Waived     Other   Hyperlipidemia    A1c 7.1, slightly elevated but just continue to monitor closely.,  Watching out for low blood sugars 2 at night, be cautious with how much NovoLog  she gives herself at night. Follow up plan: Return in about 3 months (around 08/16/2024), or if symptoms worsen or fail to improve, for Diabetes recheck.  Counseling provided for all of the vaccine components Orders Placed This Encounter  Procedures   Bayer DCA Hb A1c Waived    Jolyne Needs, MD [facility] 05/16/2024, 10:04  AM

## 2024-05-22 DIAGNOSIS — E1142 Type 2 diabetes mellitus with diabetic polyneuropathy: Secondary | ICD-10-CM | POA: Diagnosis not present

## 2024-05-22 DIAGNOSIS — L97426 Non-pressure chronic ulcer of left heel and midfoot with bone involvement without evidence of necrosis: Secondary | ICD-10-CM | POA: Diagnosis not present

## 2024-05-22 DIAGNOSIS — L97514 Non-pressure chronic ulcer of other part of right foot with necrosis of bone: Secondary | ICD-10-CM | POA: Diagnosis not present

## 2024-05-22 DIAGNOSIS — M86172 Other acute osteomyelitis, left ankle and foot: Secondary | ICD-10-CM | POA: Diagnosis not present

## 2024-05-22 DIAGNOSIS — E11621 Type 2 diabetes mellitus with foot ulcer: Secondary | ICD-10-CM | POA: Diagnosis not present

## 2024-06-05 DIAGNOSIS — E1142 Type 2 diabetes mellitus with diabetic polyneuropathy: Secondary | ICD-10-CM | POA: Diagnosis not present

## 2024-06-05 DIAGNOSIS — M86172 Other acute osteomyelitis, left ankle and foot: Secondary | ICD-10-CM | POA: Diagnosis not present

## 2024-06-05 DIAGNOSIS — L97426 Non-pressure chronic ulcer of left heel and midfoot with bone involvement without evidence of necrosis: Secondary | ICD-10-CM | POA: Diagnosis not present

## 2024-06-05 DIAGNOSIS — L97514 Non-pressure chronic ulcer of other part of right foot with necrosis of bone: Secondary | ICD-10-CM | POA: Diagnosis not present

## 2024-06-05 DIAGNOSIS — E11621 Type 2 diabetes mellitus with foot ulcer: Secondary | ICD-10-CM | POA: Diagnosis not present

## 2024-06-05 DIAGNOSIS — M869 Osteomyelitis, unspecified: Secondary | ICD-10-CM | POA: Diagnosis not present

## 2024-06-07 DIAGNOSIS — F411 Generalized anxiety disorder: Secondary | ICD-10-CM | POA: Diagnosis not present

## 2024-06-07 DIAGNOSIS — Z89421 Acquired absence of other right toe(s): Secondary | ICD-10-CM | POA: Diagnosis not present

## 2024-06-07 DIAGNOSIS — F331 Major depressive disorder, recurrent, moderate: Secondary | ICD-10-CM | POA: Diagnosis not present

## 2024-06-07 DIAGNOSIS — M86172 Other acute osteomyelitis, left ankle and foot: Secondary | ICD-10-CM | POA: Diagnosis not present

## 2024-06-07 DIAGNOSIS — Z794 Long term (current) use of insulin: Secondary | ICD-10-CM | POA: Diagnosis not present

## 2024-06-07 DIAGNOSIS — Z4789 Encounter for other orthopedic aftercare: Secondary | ICD-10-CM | POA: Diagnosis not present

## 2024-06-07 DIAGNOSIS — E1142 Type 2 diabetes mellitus with diabetic polyneuropathy: Secondary | ICD-10-CM | POA: Diagnosis not present

## 2024-06-07 DIAGNOSIS — Z7984 Long term (current) use of oral hypoglycemic drugs: Secondary | ICD-10-CM | POA: Diagnosis not present

## 2024-06-07 DIAGNOSIS — E1169 Type 2 diabetes mellitus with other specified complication: Secondary | ICD-10-CM | POA: Diagnosis not present

## 2024-06-07 DIAGNOSIS — Z7985 Long-term (current) use of injectable non-insulin antidiabetic drugs: Secondary | ICD-10-CM | POA: Diagnosis not present

## 2024-06-07 DIAGNOSIS — E538 Deficiency of other specified B group vitamins: Secondary | ICD-10-CM | POA: Diagnosis not present

## 2024-06-07 DIAGNOSIS — E113293 Type 2 diabetes mellitus with mild nonproliferative diabetic retinopathy without macular edema, bilateral: Secondary | ICD-10-CM | POA: Diagnosis not present

## 2024-06-19 DIAGNOSIS — M86471 Chronic osteomyelitis with draining sinus, right ankle and foot: Secondary | ICD-10-CM | POA: Diagnosis not present

## 2024-06-19 DIAGNOSIS — E11621 Type 2 diabetes mellitus with foot ulcer: Secondary | ICD-10-CM | POA: Diagnosis not present

## 2024-06-19 DIAGNOSIS — L97426 Non-pressure chronic ulcer of left heel and midfoot with bone involvement without evidence of necrosis: Secondary | ICD-10-CM | POA: Diagnosis not present

## 2024-06-19 DIAGNOSIS — Z89421 Acquired absence of other right toe(s): Secondary | ICD-10-CM | POA: Diagnosis not present

## 2024-06-19 DIAGNOSIS — E1142 Type 2 diabetes mellitus with diabetic polyneuropathy: Secondary | ICD-10-CM | POA: Diagnosis not present

## 2024-06-19 DIAGNOSIS — M86172 Other acute osteomyelitis, left ankle and foot: Secondary | ICD-10-CM | POA: Diagnosis not present

## 2024-07-04 ENCOUNTER — Encounter: Payer: BC Managed Care – PPO | Attending: Psychology | Admitting: Psychology

## 2024-07-04 ENCOUNTER — Encounter: Payer: Self-pay | Admitting: Psychology

## 2024-07-04 DIAGNOSIS — F331 Major depressive disorder, recurrent, moderate: Secondary | ICD-10-CM | POA: Diagnosis not present

## 2024-07-04 DIAGNOSIS — F411 Generalized anxiety disorder: Secondary | ICD-10-CM | POA: Insufficient documentation

## 2024-07-04 DIAGNOSIS — E1142 Type 2 diabetes mellitus with diabetic polyneuropathy: Secondary | ICD-10-CM | POA: Insufficient documentation

## 2024-07-04 DIAGNOSIS — Z794 Long term (current) use of insulin: Secondary | ICD-10-CM | POA: Diagnosis not present

## 2024-07-04 DIAGNOSIS — F5101 Primary insomnia: Secondary | ICD-10-CM | POA: Insufficient documentation

## 2024-07-04 NOTE — Progress Notes (Signed)
 Patient ID: Rhonda Thomas, female   DOB: January 13, 1956, 68 y.o.   MRN: [id number] [name]   DOB: [birth year]  MR Number: [id number]  Location: [facility]  PHYSICAL MEDICINE AND REHABILITATION [address] [place] [address] Dept: [phone]  Start: 8 AM End: 9 AM  Today's visit was an in person visit that lasted for 1 hour and was conducted in my outpatient clinic office with the patient myself present.  Today's visit was assisted the use of a digital scribe for a note taking and organization of clinical note.  The use of this system was reviewed with the patient before hand and the patient provided willingness and agreement to proceed with the aid of a digital provide.  The results from the digital scribe output was reviewed for accuracy and portions of it was added to the current.  Provider/Observer:     Norleen JONELLE Asa PsyD  Chief Complaint:      Chief Complaint  Patient presents with   Anxiety   ADHD   Pain   Stress     Reason For Service:     I have worked with the patient for several years now. She was initially referred here for difficulty coping with the situation involving her children. Her youngest son is at severe difficulties over the years with regard to mood disorder, substance abuse and behavioral problems. Now there are major stressors associated with him again the patient has been essentially overwhelmed and unsure about what to do about. She reports that this has created a lot of stress both at home and at work. On top of that, her diabetes has gotten to the point that she had hammertoe amputated because of that. She has neuropathy as a result of diabetes as well and has had to stay out of her foot for 8 weeks.  The psychosocial stressors are having significant negative impact upon her severe diabetes with neuropathy and loss of toe.  The patient continues to have  recurrent issues with residual effects of her type 2 diabetes with significant medical complications.  The reason for service has been reviewed and remains applicable for the current visit.  The patient reports that she is still dealing with anxiety and depressive type symptomatology and continues to take her psychotropic medications including Cymbalta , Trileptal  and also taking Lyrica  for peripheral neuropathy.  Reason For Service:                         Presents for follow-up regarding ongoing medical issues, specifically a persistent foot infection (osteomyelitis) and psychosocial stressors. Reports recurrent issues with a wound vac, including loss of suction, requiring frequent resets and multiple redressings by the home health nurse. Follow-up with the treating physician has been delayed due to the clinic's rotation schedule, leaving uncertainty about the wound's healing status. States has been wearing a boot since the last missed appointment due to a severe infection that required an emergency room visit, presenting with a mushy and discolored foot. Reports they were informed by the physician that amputation would be a possible next step if the current treatment fails. Has Type 1 diabetes.  A significant portion of the session was dedicated to psychoeducation regarding the potential for a below-the-knee amputation (BKA). This included discussing the common fears associated with amputation, the typical post-operative course, and the process of being fitted for and adapting to a prosthetic  limb. Information was provided on phantom limb syndrome, the timeline for healing, and prosthetic fitting (typically 3-4 months post-op). The high functionality of modern prosthetics was explained, emphasizing the brain's ability (proprioception) to adapt and integrate the prosthetic, leading to good balance and mobility. It was noted that many individuals report improved function and wish they had undergone the  procedure sooner after enduring prolonged periods of pain, infection risk, and limited mobility. The potential for a safer, more functional life with fewer medical appointments and reduced risk of systemic infection (sepsis) post-amputation was discussed.  Current Psychosocial Factors:       Reports significant distress due to being ghosted by their son, [name], and being cut off from their grandchild. Believes the conflict began after buying a puppy for the granddaughter, which the son perceived negatively. Expresses feeling heartbroken and confused, suspecting their son views them as narcissistic based on misinformation from social media and potentially a counselor he is seeing online. Reports their son's wife has significant anxiety, making it difficult for her to cope with new situations, such as having a kitten in the house. This has led to the grandchild being raised in a very controlled environment with no sleepovers or other sustained interactions with other children and experiences. The current family dynamic is a major source of stress, and there is concern about the influence of social media influencers on their son's perceptions and beliefs. The individual feels this is harming them significantly.  Content of Session:                          Reviewed the ongoing medical situation with the foot and the associated anxieties. Provided extensive psychoeducation and supportive counseling regarding the potential for amputation, framing it as a viable option that could lead to improved quality of life, increased safety, and greater independence. Addressed the family conflict with the son, [name]. Explored the potential role of social media algorithms and online influencers in shaping their son's negative perceptions. Discussed the psychological mechanisms at play, such as projection, where the son's own anxieties about his life and family may be being directed externally. Discussed the concept that  anger is a secondary emotion, often stemming from underlying fear. Addressed catastrophizing thoughts regarding the permanence of the current family situation.  Effectiveness of Interventions:       Engaged well with the discussion, appearing receptive to the information provided about amputation and the potential for a positive outcome. Expressed understanding of the rationale. Appeared to find the discussion regarding the family conflict validating and helpful in reframing the situation.  Goals Addressed Today:                 Addressed anxieties related to medical status, specifically the potential for foot amputation. Continued to work on managing PTSD symptoms by addressing fears and providing psychoeducation. Discussed the impact of the current family conflict on mental health, particularly feelings of rejection and misunderstanding. Addressed catastrophic thinking by reframing the current situation with the son as likely temporary rather than permanent, encouraging the addition of right now to negative thoughts.  Participation Level:   Active  Participation Quality:  Appropriate      Behavioral Observation:  Well Groomed, Alert, and Appropriate.   Last Reviewed:   07/04/2024  Impression/Diagnosis:   The patient has a long history of attention deficit disorder but due to Maj. psychosocial stressors she also developed clinical depression and anxiety. I think that her attentional problems were  valid prior to the development of these issues that existed her entire life. However, she is struggling with social challenges and recurrent trauma with regard primarily to her son and to her daughter to a lesser degree in years past.     Norleen JONELLE Asa, PsyD 07/04/2024

## 2024-07-11 ENCOUNTER — Other Ambulatory Visit: Payer: Self-pay | Admitting: *Deleted

## 2024-07-11 DIAGNOSIS — E1069 Type 1 diabetes mellitus with other specified complication: Secondary | ICD-10-CM

## 2024-07-11 DIAGNOSIS — E1049 Type 1 diabetes mellitus with other diabetic neurological complication: Secondary | ICD-10-CM

## 2024-07-11 MED ORDER — NOVOLOG FLEXPEN 100 UNIT/ML ~~LOC~~ SOPN: 3.0000 [IU] | PEN_INJECTOR | Freq: Three times a day (TID) | SUBCUTANEOUS | 0 refills | Status: DC

## 2024-07-17 DIAGNOSIS — M86172 Other acute osteomyelitis, left ankle and foot: Secondary | ICD-10-CM | POA: Diagnosis not present

## 2024-07-17 DIAGNOSIS — E1142 Type 2 diabetes mellitus with diabetic polyneuropathy: Secondary | ICD-10-CM | POA: Diagnosis not present

## 2024-07-17 DIAGNOSIS — E11621 Type 2 diabetes mellitus with foot ulcer: Secondary | ICD-10-CM | POA: Diagnosis not present

## 2024-07-17 DIAGNOSIS — L97426 Non-pressure chronic ulcer of left heel and midfoot with bone involvement without evidence of necrosis: Secondary | ICD-10-CM | POA: Diagnosis not present

## 2024-07-17 DIAGNOSIS — Z89421 Acquired absence of other right toe(s): Secondary | ICD-10-CM | POA: Diagnosis not present

## 2024-08-06 ENCOUNTER — Encounter: Payer: BC Managed Care – PPO | Attending: Psychology | Admitting: Psychology

## 2024-08-06 ENCOUNTER — Encounter: Payer: Self-pay | Admitting: Psychology

## 2024-08-06 DIAGNOSIS — F331 Major depressive disorder, recurrent, moderate: Secondary | ICD-10-CM | POA: Diagnosis not present

## 2024-08-06 DIAGNOSIS — F5101 Primary insomnia: Secondary | ICD-10-CM | POA: Insufficient documentation

## 2024-08-06 DIAGNOSIS — Z794 Long term (current) use of insulin: Secondary | ICD-10-CM

## 2024-08-06 DIAGNOSIS — F411 Generalized anxiety disorder: Secondary | ICD-10-CM | POA: Insufficient documentation

## 2024-08-06 DIAGNOSIS — E1142 Type 2 diabetes mellitus with diabetic polyneuropathy: Secondary | ICD-10-CM | POA: Diagnosis not present

## 2024-08-06 NOTE — Progress Notes (Signed)
 Patient ID: Rhonda Thomas, female   DOB: 03/26/1956, 68 y.o.   MRN: [id number] [name]   DOB: 11-Jul-1956  MR Number: [id number]  Location: [facility] [address] [place] [address] Dept: [phone]  Start: 9 AM End: 10 AM  Today's visit was an in person visit that lasted for 1 hour and was conducted in my outpatient clinic office with the patient myself present.  Today's visit was assisted the use of a digital scribe for a note taking and organization of clinical note.  The use of this system was reviewed with the patient before hand during previous visits and the patient provided willingness and agreement to proceed with the aid of a digital provide.  The results from the digital scribe output was reviewed for accuracy and portions of it was added to the current.  Provider/Observer:     Rhonda JONELLE Asa PsyD  Chief Complaint:      Chief Complaint  Patient presents with   Anxiety   Depression   ADHD   Pain   Stress     Reason For Service:     I have worked with the patient for several years now. She was initially referred here for difficulty coping with the situation involving her children. Her youngest son is at severe difficulties over the years with regard to mood disorder, substance abuse and behavioral problems. Now there are major stressors associated with him again the patient has been essentially overwhelmed and unsure about what to do about. She reports that this has created a lot of stress both at home and at work. On top of that, her diabetes has gotten to the point that she had hammertoe amputated because of that. She has neuropathy as a result of diabetes as well and has had to stay out of her foot for 8 weeks.  The psychosocial stressors are having significant negative impact upon her severe diabetes with neuropathy and loss of toe.   The patient continues to have recurrent issues with residual effects of her type 2 diabetes with significant medical complications.  The reason for service has been reviewed and remains applicable for the current visit.  The patient reports that she is still dealing with anxiety and depressive type symptomatology and continues to take her psychotropic medications including Cymbalta , Trileptal  and also taking Lyrica  for peripheral neuropathy.  During today's visit, the patient continues with significant family stressors and medical issues related to a non-healing wound on the heel. Reports the wound cracked open and progressed despite seeing a physician, leading to a bone infection. Underwent a procedure to shave down the bone. This has been an ongoing issue since January. Reports the sharp bone was previously protruding, causing holes in shoes and socks.  Current Psychosocial Factors:       Reports significant psychosocial distress related to a family conflict with son, [name]. The conflict has resulted in estrangement from the son, daughter-in-law, and granddaughter. Expresses feelings of sadness, confusion, and loss over the relationship. The son has accused the individual of threatening his family by allowing a felon to visit the home. The son has reportedly ceased all contact and refuses to disclose his new address. Husband is unable to drive following a medical event, increasing the individual's stress and responsibilities. He is reportedly 30 days away from a 90-day driving restriction period and is not experiencing any further issues. Husband  also experiences sleep disturbances related to the family conflict. Additional stressor is the impending visit of a cousin due to a critically ill uncle (stage five illness), creating pressure to clean the house while managing the heel wound.  Content of Session:                          Discussion centered on the ongoing family conflict with son, [name].  Explored the individual's desire to understand the cause of the conflict and attempts to reconcile. Psychoeducation was provided on the ineffectiveness of demanding an explanation from someone whose position is not based on logic. Explained how arguing a point, even if illogical, can strengthen a person's belief in it. Advised against confrontational approaches, such as visiting his church or demanding to know why, as this is likely to increase his defensiveness. The recommended approach is to communicate feelings simply (e.g., I'm sad, I miss seeing my granddaughter) without demanding justification or proposing solutions. Discussion also covered the psychological impact of social media and modern technology, framing it as a source of fear, anger, and social disconnection, which may be influencing the son's behavior. Session also addressed anxiety related to the foot wound and potential for amputation. Discussed the medical reality of the situation, framing potential future amputation as a life-preserving measure if the foot's vascular health declines to a point where it threatens overall health. Reviewed the process and typical outcomes of below-the-knee amputations, including the high rate of functional recovery.  Effectiveness of Interventions:        Engaged well with psychoeducation regarding family dynamics and communication strategies. Appeared to understand the rationale for avoiding a confrontational approach with her son, though continues to express significant emotional distress over the estrangement. Expressed fear and anxiety about the foot wound and the possibility of amputation, but was receptive to the discussion about it being a potentially positive long-term outcome for preserving life and function.  Goals Addressed Today:                 Addressed management of family conflict, specifically with the son. Explored strategies to avoid escalating the situation and fostering defensiveness.  Discussed management of health-related anxiety, specifically concerning the foot wound. Provided psychoeducation on the long-term prognosis and potential for amputation, reframing it as a necessary step for life preservation and future functionality. Advised on seeking a second opinion from Dr. Harden for the foot wound, not as a replacement of the current provider, but as a consultation to gain more information and make an informed decision.  Participation Level:   Active  Participation Quality:  Appropriate      Behavioral Observation:  Well Groomed, Alert, and Appropriate.   Last Reviewed:   08/06/2024  Impression/Diagnosis:   The patient has a long history of attention deficit disorder but due to Maj. psychosocial stressors she also developed clinical depression and anxiety. I think that her attentional problems were valid prior to the development of these issues that existed her entire life. However, she is struggling with social challenges and recurrent trauma with regard primarily to her son and to her daughter to a lesser degree in years past.     Rhonda JONELLE Asa, PsyD 08/06/2024

## 2024-08-21 DIAGNOSIS — G2581 Restless legs syndrome: Secondary | ICD-10-CM | POA: Diagnosis not present

## 2024-08-21 DIAGNOSIS — Z79899 Other long term (current) drug therapy: Secondary | ICD-10-CM | POA: Diagnosis not present

## 2024-08-21 DIAGNOSIS — E1140 Type 2 diabetes mellitus with diabetic neuropathy, unspecified: Secondary | ICD-10-CM | POA: Diagnosis not present

## 2024-08-21 DIAGNOSIS — Z5181 Encounter for therapeutic drug level monitoring: Secondary | ICD-10-CM | POA: Diagnosis not present

## 2024-08-21 DIAGNOSIS — G894 Chronic pain syndrome: Secondary | ICD-10-CM | POA: Diagnosis not present

## 2024-08-21 DIAGNOSIS — G5793 Unspecified mononeuropathy of bilateral lower limbs: Secondary | ICD-10-CM

## 2024-08-21 HISTORY — DX: Restless legs syndrome: G25.81

## 2024-08-21 HISTORY — DX: Unspecified mononeuropathy of bilateral lower limbs: G57.93

## 2024-08-22 ENCOUNTER — Other Ambulatory Visit (HOSPITAL_COMMUNITY): Payer: Self-pay

## 2024-08-22 ENCOUNTER — Ambulatory Visit: Admitting: Family Medicine

## 2024-08-22 ENCOUNTER — Telehealth: Payer: Self-pay | Admitting: Pharmacist

## 2024-08-22 ENCOUNTER — Encounter: Payer: Self-pay | Admitting: Family Medicine

## 2024-08-22 ENCOUNTER — Other Ambulatory Visit (INDEPENDENT_AMBULATORY_CARE_PROVIDER_SITE_OTHER): Payer: Self-pay | Admitting: Pharmacist

## 2024-08-22 ENCOUNTER — Telehealth: Payer: Self-pay

## 2024-08-22 VITALS — BP 117/74 | HR 82 | Ht 69.0 in | Wt 193.0 lb

## 2024-08-22 DIAGNOSIS — E1069 Type 1 diabetes mellitus with other specified complication: Secondary | ICD-10-CM

## 2024-08-22 DIAGNOSIS — E113293 Type 2 diabetes mellitus with mild nonproliferative diabetic retinopathy without macular edema, bilateral: Secondary | ICD-10-CM | POA: Diagnosis not present

## 2024-08-22 DIAGNOSIS — L97521 Non-pressure chronic ulcer of other part of left foot limited to breakdown of skin: Secondary | ICD-10-CM

## 2024-08-22 DIAGNOSIS — Z794 Long term (current) use of insulin: Secondary | ICD-10-CM

## 2024-08-22 DIAGNOSIS — E1049 Type 1 diabetes mellitus with other diabetic neurological complication: Secondary | ICD-10-CM

## 2024-08-22 DIAGNOSIS — E119 Type 2 diabetes mellitus without complications: Secondary | ICD-10-CM

## 2024-08-22 DIAGNOSIS — Z23 Encounter for immunization: Secondary | ICD-10-CM | POA: Diagnosis not present

## 2024-08-22 DIAGNOSIS — E785 Hyperlipidemia, unspecified: Secondary | ICD-10-CM | POA: Diagnosis not present

## 2024-08-22 DIAGNOSIS — Z7985 Long-term (current) use of injectable non-insulin antidiabetic drugs: Secondary | ICD-10-CM

## 2024-08-22 DIAGNOSIS — E1142 Type 2 diabetes mellitus with diabetic polyneuropathy: Secondary | ICD-10-CM

## 2024-08-22 DIAGNOSIS — E11621 Type 2 diabetes mellitus with foot ulcer: Secondary | ICD-10-CM | POA: Diagnosis not present

## 2024-08-22 LAB — CMP14+EGFR
ALT: 16 IU/L (ref 0–32)
AST: 15 IU/L (ref 0–40)
Albumin: 3.4 g/dL — ABNORMAL LOW (ref 3.9–4.9)
Alkaline Phosphatase: 136 IU/L — ABNORMAL HIGH (ref 44–121)
BUN/Creatinine Ratio: 17 (ref 12–28)
BUN: 13 mg/dL (ref 8–27)
Bilirubin Total: 0.5 mg/dL (ref 0.0–1.2)
CO2: 26 mmol/L (ref 20–29)
Calcium: 8.8 mg/dL (ref 8.7–10.3)
Chloride: 102 mmol/L (ref 96–106)
Creatinine, Ser: 0.75 mg/dL (ref 0.57–1.00)
Globulin, Total: 2.3 g/dL (ref 1.5–4.5)
Glucose: 145 mg/dL — ABNORMAL HIGH (ref 70–99)
Potassium: 4.2 mmol/L (ref 3.5–5.2)
Sodium: 140 mmol/L (ref 134–144)
Total Protein: 5.7 g/dL — ABNORMAL LOW (ref 6.0–8.5)
eGFR: 87 mL/min/1.73 (ref >59)

## 2024-08-22 LAB — LIPID PANEL
Chol/HDL Ratio: 2.1 ratio (ref 0.0–4.4)
Cholesterol, Total: 113 mg/dL (ref 100–199)
HDL: 53 mg/dL (ref >39)
LDL Chol Calc (NIH): 42 mg/dL (ref 0–99)
Triglycerides: 96 mg/dL (ref 0–149)
VLDL Cholesterol Cal: 18 mg/dL (ref 5–40)

## 2024-08-22 LAB — CBC WITH DIFFERENTIAL/PLATELET
Basophils Absolute: 0 x10E3/uL (ref 0.0–0.2)
Basos: 1 %
EOS (ABSOLUTE): 0.4 x10E3/uL (ref 0.0–0.4)
Eos: 7 %
Hematocrit: 35.5 % (ref 34.0–46.6)
Hemoglobin: 10.9 g/dL — ABNORMAL LOW (ref 11.1–15.9)
Immature Grans (Abs): 0 x10E3/uL (ref 0.0–0.1)
Immature Granulocytes: 0 %
Lymphocytes Absolute: 2.2 x10E3/uL (ref 0.7–3.1)
Lymphs: 35 %
MCH: 27.2 pg (ref 26.6–33.0)
MCHC: 30.7 g/dL — ABNORMAL LOW (ref 31.5–35.7)
MCV: 89 fL (ref 79–97)
Monocytes Absolute: 0.4 x10E3/uL (ref 0.1–0.9)
Monocytes: 7 %
Neutrophils Absolute: 3.1 x10E3/uL (ref 1.4–7.0)
Neutrophils: 50 %
Platelets: 300 x10E3/uL (ref 150–450)
RBC: 4.01 x10E6/uL (ref 3.77–5.28)
RDW: 13.3 % (ref 11.7–15.4)
WBC: 6.2 x10E3/uL (ref 3.4–10.8)

## 2024-08-22 LAB — BAYER DCA HB A1C WAIVED: HB A1C (BAYER DCA - WAIVED): 8.5 % — ABNORMAL HIGH (ref 4.8–5.6)

## 2024-08-22 MED ORDER — NOVOLOG FLEXPEN 100 UNIT/ML ~~LOC~~ SOPN: 3.0000 [IU] | PEN_INJECTOR | Freq: Three times a day (TID) | SUBCUTANEOUS | 3 refills | Status: AC

## 2024-08-22 MED ORDER — METFORMIN HCL 500 MG PO TABS: 500.0000 mg | ORAL_TABLET | Freq: Two times a day (BID) | ORAL | 3 refills | Status: DC

## 2024-08-22 MED ORDER — TRULICITY 4.5 MG/0.5ML ~~LOC~~ SOAJ: 4.5000 mg | SUBCUTANEOUS | 3 refills | Status: DC

## 2024-08-22 MED ORDER — DULOXETINE HCL 60 MG PO CPEP: 60.0000 mg | ORAL_CAPSULE | Freq: Every day | ORAL | 3 refills | Status: DC

## 2024-08-22 NOTE — Progress Notes (Signed)
 Pharmacy Medication Assistance Program Note    11/08/2024  Patient ID: Rhonda Thomas, female   DOB: 02/01/1956, 68 y.o.   MRN: [id number]     08/22/2024  Outreach Medication One  Manufacturer Medication One Novo Nordisk  Nordisk Drugs Tresiba  Dose of Tresiba U100  Type of Radiographer, Therapeutic Assistance  Date Application Submitted to Manufacturer 08/26/2024  Method Application Sent to Manufacturer Fax  Patient Assistance Determination Approved  Approval End Date 12/11/2024        11/08/2024  Patient ID: [name], female  DOB: 1956/11/22, 68 y.o.  MRN:  [id number]     08/22/2024  Outreach Medication Two  Manufacturer Medication Two Novo Nordisk  Nordisk Drugs Novolog   Dose of Novolog  Flexpen  Type of Journalist, Newspaper to [employer] Fax  Date Application Submitted to Manufacturer 08/26/2024  Patient Assistance Determination Approved     NEW - APPROVED UNTIL 12/11/24

## 2024-08-22 NOTE — Progress Notes (Signed)
 BP 117/74   Pulse 82   Ht 5' 9 (1.753 m)   Wt 193 lb (87.5 kg)   SpO2 95%   BMI 28.50 kg/m    Subjective:   Patient ID: Rhonda Thomas, female    DOB: 1956/03/01, 68 y.o.   MRN: [id number]  HPI: [name] is a 68 y.o. female presenting on 08/22/2024 for Medical Management of Chronic Issues and Diabetes   Discussed the use of AI scribe software for clinical note transcription with the patient, who gave verbal consent to proceed.  History of Present Illness   Aimee Heldman is a 68 year old female with diabetes who presents for a follow-up on her diabetes management.  She experiences fluctuating blood sugar levels, with spikes particularly in the evening and at lunchtime. She takes 8 units of insulin  with meals, but her blood sugar sometimes remains elevated an hour after eating. Her last A1c was 8.5; previously, it was 7.1. She has not been consistent with her Trulicity  due to concurrent antibiotic use, which causes gastrointestinal discomfort. She is currently on Lantus  30 units, Novolog  8 to 10 units with meals, and metformin .  She has been experiencing issues with her feet, including a new sore that started like a blister and now resembles a hole about two millimeters deep. She has a history of foot problems, including previous toe amputations, and is under wound care. She has not yet shown the new sore to her wound care team. Her current orthopedic shoe feels too small, causing swelling and discomfort.  She takes Lyrica  and Cymbalta  for neuropathy, which she reports are helping.       Relevant past medical, surgical, family and social history reviewed and updated as indicated. Interim medical history since our last visit reviewed. Allergies and medications reviewed and updated.  Review of Systems  Constitutional:  Negative for chills and fever.  Eyes:  Negative for redness and visual disturbance.  Respiratory:  Negative for chest tightness and shortness  of breath.   Cardiovascular:  Negative for chest pain and leg swelling.  Genitourinary:  Negative for difficulty urinating and dysuria.  Musculoskeletal:  Negative for back pain and gait problem.  Skin:  Positive for wound. Negative for color change and rash.  Neurological:  Negative for light-headedness and headaches.  Psychiatric/Behavioral:  Negative for agitation and behavioral problems.   All other systems reviewed and are negative.   Per HPI unless specifically indicated above   Allergies as of 08/22/2024       Reactions   Canagliflozin  Other (See Comments)   vaginitis vaginitis   Pioglitazone Other (See Comments)   Edema Edema        Medication List        Accurate as of August 22, 2024 10:17 AM. If you have any questions, ask your nurse or doctor.          Belbuca 300 MCG Film Generic drug: Buprenorphine HCl Take 1 Film by mouth every 12 (twelve) hours.   carbidopa -levodopa  25-100 MG tablet Commonly known as: SINEMET  IR Take 1 tablet by mouth in the morning and at bedtime.   doxycycline  100 MG tablet Commonly known as: VIBRA -TABS Take 100 mg by mouth 2 (two) times daily.   DULoxetine  60 MG capsule Commonly known as: CYMBALTA  Take 1 capsule (60 mg total) by mouth at bedtime.   fluconazole  150 MG tablet Commonly known as: DIFLUCAN  Take 150 mg by mouth daily.   FreeStyle Libre 3 Plus Sensor Misc  Change sensor every 15 days.   FreeStyle Libre 3 Reader Devi 1 each by Does not apply route every 14 (fourteen) days.   ibuprofen 800 MG tablet Commonly known as: ADVIL Take 800 mg by mouth every 8 (eight) hours as needed.   Insulin  Pen Needle 31G X 5 MM Misc Commonly known as: Global Ease Inject Pen Needles USE FOUR TIMES DAILY   Lantus  SoloStar 100 UNIT/ML Solostar Pen Generic drug: insulin  glargine Inject 30 Units into the skin daily.   metFORMIN  500 MG tablet Commonly known as: GLUCOPHAGE  Take 1 tablet (500 mg total) by mouth 2 (two) times  daily with a meal.   NovoLOG  FlexPen 100 UNIT/ML FlexPen Generic drug: insulin  aspart Inject 3-20 Units into the skin 3 (three) times daily with meals.   OXcarbazepine  150 MG tablet Commonly known as: TRILEPTAL  TAKE 1 TABLET BY MOUTH TWICE DAILY   pregabalin  150 MG capsule Commonly known as: LYRICA  1 in am 2 at night   rOPINIRole  1 MG tablet Commonly known as: REQUIP  TAKE 1 TABLET BY MOUTH AT BEDTIME   rosuvastatin  5 MG tablet Commonly known as: CRESTOR  Take 1 tablet (5 mg total) by mouth at bedtime.   Trulicity  4.5 MG/0.5ML Soaj Generic drug: Dulaglutide  Inject 4.5 mg into the skin once a week.   VITAMIN D  PO Take 1,000 Units by mouth daily.         Objective:   BP 117/74   Pulse 82   Ht 5' 9 (1.753 m)   Wt 193 lb (87.5 kg)   SpO2 95%   BMI 28.50 kg/m   Wt Readings from Last 3 Encounters:  08/22/24 193 lb (87.5 kg)  05/16/24 193 lb (87.5 kg)  02/14/24 190 lb (86.2 kg)    Physical Exam Physical Exam   VITALS: BP- 117/74 CHEST: Lungs clear to auscultation bilaterally. CARDIOVASCULAR: Heart regular rate and rhythm, no murmurs.         Assessment & Plan:   Problem List Items Addressed This Visit       Endocrine   Mild nonproliferative diabetic retinopathy of both eyes (HCC)   Relevant Medications   insulin  aspart (NOVOLOG  FLEXPEN) 100 UNIT/ML FlexPen   metFORMIN  (GLUCOPHAGE ) 500 MG tablet   TRULICITY  4.5 MG/0.5ML SOAJ   Diabetic peripheral neuropathy (HCC)   Relevant Medications   insulin  aspart (NOVOLOG  FLEXPEN) 100 UNIT/ML FlexPen   DULoxetine  (CYMBALTA ) 60 MG capsule   metFORMIN  (GLUCOPHAGE ) 500 MG tablet   TRULICITY  4.5 MG/0.5ML SOAJ   Diabetes mellitus (HCC) - Primary   Relevant Medications   insulin  aspart (NOVOLOG  FLEXPEN) 100 UNIT/ML FlexPen   metFORMIN  (GLUCOPHAGE ) 500 MG tablet   TRULICITY  4.5 MG/0.5ML SOAJ   Other Relevant Orders   Bayer DCA Hb A1c Waived   CBC with Differential/Platelet   CMP14+EGFR   Lipid panel     Other    Hyperlipidemia   Relevant Orders   Bayer DCA Hb A1c Waived   CBC with Differential/Platelet   CMP14+EGFR   Lipid panel   Other Visit Diagnoses       Skin ulcer of left foot, limited to breakdown of skin (HCC)       Relevant Medications   DULoxetine  (CYMBALTA ) 60 MG capsule          Type 2 diabetes mellitus with diabetic polyneuropathy and chronic foot ulcer Suboptimal glycemic control with A1c increased to 8.5%. Blood glucose spikes likely due to insulin  timing and late dinner. Inconsistent Trulicity  use due to gastrointestinal discomfort. Chronic foot  ulcer with new sore, not infected but at risk. Neuropathy managed with Lyrica  and Cymbalta , providing some relief. - Administer Novolog  8-10 units 10-15 minutes before meals, adjust dose if postprandial glucose exceeds 200 mg/dL. - Resume Trulicity  post-antibiotics to improve glycemic control. - Continue Lantus  30 units daily and metformin  as prescribed. - Contact wound care specialist for new foot sore and consider earlier appointment. - Begin dressing new foot sore and relieve pressure to prevent infection. - Monitor for signs of infection and report if symptoms develop. - Discuss with orthopedic team about obtaining a larger shoe to accommodate swelling.      Follow up plan: Return in about 3 months (around 11/21/2024), or if symptoms worsen or fail to improve, for Diabetes recheck.  Counseling provided for all of the vaccine components Orders Placed This Encounter  Procedures   Bayer DCA Hb A1c Waived   CBC with Differential/Platelet   CMP14+EGFR   Lipid panel    Fonda Levins, MD [facility] 08/22/2024, 10:17 AM

## 2024-08-22 NOTE — Progress Notes (Signed)
 08/22/2024 Name: Rhonda Thomas MRN: [id number] DOB: 06/22/1956  Chief Complaint  Patient presents with   Diabetes    Legaci Rhonda Thomas is a 68 y.o. year old female who was referred for medication management by their primary care provider, Dettinger, Fonda LABOR, MD. They presented for a face to face visit today.   They were referred to the pharmacist by their PCP for assistance in managing diabetes, medication access, and complex medication management    Subjective:  Patient reports she has not been taking her Trulicity  lately due to GI upset from Trulicity  and concurrent antibiotic.  She has noticed her sugars have increased without the Trulicity  on board  Care Team: Primary Care Provider: Dettinger, Fonda LABOR, MD    Medication Access/Adherence  Current Pharmacy:  [facility] - [place], [place] - [address] [facility] [address] [place] [address] Phone: [phone] Fax: [phone]  [facility] - [place], [place] - [address] [place] [address] Phone: [phone] Fax: [phone]   Patient reports affordability concerns with their medications: Yes  Patient reports access/transportation concerns to their pharmacy: No  Patient reports adherence concerns with their medications:  Yes  GI concerns with trulicity    Diabetes:  Current medications: Lantus  30 units daily, novolog  scale, trulicity  4.5mg  , metformin   Medications tried in the past: invokana  (yeast), glimepiride , various brand insulins per insurance preference, sitagliptin ; pioglitazone (allergy)  Glucose readings: [id number]  Patient denies hypoglycemic s/sx including dizziness, shakiness, sweating. Patient denies hyperglycemic symptoms including polyuria, polydipsia, polyphagia, nocturia, neuropathy, blurred vision.  Current meal patterns:  Discussed meal planning options and Plate method for healthy eating Avoid sugary drinks and  desserts Incorporate balanced protein, non starchy veggies, 1 serving of carbohydrate with each meal Increase water intake Increase physical activity as able  Current physical activity: limited due to current foot infection; s/p toe amputations in the past  Current medication access support: none  Macrovascular and Microvascular Risk Reduction:  Statin? yes (rosuvastatin ); ACEi/ARB? No, lisinopril  in the past--will re-visit Last urinary albumin/creatinine ratio:  Lab Results  Component Value Date   MICRALBCREAT 6 02/14/2024   MICRALBCREAT <3 10/28/2022   MICRALBCREAT 6 01/14/2020   MICRALBCREAT 7.2 11/07/2017   MICRALBCREAT <4.2 11/29/2016   MICRALBCREAT <1.9 11/25/2015   Last eye exam:  Lab Results  Component Value Date   HMDIABEYEEXA Retinopathy (A) 05/18/2023   Last foot exam: 08/04/2023 Tobacco Use:  Tobacco Use: Low Risk  (08/22/2024)   Patient History    Smoking Tobacco Use: Never    Smokeless Tobacco Use: Never    Passive Exposure: Not on file     Objective:  Lab Results  Component Value Date   HGBA1C 8.5 (H) 08/22/2024    Lab Results  Component Value Date   CREATININE 0.75 08/22/2024   BUN 13 08/22/2024   NA 140 08/22/2024   K 4.2 08/22/2024   CL 102 08/22/2024   CO2 26 08/22/2024    Lab Results  Component Value Date   CHOL 113 08/22/2024   HDL 53 08/22/2024   LDLCALC 42 08/22/2024   LDLDIRECT 83 09/17/2014   TRIG 96 08/22/2024   CHOLHDL 2.1 08/22/2024    Medications Reviewed Today     Reviewed by Billee Mliss BIRCH, RPH (Pharmacist) on 08/26/24 at 1020  Med List Status: <None>   Medication Order Taking? Sig Documenting Provider Last Dose Status Informant  BELBUCA 300 MCG FILM [id number]  Take 1 Film by mouth every 12 (  twelve) hours. [provider]  Active   carbidopa -levodopa  (SINEMET  IR) 25-100 MG tablet [id number]  Take 1 tablet by mouth in the morning and at bedtime. [provider]  Active   Continuous Glucose Receiver  (FREESTYLE LIBRE 3 READER) DEVI [id number]  1 each by Does not apply route every 14 (fourteen) days. Dettinger, Fonda LABOR, MD  Active   Continuous Glucose Sensor (FREESTYLE LIBRE 3 PLUS SENSOR) MISC [id number]  Change sensor every 15 days. Dettinger, Fonda LABOR, MD  Active   doxycycline  (VIBRA -TABS) 100 MG tablet [id number]  Take 100 mg by mouth 2 (two) times daily. [provider]  Active   DULoxetine  (CYMBALTA ) 60 MG capsule [id number]  Take 1 capsule (60 mg total) by mouth at bedtime. Dettinger, Fonda LABOR, MD  Active   fluconazole  (DIFLUCAN ) 150 MG tablet [id number]  Take 150 mg by mouth daily. [provider]  Active   ibuprofen (ADVIL) 800 MG tablet [id number]  Take 800 mg by mouth every 8 (eight) hours as needed. [provider]  Active   insulin  aspart (NOVOLOG  FLEXPEN) 100 UNIT/ML FlexPen [id number]  Inject 3-20 Units into the skin 3 (three) times daily with meals. Dettinger, Fonda LABOR, MD  Active   insulin  glargine (LANTUS  SOLOSTAR) 100 UNIT/ML Solostar Pen [id number]  Inject 30 Units into the skin daily. Dettinger, Fonda LABOR, MD  Active   Insulin  Pen Needle (GLOBAL EASE INJECT PEN NEEDLES) 31G X 5 MM MISC [id number]  USE FOUR TIMES DAILY Joshua Clayborne RAMAN, PA-C  Active Self  metFORMIN  (GLUCOPHAGE ) 500 MG tablet [id number]  Take 1 tablet (500 mg total) by mouth 2 (two) times daily with a meal. Dettinger, Fonda LABOR, MD  Active   OXcarbazepine  (TRILEPTAL ) 150 MG tablet [id number]  TAKE 1 TABLET BY MOUTH TWICE DAILY Gayland Lauraine PARAS, NP  Active   pregabalin  (LYRICA ) 150 MG capsule [id number]  1 in am 2 at night Onita Duos, MD  Active   rOPINIRole  (REQUIP ) 1 MG tablet [id number]  TAKE 1 TABLET BY MOUTH AT BEDTIME Onita Duos, MD  Active   rosuvastatin  (CRESTOR ) 5 MG tablet [id number]  Take 1 tablet (5 mg total) by mouth at bedtime. Dettinger, Fonda LABOR, MD  Active   TRULICITY  4.5 MG/0.5ML EMMANUEL [id number]  Inject 4.5 mg into the skin once a week.  Patient not taking: Reported on 08/26/2024    Dettinger, Fonda LABOR, MD  Active   VITAMIN D  PO [id number]  Take 1,000 Units by mouth daily. [provider]  Active               Assessment/Plan:   Diabetes: - Currently uncontrolled; goal A1c <7%. Cardiorenal risk reduction is opportunities for improvement.. Blood pressure is at goal <130/80. LDL is at goal.  - Reviewed long term cardiovascular and renal outcomes of uncontrolled blood sugar. and Reviewed goal A1c, goal fasting, and goal 2 hour post prandial glucose. Recommended to check glucose 3 times daily or if symptomatic - Start Mounjaro 2.5mg  weekly (sample box given for 28 day supply) -Stop trulicity  -Continue insulins and metformin  Enroll novo nordisk PAP insulin  (tresiba and novolog ) -Would like to encourage CGM at follow up appt  Follow Up Plan: pharmd appt to be scheduled; [id number] placed  Mliss Tarry Griffin, PharmD, BCACP, CPP Clinical Pharmacist, [facility]

## 2024-08-22 NOTE — Telephone Encounter (Signed)
 Please enroll for novo nordisk PAP for tresiba 30 units daily and novolog  3-20 units 3 times daily  Send me PAP application Patient in clinic to sign Thank you!

## 2024-08-27 ENCOUNTER — Other Ambulatory Visit: Payer: Self-pay | Admitting: Family Medicine

## 2024-08-27 DIAGNOSIS — E1142 Type 2 diabetes mellitus with diabetic polyneuropathy: Secondary | ICD-10-CM

## 2024-08-27 DIAGNOSIS — E1149 Type 2 diabetes mellitus with other diabetic neurological complication: Secondary | ICD-10-CM

## 2024-08-27 DIAGNOSIS — E1049 Type 1 diabetes mellitus with other diabetic neurological complication: Secondary | ICD-10-CM

## 2024-08-27 NOTE — Telephone Encounter (Unsigned)
 Copied from CRM #[id number]. Topic: Clinical - Medication Refill >> Aug 27, 2024 10:21 AM Montie POUR wrote: Medication: Continuous Glucose Sensor (FREESTYLE LIBRE 3 PLUS SENSOR) MISC   Has the patient contacted their pharmacy? Yes (Agent: If no, request that the patient contact the pharmacy for the refill. If patient does not wish to contact the pharmacy document the reason why and proceed with request.) (Agent: If yes, when and what did the pharmacy advise?)  This is the patient's preferred pharmacy:  [facility] - [place], [place] - [address] [place] [address] Phone: [phone] Fax: [phone]  Is this the correct pharmacy for this prescription? Yes If no, delete pharmacy and type the correct one.   Has the prescription been filled recently? No  Is the patient out of the medication? No  1 day left  Has the patient been seen for an appointment in the last year OR does the patient have an upcoming appointment? Yes  Can we respond through MyChart? No  Agent: Please be advised that Rx refills may take up to 3 business days. We ask that you follow-up with your pharmacy.

## 2024-08-28 ENCOUNTER — Other Ambulatory Visit: Payer: Self-pay | Admitting: Family Medicine

## 2024-08-28 ENCOUNTER — Ambulatory Visit: Payer: Self-pay | Admitting: Family Medicine

## 2024-08-28 DIAGNOSIS — E1149 Type 2 diabetes mellitus with other diabetic neurological complication: Secondary | ICD-10-CM

## 2024-08-28 DIAGNOSIS — E1049 Type 1 diabetes mellitus with other diabetic neurological complication: Secondary | ICD-10-CM

## 2024-08-28 DIAGNOSIS — E1142 Type 2 diabetes mellitus with diabetic polyneuropathy: Secondary | ICD-10-CM

## 2024-08-28 MED ORDER — FREESTYLE LIBRE 3 PLUS SENSOR MISC: 3 refills | Status: DC

## 2024-08-29 NOTE — Telephone Encounter (Signed)
 Continuous Glucose Sensor (DEXCOM G7 SENSOR) MISC        Changed from: Continuous Glucose Sensor (FREESTYLE LIBRE 3 PLUS SENSOR) MISC    Pharmacy comment: Insurance prefers dexcom. Please send new script if appropriate   Change sent

## 2024-09-03 ENCOUNTER — Telehealth: Payer: Self-pay

## 2024-09-03 ENCOUNTER — Other Ambulatory Visit (HOSPITAL_COMMUNITY): Payer: Self-pay

## 2024-09-03 NOTE — Telephone Encounter (Signed)
 Pharmacy Patient Advocate Encounter   Received notification from Onbase that prior authorization for Dexcom G7 Sensor  is required/requested.   Insurance verification completed.   The patient is insured through [facility] .   Per test claim: PA required; PA submitted to above mentioned insurance via Latent Key/confirmation #/EOC [id number] Status is pending

## 2024-09-03 NOTE — Telephone Encounter (Signed)
 Pharmacy Patient Advocate Encounter  Received notification from [facility] that Prior Authorization for  Dexcom G7 Sensor  has been APPROVED from 09/03/24 to 09/03/25. Ran test claim, Copay is $47. This test claim was processed through [facility]- copay amounts may vary at other pharmacies due to pharmacy/plan contracts, or as the patient moves through the different stages of their insurance plan.   PA #/Case ID/Reference #: [id number]

## 2024-09-03 NOTE — Progress Notes (Signed)
 Care Guide Pharmacy Note  09/03/2024 Name: Rhonda Thomas MRN: [id number] DOB: Aug 12, 1956  Referred By: Maryanne Fonda LABOR, MD Reason for referral: Complex Care Management (Outreach to schedule with pharm d )   Rhonda Thomas is a 68 y.o. year old female who is a primary care patient of Dettinger, Fonda LABOR, MD.  Rhonda Thomas was referred to the pharmacist for assistance related to: DMII  Successful contact was made with the patient to discuss pharmacy services including being ready for the pharmacist to call at least 5 minutes before the scheduled appointment time and to have medication bottles and any blood pressure readings ready for review. The patient agreed to meet with the pharmacist via telephone visit on (date/time).09/25/2024  Jeoffrey Buffalo , RMA     [place]  [facility], [facility] Guide  Direct Dial: [phone]  Website: [email or web address]

## 2024-09-04 ENCOUNTER — Other Ambulatory Visit (HOSPITAL_COMMUNITY): Payer: Self-pay

## 2024-09-05 ENCOUNTER — Encounter: Payer: Self-pay | Admitting: Psychology

## 2024-09-05 ENCOUNTER — Encounter: Attending: Psychology | Admitting: Psychology

## 2024-09-05 DIAGNOSIS — F5101 Primary insomnia: Secondary | ICD-10-CM | POA: Diagnosis not present

## 2024-09-05 DIAGNOSIS — F411 Generalized anxiety disorder: Secondary | ICD-10-CM | POA: Diagnosis not present

## 2024-09-05 DIAGNOSIS — F331 Major depressive disorder, recurrent, moderate: Secondary | ICD-10-CM | POA: Insufficient documentation

## 2024-09-05 DIAGNOSIS — Z794 Long term (current) use of insulin: Secondary | ICD-10-CM | POA: Diagnosis not present

## 2024-09-05 DIAGNOSIS — E1142 Type 2 diabetes mellitus with diabetic polyneuropathy: Secondary | ICD-10-CM | POA: Insufficient documentation

## 2024-09-05 NOTE — Progress Notes (Signed)
 Patient ID: Rhonda Thomas, female   DOB: [birth year], 68 y.o.   MRN: [id number] [name]   DOB: March 10, 1956  MR Number: [id number]  Location: [facility] [address] [place] [address] Dept: [phone]  Start: 9 AM End: 10 AM  Today's visit was an in person visit that lasted for 1 hour and was conducted in my outpatient clinic office with the patient myself present.  Today's visit was assisted the use of a digital scribe for a note taking and organization of clinical note.  The use of this system was reviewed with the patient before hand during previous visits and the patient provided willingness and agreement to proceed with the aid of a digital scribe.  The results from the digital scribe output was reviewed for accuracy and portions of it was added to the current.  Provider/Observer:     Norleen JONELLE Asa PsyD  Chief Complaint:      Chief Complaint  Patient presents with   Depression   Anxiety   ADHD   Pain   Stress     Reason For Service:     I have worked with the patient for several years now. She was initially referred here for difficulty coping with the situation involving her children. Her youngest son is at severe difficulties over the years with regard to mood disorder, substance abuse and behavioral problems. Now there are major stressors associated with him again the patient has been essentially overwhelmed and unsure about what to do about. She reports that this has created a lot of stress both at home and at work. On top of that, her diabetes has gotten to the point that she had hammertoe amputated because of that. She has neuropathy as a result of diabetes as well and has had to stay out of her foot for 8 weeks.  The psychosocial stressors are having significant negative impact upon her severe diabetes with neuropathy and loss of toe.   The patient continues to have recurrent issues with residual effects of her type 2 diabetes with significant medical complications.  The reason for service has been reviewed and remains applicable for the current visit.  The patient reports that she is still dealing with anxiety and depressive type symptomatology and continues to take her psychotropic medications including Cymbalta , Trileptal  and also taking Lyrica  for peripheral neuropathy.   The patient has a history of a significant wound on the heel which required a wound vac and was approximately two inches deep down to the bone. She now presents with a new wound on the ball of her right foot, which is reportedly not infected. She is contemplating changing surgeons from her current surgeon to Dr. Harden, citing concerns with having to go to [facility] with ease of transportation, the current surgeon's communication style and availability, as the surgeon works at [employer] and is in surgery for a full week every third week. The patient expresses a preference for a doctor who is more consistently available in their office.  Current Psychosocial Factors:       The patient is experiencing significant psychosocial stress related to an estrangement from her son, [name], and his wife. This has resulted in a lack of contact and the inability to see her granddaughter, Jinnie. The patient reports ruminating on the cause of the conflict, reviewing past interactions, and feeling responsible. This distress is impacting her  well-being and has previously led her to consider canceling necessary surgery. A major stressor is the belief that her son's wife is manipulating the situation and is paranoid. The patient also reports significant distress about her son [name]'s recent hospitalization for a bone infection in his leg, stemming from an infected toenail. She is concerned about his health, particularly his diabetes and weight, and his wife's perceived inability to care  for him. She has offered to have him stay with her for eight weeks to assist in his recovery. The patient describes a complex family dynamic, including a past disclosure from her daughter, [name], about during their childhood. The patient feels this information, which she addressed with her son [name] about, may be a factor in the current estrangement. She is also distressed by her son's lack of contact with his father, Milderd, who is scheduled for surgery.  Content of Session:                          The session focused on the patient's significant distress regarding the estrangement from her son, [name]. The patient explored potential causes for the conflict, including her reactions to his wife, past conversations that may have been misinterpreted as threats, and the recent disclosure of a past childhood incident between [place] and his sister. A significant portion of the session was dedicated to psychoeducation and reframing. This Clinical research associate explored the possibility that the conflict is not a direct cause-and-effect result of the patient's actions, but rather a complex issue involving her son and his wife's own dynamics, potential shame, guilt, and paranoia. Interventions focused on helping the patient understand that she has limited control over the situation and that her attempts to fix it could be counterproductive by feeding into their potential paranoia. The Clinical research associate used systematic desensitization by discussing the patient's actions, such as watching her daughter-in-law's sermons online to see her granddaughter, and labeling them as behaviors that could reinforce the son's and his wife's fears and lock the current dynamic in place. The session also addressed the patient's medical concerns, affirming her right to seek a second opinion regarding her foot wound. The patient's son [name]'s health crisis was also discussed.  Effectiveness of Interventions:        The patient engaged well with the reframing of the  family conflict, showing some acceptance of the idea that the situation may not be her fault or within her control to fix. She expressed understanding of the concept that her actions could inadvertently worsen the situation. However, she also verbalized the significant emotional difficulty she has in ceasing her attempts to find a solution and her deep-seated need to solve stuff. The patient acknowledged the emotional toll of the situation, stating she is having a rough summer and that the stress is affecting her physically. The discussion appeared to help her connect her emotional distress to her physical well-being.  Goals Addressed Today:                 The session addressed the patient's goal of managing distress related to family conflict. Work was done to reframe her perspective on the estrangement from her son, focusing on reducing self-blame and ceasing behaviors that could perpetuate the conflict. This included advising her to stop watching her daughter-in-law's online sermons and to cease outreach attempts. The goal of prioritizing her own physical health was also addressed, linking her emotional distress to her physical wellness and reinforcing the importance of proceeding with  her own medical care.  Participation Level:   Active  Participation Quality:  Appropriate      Behavioral Observation:  Well Groomed, Alert, and Appropriate.   Last Reviewed:   09/05/2024  Impression/Diagnosis:   The patient has a long history of attention deficit disorder but due to Maj. psychosocial stressors she also developed clinical depression and anxiety. I think that her attentional problems were valid prior to the development of these issues that existed her entire life. However, she is struggling with social challenges and recurrent trauma with regard primarily to her son and to her daughter to a lesser degree in years past.     Norleen JONELLE Asa, PsyD 09/05/2024

## 2024-09-12 ENCOUNTER — Other Ambulatory Visit (HOSPITAL_COMMUNITY): Payer: Self-pay

## 2024-09-16 ENCOUNTER — Telehealth: Payer: Self-pay

## 2024-09-16 NOTE — Telephone Encounter (Signed)
 Received shipment of Tresiba from patient assistance today. Contacted patient and left a detailed message on voicemail that medication was here and ready for pick up

## 2024-09-17 ENCOUNTER — Encounter: Payer: Self-pay | Admitting: Orthopedic Surgery

## 2024-09-17 ENCOUNTER — Telehealth: Payer: Self-pay | Admitting: Family Medicine

## 2024-09-17 ENCOUNTER — Other Ambulatory Visit (INDEPENDENT_AMBULATORY_CARE_PROVIDER_SITE_OTHER): Payer: Self-pay

## 2024-09-17 ENCOUNTER — Ambulatory Visit: Admitting: Orthopedic Surgery

## 2024-09-17 DIAGNOSIS — M79671 Pain in right foot: Secondary | ICD-10-CM

## 2024-09-17 DIAGNOSIS — M79672 Pain in left foot: Secondary | ICD-10-CM

## 2024-09-17 DIAGNOSIS — M86172 Other acute osteomyelitis, left ankle and foot: Secondary | ICD-10-CM

## 2024-09-17 DIAGNOSIS — G8929 Other chronic pain: Secondary | ICD-10-CM

## 2024-09-17 DIAGNOSIS — M869 Osteomyelitis, unspecified: Secondary | ICD-10-CM

## 2024-09-17 HISTORY — DX: Osteomyelitis, unspecified: M86.9

## 2024-09-17 HISTORY — DX: Other chronic pain: G89.29

## 2024-09-17 NOTE — Progress Notes (Signed)
 Office Visit Note   Patient: Rhonda Thomas           Date of Birth: 1956-06-29           MRN: [id number] Visit Date: 09/17/2024              Requested by: Dettinger, Fonda LABOR, MD [address] [place],  [place] [address] PCP: Dettinger, Fonda LABOR, MD  Chief Complaint  Patient presents with   Right Foot - Open Wound      HPI: Discussed the use of AI scribe software for clinical note transcription with the patient, who gave verbal consent to proceed.  History of Present Illness Rhonda Thomas is a 68 year old female with chronic foot ulcers and osteomyelitis who presents for evaluation of her foot ulcers and wound management. She was referred by Dr. Isela from [facility] for further management of her foot ulcers.  She has a burning sensation in her right big toe, which developed into an ulcer beneath the proximal phalanx approximately two weeks ago. There is a history of partial toe amputations on the right foot. No recent x-ray of this foot has been performed in the past week.  She also has a chronic ulcer on her left heel, present since December of the previous year. She underwent partial calcaneal excision surgery in June and has been using a wound vac since then. She has been receiving care from podiatry and infectious disease specialists and has been on antibiotics for the condition.  She has been using a wound vac and has undergone total contact casting on the left foot. The wound vac is described as heavy and cumbersome. She has been following a regimen of wet-to-dry dressings, which were changed to a zero form daily dressing by a wound care specialist due to issues with the previous dressing sticking to her skin.  Her most recent A1c is 8%.     Assessment & Plan: Visit Diagnoses:  1. Bilateral foot pain   2. Acute osteomyelitis of left calcaneus (HCC)   3. Osteomyelitis of great toe of right foot (HCC)     Plan: Assessment and Plan Assessment &  Plan Chronic osteomyelitis of left calcaneus with associated heel ulceration, status post partial calcaneal excision Chronic osteomyelitis with extensive bony destruction and ulceration extending to bone. Surgical intervention required. - Order x-ray of left foot to assess heel bone. - Plan surgical debridement to remove infected bone from left calcaneus. - Send tissue for cultures during surgery. - Administer vancomycin and tobramycin antibiotics. - Apply tissue graft and use a different type of wound vac post-surgery. - Hospitalize for wound vac management until drainage slows. - Discontinue current wound vac and use soap and water with gauze and ace wrap for dressing.  Chronic osteomyelitis of right great toe with associated ulceration, status post partial amputation Chronic osteomyelitis with destructive changes at resection margin. Foreign body noted on x-ray. Further surgical intervention needed. - Order x-ray of right foot to assess bone condition. - Plan surgical removal of remaining infected bone in right great toe.      Follow-Up Instructions: Return in about 2 weeks (around 10/01/2024).   Ortho Exam  Patient is alert, oriented, no adenopathy, well-dressed, normal affect, normal respiratory effort. Physical Exam CARDIOVASCULAR: Strong palpable dorsalis pedis and posterior tibial pulse bilaterally. EXTREMITIES: Ulcer over left heel, 4x5 cm, 2 cm deep, probes to bone. Ulcer on right great toe does not probe to bone. No surrounding cellulitis.  Imaging: XR Foot 2 Views Right Result Date: 09/17/2024 2 view radiographs of the right foot shows destructive changes of the tuft of the residual proximal phalanx consistent with chronic osteomyelitis.  XR Foot 2 Views Left Result Date: 09/17/2024 2 view radiographs of the left calcaneus shows chronic osteomyelitis of the calcaneus with an ulcer that extends to bone.     Labs: Lab Results  Component Value Date   HGBA1C  8.5 (H) 08/22/2024   HGBA1C 7.1 (H) 05/16/2024   HGBA1C 6.8 (H) 02/14/2024   ESRSEDRATE 3 10/13/2020   CRP 3 10/13/2020     Lab Results  Component Value Date   ALBUMIN 3.4 (L) 08/22/2024   ALBUMIN 3.8 (L) 02/14/2024   ALBUMIN 3.8 (L) 08/04/2023    No results found for: MG Lab Results  Component Value Date   VD25OH 17.4 (L) 10/13/2020   VD25OH 27.2 (L) 08/25/2017    No results found for: PREALBUMIN    Latest Ref Rng & Units 08/22/2024    9:21 AM 02/14/2024    9:53 AM 08/04/2023    1:39 PM  CBC EXTENDED  WBC 3.4 - 10.8 x10E3/uL 6.2  7.3  4.9   RBC 3.77 - 5.28 x10E6/uL 4.01  4.20  4.35   Hemoglobin 11.1 - 15.9 g/dL 89.0  87.9  87.2   HCT 34.0 - 46.6 % 35.5  36.3  39.2   Platelets 150 - 450 x10E3/uL 300  304  208   NEUT# 1.4 - 7.0 x10E3/uL 3.1  4.1  2.9   Lymph# 0.7 - 3.1 x10E3/uL 2.2  2.2  1.4      There is no height or weight on file to calculate BMI.  Orders:  Orders Placed This Encounter  Procedures   XR Foot 2 Views Right   XR Foot 2 Views Left   No orders of the defined types were placed in this encounter.    Procedures: No procedures performed  Clinical Data: No additional findings.  ROS:  All other systems negative, except as noted in the HPI. Review of Systems  Objective: Vital Signs: There were no vitals taken for this visit.  Specialty Comments:  No specialty comments available.  PMFS History: Patient Active Problem List   Diagnosis Date Noted   Diabetes mellitus (HCC) 10/28/2022   Nuclear sclerotic cataract of both eyes 05/05/2022   Personal history of diabetic foot ulcer 08/10/2019   Bilateral carpal tunnel syndrome 02/25/2019   Major depressive disorder, recurrent episode, moderate (HCC) 02/09/2018   Diabetic peripheral neuropathy (HCC) 11/07/2017   Vitamin B12 deficiency 11/07/2017   DDD (degenerative disc disease), lumbosacral 10/02/2017   Overweight (BMI 25.0-29.9) 10/24/2016   Somnolence, daytime 03/10/2015   Generalized  anxiety disorder 06/21/2013   Attention deficit disorder 06/21/2013   Hyperlipidemia 06/21/2013   Restless legs syndrome (RLS) 06/20/2013   Mild nonproliferative diabetic retinopathy of both eyes (HCC) 06/20/2013   Past Medical History:  Diagnosis Date   Anxiety    Attention deficit disorder (ADD)    Depression    Diabetes (HCC)    Hiatal hernia    Movement disorder    Neuropathy    Schatzki's ring    Sleep apnea     Family History  Problem Relation Age of Onset   Stroke Mother    Sleep apnea Mother    Diabetes Father    Heart disease Father        pacemaker   Sleep apnea Father    Diabetes Other  Heart Problems Other    Colon cancer Neg Hx    Breast cancer Neg Hx     Past Surgical History:  Procedure Laterality Date   ABDOMINAL HYSTERECTOMY     BREAST LUMPECTOMY WITH RADIOACTIVE SEED LOCALIZATION Right 04/25/2016   Procedure: RIGHT BREAST LUMPECTOMY WITH RADIOACTIVE SEED LOCALIZATION;  Surgeon: Deward Null III, MD;  Location: [facility];  Service: General;  Laterality: Right;   CATARACT EXTRACTION, BILATERAL Bilateral    CHOLECYSTECTOMY     RECTOCELE REPAIR     SPINAL CORD STIMULATOR IMPLANT     TOE AMPUTATION Right    2nd toe rt foot   TUBAL LIGATION     Social History   Occupational History   Occupation: Retired    Associate Professor: [employer]    Comment: [employer]  Tobacco Use   Smoking status: Never   Smokeless tobacco: Never  Vaping Use   Vaping status: Never Used  Substance and Sexual Activity   Alcohol use: No    Alcohol/week: 0.0 standard drinks of alcohol   Drug use: Never   Sexual activity: Yes    Birth control/protection: Post-menopausal

## 2024-09-17 NOTE — Telephone Encounter (Signed)
 Patient is asking for samples of Mounjaro. Has to have PA on her RX and she is out. Please call patient to let her know if we have samples.

## 2024-09-18 ENCOUNTER — Telehealth (HOSPITAL_COMMUNITY): Payer: Self-pay

## 2024-09-18 ENCOUNTER — Telehealth (INDEPENDENT_AMBULATORY_CARE_PROVIDER_SITE_OTHER): Payer: Self-pay | Admitting: Pharmacist

## 2024-09-18 ENCOUNTER — Other Ambulatory Visit (HOSPITAL_COMMUNITY): Payer: Self-pay

## 2024-09-18 DIAGNOSIS — E1169 Type 2 diabetes mellitus with other specified complication: Secondary | ICD-10-CM

## 2024-09-18 DIAGNOSIS — Z794 Long term (current) use of insulin: Secondary | ICD-10-CM

## 2024-09-18 MED ORDER — TIRZEPATIDE 5 MG/0.5ML ~~LOC~~ SOAJ
5.0000 mg | SUBCUTANEOUS | 2 refills | Status: DC
  Filled 2024-09-18 – 2024-10-08 (×2): qty 2, 28d supply, fill #0
  Filled 2024-11-22: qty 2, 28d supply, fill #1

## 2024-09-18 MED ORDER — FREESTYLE LIBRE 3 PLUS SENSOR MISC: 5 refills | Status: DC

## 2024-09-18 NOTE — Telephone Encounter (Signed)
 09/18/2024 Name: Rhonda Thomas MRN: [id number] DOB: 08-13-1956  Chief Complaint  Patient presents with   Diabetes    Coy Vandoren is a 68 y.o. year old female who was referred for medication management by their primary care provider, Dettinger, Fonda LABOR, MD. They presented for a telephone visit today   They were referred to the pharmacist by their PCP for assistance in managing diabetes, medication access, and complex medication management    Subjective:  Patient reports she is tolerating Mounjaro and has no side effects.  She prefers this to Trulicity .  Her blood sugars have decreased.  She has surgery scheduled next week for her osteomyelitis.  She is to hold GLP1 therapy 1 week prior to surgery.   Care Team: Primary Care Provider: Dettinger, Fonda LABOR, MD    Medication Access/Adherence  Current Pharmacy:  [facility], [place] - [address] [place] [address] Phone: [phone] Fax: [phone]  [facility] [place], [place] - [address] [place] [address] Phone: [phone] Fax: [phone]  [facility] - [facility] [address] [place] [address] Phone: [phone] Fax: [phone]   Patient reports affordability concerns with their medications: Yes  Patient reports access/transportation concerns to their pharmacy: No  Patient reports adherence concerns with their medications:  Yes  GI concerns with trulicity    Diabetes:  Current medications: Lantus  30 units daily, novolog  scale, Mounjaro 2.5mg  weekly-->increasing to 5mg  weekly after surgery, metformin   Medications tried in the past: invokana  (yeast), glimepiride , various brand insulins per insurance preference, sitagliptin ; pioglitazone (allergy), trulicity  4.5mg  (GI intolerance)  Glucose readings: FBG140-160 Needs libre 3 PLUS CGM  Patient denies hypoglycemic s/sx including  dizziness, shakiness, sweating. Patient denies hyperglycemic symptoms including polyuria, polydipsia, polyphagia, nocturia, neuropathy, blurred vision.  Current meal patterns:  Discussed meal planning options and Plate method for healthy eating Avoid sugary drinks and desserts Incorporate balanced protein, non starchy veggies, 1 serving of carbohydrate with each meal Increase water intake Increase physical activity as able  Current physical activity: limited due to current foot infection; s/p toe amputations in the past  Current medication access support: none  Macrovascular and Microvascular Risk Reduction:  Statin? yes (rosuvastatin ); ACEi/ARB? No, lisinopril  in the past--will re-visit Last urinary albumin/creatinine ratio:  Lab Results  Component Value Date   MICRALBCREAT 6 02/14/2024   MICRALBCREAT <3 10/28/2022   MICRALBCREAT 6 01/14/2020   MICRALBCREAT 7.2 11/07/2017   MICRALBCREAT <4.2 11/29/2016   MICRALBCREAT <1.9 11/25/2015   Last eye exam:  Lab Results  Component Value Date   HMDIABEYEEXA Retinopathy (A) 05/18/2023   Last foot exam: 08/04/2023 Tobacco Use:  Tobacco Use: Low Risk  (09/17/2024)   Patient History    Smoking Tobacco Use: Never    Smokeless Tobacco Use: Never    Passive Exposure: Not on file     Objective:  Lab Results  Component Value Date   HGBA1C 8.5 (H) 08/22/2024    Lab Results  Component Value Date   CREATININE 0.75 08/22/2024   BUN 13 08/22/2024   NA 140 08/22/2024   K 4.2 08/22/2024   CL 102 08/22/2024   CO2 26 08/22/2024    Lab Results  Component Value Date   CHOL 113 08/22/2024   HDL 53 08/22/2024   LDLCALC 42 08/22/2024   LDLDIRECT 83 09/17/2014   TRIG 96 08/22/2024   CHOLHDL 2.1 08/22/2024    Medications Reviewed Today  Reviewed by Billee Mliss BIRCH, [facility] (Pharmacist) on 09/18/24 at 1409  Med List Status: <None>   Medication Order Taking? Sig Documenting Provider Last Dose Status Informant  BELBUCA 300 MCG  FILM [id number]  Take 1 Film by mouth every 12 (twelve) hours. [provider]  Active   carbidopa -levodopa  (SINEMET  IR) 25-100 MG tablet [id number]  Take 1 tablet by mouth in the morning and at bedtime. [provider]  Active   Continuous Glucose Receiver (FREESTYLE LIBRE 3 READER) DEVI [id number]  1 each by Does not apply route every 14 (fourteen) days. Dettinger, Fonda LABOR, MD  Active   Continuous Glucose Sensor (DEXCOM G7 SENSOR) MISC [id number]  Change sensors Q 10 days Dettinger, Fonda LABOR, MD  Active   doxycycline  (VIBRA -TABS) 100 MG tablet [id number]  Take 100 mg by mouth 2 (two) times daily. [provider]  Active   DULoxetine  (CYMBALTA ) 60 MG capsule [id number]  Take 1 capsule (60 mg total) by mouth at bedtime. Dettinger, Fonda LABOR, MD  Active   fluconazole  (DIFLUCAN ) 150 MG tablet [id number]  Take 150 mg by mouth daily. [provider]  Active   ibuprofen (ADVIL) 800 MG tablet [id number]  Take 800 mg by mouth every 8 (eight) hours as needed. [provider]  Active   insulin  aspart (NOVOLOG  FLEXPEN) 100 UNIT/ML FlexPen [id number]  Inject 3-20 Units into the skin 3 (three) times daily with meals. Dettinger, Fonda LABOR, MD  Active   insulin  glargine (LANTUS  SOLOSTAR) 100 UNIT/ML Solostar Pen [id number]  Inject 30 Units into the skin daily. Dettinger, Fonda LABOR, MD  Active   Insulin  Pen Needle (GLOBAL EASE INJECT PEN NEEDLES) 31G X 5 MM MISC [id number]  USE FOUR TIMES DAILY Joshua Clayborne RAMAN, PA-C  Active Self  metFORMIN  (GLUCOPHAGE ) 500 MG tablet [id number]  Take 1 tablet (500 mg total) by mouth 2 (two) times daily with a meal. Dettinger, Fonda LABOR, MD  Active   OXcarbazepine  (TRILEPTAL ) 150 MG tablet [id number]  TAKE 1 TABLET BY MOUTH TWICE DAILY Gayland Lauraine PARAS, NP  Active   pregabalin  (LYRICA ) 150 MG capsule [id number]  1 in am 2 at night Onita Duos, MD  Active   rOPINIRole  (REQUIP ) 1 MG tablet [id number]  TAKE 1 TABLET BY MOUTH AT BEDTIME Onita Duos, MD  Active    rosuvastatin  (CRESTOR ) 5 MG tablet [id number]  Take 1 tablet (5 mg total) by mouth at bedtime. Dettinger, Fonda LABOR, MD  Active     Discontinued 09/18/24 1409 (Change in therapy)    Patient not taking:   Discontinued 09/18/24 1409 (Change in therapy)   VITAMIN D  PO [id number]  Take 1,000 Units by mouth daily. [provider]  Active               Assessment/Plan:   Diabetes: - Currently uncontrolled; but blood sugars have improved with addition of Mounjaro and Novolog .  goal A1c <7%. Cardiorenal risk reduction is opportunities for improvement.. Blood pressure is at goal <130/80. LDL is at goal.  - Reviewed long term cardiovascular and renal outcomes of uncontrolled blood sugar. and Reviewed goal A1c, goal fasting, and goal 2 hour post prandial glucose. Recommended to check glucose 3 times daily or if symptomatic - INCREASE Mounjaro to 5mg  weekly--called in to [facility], mail order preference - Continue insulins and metformin  -Libre 3 PLUS CGM called in to [facility] drug per patient preference  Follow Up Plan: pharmd appt 10/08/24 post op   Mliss Tarry Billee, PharmD, BCACP, CPP Clinical Pharmacist,  [facility]

## 2024-09-19 ENCOUNTER — Other Ambulatory Visit (HOSPITAL_COMMUNITY): Payer: Self-pay

## 2024-09-19 ENCOUNTER — Telehealth (HOSPITAL_COMMUNITY): Payer: Self-pay

## 2024-09-19 NOTE — Telephone Encounter (Signed)
 PA request has been Received. New Encounter has been or will be created for follow up. For additional info see Pharmacy Prior Auth telephone encounter from 09/19/24.

## 2024-09-19 NOTE — Telephone Encounter (Signed)
 Pharmacy Patient Advocate Encounter  Received notification from [facility] [employer] that Prior Authorization for Mounjaro 5MG /0.5ML auto-injectors  has been APPROVED from 09/19/24 to 09/19/25. Ran test claim, Copay is $0. This test claim was processed through [facility]- copay amounts may vary at other pharmacies due to pharmacy/plan contracts, or as the patient moves through the different stages of their insurance plan.   PA #/Case ID/Reference #: [id number]

## 2024-09-20 ENCOUNTER — Telehealth: Payer: Self-pay

## 2024-09-20 NOTE — Telephone Encounter (Addendum)
 Patient assistance medication received   Novolog  FlexPen x5 boxes  Lot [id number] EXP 12/11/26  Novofine - 32 tip pen needles x5 boxes  Lot [id number]    exp 12/11/2028   Called and notified patient that medication is ready for pick up

## 2024-09-22 NOTE — Telephone Encounter (Signed)
 Call returned  See separate pharmd encounter

## 2024-09-23 ENCOUNTER — Encounter (HOSPITAL_COMMUNITY): Payer: Self-pay | Admitting: Orthopedic Surgery

## 2024-09-23 NOTE — Progress Notes (Incomplete)
 PCP - Dr Fonda Dettinger Cardiologist - none [facility] DIseases - Dr Oretha Hering  Chest x-ray - n/a EKG - n/a Stress Test - n/a ECHO - n/a Cardiac Cath - n/a  ICD Pacemaker/Loop - n/a  Sleep Study -  Yes (08/2021) CPAP - none -intolerance to mask  Diabetes Type 2 Do not take Metformin  on the morning of surgery.  THE NIGHT BEFORE SURGERY, take 14 units Lantus  Insulin .       THE MORNING OF SURGERY, do not take Novolog  Insulin  unless yours CBG >220 mg/dL.  If CBG is > 220 mg/dL, you may take 1/2 of your sliding scale (correction) dose of insulin .  If your blood sugar is less than 70 mg/dL, you will need to treat for low blood sugar: Treat a low blood sugar (less than 70 mg/dL) with  cup of clear juice (cranberry or apple), 4 glucose tablets, OR glucose gel. Recheck blood sugar in 15 minutes after treatment (to make sure it is greater than 70 mg/dL). If your blood sugar is not greater than 70 mg/dL on recheck, call [phone] for further instructions.  Aspirin  & Blood Thinner Instructions:  n/a  Belbuca - last dose was on 09/23/24 per pt.  ERAS - clear liquids til 0750 on DOS  Anesthesia review: no  STOP now taking any Aspirin  (unless otherwise instructed by your surgeon), Aleve, Naproxen, Ibuprofen, Motrin, Advil, Goody's, BC's, all herbal medications, fish oil, and all vitamins.   Spinal Cord Stimulator implant not currently working per patient on 09/24/24.  Coronavirus Screening Do you have any of the following symptoms:  Cough yes/no: No Fever (>100.40F)  yes/no: No Runny nose yes/no: No Sore throat yes/no: No Difficulty breathing/shortness of breath  yes/no: No  Have you traveled in the last 14 days and where? yes/no: No  Patient verbalized understanding of instructions that were given to them at the PAT appointment. Patient was also instructed that they will need to review over the PAT instructions again at home before surgery.

## 2024-09-24 ENCOUNTER — Other Ambulatory Visit: Payer: Self-pay

## 2024-09-24 ENCOUNTER — Encounter (HOSPITAL_COMMUNITY): Payer: Self-pay | Admitting: Orthopedic Surgery

## 2024-09-24 NOTE — Progress Notes (Signed)
 Spoke with patient and informed her to arrive at 0750 for a 1023 surgical start time.

## 2024-09-24 NOTE — H&P (Signed)
 Rhonda Thomas is an 68 y.o. female.   Chief Complaint:  HPI: Rhonda Thomas is a 68 year old female with chronic foot ulcers and osteomyelitis who presents for evaluation of her foot ulcers and wound management. She was referred by Dr. Isela from [facility] for further management of her foot ulcers.   She has a burning sensation in her right big toe, which developed into an ulcer beneath the proximal phalanx approximately two weeks ago. There is a history of partial toe amputations on the right foot. No recent x-ray of this foot has been performed in the past week.   She also has a chronic ulcer on her left heel, present since December of the previous year. She underwent partial calcaneal excision surgery in June and has been using a wound vac since then. She has been receiving care from podiatry and infectious disease specialists and has been on antibiotics for the condition.   She has been using a wound vac and has undergone total contact casting on the left foot. The wound vac is described as heavy and cumbersome. She has been following a regimen of wet-to-dry dressings, which were changed to a zero form daily dressing by a wound care specialist due to issues with the previous dressing sticking to her skin.   Her most recent A1c is 8%.    Past Medical History:  Diagnosis Date   Anxiety    Attention deficit disorder (ADD)    Chronic foot pain 09/17/2024   bilateral - hx chronic foot ulcers   Depression    Diabetes (HCC)    Hiatal hernia    HLD (hyperlipidemia)    no meds, diet controlled per pt on 1014/25   Movement disorder    Neuropathy of both feet 08/21/2024   in [facility]   Osteomyelitis (HCC) 09/17/2024   left calcaneus and great toe of right foot   Restless legs syndrome (RLS) 08/21/2024   in [facility]   Schatzki's ring    Sleep apnea    does not use CPAP - intolerance to mask    Past Surgical History:  Procedure Laterality Date   ABDOMINAL HYSTERECTOMY      BREAST LUMPECTOMY WITH RADIOACTIVE SEED LOCALIZATION Right 04/25/2016   Procedure: RIGHT BREAST LUMPECTOMY WITH RADIOACTIVE SEED LOCALIZATION;  Surgeon: Deward Null III, MD;  Location: [facility];  Service: General;  Laterality: Right;   CATARACT EXTRACTION, BILATERAL Bilateral    CHOLECYSTECTOMY     COLONOSCOPY  2013   FOOT SURGERY Left 05/2024   at [facility]   RECTOCELE REPAIR     SPINAL CORD STIMULATOR IMPLANT     not currently working per pt on 09/24/24   TOE AMPUTATION Right    2nd toe rt foot   TUBAL LIGATION     UPPER GI ENDOSCOPY  2016    Family History  Problem Relation Age of Onset   Stroke Mother    Sleep apnea Mother    Diabetes Father    Heart disease Father        pacemaker   Sleep apnea Father    Diabetes Other    Heart Problems Other    Colon cancer Neg Hx    Breast cancer Neg Hx    Social History:  reports that she has never smoked. She has never used smokeless tobacco. She reports that she does not drink alcohol and does not use drugs.  Allergies:  Allergies  Allergen Reactions   Canagliflozin  Other (  See Comments)    vaginitis    Pioglitazone Other (See Comments)    Edema     No medications prior to admission.    No results found for this or any previous visit (from the past 48 hours). No results found.  Review of Systems  All other systems reviewed and are negative.   Height 5' 9 (1.753 m), weight 87.5 kg. Physical Exam  Patient is alert,  oriented,  no adenopathy,  well-dressed,  normal affect,  normal respiratory effort.  CARDIOVASCULAR: Strong palpable dorsalis pedis and posterior tibial pulse bilaterally. EXTREMITIES: Ulcer over left heel, 4x5 cm, 2 cm deep, probes to bone. Ulcer on right great toe does not probe to bone. No surrounding cellulitis.   Assessment/Plan Visit Diagnoses:  1. Bilateral foot pain   2. Acute osteomyelitis of left calcaneus (HCC)   3. Osteomyelitis of great toe of right foot (HCC)        Plan: Assessment and Plan Assessment & Plan Chronic osteomyelitis of left calcaneus with associated heel ulceration, status post partial calcaneal excision Chronic osteomyelitis with extensive bony destruction and ulceration extending to bone. Surgical intervention required. - Order x-ray of left foot to assess heel bone. - Plan surgical debridement to remove infected bone from left calcaneus. - Send tissue for cultures during surgery. - Administer vancomycin and tobramycin antibiotics. - Apply tissue graft and use a different type of wound vac post-surgery. - Hospitalize for wound vac management until drainage slows. - Discontinue current wound vac and use soap and water with gauze and ace wrap for dressing.  Chronic osteomyelitis of right great toe with associated ulceration, status post partial amputation Chronic osteomyelitis with destructive changes at resection margin. Foreign body noted on x-ray. Further surgical intervention needed. - Order x-ray of right foot to assess bone condition. - Plan surgical removal of remaining infected bone in right great toe.  Maurilio Deland Collet, PA-C 09/24/2024, 6:57 PM

## 2024-09-25 ENCOUNTER — Other Ambulatory Visit

## 2024-09-25 ENCOUNTER — Inpatient Hospital Stay (HOSPITAL_COMMUNITY): Payer: Self-pay | Admitting: Anesthesiology

## 2024-09-25 ENCOUNTER — Other Ambulatory Visit: Payer: Self-pay

## 2024-09-25 ENCOUNTER — Observation Stay (HOSPITAL_COMMUNITY)
Admission: RE | Admit: 2024-09-25 | Discharge: 2024-09-27 | Disposition: A | Discharge status: Home / Self Care | Attending: Orthopedic Surgery | Admitting: Orthopedic Surgery

## 2024-09-25 ENCOUNTER — Encounter (HOSPITAL_COMMUNITY): Payer: Self-pay | Admitting: Orthopedic Surgery

## 2024-09-25 ENCOUNTER — Ambulatory Visit (HOSPITAL_COMMUNITY): Payer: Self-pay | Admitting: Anesthesiology

## 2024-09-25 ENCOUNTER — Encounter (HOSPITAL_COMMUNITY)
Admission: RE | Disposition: A | Discharge status: Home / Self Care | Payer: Self-pay | Source: Home / Self Care | Attending: Orthopedic Surgery

## 2024-09-25 DIAGNOSIS — Z7984 Long term (current) use of oral hypoglycemic drugs: Secondary | ICD-10-CM | POA: Diagnosis not present

## 2024-09-25 DIAGNOSIS — F418 Other specified anxiety disorders: Secondary | ICD-10-CM

## 2024-09-25 DIAGNOSIS — M86672 Other chronic osteomyelitis, left ankle and foot: Secondary | ICD-10-CM | POA: Diagnosis not present

## 2024-09-25 DIAGNOSIS — M86172 Other acute osteomyelitis, left ankle and foot: Principal | ICD-10-CM

## 2024-09-25 DIAGNOSIS — M86671 Other chronic osteomyelitis, right ankle and foot: Secondary | ICD-10-CM | POA: Insufficient documentation

## 2024-09-25 DIAGNOSIS — M869 Osteomyelitis, unspecified: Secondary | ICD-10-CM

## 2024-09-25 DIAGNOSIS — Z794 Long term (current) use of insulin: Secondary | ICD-10-CM | POA: Diagnosis not present

## 2024-09-25 DIAGNOSIS — E785 Hyperlipidemia, unspecified: Secondary | ICD-10-CM

## 2024-09-25 DIAGNOSIS — T148XXA Other injury of unspecified body region, initial encounter: Secondary | ICD-10-CM | POA: Diagnosis present

## 2024-09-25 DIAGNOSIS — S91302A Unspecified open wound, left foot, initial encounter: Secondary | ICD-10-CM | POA: Diagnosis not present

## 2024-09-25 DIAGNOSIS — S91301A Unspecified open wound, right foot, initial encounter: Secondary | ICD-10-CM

## 2024-09-25 DIAGNOSIS — M86171 Other acute osteomyelitis, right ankle and foot: Secondary | ICD-10-CM | POA: Diagnosis not present

## 2024-09-25 DIAGNOSIS — Z8631 Personal history of diabetic foot ulcer: Principal | ICD-10-CM

## 2024-09-25 DIAGNOSIS — E1169 Type 2 diabetes mellitus with other specified complication: Secondary | ICD-10-CM

## 2024-09-25 DIAGNOSIS — M6281 Muscle weakness (generalized): Secondary | ICD-10-CM | POA: Diagnosis not present

## 2024-09-25 DIAGNOSIS — E119 Type 2 diabetes mellitus without complications: Secondary | ICD-10-CM | POA: Insufficient documentation

## 2024-09-25 HISTORY — DX: Hyperlipidemia, unspecified: E78.5

## 2024-09-25 HISTORY — PX: APPLICATION OF WOUND VAC: SHX5189

## 2024-09-25 HISTORY — PX: AMPUTATION TOE: SHX6595

## 2024-09-25 HISTORY — PX: METATARSAL HEAD EXCISION: SHX5027

## 2024-09-25 LAB — COMPREHENSIVE METABOLIC PANEL WITH GFR
ALT: 9 U/L (ref 0–44)
AST: 13 U/L — ABNORMAL LOW (ref 15–41)
Albumin: 2.7 g/dL — ABNORMAL LOW (ref 3.5–5.0)
Alkaline Phosphatase: 94 U/L (ref 38–126)
Anion gap: 11 (ref 5–15)
BUN: 10 mg/dL (ref 8–23)
CO2: 25 mmol/L (ref 22–32)
Calcium: 8.4 mg/dL — ABNORMAL LOW (ref 8.9–10.3)
Chloride: 98 mmol/L (ref 98–111)
Creatinine, Ser: 0.77 mg/dL (ref 0.44–1.00)
GFR, Estimated: >60 mL/min (ref >60)
Glucose, Bld: 228 mg/dL — ABNORMAL HIGH (ref 70–99)
Potassium: 4.5 mmol/L (ref 3.5–5.1)
Sodium: 134 mmol/L — ABNORMAL LOW (ref 135–145)
Total Bilirubin: 0.8 mg/dL (ref 0.0–1.2)
Total Protein: 6.5 g/dL (ref 6.5–8.1)

## 2024-09-25 LAB — CBC WITH DIFFERENTIAL/PLATELET
Abs Immature Granulocytes: 0.03 K/uL (ref 0.00–0.07)
Basophils Absolute: 0 K/uL (ref 0.0–0.1)
Basophils Relative: 0 %
Eosinophils Absolute: 0.4 K/uL (ref 0.0–0.5)
Eosinophils Relative: 4 %
HCT: 36.5 % (ref 36.0–46.0)
Hemoglobin: 11.3 g/dL — ABNORMAL LOW (ref 12.0–15.0)
Immature Granulocytes: 0 %
Lymphocytes Relative: 21 %
Lymphs Abs: 2.2 K/uL (ref 0.7–4.0)
MCH: 27 pg (ref 26.0–34.0)
MCHC: 31 g/dL (ref 30.0–36.0)
MCV: 87.3 fL (ref 80.0–100.0)
Monocytes Absolute: 0.9 K/uL (ref 0.1–1.0)
Monocytes Relative: 8 %
Neutro Abs: 7.2 K/uL (ref 1.7–7.7)
Neutrophils Relative %: 67 %
Platelets: 325 K/uL (ref 150–400)
RBC: 4.18 MIL/uL (ref 3.87–5.11)
RDW: 13.6 % (ref 11.5–15.5)
WBC: 10.8 K/uL — ABNORMAL HIGH (ref 4.0–10.5)
nRBC: 0 % (ref 0.0–0.2)

## 2024-09-25 LAB — GLUCOSE, CAPILLARY
Glucose-Capillary: 243 mg/dL — ABNORMAL HIGH (ref 70–99)
Glucose-Capillary: 169 mg/dL — ABNORMAL HIGH (ref 70–99)

## 2024-09-25 SURGERY — AMPUTATION, TOE
Anesthesia: General | Site: Toe | Laterality: Right

## 2024-09-25 MED ORDER — LACTATED RINGERS IV SOLN: INTRAVENOUS | Status: DC

## 2024-09-25 MED ORDER — ACETAMINOPHEN 500 MG PO TABS
1000.0000 mg | ORAL_TABLET | Freq: Once | ORAL | Status: AC
  Administered 2024-09-25: 1000 mg via ORAL
  Filled 2024-09-25: qty 2

## 2024-09-25 MED ORDER — ONDANSETRON HCL 4 MG/2ML IJ SOLN
INTRAMUSCULAR | Status: AC
  Filled 2024-09-25: qty 2

## 2024-09-25 MED ORDER — KETOROLAC TROMETHAMINE 15 MG/ML IJ SOLN
7.5000 mg | Freq: Four times a day (QID) | INTRAMUSCULAR | Status: DC | PRN
  Administered 2024-09-25 – 2024-09-26 (×2): 7.5 mg via INTRAVENOUS
  Filled 2024-09-25 (×2): qty 1

## 2024-09-25 MED ORDER — PREGABALIN 75 MG PO CAPS
150.0000 mg | ORAL_CAPSULE | Freq: Every morning | ORAL | Status: DC
  Administered 2024-09-26 – 2024-09-27 (×2): 150 mg via ORAL
  Filled 2024-09-25 (×2): qty 2

## 2024-09-25 MED ORDER — FENTANYL CITRATE (PF) 250 MCG/5ML IJ SOLN
INTRAMUSCULAR | Status: AC
  Filled 2024-09-25: qty 5

## 2024-09-25 MED ORDER — TOBRAMYCIN SULFATE 1.2 G IJ SOLR
INTRAMUSCULAR | Status: DC | PRN
  Administered 2024-09-25: 1.2 g

## 2024-09-25 MED ORDER — ORAL CARE MOUTH RINSE
15.0000 mL | Freq: Once | OROMUCOSAL | Status: AC
Start: 2024-09-25 — End: 2024-09-25

## 2024-09-25 MED ORDER — SODIUM CHLORIDE 0.9 % IV SOLN: INTRAVENOUS | Status: AC

## 2024-09-25 MED ORDER — ONDANSETRON HCL 4 MG/2ML IJ SOLN
INTRAMUSCULAR | Status: DC | PRN
Start: 2024-09-25 — End: 2024-09-25
  Administered 2024-09-25: 4 mg via INTRAVENOUS

## 2024-09-25 MED ORDER — INSULIN ASPART 100 UNIT/ML IJ SOLN
0.0000 [IU] | INTRAMUSCULAR | Status: DC | PRN
  Administered 2024-09-25: 6 [IU] via SUBCUTANEOUS
  Filled 2024-09-25: qty 1

## 2024-09-25 MED ORDER — DULOXETINE HCL 60 MG PO CPEP
60.0000 mg | ORAL_CAPSULE | Freq: Every day | ORAL | Status: DC
  Administered 2024-09-25 – 2024-09-26 (×2): 60 mg via ORAL
  Filled 2024-09-25 (×2): qty 1

## 2024-09-25 MED ORDER — BUPRENORPHINE HCL 300 MCG BU FILM
1.0000 | ORAL_FILM | Freq: Two times a day (BID) | BUCCAL | Status: DC
  Administered 2024-09-26 – 2024-09-27 (×2): 1 via ORAL

## 2024-09-25 MED ORDER — ENOXAPARIN SODIUM 40 MG/0.4ML IJ SOSY
40.0000 mg | PREFILLED_SYRINGE | INTRAMUSCULAR | Status: DC
  Administered 2024-09-26 – 2024-09-27 (×2): 40 mg via SUBCUTANEOUS
  Filled 2024-09-25 (×2): qty 0.4

## 2024-09-25 MED ORDER — ONDANSETRON HCL 4 MG PO TABS: 4.0000 mg | ORAL_TABLET | Freq: Four times a day (QID) | ORAL | Status: DC | PRN

## 2024-09-25 MED ORDER — LIDOCAINE 2% (20 MG/ML) 5 ML SYRINGE
INTRAMUSCULAR | Status: DC | PRN
  Administered 2024-09-25: 80 mg via INTRAVENOUS

## 2024-09-25 MED ORDER — CEFAZOLIN SODIUM-DEXTROSE 2-4 GM/100ML-% IV SOLN
2.0000 g | INTRAVENOUS | Status: AC
  Administered 2024-09-25: 2 g via INTRAVENOUS
  Filled 2024-09-25: qty 100

## 2024-09-25 MED ORDER — PROPOFOL 10 MG/ML IV BOLUS
INTRAVENOUS | Status: AC
  Filled 2024-09-25: qty 20

## 2024-09-25 MED ORDER — FENTANYL CITRATE (PF) 100 MCG/2ML IJ SOLN
INTRAMUSCULAR | Status: DC | PRN
  Administered 2024-09-25: 50 ug via INTRAVENOUS

## 2024-09-25 MED ORDER — ACETAMINOPHEN 325 MG PO TABS: 650.0000 mg | ORAL_TABLET | ORAL | Status: DC | PRN

## 2024-09-25 MED ORDER — ONDANSETRON HCL 4 MG/2ML IJ SOLN: 4.0000 mg | Freq: Once | INTRAMUSCULAR | Status: DC | PRN

## 2024-09-25 MED ORDER — PREGABALIN 75 MG PO CAPS
300.0000 mg | ORAL_CAPSULE | Freq: Every day | ORAL | Status: DC
  Administered 2024-09-25 – 2024-09-26 (×2): 300 mg via ORAL
  Filled 2024-09-25 (×2): qty 4

## 2024-09-25 MED ORDER — INSULIN GLARGINE-YFGN 100 UNIT/ML ~~LOC~~ SOLN
20.0000 [IU] | Freq: Every day | SUBCUTANEOUS | Status: DC
  Administered 2024-09-25 – 2024-09-26 (×2): 20 [IU] via SUBCUTANEOUS
  Filled 2024-09-25 (×3): qty 0.2

## 2024-09-25 MED ORDER — DOCUSATE SODIUM 100 MG PO CAPS
100.0000 mg | ORAL_CAPSULE | Freq: Two times a day (BID) | ORAL | Status: DC
  Administered 2024-09-25 – 2024-09-27 (×4): 100 mg via ORAL
  Filled 2024-09-25 (×5): qty 1

## 2024-09-25 MED ORDER — FENTANYL CITRATE (PF) 100 MCG/2ML IJ SOLN: 25.0000 ug | INTRAMUSCULAR | Status: DC | PRN

## 2024-09-25 MED ORDER — VANCOMYCIN HCL 1000 MG IV SOLR
INTRAVENOUS | Status: DC | PRN
  Administered 2024-09-25: 1000 mg

## 2024-09-25 MED ORDER — MIDAZOLAM HCL 5 MG/5ML IJ SOLN
INTRAMUSCULAR | Status: DC | PRN
  Administered 2024-09-25: 1 mg via INTRAVENOUS

## 2024-09-25 MED ORDER — METOCLOPRAMIDE HCL 5 MG PO TABS: 5.0000 mg | ORAL_TABLET | Freq: Three times a day (TID) | ORAL | Status: DC | PRN

## 2024-09-25 MED ORDER — PROPOFOL 10 MG/ML IV BOLUS
INTRAVENOUS | Status: DC | PRN
Start: 2024-09-25 — End: 2024-09-25
  Administered 2024-09-25: 120 mg via INTRAVENOUS

## 2024-09-25 MED ORDER — OXYCODONE HCL 5 MG PO TABS
10.0000 mg | ORAL_TABLET | Freq: Four times a day (QID) | ORAL | Status: DC | PRN
  Administered 2024-09-25 – 2024-09-26 (×3): 10 mg via ORAL
  Filled 2024-09-25 (×3): qty 2

## 2024-09-25 MED ORDER — TOBRAMYCIN SULFATE 1.2 G IJ SOLR
INTRAMUSCULAR | Status: AC
  Filled 2024-09-25: qty 1.2

## 2024-09-25 MED ORDER — 0.9 % SODIUM CHLORIDE (POUR BTL) OPTIME
TOPICAL | Status: DC | PRN
  Administered 2024-09-25: 1000 mL

## 2024-09-25 MED ORDER — CHLORHEXIDINE GLUCONATE 0.12 % MT SOLN
15.0000 mL | Freq: Once | OROMUCOSAL | Status: AC
  Administered 2024-09-25: 15 mL via OROMUCOSAL
  Filled 2024-09-25: qty 15

## 2024-09-25 MED ORDER — PHENYLEPHRINE 80 MCG/ML (10ML) SYRINGE FOR IV PUSH (FOR BLOOD PRESSURE SUPPORT)
PREFILLED_SYRINGE | INTRAVENOUS | Status: DC | PRN
  Administered 2024-09-25 (×3): 80 ug via INTRAVENOUS

## 2024-09-25 MED ORDER — VASHE WOUND IRRIGATION OPTIME
TOPICAL | Status: DC | PRN
  Administered 2024-09-25: 34 [oz_av]

## 2024-09-25 MED ORDER — VANCOMYCIN HCL 1000 MG IV SOLR
INTRAVENOUS | Status: AC
  Filled 2024-09-25: qty 20

## 2024-09-25 MED ORDER — MIDAZOLAM HCL 2 MG/2ML IJ SOLN
INTRAMUSCULAR | Status: AC
  Filled 2024-09-25: qty 2

## 2024-09-25 MED ORDER — ONDANSETRON HCL 4 MG/2ML IJ SOLN: 4.0000 mg | Freq: Four times a day (QID) | INTRAMUSCULAR | Status: DC | PRN

## 2024-09-25 MED ORDER — METOCLOPRAMIDE HCL 5 MG/ML IJ SOLN: 5.0000 mg | Freq: Three times a day (TID) | INTRAMUSCULAR | Status: DC | PRN

## 2024-09-25 MED ORDER — LIDOCAINE 2% (20 MG/ML) 5 ML SYRINGE
INTRAMUSCULAR | Status: AC
Start: 2024-09-25 — End: 2024-09-25
  Filled 2024-09-25: qty 5

## 2024-09-25 SURGICAL SUPPLY — 45 items
BAG COUNTER SPONGE SURGICOUNT (BAG) ×3 IMPLANT
BLADE AVERAGE 25X9 (BLADE) IMPLANT
BLADE SAW THK.89X75X18XSGTL (BLADE) IMPLANT
BNDG COHESIVE 6X5 TAN ST LF (GAUZE/BANDAGES/DRESSINGS) IMPLANT
BNDG COMPR ESMARK 4X3 LF (GAUZE/BANDAGES/DRESSINGS) ×3 IMPLANT
BNDG GAUZE DERMACEA FLUFF 4 (GAUZE/BANDAGES/DRESSINGS) IMPLANT
CANISTER WOUNDNEG PRESSURE 500 (CANNISTER) IMPLANT
CORD BIPOLAR FORCEPS 12FT (ELECTRODE) ×3 IMPLANT
COVER SURGICAL LIGHT HANDLE (MISCELLANEOUS) ×6 IMPLANT
CUFF TRNQT CYL 34X4.125X (TOURNIQUET CUFF) IMPLANT
DRAPE INCISE IOBAN 66X45 STRL (DRAPES) IMPLANT
DRAPE OEC MINIVIEW 54X84 (DRAPES) IMPLANT
DRAPE U-SHAPE 47X51 STRL (DRAPES) ×3 IMPLANT
DRESSING PEEL AND PLC PRVNA 13 (GAUZE/BANDAGES/DRESSINGS) IMPLANT
DRSG ADAPTIC 3X8 NADH LF (GAUZE/BANDAGES/DRESSINGS) IMPLANT
DRSG VAC PEEL AND PLACE LRG (GAUZE/BANDAGES/DRESSINGS) IMPLANT
DRSG XEROFORM 1X8 (GAUZE/BANDAGES/DRESSINGS) IMPLANT
DURAPREP 26ML APPLICATOR (WOUND CARE) ×3 IMPLANT
ELECTRODE REM PT RTRN 9FT ADLT (ELECTROSURGICAL) ×3 IMPLANT
GAUZE SPONGE 4X4 12PLY STRL LF (GAUZE/BANDAGES/DRESSINGS) IMPLANT
GLOVE BIOGEL PI IND STRL 9 (GLOVE) ×3 IMPLANT
GLOVE SURG ORTHO 9.0 STRL STRW (GLOVE) ×3 IMPLANT
GOWN STRL REUS W/ TWL XL LVL3 (GOWN DISPOSABLE) ×6 IMPLANT
GRAFT SKIN WND MICRO 38 (Tissue) IMPLANT
KIT BASIN OR (CUSTOM PROCEDURE TRAY) ×3 IMPLANT
KIT TURNOVER KIT B (KITS) ×3 IMPLANT
MANIFOLD NEPTUNE II (INSTRUMENTS) ×3 IMPLANT
NDL HYPO 25GX1X1/2 BEV (NEEDLE) IMPLANT
NEEDLE HYPO 25GX1X1/2 BEV (NEEDLE) IMPLANT
PACK ORTHO EXTREMITY (CUSTOM PROCEDURE TRAY) ×3 IMPLANT
PAD ARMBOARD POSITIONER FOAM (MISCELLANEOUS) ×6 IMPLANT
PAD CAST 4YDX4 CTTN HI CHSV (CAST SUPPLIES) IMPLANT
PADDING CAST SYNTHETIC 3X4 NS (CAST SUPPLIES) IMPLANT
SOLN 0.9% NACL 1000 ML (IV SOLUTION) ×3 IMPLANT
SOLN 0.9% NACL POUR BTL 1000ML (IV SOLUTION) ×3 IMPLANT
SOLN STERILE WATER 1000 ML (IV SOLUTION) ×3 IMPLANT
SOLN STERILE WATER BTL 1000 ML (IV SOLUTION) ×3 IMPLANT
SPECIMEN JAR SMALL (MISCELLANEOUS) ×3 IMPLANT
SUCTION TUBE FRAZIER 10FR DISP (SUCTIONS) IMPLANT
SUT ETHILON 2 0 PSLX (SUTURE) IMPLANT
SUT VIC AB 2-0 FS1 27 (SUTURE) IMPLANT
SYR CONTROL 10ML LL (SYRINGE) IMPLANT
TOWEL GREEN STERILE (TOWEL DISPOSABLE) ×3 IMPLANT
TOWEL GREEN STERILE FF (TOWEL DISPOSABLE) ×3 IMPLANT
TUBE CONNECTING 12X1/4 (SUCTIONS) IMPLANT

## 2024-09-25 NOTE — TOC Initial Note (Signed)
 Transition of Care (TOC) - Initial/Assessment Note   Spoke to patient at bedside. From home with spouse.   PT evaluate ordered await recommendations.   Patient already has rolling walker at home, no other DME.   Patient states she is active with [facility] for [facility] and would like to keep service. Will need MD orders for home health .   Currently wound care is peel in place VAC on left and dry dressing to right.   NCM left Artavia with [facility] a message   Inpatient Case Management will continue to follow for discharge needs.  Patient Details  Name: Rhonda Thomas MRN: [id number] Date of Birth: 08/01/1956  Transition of Care [facility]) CM/SW Contact:    Stephane Powell Jansky, RN Phone Number: 09/25/2024, 3:03 PM  Clinical Narrative:                   Expected Discharge Plan: Home w Home Health Services Barriers to Discharge: Continued Medical Work up   Patient Goals and CMS Choice Patient states their goals for this hospitalization and ongoing recovery are:: to return to home CMS Medicare.gov Compare Post Acute Care list provided to::  (see note)        Expected Discharge Plan and Services   Discharge Planning Services: CM Consult   Living arrangements for the past 2 months: Single Family Home                 DME Arranged: N/A (see note) DME Agency: NA       HH Arranged:  (see note)          Prior Living Arrangements/Services Living arrangements for the past 2 months: Single Family Home Lives with:: Spouse Patient language and need for interpreter reviewed:: Yes Do you feel safe going back to the place where you live?: Yes      Need for Family Participation in Patient Care: Yes (Comment) Care giver support system in place?: Yes (comment) Current home services: DME Criminal Activity/Legal Involvement Pertinent to Current Situation/Hospitalization: No - Comment as needed  Activities of Daily Living   ADL Screening (condition at time of  admission) Independently performs ADLs?: Yes (appropriate for developmental age) Is the patient deaf or have difficulty hearing?: No Does the patient have difficulty seeing, even when wearing glasses/contacts?: No Does the patient have difficulty concentrating, remembering, or making decisions?: No  Permission Sought/Granted   Permission granted to share information with : Yes, Verbal Permission Granted  Share Information with NAME: [name]  Permission granted to share info w AGENCY: [facility]        Emotional Assessment Appearance:: Appears stated age Attitude/Demeanor/Rapport: Engaged Affect (typically observed): Appropriate Orientation: : Oriented to Self, Oriented to Place, Oriented to  Time, Oriented to Situation Alcohol / Substance Use: Not Applicable Psych Involvement: No (comment)  Admission diagnosis:  Osteomyelitis of ankle and foot (HCC) [M86.9] Wound, open with complication [T14.8XXA] Patient Active Problem List   Diagnosis Date Noted   Osteomyelitis of great toe of right foot (HCC) 09/25/2024   Acute osteomyelitis of left calcaneus (HCC) 09/25/2024   Wound, open with complication 09/25/2024   Diabetes mellitus (HCC) 10/28/2022   Nuclear sclerotic cataract of both eyes 05/05/2022   Personal history of diabetic foot ulcer 08/10/2019   Bilateral carpal tunnel syndrome 02/25/2019   Major depressive disorder, recurrent episode, moderate (HCC) 02/09/2018   Diabetic peripheral neuropathy (HCC) 11/07/2017   Vitamin B12 deficiency 11/07/2017   DDD (degenerative disc disease), lumbosacral 10/02/2017  Overweight (BMI 25.0-29.9) 10/24/2016   Somnolence, daytime 03/10/2015   Generalized anxiety disorder 06/21/2013   Attention deficit disorder 06/21/2013   Hyperlipidemia 06/21/2013   Restless legs syndrome (RLS) 06/20/2013   Mild nonproliferative diabetic retinopathy of both eyes (HCC) 06/20/2013   PCP:  Dettinger, Fonda LABOR, MD Pharmacy:   [facility] - [place], [place] - [address] [place] [address] Phone: [phone] Fax: [phone]  [facility] [place], [place] - [address] [place] [address] Phone: [phone] Fax: [phone]  [facility] - [facility] [address] [place] [address] Phone: [phone] Fax: [phone]     Social Drivers of Health (SDOH) Social History: SDOH Screenings   Food Insecurity: No Food Insecurity (09/25/2024)  Housing: Low Risk  (09/25/2024)  Transportation Needs: No Transportation Needs (09/25/2024)  Utilities: Not At Risk (09/25/2024)  Alcohol Screen: Low Risk  (11/01/2023)  Depression (PHQ2-9): Low Risk  (08/22/2024)  Financial Resource Strain: Low Risk  (04/17/2024)   Received from [facility]  Physical Activity: Sufficiently Active (11/01/2023)  Social Connections: Moderately Integrated (09/25/2024)  Stress: No Stress Concern Present (06/05/2024)   Received from [facility]  Tobacco Use: Low Risk  (09/25/2024)  Health Literacy: Adequate Health Literacy (11/01/2023)   SDOH Interventions:     Readmission Risk Interventions     No data to display

## 2024-09-25 NOTE — Care Management CC44 (Signed)
 Condition Code 44 Documentation Completed  Patient Details  Name: Rhonda Thomas MRN: [id number] Date of Birth: May 20, 1956   Condition Code 44 given:  Yes Patient signature on Condition Code 44 notice:  Yes Documentation of 2 MD's agreement:  Yes Code 44 added to claim:  Yes    Nena LITTIE Coffee, RN 09/25/2024, 5:28 PM

## 2024-09-25 NOTE — Transfer of Care (Signed)
 Immediate Anesthesia Transfer of Care Note  Patient: Rhonda Thomas  Procedure(s) Performed: AMPUTATION, TOE (Right: Toe) EXCISION, METATARSAL BONE, HEAD (Left: Toe) APPLICATION, WOUND VAC  Patient Location: PACU  Anesthesia Type:General  Level of Consciousness: awake, alert , oriented, and patient cooperative  Airway & Oxygen Therapy: Patient Spontanous Breathing and Patient connected to face mask oxygen  Post-op Assessment: Report given to RN and Post -op Vital signs reviewed and stable  Post vital signs: Reviewed and stable  Last Vitals:  Vitals Value Taken Time  BP 129/66 09/25/24 11:19  Temp    Pulse 74 09/25/24 11:23  Resp 10 09/25/24 11:23  SpO2 98 % 09/25/24 11:23  Vitals shown include unfiled device data.  Last Pain:  Vitals:   09/25/24 0851  TempSrc:   PainSc: 0-No pain      Patients Stated Pain Goal: 0 (09/25/24 0835)  Complications: There were no known notable events for this encounter.

## 2024-09-25 NOTE — Interval H&P Note (Signed)
 History and Physical Interval Note:  09/25/2024 8:51 AM  Rhonda Thomas  has presented today for surgery, with the diagnosis of Osteomyelitis Left Calcaneous and Right Great Toe.  The various methods of treatment have been discussed with the patient and family. After consideration of risks, benefits and other options for treatment, the patient has consented to  Procedure(s) with comments: AMPUTATION, TOE (Right) - RIGHT GREAT TOE AMPUTATION EXCISION, METATARSAL BONE, HEAD (Left) - LEFT PARTIAL CALCANEOUS EXCISION APPLICATION, WOUND VAC (N/A) as a surgical intervention.  The patient's history has been reviewed, patient examined, no change in status, stable for surgery.  I have reviewed the patient's chart and labs.  Questions were answered to the patient's satisfaction.     [name]

## 2024-09-25 NOTE — Care Management Obs Status (Signed)
 MEDICARE OBSERVATION STATUS NOTIFICATION   Patient Details  Name: Rhonda Thomas MRN: [id number] Date of Birth: 1956/04/21   Medicare Observation Status Notification Given:  Yes    Nena LITTIE Coffee, RN 09/25/2024, 5:27 PM

## 2024-09-25 NOTE — Op Note (Signed)
 09/25/2024  11:04 AM  [name]    PRE-OPERATIVE DIAGNOSIS:  Osteomyelitis Left Calcaneous and Right Great Toe  POST-OPERATIVE DIAGNOSIS:  Same  PROCEDURE:   1.  Amputation of right foot first ray. 2.  Partial left calcaneal excision. 3. Local tissue transfer for wound closure on the left 10 x 4 cm. 4.  Application of antibiotics to the right and left foot wound total of 1 g vancomycin powder and 1.2 g tobramycin powder 5. application of Kerecis micro graft 38 cm for local tissue reinforcement left heel. 6.  Application of peel in place wound VAC sponge on the left, dry dressing on the right 7.  Soft tissue left heel sent for cultures  SURGEON:  Jerona LULLA Sage, MD  PHYSICIAN ASSISTANT:None ANESTHESIA:   General  PREOPERATIVE INDICATIONS:  Rhonda Thomas is a  68 y.o. female with a diagnosis of Osteomyelitis Left Calcaneous and Right Great Toe who failed conservative measures and elected for surgical management.    The risks benefits and alternatives were discussed with the patient preoperatively including but not limited to the risks of infection, bleeding, nerve injury, cardiopulmonary complications, the need for revision surgery, among others, and the patient was willing to proceed.  OPERATIVE IMPLANTS:   Implant Name Type Inv. Item Serial No. Manufacturer Lot No. LRB No. Used Action  GRAFT SKIN WND MICRO 38 - [id number] Tissue GRAFT SKIN WND MICRO 38  [employer] [id number] Left 1 Implanted    @ENCIMAGES @  OPERATIVE FINDINGS: Tissue margins were clear.  The soft tissue from the left heel ulcer was sent for cultures  OPERATIVE PROCEDURE: Patient is brought the operating room and underwent a general LMA anesthetic.  After adequate levels anesthesia were obtained patient's both lower extremities were prepped using DuraPrep draped into a sterile field a timeout was called.  A racquet incision was made around the ulcer of the right great toe this was  carried down to the MTP joint and the great toe was amputated through the MTP joint.  There was insufficient soft tissue for wound closure and the ray was resected through the metatarsal neck to allow for wound closure.  The wound was irrigated with Vashe electrocautery was used for hemostasis.  The incision was closed using 2-0 nylon.  The wound bed was filled with part of the 1 g vancomycin powder and 1.2 g tobramycin powder.  Attention was then focused on the left lower extremity a elliptical incision was made around the ulcerative tissue on the calcaneus this left a wound that was 4 x 10 cm.  A oscillating saw was reused to remove the distal 2 cm of the calcaneus.  The calcaneal tissue margins were clear the soft tissue margins were clear.  The ulcerative tissue was sent for cultures.  Electrocautery was used hemostasis the wound was irrigated with Vashe.  The tissue margins were undermined and local tissue transfer was used to close the wound 10 x 4 cm with 2-0 nylon.  A peel in place wound VAC sponge was applied on the left this had a good suction fit a dry dressing was applied on the right patient was extubated taken the PACU in stable condition   DISCHARGE PLANNING:  Antibiotic duration: Kefzol  24 hours  Weightbearing: Nonweightbearing on the left weightbearing as tolerated on the right  Pain medication: Opioid pathway  Dressing care/ Wound VAC: Wound VAC on the left  Ambulatory devices: Walker  Discharge to: Anticipate discharge to home  Follow-up: In  the office 1 week post operative.

## 2024-09-25 NOTE — Anesthesia Procedure Notes (Signed)
 Procedure Name: LMA Insertion Date/Time: 09/25/2024 10:19 AM  Performed by: Erick Fitz, CRNAPre-anesthesia Checklist: Patient identified, Emergency Drugs available, Suction available, Patient being monitored and Timeout performed Patient Re-evaluated:Patient Re-evaluated prior to induction Oxygen Delivery Method: Circle system utilized Preoxygenation: Pre-oxygenation with 100% oxygen Induction Type: IV induction Ventilation: Mask ventilation without difficulty LMA: LMA with gastric port inserted Number of attempts: 1 Placement Confirmation: positive ETCO2, CO2 detector and breath sounds checked- equal and bilateral Tube secured with: Tape (secured with 1/2 inch white silk tape) Dental Injury: Teeth and Oropharynx as per pre-operative assessment

## 2024-09-25 NOTE — Anesthesia Postprocedure Evaluation (Signed)
 Anesthesia Post Note  Patient: Rhonda Thomas  Procedure(s) Performed: AMPUTATION, TOE (Right: Toe) EXCISION, METATARSAL BONE, HEAD (Left: Toe) APPLICATION, WOUND VAC     Patient location during evaluation: PACU Anesthesia Type: General Level of consciousness: awake and alert Pain management: pain level controlled Vital Signs Assessment: post-procedure vital signs reviewed and stable Respiratory status: spontaneous breathing, nonlabored ventilation and respiratory function stable Cardiovascular status: blood pressure returned to baseline and stable Postop Assessment: no apparent nausea or vomiting Anesthetic complications: no   There were no known notable events for this encounter.  Last Vitals:  Vitals:   09/25/24 1200 09/25/24 1233  BP: (!) 98/57 109/76  Pulse: 68 64  Resp: 10 10  Temp: 36.9 C 36.7 C  SpO2: 99% 95%    Last Pain:  Vitals:   09/25/24 1233  TempSrc: Oral  PainSc:                  [name]

## 2024-09-25 NOTE — Anesthesia Preprocedure Evaluation (Signed)
 Anesthesia Evaluation  Patient identified by MRN, date of birth, ID band Patient awake    Reviewed: Allergy & Precautions, NPO status , Patient's Chart, lab work & pertinent test results  Airway Mallampati: I  TM Distance: >3 FB Neck ROM: Full    Dental  (+) Teeth Intact, Dental Advisory Given   Pulmonary sleep apnea    Pulmonary exam normal breath sounds clear to auscultation       Cardiovascular (-) angina (-) Past MI negative cardio ROS Normal cardiovascular exam Rhythm:Regular Rate:Normal     Neuro/Psych  PSYCHIATRIC DISORDERS Anxiety Depression     Neuromuscular disease    GI/Hepatic negative GI ROS, Neg liver ROS,,,  Endo/Other  diabetes, Type 2, Oral Hypoglycemic Agents, Insulin  Dependent    Renal/GU negative Renal ROS     Musculoskeletal Osteomyelitis Left Calcaneous and Right Great Toe   Abdominal   Peds  (+) ATTENTION DEFICIT DISORDER WITHOUT HYPERACTIVITY Hematology  (+) Blood dyscrasia, anemia   Anesthesia Other Findings Day of surgery medications reviewed with the patient.  Reproductive/Obstetrics                              Anesthesia Physical Anesthesia Plan  ASA: 3  Anesthesia Plan: General   Post-op Pain Management: Tylenol  PO (pre-op)* and Toradol IV (intra-op)*   Induction: Intravenous  PONV Risk Score and Plan: 3 and Dexamethasone  and Ondansetron   Airway Management Planned: LMA  Additional Equipment:   Intra-op Plan:   Post-operative Plan: Extubation in OR  Informed Consent: I have reviewed the patients History and Physical, chart, labs and discussed the procedure including the risks, benefits and alternatives for the proposed anesthesia with the patient or authorized representative who has indicated his/her understanding and acceptance.     Dental advisory given  Plan Discussed with: CRNA and Anesthesiologist  Anesthesia Plan Comments:          Anesthesia Quick Evaluation

## 2024-09-25 NOTE — Progress Notes (Signed)
 Orthopedic Tech Progress Note Patient Details:  Rhonda Thomas 22-Feb-1956 [id number]  Ortho Devices Type of Ortho Device: Prafo boot/shoe Ortho Device/Splint Location: LLE/ Pt. already has POS.Advised PRAFO is for in bed only. Ortho Device/Splint Interventions: Ordered, Application, Adjustment   Post Interventions Patient Tolerated: Well Instructions Provided: Adjustment of device  Rhonda Thomas 09/25/2024, 1:18 PM

## 2024-09-25 NOTE — Plan of Care (Signed)

## 2024-09-26 ENCOUNTER — Encounter (HOSPITAL_COMMUNITY): Payer: Self-pay | Admitting: Orthopedic Surgery

## 2024-09-26 DIAGNOSIS — M86172 Other acute osteomyelitis, left ankle and foot: Secondary | ICD-10-CM | POA: Diagnosis not present

## 2024-09-26 MED ORDER — CEFAZOLIN SODIUM-DEXTROSE 1-4 GM/50ML-% IV SOLN
1.0000 g | Freq: Three times a day (TID) | INTRAVENOUS | Status: AC
  Administered 2024-09-26 (×3): 1 g via INTRAVENOUS
  Filled 2024-09-26 (×3): qty 50

## 2024-09-26 NOTE — Progress Notes (Signed)
 Orthopedic Tech Progress Note Patient Details:  Rhonda Thomas [birth year] [id number]  Ortho Devices Type of Ortho Device: Postop shoe/boot Ortho Device/Splint Location: RLE Ortho Device/Splint Interventions: Application, Ordered   Post Interventions Patient Tolerated: Well Instructions Provided: Adjustment of device  Huntley Knoop A Ellarie Picking 09/26/2024, 4:46 PM

## 2024-09-26 NOTE — Plan of Care (Signed)
  Problem: Education: Goal: Knowledge of General Education information will improve Description: Including pain rating scale, medication(s)/side effects and non-pharmacologic comfort measures Outcome: Progressing   Problem: Health Behavior/Discharge Planning: Goal: Ability to manage health-related needs will improve Outcome: Progressing   Problem: Activity: Goal: Risk for activity intolerance will decrease Outcome: Progressing   Problem: Nutrition: Goal: Adequate nutrition will be maintained Outcome: Progressing   Problem: Coping: Goal: Level of anxiety will decrease Outcome: Progressing   Problem: Pain Managment: Goal: General experience of comfort will improve and/or be controlled Outcome: Progressing

## 2024-09-26 NOTE — Progress Notes (Signed)
 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  Right first ray Amputation of right foot first ray.   Antibiotic duration: Kefzol  24 hours   Weightbearing: Nonweightbearing on the left PRAFO boot to float the heel, weightbearing as tolerated on the right   Pain medication: Opioid pathway   Dressing care/ Wound VAC: Wound VAC on the left   Ambulatory devices: Walker   Discharge to: Anticipate discharge to home   Follow-up: In the office 1 week post operative. She needs 24 hours of ancef  this was ordered this am.  She did receive pre-op antibiotics.  PT eval for safety and mobility Plan for discharge tomorrow  Maurilio Deland Collet PA-C

## 2024-09-26 NOTE — TOC Progression Note (Signed)
 Transition of Care (TOC) - Progression Note   Inpatient Care Management Team continues to follow. Await PT eval once post op shoe has been delivered    Patient Details  Name: Rhonda Thomas MRN: [id number] Date of Birth: 09-Nov-1956  Transition of Care [facility]) CM/SW Contact  Stephenie Navejas, Powell Jansky, RN Phone Number: 09/26/2024, 3:32 PM  Clinical Narrative:       Expected Discharge Plan: Home w Home Health Services Barriers to Discharge: Continued Medical Work up               Expected Discharge Plan and Services   Discharge Planning Services: CM Consult   Living arrangements for the past 2 months: Single Family Home                 DME Arranged: N/A (see note) DME Agency: NA       HH Arranged:  (see note)           Social Drivers of Health (SDOH) Interventions SDOH Screenings   Food Insecurity: No Food Insecurity (09/25/2024)  Housing: Low Risk  (09/25/2024)  Transportation Needs: No Transportation Needs (09/25/2024)  Utilities: Not At Risk (09/25/2024)  Alcohol Screen: Low Risk  (11/01/2023)  Depression (PHQ2-9): Low Risk  (08/22/2024)  Financial Resource Strain: Low Risk  (04/17/2024)   Received from [facility]  Physical Activity: Sufficiently Active (11/01/2023)  Social Connections: Moderately Integrated (09/25/2024)  Stress: No Stress Concern Present (06/05/2024)   Received from [facility]  Tobacco Use: Low Risk  (09/25/2024)  Health Literacy: Adequate Health Literacy (11/01/2023)    Readmission Risk Interventions     No data to display

## 2024-09-26 NOTE — Progress Notes (Signed)
 Per order NS KVO when tolerating PO meds. Patient has eaten and taken PO meds with not nausea or vomiting. All tolerated well.

## 2024-09-26 NOTE — TOC CM/SW Note (Signed)
    Durable Medical Equipment  (From admission, onward)           Start     Ordered   09/26/24 1526  For home use only DME 3 n 1  Once        09/26/24 1527   09/26/24 1526  For home use only DME standard manual wheelchair with seat cushion  Once       Comments: Patient suffers from Left calcaneal osteomyelitis s/p Partial left calcaneal excision. Right first ray Amputation of right foot first ray.  which impairs their ability to perform daily activities like ambulating  in the home.  A cane  will not resolve issue with performing activities of daily living. A wheelchair will allow patient to safely perform daily activities. Patient can safely propel the wheelchair in the home or has a caregiver who can provide assistance. Length of need lifetime . Accessories: elevating leg rests (ELRs), wheel locks, extensions and anti-tippers.  Seat and back cushions   09/26/24 1527   09/25/24 1126  For home use only DME Walker  Once       Question:  Patient needs a walker to treat with the following condition  Answer:  Post-operative state   09/25/24 1126           Patient needs bedside commode/ 3 in1 due to being confined to a room with no bathroom

## 2024-09-26 NOTE — Evaluation (Signed)
 Occupational Therapy Evaluation Patient Details Name: Rhonda Thomas MRN: [id number] DOB: Jan 07, 1956 Today's Date: 09/26/2024   History of Present Illness   Pt isa 68 year old female who presented to [facility] 09/25/24 with L calcaneous and R great toe osteomyelitis. S/P R foot first ray amputation and L partial calcaneous excision with application of wound vac 09/25/24. PMH: ADD, anxiety, depression, chronic foot pain, DM, neuropathy, RLS, HLD, OSA.     Clinical Impressions Pt was functioning modified independently prior to admission. Reports she has struggled in the past maintaining NWB with various wounds she has experienced. Pt presents with impaired standing balance with sit to stand and transfers. Did not attempt hopping in the absence of R post op shoe, [employer] notified to deliver. Instructed pt in shower transfer with RW, maintaining NWB. Educated to perform ADLs in sitting, leaning side to side and avoid attempt to stand and maintain balance on one foot. Pt transferred with CGA from bed to recliner with RW. She needs set up to min assist for ADLs. Educated in use of BSC beside bed and over the top of her toilet if she is able to hop the 10 feet necessary to access her bathroom maintaining WB status as it is not w/c accessible. Pt verbalized understanding. Will follow to reinforce.      If plan is discharge home, recommend the following:   A little help with walking and/or transfers;A little help with bathing/dressing/bathroom;Assistance with cooking/housework;Assist for transportation;Help with stairs or ramp for entrance     Functional Status Assessment   Patient has had a recent decline in their functional status and demonstrates the ability to make significant improvements in function in a reasonable and predictable amount of time.     Equipment Recommendations   BSC/3in1;Wheelchair (measurements OT);Wheelchair cushion (measurements OT)     Recommendations for Other  Services         Precautions/Restrictions   Precautions Precautions: Fall Recall of Precautions/Restrictions: Intact Required Braces or Orthoses: Other Brace Other Brace: L PRAFO in bed, R post op shoe Restrictions Weight Bearing Restrictions Per Provider Order: Yes RLE Weight Bearing Per Provider Order: Weight bearing as tolerated (in post op shoe) LLE Weight Bearing Per Provider Order: Non weight bearing     Mobility Bed Mobility               General bed mobility comments: EOB upon arrival    Transfers Overall transfer level: Needs assistance Equipment used: Rolling walker (2 wheels) Transfers: Sit to/from Stand Sit to Stand: Contact guard assist           General transfer comment: consistently requires 2 attempts to stand, monitored NWB with OT's foot under her L foot      Balance Overall balance assessment: Needs assistance   Sitting balance-Leahy Scale: Good     Standing balance support: Bilateral upper extremity supported Standing balance-Leahy Scale: Poor                             ADL either performed or assessed with clinical judgement   ADL Overall ADL's : Needs assistance/impaired Eating/Feeding: Independent;Sitting   Grooming: Set up;Sitting   Upper Body Bathing: Set up;Sitting   Lower Body Bathing: Sitting/lateral leans;Supervison/ safety   Upper Body Dressing : Set up;Sitting   Lower Body Dressing: Minimal assistance;Sitting/lateral leans Lower Body Dressing Details (indicate cue type and reason): R sock Toilet Transfer: Contact guard assist;Stand-pivot;Rolling walker (2 wheels);BSC/3in1   Toileting-  Clothing Manipulation and Hygiene: Set up;Sitting/lateral lean               Vision Ability to See in Adequate Light: 0 Adequate Patient Visual Report: No change from baseline       Perception         Praxis         Pertinent Vitals/Pain Pain Assessment Pain Assessment: Faces Faces Pain Scale: No  hurt Pain Intervention(s): Monitored during session     Extremity/Trunk Assessment Upper Extremity Assessment Upper Extremity Assessment: Overall WFL for tasks assessed   Lower Extremity Assessment Lower Extremity Assessment: Defer to PT evaluation   Cervical / Trunk Assessment Cervical / Trunk Assessment: Normal   Communication Communication Communication: No apparent difficulties   Cognition Arousal: Alert Behavior During Therapy: WFL for tasks assessed/performed Cognition: No apparent impairments                               Following commands: Intact       Cueing  General Comments   Cueing Techniques: Verbal cues;Tactile cues      Exercises     Shoulder Instructions      Home Living Family/patient expects to be discharged to:: Private residence Living Arrangements: Spouse/significant other Available Help at Discharge: Family;Available 24 hours/day Type of Home: House Home Access: Stairs to enter [employer] of Steps: 4 Entrance Stairs-Rails: None Home Layout: One level     Bathroom Shower/Tub: Producer, television/film/video: Standard     Home Equipment: Agricultural consultant (2 wheels);Shower seat;Hand held shower head          Prior Functioning/Environment Prior Level of Function : Needs assist             Mobility Comments: walking with RW, not always able to adhere to NWB in the past ADLs Comments: Mod I for self care, worked wiht her husband on IADLs    OT Problem List: Impaired balance (sitting and/or standing);Decreased knowledge of use of DME or AE;Impaired sensation   OT Treatment/Interventions: Self-care/ADL training;Therapeutic activities;DME and/or AE instruction;Patient/family education;Balance training      OT Goals(Current goals can be found in the care plan section)   Acute Rehab OT Goals OT Goal Formulation: With patient Time For Goal Achievement: 10/10/24 Potential to Achieve Goals: Good ADL  Goals Pt Will Perform Lower Body Bathing: sitting/lateral leans;with modified independence Pt Will Perform Lower Body Dressing: sitting/lateral leans;with modified independence Pt Will Transfer to Toilet: with supervision;ambulating;bedside commode (over toilet) Pt Will Perform Toileting - Clothing Manipulation and hygiene: sitting/lateral leans Pt Will Perform Tub/Shower Transfer: Shower transfer;with supervision;ambulating;shower seat;rolling walker   OT Frequency:  Min 2X/week    Co-evaluation              AM-PAC OT 6 Clicks Daily Activity     Outcome Measure Help from another person eating meals?: None Help from another person taking care of personal grooming?: A Little Help from another person toileting, which includes using toliet, bedpan, or urinal?: A Little Help from another person bathing (including washing, rinsing, drying)?: A Little Help from another person to put on and taking off regular upper body clothing?: A Little Help from another person to put on and taking off regular lower body clothing?: A Little 6 Click Score: 19   End of Session Equipment Utilized During Treatment: Gait belt;Rolling walker (2 wheels)  Activity Tolerance: Patient tolerated treatment well Patient left: in chair;with call  bell/phone within reach;with family/visitor present  OT Visit Diagnosis: Unsteadiness on feet (R26.81);Muscle weakness (generalized) (M62.81)                Time: 8780-8749 OT Time Calculation (min): 31 min Charges:  OT General Charges $OT Visit: 1 Visit OT Evaluation $OT Eval Moderate Complexity: 1 Mod OT Treatments $Self Care/Home Management : 8-22 mins  Mliss HERO, OTR/L Acute Rehabilitation Services Office: [phone]  Kennth Mliss Helling 09/26/2024, 1:11 PM

## 2024-09-26 NOTE — Evaluation (Signed)
 Physical Therapy Evaluation Patient Details Name: Rhonda Thomas MRN: [id number] DOB: Jul 25, 1956 Today's Date: 09/26/2024  History of Present Illness  Pt is a 68 y.o. female presented to [facility] 09/25/24 for left calcaneous and right great toe osteomyelitis. Pt s/p right foot first ray amputation with wound vac application and L partial calcaneal excision with antibiotic application 10/15. PMHx: ADD, anxiety, depression, chronic foot pain, DM, neuropahy, RLS, hiatal hernia, HLD, and OSA.   Clinical Impression  Pt admitted with above diagnosis. PTA, pt was modI for functional mobility using RW and modI for ADLs. She lives with her husband in a one story house with 3 STE. Pt currently with functional limitations due to the deficits listed below (see PT Problem List). She required supervision for bed mobility, CGA for transfers using RW, and CGA for gait using RW. Pt is currently limited by pain, weight bearing status, decreased balance, and decreased activity tolerance. She maintained LLE NWB in Prafo boot to off load the heel and RLE WBAT in post-op shoe throughout functional mobility. Educated pt on hopping technique to ambulate short-distances using AD. Pt will benefit from acute skilled PT to increase her independence and safety with mobility to allow discharge. Recommend HHPT to increase ROM/strength, improve balance, decrease fall risk, advance activity tolerance, and optimize safety within the home environment.     If plan is discharge home, recommend the following: A little help with walking and/or transfers;A little help with bathing/dressing/bathroom;Assistance with cooking/housework;Assist for transportation;Help with stairs or ramp for entrance   Can travel by private vehicle        Equipment Recommendations Wheelchair (measurements PT);Wheelchair cushion (measurements PT);BSC/3in1  Recommendations for Other Services       Functional Status Assessment Patient has had a recent  decline in their functional status and demonstrates the ability to make significant improvements in function in a reasonable and predictable amount of time.     Precautions / Restrictions Precautions Precautions: Fall Recall of Precautions/Restrictions: Intact Precaution/Restrictions Comments: Wound Vac LLE Required Braces or Orthoses: Other Brace Other Brace: RLE post-op shoe; LLE PRAFO boot Restrictions Weight Bearing Restrictions Per Provider Order: Yes RLE Weight Bearing Per Provider Order: Weight bearing as tolerated LLE Weight Bearing Per Provider Order: Non weight bearing      Mobility  Bed Mobility Overal bed mobility: Needs Assistance Bed Mobility: Supine to Sit, Sit to Supine     Supine to sit: Supervision Sit to supine: Supervision   General bed mobility comments: Pt sat up on L side of bed with increased time. She managed BLE and trunk. Assist for line management.    Transfers Overall transfer level: Needs assistance Equipment used: Rolling walker (2 wheels) Transfers: Sit to/from Stand, Bed to chair/wheelchair/BSC Sit to Stand: Contact guard assist           General transfer comment: Pt stood from lowest bed height. Cued proper hand placement. Instructed pt to keep LLE anterior and hold off ground to prevent weight acceptance. Pt tucked RLE underneath her. She powered up with light assist. Good eccentric control.    Ambulation/Gait Ambulation/Gait assistance: Contact guard assist Gait Distance (Feet): 30 Feet Assistive device: Rolling walker (2 wheels) Gait Pattern/deviations: Step-to pattern, Narrow base of support Gait velocity: decreased Gait velocity interpretation: <1.31 ft/sec, indicative of household ambulator   General Gait Details: Educated pt on gait mechanics to maintain LLE NWB. Demonstrated hopping technique. Heavy reliance on BUE support on RW. Pt took short slow hops. Limited foot clearence from floor. Pt kept  BLE close together and the  velcro of the braces would rub. Encouraged pt to increase L knee flex and keep it behind her when moving.  Stairs            Wheelchair Mobility     Tilt Bed    Modified Rankin (Stroke Patients Only)       Balance Overall balance assessment: Needs assistance Sitting-balance support: No upper extremity supported, Feet supported Sitting balance-Leahy Scale: Fair Sitting balance - Comments: Pt doffed post-op shoe seated EOB with cues   Standing balance support: Bilateral upper extremity supported, During functional activity, Reliant on assistive device for balance Standing balance-Leahy Scale: Poor Standing balance comment: Pt dependent on RW                             Pertinent Vitals/Pain Pain Assessment Pain Assessment: Faces Faces Pain Scale: Hurts a little bit Pain Location: R foot with weight-bearing Pain Descriptors / Indicators: Discomfort, Aching, Sore    Home Living Family/patient expects to be discharged to:: Private residence Living Arrangements: Spouse/significant other Available Help at Discharge: Family;Available 24 hours/day Type of Home: House Home Access: Stairs to enter Entrance Stairs-Rails: None Entrance Stairs-Number of Steps: 3   Home Layout: One level Home Equipment: Agricultural consultant (2 wheels);Shower seat;Hand held shower head;Other (comment) (Knee Scooter)      Prior Function Prior Level of Function : Independent/Modified Independent;Driving             Mobility Comments: Ambulates using RW. Denies fall hx. ADLs Comments: ModI for self care. Husband helps as needed for IADLs. Drives.     Extremity/Trunk Assessment   Upper Extremity Assessment Upper Extremity Assessment: Defer to OT evaluation    Lower Extremity Assessment Lower Extremity Assessment: RLE deficits/detail;LLE deficits/detail RLE Deficits / Details: Pt POD 1 s/p first ray amputation. Decreased ankle, foot/toe AROM and strength. Hip and Knee WFL. RLE:  Unable to fully assess due to pain RLE Sensation: decreased proprioception RLE Coordination: decreased gross motor LLE Deficits / Details: Pt POD 1 s/p partial calcaneal excision. Decreased ankle, foot/toe AROM and strength. Hip and Knee WFL. LLE: Unable to fully assess due to pain LLE Sensation: decreased proprioception LLE Coordination: decreased gross motor    Cervical / Trunk Assessment Cervical / Trunk Assessment: Normal  Communication   Communication Communication: No apparent difficulties    Cognition Arousal: Alert Behavior During Therapy: WFL for tasks assessed/performed   PT - Cognitive impairments: No apparent impairments                       PT - Cognition Comments: Pt A,Ox4 Following commands: Intact       Cueing Cueing Techniques: Verbal cues, Tactile cues     General Comments General comments (skin integrity, edema, etc.): Began PT evaluation and noticed pt did not have post-op shoe. Contacted ortho tech for [employer]. Obtained home set-up, DME, and PLOF. Deferred mobility until post-op shoe was delievered. Re-entered when Ortho Tech arrived to provide show and advanced functional mobility.    Exercises     Assessment/Plan    PT Assessment Patient needs continued PT services  PT Problem List Decreased strength;Decreased range of motion;Decreased activity tolerance;Decreased balance;Decreased mobility;Decreased knowledge of use of DME;Decreased safety awareness;Pain       PT Treatment Interventions DME instruction;Gait training;Stair training;Functional mobility training;Therapeutic activities;Therapeutic exercise;Balance training;Neuromuscular re-education;Patient/family education;Wheelchair mobility training    PT Goals (Current goals can be found  in the Care Plan section)  Acute Rehab PT Goals Patient Stated Goal: Return Home PT Goal Formulation: With patient Time For Goal Achievement: 10/10/24 Potential to Achieve Goals: Good     Frequency Min 2X/week     Co-evaluation               AM-PAC PT 6 Clicks Mobility  Outcome Measure Help needed turning from your back to your side while in a flat bed without using bedrails?: A Little Help needed moving from lying on your back to sitting on the side of a flat bed without using bedrails?: A Little Help needed moving to and from a bed to a chair (including a wheelchair)?: A Little Help needed standing up from a chair using your arms (e.g., wheelchair or bedside chair)?: A Little Help needed to walk in hospital room?: A Little Help needed climbing 3-5 steps with a railing? : A Lot 6 Click Score: 17    End of Session Equipment Utilized During Treatment: Gait belt Activity Tolerance: Patient tolerated treatment well Patient left: in bed;with call bell/phone within reach Nurse Communication: Mobility status PT Visit Diagnosis: Difficulty in walking, not elsewhere classified (R26.2);Other abnormalities of gait and mobility (R26.89);Unsteadiness on feet (R26.81)    Time: 8469-8460; 8353-8343 PT Time Calculation (min) (ACUTE ONLY): 29 min   Charges:   PT Evaluation $PT Eval Moderate Complexity: 1 Mod PT Treatments $Gait Training: 8-22 mins PT General Charges $$ ACUTE PT VISIT: 1 Visit         Randall SAUNDERS, PT, DPT Acute Rehabilitation Services Office: [phone] Secure Chat Preferred  Delon CHRISTELLA Callander 09/26/2024, 5:11 PM

## 2024-09-27 DIAGNOSIS — M86172 Other acute osteomyelitis, left ankle and foot: Secondary | ICD-10-CM | POA: Diagnosis not present

## 2024-09-27 NOTE — Discharge Instructions (Addendum)
 Heel weight bearing right  Lower ext. in post op shoe.  No weight on the left foot.  Wear boot at all times.  Elevate for decreased swelling.  The vac will stay on until your follow up in 1 week.  Keep it clean and dry.

## 2024-09-27 NOTE — TOC Progression Note (Addendum)
 Transition of Care (TOC) - Progression Note   Have ordered wheelchair and bedside commode ( 3 in 1) with Jermaine with [employer], to come to patient's hospital room this morning. Orders and DME progress note need to be signed first. Secure chatted PA   PT recommended home health PT .  Secure chatted PA for order and face to face . See note from yesterday regarding HHRN, awaiting determination . Patient wants to continue with [facility] .Artavia with [facility] aware and will need orders and face to face   Patient aware of all of the above and voiced understanding.   DME orders and note signed. [employer] aware.   Orders received for HHPT. No HHRN needed.  Patient Details  Name: Rhonda Thomas MRN: [id number] Date of Birth: 1956-02-02  Transition of Care [facility]) CM/SW Contact  Rhonda Thomas, Rhonda Jansky, RN Phone Number: 09/27/2024, 8:53 AM  Clinical Narrative:       Expected Discharge Plan: Home w Home Health Services Barriers to Discharge: Continued Medical Work up               Expected Discharge Plan and Services   Discharge Planning Services: CM Consult   Living arrangements for the past 2 months: Single Family Home                 DME Arranged: N/A (see note) DME Agency: NA       HH Arranged:  (see note)           Social Drivers of Health (SDOH) Interventions SDOH Screenings   Food Insecurity: No Food Insecurity (09/25/2024)  Housing: Low Risk  (09/25/2024)  Transportation Needs: No Transportation Needs (09/25/2024)  Utilities: Not At Risk (09/25/2024)  Alcohol Screen: Low Risk  (11/01/2023)  Depression (PHQ2-9): Low Risk  (08/22/2024)  Financial Resource Strain: Low Risk  (04/17/2024)   Received from [facility]  Physical Activity: Sufficiently Active (11/01/2023)  Social Connections: Moderately Integrated (09/25/2024)  Stress: No Stress Concern Present (06/05/2024)   [facility]  Tobacco Use: Low Risk  (09/25/2024)  Health Literacy: Adequate  Health Literacy (11/01/2023)    Readmission Risk Interventions     No data to display

## 2024-09-27 NOTE — Progress Notes (Signed)
 Patient has been discharged per MD order, Prevena wound vac has been connected to patient per MD order. Prevena working properly, seal is intact, previous wound vac emptied with 140 ml of serosanguinous drainage. Home medication loaded into 6NA pyxis with a total of 4 packs, the two remaining packs have been delivered back to patient, pharmacy form signed by patient. Patient received home equipment to room.

## 2024-09-27 NOTE — Progress Notes (Signed)
 Physical Therapy Treatment Patient Details Name: Rhonda Thomas MRN: [id number] DOB: [birth year] Today's Date: 09/27/2024   History of Present Illness Pt is a 68 y.o. female presented to [facility] 09/25/24 for left calcaneous and right great toe osteomyelitis. Pt s/p right foot first ray amputation with wound vac application and L partial calcaneal excision with antibiotic application 10/15. PMHx: ADD, anxiety, depression, chronic foot pain, DM, neuropahy, RLS, hiatal hernia, HLD, and OSA.    PT Comments  Today's session focused on w/c training and stair training. Educated pt on proper and safe use of manual w/c. Demonstrated proper sequencing and features of equipment. Pt propelled the w/c with supervision and cues for improve technique. She navigated from her room to the elevator down to the 5N ortho gym and back. Demonstrated various technique to navigate stairs in accordance with her home set-up and abiding by weight-bearing status. She attempted the backwards/forwards step-to method using RW. Pt had difficulty clearing R foot, it took her three tries to get onto the step with min-modA provided by PT to stabilize trunk and block AD. Educated pt that a hopping technique would not be safe at this time and pt verbalized understanding and opted to try scooting up/down on her bottom. She was able to complete 2 stairs with supervision, but required minA to power up back into standing. Pt verbalized her family would be able to provide adequate support. Discussed how her family should be positioned to assist her with functional mobility. Patient feels ready and safe for discharge with HHPT. I have answered all her questions related to mobility.      If plan is discharge home, recommend the following: A little help with walking and/or transfers;A little help with bathing/dressing/bathroom;Assistance with cooking/housework;Assist for transportation;Help with stairs or ramp for entrance   Can travel by private  vehicle        Equipment Recommendations  Rolling walker (2 wheels);BSC/3in1    Recommendations for Other Services       Precautions / Restrictions Precautions Precautions: Fall Recall of Precautions/Restrictions: Intact Precaution/Restrictions Comments: Wound Vac LLE Required Braces or Orthoses: Other Brace Other Brace: RLE post-op shoe; LLE PRAFO boot     Mobility  Bed Mobility Overal bed mobility: Modified Independent             General bed mobility comments: Pt performed supine<>sit with HOB slightly elevated and took increased time.    Transfers Overall transfer level: Needs assistance Equipment used: Rolling walker (2 wheels) Transfers: Sit to/from Stand, Bed to chair/wheelchair/BSC Sit to Stand: Supervision   Step pivot transfers: Contact guard assist       General transfer comment: Pt demonstrated proper hand placement using RW. Assist to manage line. Transferred R bed>w/c and L w/c>bed using RW. Pt sequenced well and abided by weight bearing precautions. Good eccentric control    Ambulation/Gait Ambulation/Gait assistance: Contact guard assist Gait Distance (Feet): 10 Feet (x2) Assistive device: Rolling walker (2 wheels) Gait Pattern/deviations: Step-to pattern, Narrow base of support Gait velocity: decreased     General Gait Details: Pt took short slow hops with heavy reliance on BUE support on RW. Limited R foot clearance. Pt kept feet close together depsite cues for increased BOS, feet shoulder width apart.   Stairs Stairs: Yes Stairs assistance: Min assist, Mod assist, Supervision Stair Management: Backwards, With walker, Seated/boosting Number of Stairs: 2 General stair comments: Educated pt on stair training in accordance with her home set-up. Demonstrated bkwd/fwd using RW, scooting up/down, and having family bump  her up/down in w/c. Pt attempted to hop up the stairs using RW. Assist to place AD correctly diagonal between floor and step. Pt  with limited R foot clearence. She was able to get up 1 step after 3 tries with min-modA from PT. Pt had an easier time hopping down. Educated pt that given her inconsitent ability to clear floor and high liklihood to catch foot hopping up/down the steps was not a safe option for her. Pt was able to scoot up/down steps with cues for sequencing. Pushing up with BUE support and R foot. Powering up from the step pt held onto PT's upper arms and required minA. Discussed pt bumping up to a stool and then a chair at home prior to standing with family support. Educated pt on how her family should be positioned to support her.   Merchant navy officer mobility: Yes Wheelchair propulsion: Both upper extremities Wheelchair parts: Supervision/cueing Distance: 100 Wheelchair Assistance Details (indicate cue type and reason): Introduced manual w/c and educated pt on the various features including removable leg rests, elevating leg rests, removable arm rests as well as proper sequencing including brakes. She navigated room, hallway, and elevator well. Cues for sequencing. Increased time to adjust around tight spaces.   Tilt Bed    Modified Rankin (Stroke Patients Only)       Balance Overall balance assessment: Needs assistance Sitting-balance support: No upper extremity supported, Feet supported Sitting balance-Leahy Scale: Good     Standing balance support: Bilateral upper extremity supported, During functional activity, Reliant on assistive device for balance Standing balance-Leahy Scale: Poor Standing balance comment: Pt dependent on RW                            Communication Communication Communication: No apparent difficulties  Cognition Arousal: Alert Behavior During Therapy: WFL for tasks assessed/performed   PT - Cognitive impairments: No apparent impairments                         Following commands: Intact      Cueing Cueing  Techniques: Verbal cues, Gestural cues  Exercises      General Comments        Pertinent Vitals/Pain Pain Assessment Pain Assessment: No/denies pain    Home Living                          Prior Function            PT Goals (current goals can now be found in the care plan section) Acute Rehab PT Goals Patient Stated Goal: Return Home PT Goal Formulation: With patient Time For Goal Achievement: 10/10/24 Potential to Achieve Goals: Good Progress towards PT goals: Progressing toward goals    Frequency    Min 2X/week      PT Plan      Co-evaluation              AM-PAC PT 6 Clicks Mobility   Outcome Measure  Help needed turning from your back to your side while in a flat bed without using bedrails?: None Help needed moving from lying on your back to sitting on the side of a flat bed without using bedrails?: None Help needed moving to and from a bed to a chair (including a wheelchair)?: A Little Help needed standing up from a chair using your arms (e.g., wheelchair or bedside chair)?:  A Little Help needed to walk in hospital room?: A Little Help needed climbing 3-5 steps with a railing? : A Lot 6 Click Score: 19    End of Session Equipment Utilized During Treatment: Gait belt Activity Tolerance: Patient tolerated treatment well Patient left: in bed;with call bell/phone within reach Nurse Communication: Mobility status PT Visit Diagnosis: Difficulty in walking, not elsewhere classified (R26.2);Other abnormalities of gait and mobility (R26.89);Unsteadiness on feet (R26.81)     Time: 9052-8971 PT Time Calculation (min) (ACUTE ONLY): 41 min  Charges:    $Gait Training: 23-37 mins $Wheel Chair Management: 8-22 mins PT General Charges $$ ACUTE PT VISIT: 1 Visit                     Randall SAUNDERS, PT, DPT Acute Rehabilitation Services Office: [phone] Secure Chat Preferred  Delon CHRISTELLA Callander 09/27/2024, 12:12 PM

## 2024-09-27 NOTE — Progress Notes (Signed)
 Reviewed AVS, patient expressed understanding of medications, MD follow up reviewed.   Removed IV, Site clean, dry and intact.  See LDA for information on wounds at discharge. Patient states all belongings brought to the hospital at time of admission are accounted for and packed to take home. Currently waiting of wound vac to be delivered so that [facility] [employer] can be contacted to transport patient to Discharge lounge to wait for transportation home.

## 2024-09-27 NOTE — Progress Notes (Signed)
 Occupational Therapy Treatment Patient Details Name: Rhonda Thomas MRN: [id number] DOB: 29-Apr-1956 Today's Date: 09/27/2024   History of present illness Pt is a 68 y.o. female presented to [facility] 09/25/24 for left calcaneous and right great toe osteomyelitis. Pt s/p right foot first ray amputation with wound vac application and L partial calcaneal excision with antibiotic application 10/15. PMHx: ADD, anxiety, depression, chronic foot pain, DM, neuropahy, RLS, hiatal hernia, HLD, and OSA.   OT comments  Reinforced compensatory strategies for ADLs in sitting, leaning side to side, multiple uses of 3 in 1 and shower transfer maintaining NWB on L LE. Recommended IADLs from w/c level. Pt verbalized understanding. Prepared to discharge home.       If plan is discharge home, recommend the following:  A little help with walking and/or transfers;A little help with bathing/dressing/bathroom;Assistance with cooking/housework;Assist for transportation;Help with stairs or ramp for entrance   Equipment Recommendations  BSC/3in1;Wheelchair (measurements OT);Wheelchair cushion (measurements OT)    Recommendations for Other Services      Precautions / Restrictions Precautions Precautions: Fall Recall of Precautions/Restrictions: Intact Precaution/Restrictions Comments: Wound Vac LLE Required Braces or Orthoses: Other Brace Other Brace: RLE post-op shoe; LLE PRAFO boot in bed only Restrictions Weight Bearing Restrictions Per Provider Order: Yes RLE Weight Bearing Per Provider Order: Weight bearing as tolerated LLE Weight Bearing Per Provider Order: Non weight bearing       Mobility Bed Mobility               General bed mobility comments: sittin EOB    Transfers   Equipment used: Rolling walker (2 wheels) Transfers: Sit to/from Stand Sit to Stand: Contact guard assist           General transfer comment: given her difficulty with stair training, recommended pt bring shower  seat to edge of shower, sit and swing LEs into shower     Balance Overall balance assessment: Needs assistance   Sitting balance-Leahy Scale: Good     Standing balance support: Bilateral upper extremity supported, During functional activity, Reliant on assistive device for balance Standing balance-Leahy Scale: Poor                             ADL either performed or assessed with clinical judgement   ADL                   Upper Body Dressing : Set up;Sitting   Lower Body Dressing: Set up                 General ADL Comments: pt completed dressing at EOB leaning side to side, she is able to hop with RW distance necessary to access her bathroom    Extremity/Trunk Assessment              Vision       Perception     Praxis     Communication Communication Communication: No apparent difficulties   Cognition Arousal: Alert Behavior During Therapy: WFL for tasks assessed/performed Cognition: No apparent impairments             OT - Cognition Comments: pt recalled education from previous session                 Following commands: Intact        Cueing   Cueing Techniques: Verbal cues  Exercises      Shoulder Instructions       General  Comments      Pertinent Vitals/ Pain       Pain Assessment Pain Assessment: No/denies pain  Home Living                                          Prior Functioning/Environment              Frequency  Min 2X/week        Progress Toward Goals  OT Goals(current goals can now be found in the care plan section)  Progress towards OT goals: Progressing toward goals  Acute Rehab OT Goals OT Goal Formulation: With patient Time For Goal Achievement: 10/10/24 Potential to Achieve Goals: Good  Plan      Co-evaluation                 AM-PAC OT 6 Clicks Daily Activity     Outcome Measure   Help from another person eating meals?: None Help from  another person taking care of personal grooming?: A Little Help from another person toileting, which includes using toliet, bedpan, or urinal?: A Little Help from another person bathing (including washing, rinsing, drying)?: A Little Help from another person to put on and taking off regular upper body clothing?: A Little Help from another person to put on and taking off regular lower body clothing?: A Little 6 Click Score: 19    End of Session Equipment Utilized During Treatment: Gait belt;Rolling walker (2 wheels)  OT Visit Diagnosis: Unsteadiness on feet (R26.81);Muscle weakness (generalized) (M62.81)   Activity Tolerance Patient tolerated treatment well   Patient Left in bed;with call bell/phone within reach   Nurse Communication          Time: 8882-8860 OT Time Calculation (min): 22 min  Charges: OT General Charges $OT Visit: 1 Visit OT Treatments $Self Care/Home Management : 8-22 mins  Rhonda Thomas, OTR/L Acute Rehabilitation Services Office: [phone]   Rhonda Thomas 09/27/2024, 11:55 AM

## 2024-09-27 NOTE — Plan of Care (Signed)

## 2024-09-30 ENCOUNTER — Other Ambulatory Visit (HOSPITAL_COMMUNITY): Payer: Self-pay

## 2024-09-30 ENCOUNTER — Telehealth: Payer: Self-pay | Admitting: Orthopedic Surgery

## 2024-09-30 LAB — AEROBIC/ANAEROBIC CULTURE W GRAM STAIN (SURGICAL/DEEP WOUND)
Culture: NO GROWTH
Gram Stain: NONE SEEN

## 2024-09-30 NOTE — Telephone Encounter (Signed)
 Patient called and said she seen Harden in the hospital and he told her to make an f/u appointment for Thursday. Can you fit her in please? Thank you , [phone]

## 2024-09-30 NOTE — Telephone Encounter (Signed)
 Can you please call there is an appt open at 9:15. Thanks!

## 2024-10-01 NOTE — Discharge Summary (Signed)
 Physician Discharge Summary  Patient ID: Rhonda Thomas MRN: [id number] DOB/AGE: [birth year] 68 y.o.  Admit date: 09/25/2024 Discharge date: 10/01/2024  Admission Diagnoses:  Principal Problem:   Wound, open with complication Active Problems:   Osteomyelitis of great toe of right foot (HCC)   Acute osteomyelitis of left calcaneus [facility])   Discharge Diagnoses:  Same  Past Medical History:  Diagnosis Date   Anxiety    Attention deficit disorder (ADD)    Chronic foot pain 09/17/2024   bilateral - hx chronic foot ulcers   Depression    Diabetes (HCC)    Hiatal hernia    HLD (hyperlipidemia)    no meds, diet controlled per pt on 1014/25   Movement disorder    Neuropathy of both feet 08/21/2024   in [facility]   Osteomyelitis (HCC) 09/17/2024   left calcaneus and great toe of right foot   Restless legs syndrome (RLS) 08/21/2024   in [facility]   Schatzki's ring    Sleep apnea    does not use CPAP - intolerance to mask    Surgeries: Procedure(s): AMPUTATION, TOE EXCISION, METATARSAL BONE, HEAD APPLICATION, WOUND VAC on 09/25/2024   Consultants:   Discharged Condition: Improved  Hospital Course: Rhonda Thomas is an 68 y.o. female who was admitted 09/25/2024 with a chief complaint of No chief complaint on file. , and found to have a diagnosis of Wound, open with complication.  They were brought to the operating room on 09/25/2024 and underwent the above named procedures.    They were given perioperative antibiotics:  Anti-infectives (From admission, onward)    Start     Dose/Rate Route Frequency Ordered Stop   09/26/24 0830  ceFAZolin  (ANCEF ) IVPB 1 g/50 mL premix        1 g 100 mL/hr over 30 Minutes Intravenous Every 8 hours 09/26/24 0736 09/26/24 2124   09/25/24 1036  tobramycin (NEBCIN) powder  Status:  Discontinued          As needed 09/25/24 1036 09/25/24 1158   09/25/24 1035  vancomycin (VANCOCIN) powder  Status:  Discontinued          As needed 09/25/24 1035  09/25/24 1158   09/25/24 0830  ceFAZolin  (ANCEF ) IVPB 2g/100 mL premix        2 g 200 mL/hr over 30 Minutes Intravenous On call to O.R. 09/25/24 9177 09/25/24 1040     .  They were given compression stockings, early ambulation, and chemoprophylaxis for DVT prophylaxis.  They benefited maximally from their hospital stay and there were no complications.    Recent vital signs:  Vitals:   09/26/24 2229 09/27/24 0952  BP: 120/64 (!) 111/51  Pulse: 70 77  Resp: 17 17  Temp: 98.3 F (36.8 C) 97.8 F (36.6 C)  SpO2: 98% 96%    Recent laboratory studies:  Results for orders placed or performed during the hospital encounter of 09/25/24  Glucose, capillary   Collection Time: 09/25/24  8:23 AM  Result Value Ref Range   Glucose-Capillary 243 (H) 70 - 99 mg/dL  CBC WITH DIFFERENTIAL   Collection Time: 09/25/24  9:00 AM  Result Value Ref Range   WBC 10.8 (H) 4.0 - 10.5 K/uL   RBC 4.18 3.87 - 5.11 MIL/uL   Hemoglobin 11.3 (L) 12.0 - 15.0 g/dL   HCT 63.4 63.9 - 53.9 %   MCV 87.3 80.0 - 100.0 fL   MCH 27.0 26.0 - 34.0 pg   MCHC 31.0 30.0 - 36.0 g/dL  RDW 13.6 11.5 - 15.5 %   Platelets 325 150 - 400 K/uL   nRBC 0.0 0.0 - 0.2 %   Neutrophils Relative % 67 %   Neutro Abs 7.2 1.7 - 7.7 K/uL   Lymphocytes Relative 21 %   Lymphs Abs 2.2 0.7 - 4.0 K/uL   Monocytes Relative 8 %   Monocytes Absolute 0.9 0.1 - 1.0 K/uL   Eosinophils Relative 4 %   Eosinophils Absolute 0.4 0.0 - 0.5 K/uL   Basophils Relative 0 %   Basophils Absolute 0.0 0.0 - 0.1 K/uL   Immature Granulocytes 0 %   Abs Immature Granulocytes 0.03 0.00 - 0.07 K/uL  Comprehensive metabolic panel   Collection Time: 09/25/24  9:00 AM  Result Value Ref Range   Sodium 134 (L) 135 - 145 mmol/L   Potassium 4.5 3.5 - 5.1 mmol/L   Chloride 98 98 - 111 mmol/L   CO2 25 22 - 32 mmol/L   Glucose, Bld 228 (H) 70 - 99 mg/dL   BUN 10 8 - 23 mg/dL   Creatinine, Ser 9.22 0.44 - 1.00 mg/dL   Calcium  8.4 (L) 8.9 - 10.3 mg/dL   Total  Protein 6.5 6.5 - 8.1 g/dL   Albumin 2.7 (L) 3.5 - 5.0 g/dL   AST 13 (L) 15 - 41 U/L   ALT 9 0 - 44 U/L   Alkaline Phosphatase 94 38 - 126 U/L   Total Bilirubin 0.8 0.0 - 1.2 mg/dL   GFR, Estimated >39 >39 mL/min   Anion gap 11 5 - 15  Aerobic/Anaerobic Culture w Gram Stain (surgical/deep wound)   Collection Time: 09/25/24 10:45 AM   Specimen: Soft Tissue, Other  Result Value Ref Range   Specimen Description TISSUE    Special Requests LEFT HEEL    Gram Stain NO WBC SEEN RARE GRAM NEGATIVE RODS     Culture      No growth aerobically or anaerobically. Performed at [facility], [address], [place], [place] [address]    Report Status 09/30/2024 FINAL   Glucose, capillary   Collection Time: 09/25/24 11:22 AM  Result Value Ref Range   Glucose-Capillary 169 (H) 70 - 99 mg/dL    Discharge Medications:   Allergies as of 09/27/2024       Reactions   Canagliflozin  Other (See Comments)   vaginitis   Pioglitazone Other (See Comments)   Edema        Medication List     STOP taking these medications    doxycycline  100 MG tablet Commonly known as: VIBRA -TABS       TAKE these medications    Belbuca 300 MCG Film Generic drug: Buprenorphine HCl Take 1 Film by mouth every 12 (twelve) hours.   DULoxetine  60 MG capsule Commonly known as: CYMBALTA  Take 1 capsule (60 mg total) by mouth at bedtime.   Lantus  SoloStar 100 UNIT/ML Solostar Pen Generic drug: insulin  glargine Inject 30 Units into the skin daily. What changed:  how much to take when to take this   metFORMIN  500 MG tablet Commonly known as: GLUCOPHAGE  Take 1 tablet (500 mg total) by mouth 2 (two) times daily with a meal.   NovoLOG  FlexPen 100 UNIT/ML FlexPen Generic drug: insulin  aspart Inject 3-20 Units into the skin 3 (three) times daily with meals. What changed: when to take this   pregabalin  150 MG capsule Commonly known as: LYRICA  1 in am 2 at night   tirzepatide 5 MG/0.5ML  Pen Commonly  known as: MOUNJARO Inject 5 mg into the skin once a week. DX: E11.65, mail order   Trulicity  4.5 MG/0.5ML Soaj Generic drug: Dulaglutide  Inject 4.5 mg into the skin every 7 (seven) days.        Diagnostic Studies: XR Foot 2 Views Right Result Date: 09/17/2024 2 view radiographs of the right foot shows destructive changes of the tuft of the residual proximal phalanx consistent with chronic osteomyelitis.  XR Foot 2 Views Left Result Date: 09/17/2024 2 view radiographs of the left calcaneus shows chronic osteomyelitis of the calcaneus with an ulcer that extends to bone.   Disposition: Discharge disposition: 01-Home or Self Care       Discharge Instructions     Call MD / Call 911   Complete by: As directed    If you experience chest pain or shortness of breath, CALL 911 and be transported to the hospital emergency room.  If you develope a fever above 101 F, pus (white drainage) or increased drainage or redness at the wound, or calf pain, call your surgeon's office.   Constipation Prevention   Complete by: As directed    Drink plenty of fluids.  Prune juice may be helpful.  You may use a stool softener, such as Colace (over the counter) 100 mg twice a day.  Use MiraLax (over the counter) for constipation as needed.   Diet - low sodium heart healthy   Complete by: As directed    Discharge instructions   Complete by: As directed    Heel weight bearing right  Lower ext. in post op shoe.  No weight on the left foot.  Wear boot at all times.  Elevate for decreased swelling.  The vac will stay on until your follow up in 1 week.  Keep it clean and dry.   Increase activity slowly as tolerated   Complete by: As directed    Heel weight bearing right  Lower ext. in post op shoe.  No weight on the left foot.  Wear boot at all times.  Elevate for decreased swelling.  The vac will stay on until your follow up in 1 week.  Keep it clean and dry.   Post-operative opioid taper  instructions:   Complete by: As directed    POST-OPERATIVE OPIOID TAPER INSTRUCTIONS: It is important to wean off of your opioid medication as soon as possible. If you do not need pain medication after your surgery it is ok to stop day one. Opioids include: Codeine, Hydrocodone(Norco, Vicodin), Oxycodone (Percocet, oxycontin ) and hydromorphone amongst others.  Long term and even short term use of opiods can cause: Increased pain response Dependence Constipation Depression Respiratory depression And more.  Withdrawal symptoms can include Flu like symptoms Nausea, vomiting And more Techniques to manage these symptoms Hydrate well Eat regular healthy meals Stay active Use relaxation techniques(deep breathing, meditating, yoga) Do Not substitute Alcohol to help with tapering If you have been on opioids for less than two weeks and do not have pain than it is ok to stop all together.  Plan to wean off of opioids This plan should start within one week post op of your joint replacement. Maintain the same interval or time between taking each dose and first decrease the dose.  Cut the total daily intake of opioids by one tablet each day Next start to increase the time between doses. The last dose that should be eliminated is the evening dose.           Follow-up  Information     Harden Jerona GAILS, MD Follow up in 1 week(s).   Specialty: Orthopedic Surgery Contact information: [address] [place]  [place] [address] [phone]         [facility] [place]  Follow up.   Contact information: [address] [place] [address] [phone]                  Signed: Maurilio Deland Collet 10/01/2024, 4:47 PM

## 2024-10-03 ENCOUNTER — Ambulatory Visit: Admitting: Orthopedic Surgery

## 2024-10-03 DIAGNOSIS — M869 Osteomyelitis, unspecified: Secondary | ICD-10-CM

## 2024-10-04 ENCOUNTER — Telehealth: Payer: Self-pay | Admitting: Orthopedic Surgery

## 2024-10-04 NOTE — Telephone Encounter (Signed)
 Ellouise Banker) from [facility] called requesting orders for PT and OT. Ellouise secure number is [phone]

## 2024-10-04 NOTE — Telephone Encounter (Signed)
 I called and lm on vm to advise that the pt was seen in the office yesterday and should not be wtb on either foot. She can for transfers only. OK for OT and PT if they want to work on upper body but she has a calcaneal excision with PRAFO boot on the left and a 1st ray amputation on the right.

## 2024-10-07 ENCOUNTER — Encounter: Payer: Self-pay | Admitting: Orthopedic Surgery

## 2024-10-07 NOTE — Progress Notes (Signed)
 10/08/2024 Name: Rhonda Thomas MRN: [id number] DOB: Nov 07, 1956  Chief Complaint  Patient presents with   Medication Management   Diabetes    Rhonda Thomas is a 68 y.o. year old female who presented for a telephone visit. They were referred to the pharmacist by their PCP for assistance in managing diabetes, medication access, and complex medication management.    Subjective: Rhonda Thomas presents for a telephone visit for follow-up for diabetes and medication access. She has been doing well since her surgery; she is having difficulty with immobility post-op. She noticed that her blood sugar has been elevated, but she is concerned about overcorrection with her mealtime insulin . She took her last dose of 2.5 mg Mounjaro a couple of days ago and has yet to receive her 5 mg Mounjaro in the mail.   Regarding her statin therapy, it was discontinued off of her profile due to patient preference. However, she doesn't remember asking for the medication to be taken off of her profile. She states that she has been taking it and that she has a surplus of the medication at home. She does attest to several missed doses but wants to continue statin therapy. Counseled on the ASCVD protection with statins and the importance of taking them every day.   She is also receiving Libre 3+ sensors from [facility], but this was not listed on her medication profile. She uses her phone as a reader. She would like to transition from [facility] to [employer] for convenience.   Care Team: Primary Care Provider: Dettinger, Rhonda LABOR, MD ; Next Scheduled Visit: 11/22/2024  Medication Access/Adherence Current Pharmacy:  [facility] - [place], [place] - [address] [place] [address] Phone: [phone] Fax: [phone]  [facility] [place], [place] - [address] [place] [address] Phone: [phone] Fax:  [phone]  Rhonda Thomas - [facility] [address] [place] [address] Phone: [phone] Fax: [phone]  Patient reports affordability concerns with their medications: Yes  Patient reports access/transportation concerns to their pharmacy: No  Patient reports adherence concerns with their medications:  No    Diabetes: Current medications: Lantus  28 units daily, Novolog  SS, Mounjaro 5 mg weekly, metformin  500 mg BID Medications tried in the past: Invokana  (yeast), glimepiride , sitagliptin , pioglitazone, Trulicity  (GI intolerance)  Statin: NONE; has used rosuvastatin  in the past (D/C'd due to patient preference) LDL of 42 on 08/22/24 ACEi/ARB: NONE; has used lisinopril  in the past UACR of 6 on 02/14/24  A1c of 8.5% on 08/22/24, increased from 7.1% on 05/16/24  LIBRE 3+ (PHONE) Date of Download: 10/08/24 Average Glucose: 240 mg/dL Time in Goal:  - Time in range 70-180: 25% - Time above range: 30 + 43% - Time below range: 2%  Patient denies hypoglycemic s/sx including dizziness, shakiness, sweating. Patient denies hyperglycemic symptoms including polyuria, polydipsia, polyphagia, nocturia, neuropathy, blurred vision.  Current meal patterns:  Discussed meal planning options and Plate method for healthy eating Avoid sugary drinks and desserts Incorporate balanced protein, non starchy veggies, 1 serving of carbohydrate with each meal Increase water intake Increase physical activity as able    Objective: Lab Results  Component Value Date   HGBA1C 8.5 (H) 08/22/2024   Lab Results  Component Value Date   CREATININE 0.77 09/25/2024   BUN 10 09/25/2024   NA 134 (L) 09/25/2024   K 4.5 09/25/2024   CL 98 09/25/2024   CO2  25 09/25/2024   Lab Results  Component Value Date   CHOL 113 08/22/2024   HDL 53 08/22/2024   LDLCALC 42 08/22/2024   LDLDIRECT 83 09/17/2014   TRIG 96 08/22/2024   CHOLHDL 2.1 08/22/2024   Medications Reviewed Today      Reviewed by Rhonda Thomas, [facility] (Pharmacist) on 10/08/24 at 1449  Med List Status: <None>   Medication Order Taking? Sig Documenting Provider Last Dose Status Informant  BELBUCA 300 MCG FILM [id number]  Take 1 Film by mouth every 12 (twelve) hours. [provider]  Active Self, Pharmacy Records  DULoxetine  (CYMBALTA ) 60 MG capsule [id number]  Take 1 capsule (60 mg total) by mouth at bedtime. Rhonda Thomas, Rhonda LABOR, MD  Active Self, Pharmacy Records  insulin  aspart (NOVOLOG  FLEXPEN) 100 UNIT/ML FlexPen [id number]  Inject 3-20 Units into the skin 3 (three) times daily with meals.  Patient taking differently: Inject 3-20 Units into the skin 2 (two) times daily.   Rhonda Thomas, Rhonda LABOR, MD  Active Self, Pharmacy Records  insulin  glargine (LANTUS  SOLOSTAR) 100 UNIT/ML Solostar Pen [id number] Yes Inject 30 Units into the skin daily.  Patient taking differently: Inject 28 Units into the skin daily.   Rhonda Thomas, Rhonda LABOR, MD  Active Self, Pharmacy Records    Discontinued 10/08/24 1448 (Change in therapy) pregabalin  (LYRICA ) 150 MG capsule [id number]  1 in am 2 at night Rhonda Duos, MD  Active Self, Pharmacy Records  tirzepatide [facility]) 5 MG/0.5ML Pen [id number] Yes Inject 5 mg into the skin once a week. DX: E11.65, mail order Rhonda Thomas, Rhonda LABOR, MD  Active Self, Pharmacy Records  TRULICITY  4.5 MG/0.5ML EMMANUEL [id number]  Inject 4.5 mg into the skin every 7 (seven) days.  Patient not taking: Reported on 09/23/2024   [provider]  Consider Medication Status and Discontinue (Patient Preference) Self, Pharmacy Records            Assessment/Plan:  Diabetes: Currently uncontrolled. Goal of <7% Cardiorenal risk reduction is opportunities for improvement. Intolerance to SGLT2i (infection); previous history of lisinopril , but not on profile at this time. Blood pressure is at goal of <130/80 mmHg LDL is at goal of <70 mg/dL Reviewed Thomas term cardiovascular and renal outcomes of  uncontrolled blood sugar Reviewed goal A1c, goal fasting, and goal 2 hour post prandial glucose Recommend to:  INCREASE metformin  to 500 mg XR: two tablets twice daily Rx sent to [facility] (mail-order) INCREASE Mounjaro to 5 mg weekly Continue Lantus  28 units daily Continue Novolog  SS RESTART rosuvastatin  5 mg daily  Rx sent to [facility] [employer] [facility]) Recommend to check glucose continuously with [employer] 3+ (phone as reader) Rx sent to [facility] [employer] [facility]) Patient denies personal or family history of multiple endocrine neoplasia type 2, medullary thyroid  cancer; personal history of pancreatitis or gallbladder disease., Discussed side effects of gastrointestinal upset/nausea; eating smaller meals, avoiding high-fat foods, and remaining upright after eating may reduce nausea. Discussed that overeating is a major trigger of nausea with this class of medications, as often times patients will start to feel full sooner and may need to decrease portion sizes from what they were previously accustomed to.   Follow Up Plan:  PCP: 11/22/2024 PharmD: 2 weeks  Thomas Rhonda, PharmD PGY1 Pharmacy Resident  10/08/2024  Mliss Tarry Griffin, PharmD, BCACP, CPP Clinical Pharmacist, [facility]

## 2024-10-07 NOTE — Progress Notes (Signed)
 Office Visit Note   Patient: Rhonda Thomas           Date of Birth: 1956-03-24           MRN: [id number] Visit Date: 10/03/2024              Requested by: Dettinger, Fonda LABOR, MD [address] [place],  [place] [address] PCP: Dettinger, Fonda LABOR, MD  Chief Complaint  Patient presents with   Right Foot - Routine Post Op    08/26/2024 right 1st ray amputation    Left Foot - Routine Post Op    Partial calcaneal excision      HPI: Discussed the use of AI scribe software for clinical note transcription with the patient, who gave verbal consent to proceed.  History of Present Illness Rhonda Thomas is a 68 year old female who presents with wound dehiscence and swelling following right great toe amputation and left partial calcaneal excision.  She acknowledges that she may have been putting too much weight on this foot, despite it being non-weight bearing.  She mentions that a frozen glass bowl may have fallen and possibly hit the area, although she did not feel any impact.  She is currently using gauze and an Ace wrap for the right foot. For the left heel, she is using a four by four dressing.     Assessment & Plan: Visit Diagnoses:  1. Osteomyelitis of great toe of right foot (HCC)     Plan: Assessment and Plan Assessment & Plan Wound dehiscence of right great toe amputation site Increased swelling and 2 mm wound dehiscence, likely from excessive weight bearing. Circulation remains good. - Elevate right foot above heart level. - Minimize weight bearing on right foot. - Cleanse wound with soap and water daily. - Apply gauze and Ace wrap daily.  Wound dehiscence of left heel after partial calcaneal excision 1 cm wound dehiscence with healthy granulation tissue, no exposed bone or tendon, and some skin maceration. - Place left foot in Prafo to offload pressure. - Cleanse wound with soap and water daily. - Apply 4x4 dressing changes daily. - Wear Unna boot at  all times.      Follow-Up Instructions: No follow-ups on file.   Ortho Exam  Patient is alert, oriented, no adenopathy, well-dressed, normal affect, normal respiratory effort. Physical Exam EXTREMITIES: Right foot great toe amputation site with 2mm wound dehiscence and increased swelling. Left heel partial calcaneal excision site with wound dehiscence and skin maceration. Left heel wound dehiscence 1cm in diameter with healthy granulation tissue, no exposed bone or tendon.      Imaging: No results found. No images are attached to the encounter.  Labs: Lab Results  Component Value Date   HGBA1C 8.5 (H) 08/22/2024   HGBA1C 7.1 (H) 05/16/2024   HGBA1C 6.8 (H) 02/14/2024   ESRSEDRATE 3 10/13/2020   CRP 3 10/13/2020   REPTSTATUS 09/30/2024 FINAL 09/25/2024   GRAMSTAIN NO WBC SEEN RARE GRAM NEGATIVE RODS  09/25/2024   CULT  09/25/2024    No growth aerobically or anaerobically. Performed at [facility], [address] [place], [place] [address]      Lab Results  Component Value Date   ALBUMIN 2.7 (L) 09/25/2024   ALBUMIN 3.4 (L) 08/22/2024   ALBUMIN 3.8 (L) 02/14/2024    No results found for: MG Lab Results  Component Value Date   VD25OH 17.4 (L) 10/13/2020   VD25OH 27.2 (L) 08/25/2017  No results found for: PREALBUMIN    Latest Ref Rng & Units 09/25/2024    9:00 AM 08/22/2024    9:21 AM 02/14/2024    9:53 AM  CBC EXTENDED  WBC 4.0 - 10.5 K/uL 10.8  6.2  7.3   RBC 3.87 - 5.11 MIL/uL 4.18  4.01  4.20   Hemoglobin 12.0 - 15.0 g/dL 88.6  89.0  87.9   HCT 36.0 - 46.0 % 36.5  35.5  36.3   Platelets 150 - 400 K/uL 325  300  304   NEUT# 1.7 - 7.7 K/uL 7.2  3.1  4.1   Lymph# 0.7 - 4.0 K/uL 2.2  2.2  2.2      There is no height or weight on file to calculate BMI.  Orders:  No orders of the defined types were placed in this encounter.  No orders of the defined types were placed in this encounter.    Procedures: No procedures  performed  Clinical Data: No additional findings.  ROS:  All other systems negative, except as noted in the HPI. Review of Systems  Objective: Vital Signs: There were no vitals taken for this visit.  Specialty Comments:  No specialty comments available.  PMFS History: Patient Active Problem List   Diagnosis Date Noted   Osteomyelitis of great toe of right foot (HCC) 09/25/2024   Acute osteomyelitis of left calcaneus (HCC) 09/25/2024   Wound, open with complication 09/25/2024   Diabetes mellitus (HCC) 10/28/2022   Nuclear sclerotic cataract of both eyes 05/05/2022   Personal history of diabetic foot ulcer 08/10/2019   Bilateral carpal tunnel syndrome 02/25/2019   Major depressive disorder, recurrent episode, moderate (HCC) 02/09/2018   Diabetic peripheral neuropathy (HCC) 11/07/2017   Vitamin B12 deficiency 11/07/2017   DDD (degenerative disc disease), lumbosacral 10/02/2017   Overweight (BMI 25.0-29.9) 10/24/2016   Somnolence, daytime 03/10/2015   Generalized anxiety disorder 06/21/2013   Attention deficit disorder 06/21/2013   Hyperlipidemia 06/21/2013   Restless legs syndrome (RLS) 06/20/2013   Mild nonproliferative diabetic retinopathy of both eyes (HCC) 06/20/2013   Past Medical History:  Diagnosis Date   Anxiety    Attention deficit disorder (ADD)    Chronic foot pain 09/17/2024   bilateral - hx chronic foot ulcers   Depression    Diabetes (HCC)    Hiatal hernia    HLD (hyperlipidemia)    no meds, diet controlled per pt on 1014/25   Movement disorder    Neuropathy of both feet 08/21/2024   in [facility]   Osteomyelitis (HCC) 09/17/2024   left calcaneus and great toe of right foot   Restless legs syndrome (RLS) 08/21/2024   in [facility]   Schatzki's ring    Sleep apnea    does not use CPAP - intolerance to mask    Family History  Problem Relation Age of Onset   Stroke Mother    Sleep apnea Mother    Diabetes Father    Heart disease Father        pacemaker    Sleep apnea Father    Diabetes Other    Heart Problems Other    Colon cancer Neg Hx    Breast cancer Neg Hx     Past Surgical History:  Procedure Laterality Date   ABDOMINAL HYSTERECTOMY     AMPUTATION TOE Right 09/25/2024   Procedure: AMPUTATION, TOE;  Surgeon: Harden Jerona GAILS, MD;  Location: [facility] OR;  Service: Orthopedics;  Laterality: Right;  RIGHT GREAT TOE AMPUTATION  APPLICATION OF WOUND VAC N/A 09/25/2024   Procedure: APPLICATION, WOUND VAC;  Surgeon: Harden Jerona GAILS, MD;  Location: [facility] OR;  Service: Orthopedics;  Laterality: N/A;   BREAST LUMPECTOMY WITH RADIOACTIVE SEED LOCALIZATION Right 04/25/2016   Procedure: RIGHT BREAST LUMPECTOMY WITH RADIOACTIVE SEED LOCALIZATION;  Surgeon: Deward Null III, MD;  Location: [facility];  Service: General;  Laterality: Right;   CATARACT EXTRACTION, BILATERAL Bilateral    CHOLECYSTECTOMY     COLONOSCOPY  2013   FOOT SURGERY Left 05/2024   at [facility]   METATARSAL HEAD EXCISION Left 09/25/2024   Procedure: EXCISION, METATARSAL BONE, HEAD;  Surgeon: Harden Jerona GAILS, MD;  Location: [facility] OR;  Service: Orthopedics;  Laterality: Left;  LEFT PARTIAL CALCANEOUS EXCISION   RECTOCELE REPAIR     SPINAL CORD STIMULATOR IMPLANT     not currently working per pt on 09/24/24   TOE AMPUTATION Right    2nd toe rt foot   TUBAL LIGATION     UPPER GI ENDOSCOPY  2016   Social History   Occupational History   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

## 2024-10-08 ENCOUNTER — Other Ambulatory Visit (INDEPENDENT_AMBULATORY_CARE_PROVIDER_SITE_OTHER)

## 2024-10-08 ENCOUNTER — Other Ambulatory Visit (HOSPITAL_COMMUNITY): Payer: Self-pay

## 2024-10-08 ENCOUNTER — Other Ambulatory Visit: Payer: Self-pay

## 2024-10-08 DIAGNOSIS — E1169 Type 2 diabetes mellitus with other specified complication: Secondary | ICD-10-CM

## 2024-10-08 DIAGNOSIS — Z794 Long term (current) use of insulin: Secondary | ICD-10-CM

## 2024-10-08 MED ORDER — FREESTYLE LIBRE 3 PLUS SENSOR MISC
3 refills | Status: AC
  Filled 2024-10-08: qty 6, fill #0

## 2024-10-08 MED ORDER — ROSUVASTATIN CALCIUM 5 MG PO TABS
5.0000 mg | ORAL_TABLET | Freq: Every day | ORAL | 3 refills | Status: AC
  Filled 2024-10-08 (×2): qty 90, 90d supply, fill #0

## 2024-10-08 MED ORDER — METFORMIN HCL ER 500 MG PO TB24
1000.0000 mg | ORAL_TABLET | Freq: Two times a day (BID) | ORAL | 6 refills | Status: AC
  Filled 2024-10-08 (×2): qty 180, 45d supply, fill #0

## 2024-10-10 ENCOUNTER — Other Ambulatory Visit: Payer: Self-pay

## 2024-10-10 ENCOUNTER — Encounter: Payer: Self-pay | Admitting: Orthopedic Surgery

## 2024-10-10 ENCOUNTER — Encounter (HOSPITAL_COMMUNITY): Payer: Self-pay | Admitting: Orthopedic Surgery

## 2024-10-10 ENCOUNTER — Ambulatory Visit (INDEPENDENT_AMBULATORY_CARE_PROVIDER_SITE_OTHER): Admitting: Orthopedic Surgery

## 2024-10-10 DIAGNOSIS — T8781 Dehiscence of amputation stump: Secondary | ICD-10-CM

## 2024-10-10 NOTE — Progress Notes (Signed)
 Office Visit Note   Patient: Rhonda Thomas           Date of Birth: 06/08/1956           MRN: [id number] Visit Date: 10/10/2024              Requested by: Dettinger, Fonda LABOR, MD [address] [place],  [place] [address] PCP: Dettinger, Fonda LABOR, MD  Chief Complaint  Patient presents with   Right Foot - Routine Post Op    08/26/2024 right 1st ray amputation    Left Foot - Routine Post Op    Partial calcaneal excision      HPI: Discussed the use of AI scribe software for clinical note transcription with the patient, who gave verbal consent to proceed.  History of Present Illness Rhonda Thomas is a 68 year old female with chronic foot wounds who presents with wound dehiscence and exposed bone in both feet.  She has a history of poor healing attributed to 'bad microscopic circulation', leading to difficulty in wound healing. Currently, she has a dehisced wound on the right foot with the first metatarsal exposed and a dehisced wound on the left heel with the calcaneus exposed. These issues have arisen six weeks post her initial surgery.  She has been making efforts to stay off her feet as much as possible, utilizing a wheelchair to aid in this effort. She is concerned about whether she has done anything wrong or needs to alter her care routine, indicating her commitment to following care instructions.  She uses a small white pad around the wound to absorb drainage and is worried it might affect healing. She is running low on dressings and needs to order more supplies, specifically four by four gauze, Ace wrap, and Vashe, a sodium hypochlorite solution.     Assessment & Plan: Visit Diagnoses:  1. Dehiscence of amputation stump of right lower extremity (HCC)   2. Dehiscence of amputation stump of left lower extremity (HCC)     Plan: Assessment and Plan Assessment & Plan Dehisced right great toe amputation wound with exposed first metatarsal The wound dehisced,  exposing the first metatarsal, likely due to poor microscopic circulation despite good ankle pulses. Surgical intervention necessary to prevent complications. Consider foot amputation if healing fails, but good pulses warrant another attempt to save the foot. - Perform first ray amputation on the right foot tomorrow. - Observe overnight post-surgery. - Discharge home with a portable wound vac. - Schedule follow-up in one week for wound vac change. - Instruct to use Vashe dressing changes after wound vac removal.  Dehisced left calcaneal excision wound with exposed calcaneus The wound dehisced, exposing the calcaneus, likely due to poor microscopic circulation despite good ankle pulses. Surgical intervention necessary to prevent complications. Consider foot amputation if healing fails, but good pulses warrant another attempt to save the foot. - Perform repeat partial calcaneal excision on the left foot tomorrow. - Observe overnight post-surgery. - Discharge home with a portable wound vac. - Schedule follow-up in one week for wound vac change. - Instruct to use Vashe dressing changes after wound vac removal.      Follow-Up Instructions: Return in about 1 week (around 10/17/2024).   Ortho Exam  Patient is alert, oriented, no adenopathy, well-dressed, normal affect, normal respiratory effort. Physical Exam CARDIOVASCULAR: Strong palpable dorsalis pedis pulse bilaterally. EXTREMITIES: Complete wound dehiscence on right great toe amputation with exposed first metatarsal head. Dehiscence of calcaneal excision with exposed calcaneus  on left foot. SKIN: No ascending cellulitis, no purulent drainage.      Imaging: No results found.    Labs: Lab Results  Component Value Date   HGBA1C 8.5 (H) 08/22/2024   HGBA1C 7.1 (H) 05/16/2024   HGBA1C 6.8 (H) 02/14/2024   ESRSEDRATE 3 10/13/2020   CRP 3 10/13/2020   REPTSTATUS 09/30/2024 FINAL 09/25/2024   GRAMSTAIN NO WBC SEEN RARE GRAM  NEGATIVE RODS  09/25/2024   CULT  09/25/2024    No growth aerobically or anaerobically. Performed at [facility], [address], [place], [place] [address]      Lab Results  Component Value Date   ALBUMIN 2.7 (L) 09/25/2024   ALBUMIN 3.4 (L) 08/22/2024   ALBUMIN 3.8 (L) 02/14/2024    No results found for: MG Lab Results  Component Value Date   VD25OH 17.4 (L) 10/13/2020   VD25OH 27.2 (L) 08/25/2017    No results found for: PREALBUMIN    Latest Ref Rng & Units 09/25/2024    9:00 AM 08/22/2024    9:21 AM 02/14/2024    9:53 AM  CBC EXTENDED  WBC 4.0 - 10.5 K/uL 10.8  6.2  7.3   RBC 3.87 - 5.11 MIL/uL 4.18  4.01  4.20   Hemoglobin 12.0 - 15.0 g/dL 88.6  89.0  87.9   HCT 36.0 - 46.0 % 36.5  35.5  36.3   Platelets 150 - 400 K/uL 325  300  304   NEUT# 1.7 - 7.7 K/uL 7.2  3.1  4.1   Lymph# 0.7 - 4.0 K/uL 2.2  2.2  2.2      There is no height or weight on file to calculate BMI.  Orders:  No orders of the defined types were placed in this encounter.  No orders of the defined types were placed in this encounter.    Procedures: No procedures performed  Clinical Data: No additional findings.  ROS:  All other systems negative, except as noted in the HPI. Review of Systems  Objective: Vital Signs: There were no vitals taken for this visit.  Specialty Comments:  No specialty comments available.  PMFS History: Patient Active Problem List   Diagnosis Date Noted   Osteomyelitis of great toe of right foot (HCC) 09/25/2024   Acute osteomyelitis of left calcaneus (HCC) 09/25/2024   Wound, open with complication 09/25/2024   Diabetes mellitus (HCC) 10/28/2022   Nuclear sclerotic cataract of both eyes 05/05/2022   Personal history of diabetic foot ulcer 08/10/2019   Bilateral carpal tunnel syndrome 02/25/2019   Major depressive disorder, recurrent episode, moderate (HCC) 02/09/2018   Diabetic peripheral neuropathy (HCC) 11/07/2017   Vitamin B12 deficiency  11/07/2017   DDD (degenerative disc disease), lumbosacral 10/02/2017   Overweight (BMI 25.0-29.9) 10/24/2016   Somnolence, daytime 03/10/2015   Generalized anxiety disorder 06/21/2013   Attention deficit disorder 06/21/2013   Hyperlipidemia 06/21/2013   Restless legs syndrome (RLS) 06/20/2013   Mild nonproliferative diabetic retinopathy of both eyes (HCC) 06/20/2013   Past Medical History:  Diagnosis Date   Anxiety    Attention deficit disorder (ADD)    Chronic foot pain 09/17/2024   bilateral - hx chronic foot ulcers   Depression    Diabetes (HCC)    Hiatal hernia    HLD (hyperlipidemia)    no meds, diet controlled per pt on 1014/25   Movement disorder    Neuropathy of both feet 08/21/2024   in [place]   Osteomyelitis (HCC) 09/17/2024  left calcaneus and great toe of right foot   Restless legs syndrome (RLS) 08/21/2024   in [place]   Schatzki's ring    Sleep apnea    does not use CPAP - intolerance to mask    Family History  Problem Relation Age of Onset   Stroke Mother    Sleep apnea Mother    Diabetes Father    Heart disease Father        pacemaker   Sleep apnea Father    Diabetes Other    Heart Problems Other    Colon cancer Neg Hx    Breast cancer Neg Hx     Past Surgical History:  Procedure Laterality Date   ABDOMINAL HYSTERECTOMY     AMPUTATION TOE Right 09/25/2024   Procedure: AMPUTATION, TOE;  Surgeon: Harden Jerona GAILS, MD;  Location: [facility] OR;  Service: Orthopedics;  Laterality: Right;  RIGHT GREAT TOE AMPUTATION   APPLICATION OF WOUND VAC N/A 09/25/2024   Procedure: APPLICATION, WOUND VAC;  Surgeon: Harden Jerona GAILS, MD;  Location: [facility];  Service: Orthopedics;  Laterality: N/A;   BREAST LUMPECTOMY WITH RADIOACTIVE SEED LOCALIZATION Right 04/25/2016   Procedure: RIGHT BREAST LUMPECTOMY WITH RADIOACTIVE SEED LOCALIZATION;  Surgeon: Deward Null III, MD;  Location: [facility];  Service: General;  Laterality: Right;   CATARACT EXTRACTION, BILATERAL Bilateral     CHOLECYSTECTOMY     COLONOSCOPY  2013   FOOT SURGERY Left 05/2024   at [facility]   METATARSAL HEAD EXCISION Left 09/25/2024   Procedure: EXCISION, METATARSAL BONE, HEAD;  Surgeon: Harden Jerona GAILS, MD;  Location: [facility];  Service: Orthopedics;  Laterality: Left;  LEFT PARTIAL CALCANEOUS EXCISION   RECTOCELE REPAIR     SPINAL CORD STIMULATOR IMPLANT     not currently working per pt on 09/24/24   TOE AMPUTATION Right    2nd toe rt foot   TUBAL LIGATION     UPPER GI ENDOSCOPY  2016   Social History   Occupational History   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

## 2024-10-10 NOTE — Progress Notes (Addendum)
 PCP - Dettinger, Fonda LABOR, MD  Cardiologist -   PPM/ICD - denies Device Orders - n/a Rep Notified - n/a  Spinal cord stimulator is turned off per patient   Chest x-ray - denies EKG - denies Stress Test - denies ECHO - denies Cardiac Cath - denies  CPAP - unable to tolerate  GLP-1 -tirzepatide (MOUNJARO) Last dose- 10-07-24  Fasting Blood Sugar - Per patient blood sugar ranges around 250. Blood sugar this am was 254.  Per patient blood sugars have been elevated since surgery Checks Blood Sugar Freestyle libre to left arm A1c 8.5 on 08-22-24  Blood Thinner Instructions: denies Aspirin  Instructions: denies  ERAS Protcol - clear liquids until 10:15 am.  COVID TEST- n/a  Anesthesia review: no  [place] patient is not bringing medication with her. She called pain MD and was told to restart once out of hospital  Patient verbally denies any shortness of breath, fever, cough and chest pain during phone call   -------------  SDW INSTRUCTIONS given:  Your procedure is scheduled on October 11, 2024.  Report to [facility] Main Entrance A at 10:45 A.M., and check in at the Admitting office.  Call this number if you have problems the morning of surgery:  [phone]   Remember:  Do not eat after midnight the night before your surgery  You may drink clear liquids until 10:15 the morning of your surgery.   Clear liquids allowed are: Water, Non-Citrus Juices (without pulp), Carbonated Beverages, Clear Tea, Black Coffee Only, and Gatorade    Take these medicines the morning of surgery with A SIP OF WATER  BELBUCA  pregabalin  (LYRICA )  rosuvastatin  (CRESTOR )   As of today, STOP taking any Aspirin  (unless otherwise instructed by your surgeon) Aleve, Naproxen, Ibuprofen, Motrin, Advil, Goody's, BC's, all herbal medications, fish oil, and all vitamins.  WHAT DO I DO ABOUT MY DIABETES MEDICATION?   Do not take oral diabetes medicines metFORMIN  (GLUCOPHAGE -XR)  the morning of  surgery.  THE NIGHT BEFORE SURGERY, take 14 units of LANTUS  insulin .       THE MORNING OF SURGERY, take 1/2 of correction dose of  aspart (NOVOLOG  FLEXPEN) insulin  if blood sugar greater than 220 tirzepatide [facility]) LAST dose:  The day of surgery, do not take other diabetes injectables, including Byetta (exenatide), Bydureon (exenatide ER), Victoza (liraglutide), or Trulicity  (dulaglutide ).  If your CBG is greater than 220 mg/dL, you may take  of your sliding scale (correction) dose of insulin .   HOW TO MANAGE YOUR DIABETES BEFORE AND AFTER SURGERY  Why is it important to control my blood sugar before and after surgery? Improving blood sugar levels before and after surgery helps healing and can limit problems. A way of improving blood sugar control is eating a healthy diet by:  Eating less sugar and carbohydrates  Increasing activity/exercise  Talking with your doctor about reaching your blood sugar goals High blood sugars (greater than 180 mg/dL) can raise your risk of infections and slow your recovery, so you will need to focus on controlling your diabetes during the weeks before surgery. Make sure that the doctor who takes care of your diabetes knows about your planned surgery including the date and location.  How do I manage my blood sugar before surgery? Check your blood sugar at least 4 times a day, starting 2 days before surgery, to make sure that the level is not too high or low.  Check your blood sugar the morning of your surgery when you wake  up and every 2 hours until you get to the Short Stay unit.  If your blood sugar is less than 70 mg/dL, you will need to treat for low blood sugar: Do not take insulin . Treat a low blood sugar (less than 70 mg/dL) with  cup of clear juice (cranberry or apple), 4 glucose tablets, OR glucose gel. Recheck blood sugar in 15 minutes after treatment (to make sure it is greater than 70 mg/dL). If your blood sugar is not greater than 70  mg/dL on recheck, call [phone] for further instructions. Report your blood sugar to the short stay nurse when you get to Short Stay.  If you are admitted to the hospital after surgery: Your blood sugar will be checked by the staff and you will probably be given insulin  after surgery (instead of oral diabetes medicines) to make sure you have good blood sugar levels. The goal for blood sugar control after surgery is 80-180 mg/dL.                      Do not wear jewelry, make up, or nail polish            Do not wear lotions, powders, perfumes/colognes, or deodorant.            Do not shave 48 hours prior to surgery.  Men may shave face and neck.            Do not bring valuables to the hospital.            [facility] is not responsible for any belongings or valuables.  Do NOT Smoke (Tobacco/Vaping) 24 hours prior to your procedure If you use a CPAP at night, you may bring all equipment for your overnight stay.   Contacts, glasses, dentures or bridgework may not be worn into surgery.      For patients admitted to the hospital, discharge time will be determined by your treatment team.   Patients discharged the day of surgery will not be allowed to drive home, and someone needs to stay with them for 24 hours.    Special instructions:   [facility]- Preparing For Surgery  Before surgery, you can play an important role. Because skin is not sterile, your skin needs to be as free of germs as possible. You can reduce the number of germs on your skin by washing with CHG (chlorahexidine gluconate) Soap before surgery.  CHG is an antiseptic cleaner which kills germs and bonds with the skin to continue killing germs even after washing.    Oral Hygiene is also important to reduce your risk of infection.  Remember - BRUSH YOUR TEETH THE MORNING OF SURGERY WITH YOUR REGULAR TOOTHPASTE  Please do not use if you have an allergy to CHG or antibacterial soaps. If your skin becomes reddened/irritated  stop using the CHG.  Do not shave (including legs and underarms) for at least 48 hours prior to first CHG shower. It is OK to shave your face.  Please follow these instructions carefully.   Shower the NIGHT BEFORE SURGERY and the MORNING OF SURGERY with DIAL Soap.   Pat yourself dry with a CLEAN TOWEL.  Wear CLEAN PAJAMAS to bed the night before surgery  Place CLEAN SHEETS on your bed the night of your first shower and DO NOT SLEEP WITH PETS.   Day of Surgery: Please shower morning of surgery  Wear Clean/Comfortable clothing the morning of surgery Do not apply any deodorants/lotions.  Remember to brush your teeth WITH YOUR REGULAR TOOTHPASTE.   Questions were answered. Patient verbalized understanding of instructions.

## 2024-10-11 ENCOUNTER — Observation Stay (HOSPITAL_COMMUNITY)
Admission: RE | Admit: 2024-10-11 | Discharge: 2024-10-12 | Disposition: A | Discharge status: Home / Self Care | Source: Ambulatory Visit | Attending: Orthopedic Surgery | Admitting: Orthopedic Surgery

## 2024-10-11 ENCOUNTER — Ambulatory Visit (HOSPITAL_COMMUNITY): Admitting: Anesthesiology

## 2024-10-11 ENCOUNTER — Encounter (HOSPITAL_COMMUNITY)
Admission: RE | Disposition: A | Discharge status: Home / Self Care | Payer: Self-pay | Source: Ambulatory Visit | Attending: Orthopedic Surgery

## 2024-10-11 ENCOUNTER — Encounter (HOSPITAL_COMMUNITY): Payer: Self-pay | Admitting: Orthopedic Surgery

## 2024-10-11 DIAGNOSIS — T8131XA Disruption of external operation (surgical) wound, not elsewhere classified, initial encounter: Principal | ICD-10-CM | POA: Insufficient documentation

## 2024-10-11 DIAGNOSIS — E119 Type 2 diabetes mellitus without complications: Secondary | ICD-10-CM | POA: Diagnosis not present

## 2024-10-11 DIAGNOSIS — F418 Other specified anxiety disorders: Secondary | ICD-10-CM | POA: Diagnosis not present

## 2024-10-11 DIAGNOSIS — X58XXXA Exposure to other specified factors, initial encounter: Secondary | ICD-10-CM | POA: Diagnosis not present

## 2024-10-11 DIAGNOSIS — Z7984 Long term (current) use of oral hypoglycemic drugs: Secondary | ICD-10-CM | POA: Insufficient documentation

## 2024-10-11 DIAGNOSIS — T8130XA Disruption of wound, unspecified, initial encounter: Secondary | ICD-10-CM | POA: Diagnosis not present

## 2024-10-11 DIAGNOSIS — M86172 Other acute osteomyelitis, left ankle and foot: Principal | ICD-10-CM

## 2024-10-11 DIAGNOSIS — T8781 Dehiscence of amputation stump: Secondary | ICD-10-CM | POA: Diagnosis not present

## 2024-10-11 DIAGNOSIS — T148XXA Other injury of unspecified body region, initial encounter: Secondary | ICD-10-CM

## 2024-10-11 DIAGNOSIS — M6281 Muscle weakness (generalized): Secondary | ICD-10-CM | POA: Insufficient documentation

## 2024-10-11 DIAGNOSIS — Z794 Long term (current) use of insulin: Secondary | ICD-10-CM | POA: Insufficient documentation

## 2024-10-11 DIAGNOSIS — E1169 Type 2 diabetes mellitus with other specified complication: Secondary | ICD-10-CM | POA: Diagnosis not present

## 2024-10-11 HISTORY — PX: METATARSAL HEAD EXCISION: SHX5027

## 2024-10-11 HISTORY — PX: APPLICATION OF WOUND VAC: SHX5189

## 2024-10-11 HISTORY — PX: AMPUTATION: SHX166

## 2024-10-11 LAB — GLUCOSE, CAPILLARY
Glucose-Capillary: 195 mg/dL — ABNORMAL HIGH (ref 70–99)
Glucose-Capillary: 133 mg/dL — ABNORMAL HIGH (ref 70–99)
Glucose-Capillary: 214 mg/dL — ABNORMAL HIGH (ref 70–99)
Glucose-Capillary: 396 mg/dL — ABNORMAL HIGH (ref 70–99)

## 2024-10-11 LAB — CREATININE, SERUM
Creatinine, Ser: 0.74 mg/dL (ref 0.44–1.00)
GFR, Estimated: >60 mL/min (ref >60)

## 2024-10-11 LAB — CBC
HCT: 30.2 % — ABNORMAL LOW (ref 36.0–46.0)
Hemoglobin: 9.4 g/dL — ABNORMAL LOW (ref 12.0–15.0)
MCH: 26.7 pg (ref 26.0–34.0)
MCHC: 31.1 g/dL (ref 30.0–36.0)
MCV: 85.8 fL (ref 80.0–100.0)
Platelets: 345 K/uL (ref 150–400)
RBC: 3.52 MIL/uL — ABNORMAL LOW (ref 3.87–5.11)
RDW: 14.2 % (ref 11.5–15.5)
WBC: 8.4 K/uL (ref 4.0–10.5)
nRBC: 0 % (ref 0.0–0.2)

## 2024-10-11 LAB — HEMOGLOBIN A1C
Hgb A1c MFr Bld: 8.5 % — ABNORMAL HIGH (ref 4.8–5.6)
Mean Plasma Glucose: 197.25 mg/dL

## 2024-10-11 SURGERY — AMPUTATION, FOOT, RAY
Anesthesia: General | Site: Heel | Laterality: Right

## 2024-10-11 MED ORDER — METFORMIN HCL ER 500 MG PO TB24
1000.0000 mg | ORAL_TABLET | Freq: Two times a day (BID) | ORAL | Status: DC
  Administered 2024-10-11 – 2024-10-12 (×2): 1000 mg via ORAL
  Filled 2024-10-11 (×2): qty 2

## 2024-10-11 MED ORDER — BUPRENORPHINE HCL 300 MCG BU FILM
1.0000 | ORAL_FILM | Freq: Two times a day (BID) | BUCCAL | Status: DC
Start: 2024-10-11 — End: 2024-10-12

## 2024-10-11 MED ORDER — CHLORHEXIDINE GLUCONATE 0.12 % MT SOLN
15.0000 mL | Freq: Once | OROMUCOSAL | Status: AC
  Administered 2024-10-11: 15 mL via OROMUCOSAL
  Filled 2024-10-11: qty 15

## 2024-10-11 MED ORDER — ENOXAPARIN SODIUM 40 MG/0.4ML IJ SOSY
40.0000 mg | PREFILLED_SYRINGE | INTRAMUSCULAR | Status: DC
Start: 2024-10-12 — End: 2024-10-12

## 2024-10-11 MED ORDER — PREGABALIN 75 MG PO CAPS
300.0000 mg | ORAL_CAPSULE | Freq: Every day | ORAL | Status: DC
  Administered 2024-10-11: 300 mg via ORAL
  Filled 2024-10-11: qty 4

## 2024-10-11 MED ORDER — ONDANSETRON HCL 4 MG/2ML IJ SOLN: 4.0000 mg | Freq: Four times a day (QID) | INTRAMUSCULAR | Status: DC | PRN

## 2024-10-11 MED ORDER — ONDANSETRON HCL 4 MG/2ML IJ SOLN: 4.0000 mg | Freq: Once | INTRAMUSCULAR | Status: DC | PRN

## 2024-10-11 MED ORDER — DOCUSATE SODIUM 100 MG PO CAPS
100.0000 mg | ORAL_CAPSULE | Freq: Two times a day (BID) | ORAL | Status: DC
  Administered 2024-10-11 – 2024-10-12 (×2): 100 mg via ORAL
  Filled 2024-10-11 (×2): qty 1

## 2024-10-11 MED ORDER — ROSUVASTATIN CALCIUM 5 MG PO TABS
5.0000 mg | ORAL_TABLET | Freq: Every day | ORAL | Status: DC
  Administered 2024-10-12: 5 mg via ORAL
  Filled 2024-10-11: qty 1

## 2024-10-11 MED ORDER — METOCLOPRAMIDE HCL 5 MG PO TABS: 5.0000 mg | ORAL_TABLET | Freq: Three times a day (TID) | ORAL | Status: DC | PRN

## 2024-10-11 MED ORDER — FENTANYL CITRATE (PF) 100 MCG/2ML IJ SOLN: 25.0000 ug | INTRAMUSCULAR | Status: DC | PRN

## 2024-10-11 MED ORDER — METOCLOPRAMIDE HCL 5 MG/ML IJ SOLN: 5.0000 mg | Freq: Three times a day (TID) | INTRAMUSCULAR | Status: DC | PRN

## 2024-10-11 MED ORDER — PREGABALIN 75 MG PO CAPS
150.0000 mg | ORAL_CAPSULE | Freq: Every morning | ORAL | Status: DC
  Administered 2024-10-12: 150 mg via ORAL
  Filled 2024-10-11: qty 2

## 2024-10-11 MED ORDER — ORAL CARE MOUTH RINSE: 15.0000 mL | Freq: Once | OROMUCOSAL | Status: AC

## 2024-10-11 MED ORDER — CEFAZOLIN SODIUM-DEXTROSE 2-4 GM/100ML-% IV SOLN
2.0000 g | INTRAVENOUS | Status: AC
  Administered 2024-10-11: 2 g via INTRAVENOUS

## 2024-10-11 MED ORDER — FENTANYL CITRATE (PF) 100 MCG/2ML IJ SOLN
INTRAMUSCULAR | Status: AC
  Filled 2024-10-11: qty 2

## 2024-10-11 MED ORDER — SUCCINYLCHOLINE CHLORIDE 200 MG/10ML IV SOSY
PREFILLED_SYRINGE | INTRAVENOUS | Status: DC | PRN
  Administered 2024-10-11: 100 mg via INTRAVENOUS

## 2024-10-11 MED ORDER — LACTATED RINGERS IV SOLN: INTRAVENOUS | Status: DC

## 2024-10-11 MED ORDER — MIDAZOLAM HCL (PF) 2 MG/2ML IJ SOLN
INTRAMUSCULAR | Status: DC | PRN
  Administered 2024-10-11: 1 mg via INTRAVENOUS

## 2024-10-11 MED ORDER — EPHEDRINE SULFATE-NACL 50-0.9 MG/10ML-% IV SOSY
PREFILLED_SYRINGE | INTRAVENOUS | Status: DC | PRN
  Administered 2024-10-11: 7.5 mg via INTRAVENOUS
  Administered 2024-10-11 (×2): 5 mg via INTRAVENOUS

## 2024-10-11 MED ORDER — DEXAMETHASONE SOD PHOSPHATE PF 10 MG/ML IJ SOLN
INTRAMUSCULAR | Status: DC | PRN
  Administered 2024-10-11: 5 mg via INTRAVENOUS

## 2024-10-11 MED ORDER — DULOXETINE HCL 60 MG PO CPEP
60.0000 mg | ORAL_CAPSULE | Freq: Every day | ORAL | Status: DC
  Administered 2024-10-11: 60 mg via ORAL
  Filled 2024-10-11: qty 1

## 2024-10-11 MED ORDER — ACETAMINOPHEN 10 MG/ML IV SOLN: 1000.0000 mg | Freq: Once | INTRAVENOUS | Status: DC | PRN

## 2024-10-11 MED ORDER — ACETAMINOPHEN 325 MG PO TABS: 325.0000 mg | ORAL_TABLET | Freq: Four times a day (QID) | ORAL | Status: DC | PRN

## 2024-10-11 MED ORDER — AMISULPRIDE (ANTIEMETIC) 5 MG/2ML IV SOLN: 10.0000 mg | Freq: Once | INTRAVENOUS | Status: DC | PRN

## 2024-10-11 MED ORDER — INSULIN ASPART 100 UNIT/ML IJ SOLN
0.0000 [IU] | INTRAMUSCULAR | Status: DC | PRN
  Administered 2024-10-11: 2 [IU] via SUBCUTANEOUS
  Filled 2024-10-11: qty 1

## 2024-10-11 MED ORDER — VASHE WOUND IRRIGATION OPTIME
TOPICAL | Status: DC | PRN
  Administered 2024-10-11 (×2): 34 [oz_av]

## 2024-10-11 MED ORDER — LIDOCAINE 2% (20 MG/ML) 5 ML SYRINGE
INTRAMUSCULAR | Status: DC | PRN
  Administered 2024-10-11: 60 mg via INTRAVENOUS

## 2024-10-11 MED ORDER — ONDANSETRON HCL 4 MG/2ML IJ SOLN
INTRAMUSCULAR | Status: DC | PRN
  Administered 2024-10-11: 4 mg via INTRAVENOUS

## 2024-10-11 MED ORDER — PROPOFOL 10 MG/ML IV BOLUS
INTRAVENOUS | Status: DC | PRN
Start: 2024-10-11 — End: 2024-10-11
  Administered 2024-10-11: 200 mg via INTRAVENOUS

## 2024-10-11 MED ORDER — INSULIN GLARGINE-YFGN 100 UNIT/ML ~~LOC~~ SOLN
20.0000 [IU] | Freq: Every day | SUBCUTANEOUS | Status: DC
  Administered 2024-10-12: 20 [IU] via SUBCUTANEOUS
  Filled 2024-10-11: qty 0.2

## 2024-10-11 MED ORDER — ONDANSETRON HCL 4 MG PO TABS: 4.0000 mg | ORAL_TABLET | Freq: Four times a day (QID) | ORAL | Status: DC | PRN

## 2024-10-11 MED ORDER — MIDAZOLAM HCL 2 MG/2ML IJ SOLN
INTRAMUSCULAR | Status: AC
Start: 2024-10-11 — End: 2024-10-11
  Filled 2024-10-11: qty 2

## 2024-10-11 MED ORDER — PHENYLEPHRINE 80 MCG/ML (10ML) SYRINGE FOR IV PUSH (FOR BLOOD PRESSURE SUPPORT)
PREFILLED_SYRINGE | INTRAVENOUS | Status: DC | PRN
  Administered 2024-10-11: 160 ug via INTRAVENOUS
  Administered 2024-10-11: 120 ug via INTRAVENOUS
  Administered 2024-10-11: 160 ug via INTRAVENOUS
  Administered 2024-10-11: 80 ug via INTRAVENOUS
  Administered 2024-10-11: 160 ug via INTRAVENOUS

## 2024-10-11 MED ORDER — OXYCODONE HCL 5 MG PO TABS
5.0000 mg | ORAL_TABLET | ORAL | Status: DC | PRN
  Administered 2024-10-11: 10 mg via ORAL
  Filled 2024-10-11: qty 2

## 2024-10-11 MED ORDER — 0.9 % SODIUM CHLORIDE (POUR BTL) OPTIME
TOPICAL | Status: DC | PRN
  Administered 2024-10-11: 1000 mL

## 2024-10-11 MED ORDER — FENTANYL CITRATE (PF) 100 MCG/2ML IJ SOLN
INTRAMUSCULAR | Status: DC | PRN
Start: 2024-10-11 — End: 2024-10-11
  Administered 2024-10-11: 100 ug via INTRAVENOUS

## 2024-10-11 MED ORDER — CEFAZOLIN SODIUM-DEXTROSE 2-4 GM/100ML-% IV SOLN
2.0000 g | Freq: Four times a day (QID) | INTRAVENOUS | Status: AC
  Administered 2024-10-11 (×2): 2 g via INTRAVENOUS
  Filled 2024-10-11 (×2): qty 100

## 2024-10-11 MED ORDER — ACETAMINOPHEN 500 MG PO TABS
1000.0000 mg | ORAL_TABLET | Freq: Four times a day (QID) | ORAL | Status: DC
  Administered 2024-10-11 – 2024-10-12 (×3): 1000 mg via ORAL
  Filled 2024-10-11 (×3): qty 2

## 2024-10-11 MED ORDER — OXYCODONE HCL 5 MG PO TABS: 10.0000 mg | ORAL_TABLET | ORAL | Status: DC | PRN

## 2024-10-11 MED ORDER — INSULIN ASPART 100 UNIT/ML IJ SOLN
0.0000 [IU] | Freq: Three times a day (TID) | INTRAMUSCULAR | Status: DC
  Administered 2024-10-11: 5 [IU] via SUBCUTANEOUS
  Administered 2024-10-12: 15 [IU] via SUBCUTANEOUS

## 2024-10-11 MED ORDER — HYDROMORPHONE HCL 1 MG/ML IJ SOLN: 0.5000 mg | INTRAMUSCULAR | Status: DC | PRN

## 2024-10-11 SURGICAL SUPPLY — 46 items
BAG COUNTER SPONGE SURGICOUNT (BAG) ×3 IMPLANT
BLADE AVERAGE 25X9 (BLADE) IMPLANT
BLADE SAW SGTL MED 73X18.5 STR (BLADE) IMPLANT
BLADE SURG 21 STRL SS (BLADE) ×3 IMPLANT
BNDG COHESIVE 4X5 TAN STRL LF (GAUZE/BANDAGES/DRESSINGS) ×3 IMPLANT
BNDG COHESIVE 6X5 TAN NS LF (GAUZE/BANDAGES/DRESSINGS) IMPLANT
BNDG COHESIVE 6X5 TAN ST LF (GAUZE/BANDAGES/DRESSINGS) IMPLANT
BNDG COMPR ESMARK 4X3 LF (GAUZE/BANDAGES/DRESSINGS) ×3 IMPLANT
BNDG GAUZE DERMACEA FLUFF 4 (GAUZE/BANDAGES/DRESSINGS) ×3 IMPLANT
CLEANSER WND VASHE INSTL 34OZ (WOUND CARE) IMPLANT
CORD BIPOLAR FORCEPS 12FT (ELECTRODE) ×3 IMPLANT
COVER SURGICAL LIGHT HANDLE (MISCELLANEOUS) ×6 IMPLANT
CUFF TRNQT CYL 34X4.125X (TOURNIQUET CUFF) IMPLANT
DRAPE OEC MINIVIEW 54X84 (DRAPES) IMPLANT
DRAPE U-SHAPE 47X51 STRL (DRAPES) ×6 IMPLANT
DRSG ADAPTIC 3X8 NADH LF (GAUZE/BANDAGES/DRESSINGS) ×3 IMPLANT
DRSG VAC PEEL AND PLACE LRG (GAUZE/BANDAGES/DRESSINGS) IMPLANT
DURAPREP 26ML APPLICATOR (WOUND CARE) ×3 IMPLANT
ELECTRODE REM PT RTRN 9FT ADLT (ELECTROSURGICAL) ×3 IMPLANT
GAUZE PAD ABD 7.5X8 STRL (GAUZE/BANDAGES/DRESSINGS) IMPLANT
GAUZE PAD ABD 8X10 STRL (GAUZE/BANDAGES/DRESSINGS) ×6 IMPLANT
GAUZE SPONGE 4X4 12PLY STRL (GAUZE/BANDAGES/DRESSINGS) ×3 IMPLANT
GLOVE BIOGEL PI IND STRL 9 (GLOVE) ×3 IMPLANT
GLOVE SURG ORTHO 9.0 STRL STRW (GLOVE) ×3 IMPLANT
GOWN STRL REUS W/ TWL XL LVL3 (GOWN DISPOSABLE) ×6 IMPLANT
GRAFT SKIN WND MICRO 38 (Tissue) IMPLANT
KIT BASIN OR (CUSTOM PROCEDURE TRAY) ×3 IMPLANT
KIT DRSG PREVENA PLUS 7DAY 125 (MISCELLANEOUS) IMPLANT
KIT TURNOVER KIT B (KITS) ×3 IMPLANT
MANIFOLD NEPTUNE II (INSTRUMENTS) ×3 IMPLANT
NDL HYPO 25GX1X1/2 BEV (NEEDLE) IMPLANT
NEEDLE HYPO 25GX1X1/2 BEV (NEEDLE) IMPLANT
PACK ORTHO EXTREMITY (CUSTOM PROCEDURE TRAY) ×3 IMPLANT
PAD ARMBOARD POSITIONER FOAM (MISCELLANEOUS) ×6 IMPLANT
PAD CAST 4YDX4 CTTN HI CHSV (CAST SUPPLIES) IMPLANT
SOLN 0.9% NACL POUR BTL 1000ML (IV SOLUTION) ×3 IMPLANT
SOLN STERILE WATER BTL 1000 ML (IV SOLUTION) ×3 IMPLANT
STOCKINETTE IMPERVIOUS LG (DRAPES) IMPLANT
SUCTION TUBE FRAZIER 10FR DISP (SUCTIONS) IMPLANT
SUT ETHILON 2 0 PSLX (SUTURE) ×3 IMPLANT
SUT VIC AB 2-0 FS1 27 (SUTURE) IMPLANT
SYR CONTROL 10ML LL (SYRINGE) IMPLANT
TOWEL GREEN STERILE (TOWEL DISPOSABLE) ×3 IMPLANT
TOWEL GREEN STERILE FF (TOWEL DISPOSABLE) ×3 IMPLANT
TUBE CONNECTING 12X1/4 (SUCTIONS) ×3 IMPLANT
YANKAUER SUCT BULB TIP NO VENT (SUCTIONS) ×3 IMPLANT

## 2024-10-11 NOTE — Anesthesia Procedure Notes (Signed)
 Procedure Name: Intubation Date/Time: 10/11/2024 12:28 PM  Performed by: Delores Duwaine SAUNDERS, CRNAPre-anesthesia Checklist: Patient identified, Emergency Drugs available, Suction available and Patient being monitored Patient Re-evaluated:Patient Re-evaluated prior to induction Oxygen Delivery Method: Circle System Utilized Preoxygenation: Pre-oxygenation with 100% oxygen Induction Type: IV induction Ventilation: Mask ventilation without difficulty Laryngoscope Size: Mac and 3 Grade View: Grade II Tube type: Oral Number of attempts: 1 Airway Equipment and Method: Stylet and Oral airway Placement Confirmation: ETT inserted through vocal cords under direct vision, positive ETCO2 and breath sounds checked- equal and bilateral Secured at: 21 cm Tube secured with: Tape Dental Injury: Teeth and Oropharynx as per pre-operative assessment

## 2024-10-11 NOTE — Transfer of Care (Signed)
 Immediate Anesthesia Transfer of Care Note  Patient: Rhonda Thomas  Procedure(s) Performed: AMPUTATION, FOOT, RAY (Right) EXCISION, LEFT CALCANEOUS BONE (Left: Heel) APPLICATION, WOUND VAC (Left: Foot)  Patient Location: PACU  Anesthesia Type:General  Level of Consciousness: awake and alert   Airway & Oxygen Therapy: Patient Spontanous Breathing  Post-op Assessment: Report given to RN  Post vital signs: Reviewed and stable  Last Vitals:  Vitals Value Taken Time  BP 115/64 10/11/24 13:30  Temp    Pulse 77 10/11/24 13:31  Resp 12 10/11/24 13:31  SpO2 91 % 10/11/24 13:31  Vitals shown include unfiled device data.  Last Pain:  Vitals:   10/11/24 1326  TempSrc:   PainSc: 0-No pain         Complications: No notable events documented.

## 2024-10-11 NOTE — Progress Notes (Signed)
 Orthopedic Tech Progress Note Patient Details:  Rhonda Thomas [birth year] [id number]  Ortho Devices Type of Ortho Device: Postop shoe/boot Ortho Device/Splint Location: LLE Ortho Device/Splint Interventions: Ordered   Post Interventions Patient Tolerated: Well  [name] A Aviyon Hocevar 10/11/2024, 3:29 PM

## 2024-10-11 NOTE — Op Note (Signed)
 10/11/2024  1:27 PM  [name]    PRE-OPERATIVE DIAGNOSIS:  Wound Dehiscence Bilateral with Exposed Bone  POST-OPERATIVE DIAGNOSIS:  Same  PROCEDURE:  AMPUTATION, right FOOT, first RAY, EXCISION, LEFT CALCANEOUS BONE,  APPLICATION Kerecis micro graft 38 cm divided evenly between the 2 wounds. Peel in place WOUND VAC left heel Local tissue transfer for wound closure on the right 10 x 4 cm and local tissue transfer for wound closure on the left 10 x 5 cm.  SURGEON:  Jerona LULLA Sage, MD  PHYSICIAN ASSISTANT:None ANESTHESIA:   General  PREOPERATIVE INDICATIONS:  Rhonda Thomas is a  68 y.o. female with a diagnosis of Wound Dehiscence Bilateral with Exposed Bone who failed conservative measures and elected for surgical management.    The risks benefits and alternatives were discussed with the patient preoperatively including but not limited to the risks of infection, bleeding, nerve injury, cardiopulmonary complications, the need for revision surgery, among others, and the patient was willing to proceed.  OPERATIVE IMPLANTS:   Implant Name Type Inv. Item Serial No. Manufacturer Lot No. LRB No. Used Action  GRAFT SKIN WND MICRO 38 - [id number] Tissue GRAFT SKIN WND MICRO 38  [employer] [id number] Bilateral 1 Implanted    @ENCIMAGES @  OPERATIVE FINDINGS: No abscess or infection in either wound.  Wound dehiscence secondary to increased stress across the wound  OPERATIVE PROCEDURE: Patient was brought the operating room and underwent a general anesthetic.  After adequate levels anesthesia were obtained both lower extremities were prepped using DuraPrep draped into a sterile field a timeout was called.  Attention was first focused on the right lower extremity.  An elliptical incision was made around the wound breakdown and the first ray was resected through the base of the first metatarsal.  Electrocautery was used for hemostasis.  The wound was irrigated with  Vashe.  The tissue edges were undermined to allow for tissue transfer for wound closure 10 x 4 cm.  Wound closed with 2-0 nylon and a sterile dressing was applied.  Attention was then placed on the left lower extremity.  Elliptical incision was made around the heel ulcer and a oscillating saw was used to resect the distal 2 cm of calcaneus.  Electrocautery was used for hemostasis the wound was irrigated with normal saline.  Tissue margins were undermined to allow for wound closure 10 x 5 cm.  A Prevena peel in place wound VAC sponge was applied this had a good suction fit patient was extubated taken the PACU in stable condition   DISCHARGE PLANNING:  Antibiotic duration: 24 hours of antibiotics  Weightbearing: Weightbearing for transfer.  PRAFO on the left and postop shoe on the right  Pain medication: Opioid pathway  Dressing care/ Wound VAC: Dry dressing on the right wound VAC on the left  Ambulatory devices: Transfers only  Discharge to: Home.  Follow-up: In the office 1 week post operative.

## 2024-10-11 NOTE — H&P (Signed)
 Rhonda Thomas is an 68 y.o. female.   Chief Complaint: wound dehiscence and exposed bone in both feet.  HPI:  Rhonda Thomas is a 68 year old female with chronic foot wounds who presents with wound dehiscence and exposed bone in both feet.   She has a history of poor healing attributed to 'bad microscopic circulation', leading to difficulty in wound healing. Currently, she has a dehisced wound on the right foot with the first metatarsal exposed and a dehisced wound on the left heel with the calcaneus exposed. These issues have arisen six weeks post her initial surgery.   She has been making efforts to stay off her feet as much as possible, utilizing a wheelchair to aid in this effort. She is concerned about whether she has done anything wrong or needs to alter her care routine, indicating her commitment to following care instructions.   She uses a small white pad around the wound to absorb drainage and is worried it might affect healing. She is running low on dressings and needs to order more supplies, specifically four by four gauze, Ace wrap, and Vashe, a sodium hypochlorite solution.    Past Medical History:  Diagnosis Date   Anxiety    Attention deficit disorder (ADD)    Chronic foot pain 09/17/2024   bilateral - hx chronic foot ulcers   Depression    Diabetes (HCC)    Hiatal hernia    HLD (hyperlipidemia)    no meds, diet controlled per pt on 1014/25   Movement disorder    Neuropathy of both feet 08/21/2024   in [facility]   Osteomyelitis (HCC) 09/17/2024   left calcaneus and great toe of right foot   Restless legs syndrome (RLS) 08/21/2024   in [facility]   Schatzki's ring    Sleep apnea    does not use CPAP - intolerance to mask    Past Surgical History:  Procedure Laterality Date   ABDOMINAL HYSTERECTOMY     AMPUTATION TOE Right 09/25/2024   Procedure: AMPUTATION, TOE;  Surgeon: Harden Jerona GAILS, MD;  Location: [facility] OR;  Service: Orthopedics;  Laterality: Right;  RIGHT GREAT  TOE AMPUTATION   APPLICATION OF WOUND VAC N/A 09/25/2024   Procedure: APPLICATION, WOUND VAC;  Surgeon: Harden Jerona GAILS, MD;  Location: [facility] OR;  Service: Orthopedics;  Laterality: N/A;   BREAST LUMPECTOMY WITH RADIOACTIVE SEED LOCALIZATION Right 04/25/2016   Procedure: RIGHT BREAST LUMPECTOMY WITH RADIOACTIVE SEED LOCALIZATION;  Surgeon: Deward Null III, MD;  Location: [facility];  Service: General;  Laterality: Right;   CATARACT EXTRACTION, BILATERAL Bilateral    CHOLECYSTECTOMY     COLONOSCOPY  2013   FOOT SURGERY Left 05/2024   at [facility]   METATARSAL HEAD EXCISION Left 09/25/2024   Procedure: EXCISION, METATARSAL BONE, HEAD;  Surgeon: Harden Jerona GAILS, MD;  Location: [facility] OR;  Service: Orthopedics;  Laterality: Left;  LEFT PARTIAL CALCANEOUS EXCISION   RECTOCELE REPAIR     SPINAL CORD STIMULATOR IMPLANT     not currently working per pt on 09/24/24   TOE AMPUTATION Right    2nd toe rt foot   TUBAL LIGATION     UPPER GI ENDOSCOPY  2016    Family History  Problem Relation Age of Onset   Stroke Mother    Sleep apnea Mother    Diabetes Father    Heart disease Father        pacemaker   Sleep apnea Father    Diabetes Other  Heart Problems Other    Colon cancer Neg Hx    Breast cancer Neg Hx    Social History:  reports that she has never smoked. She has never used smokeless tobacco. She reports that she does not drink alcohol and does not use drugs.  Allergies:  Allergies  Allergen Reactions   Canagliflozin  Other (See Comments)    vaginitis    Pioglitazone Other (See Comments)    Edema     Medications Prior to Admission  Medication Sig Dispense Refill   BELBUCA 300 MCG FILM Take 1 Film by mouth every 12 (twelve) hours.     DULoxetine  (CYMBALTA ) 60 MG capsule Take 1 capsule (60 mg total) by mouth at bedtime. 90 capsule 3   insulin  aspart (NOVOLOG  FLEXPEN) 100 UNIT/ML FlexPen Inject 3-20 Units into the skin 3 (three) times daily with meals. (Patient taking  differently: Inject 3-20 Units into the skin 2 (two) times daily.) 30 mL 3   insulin  glargine (LANTUS  SOLOSTAR) 100 UNIT/ML Solostar Pen Inject 30 Units into the skin daily. (Patient taking differently: Inject 28 Units into the skin daily.) 15 mL 3   metFORMIN  (GLUCOPHAGE -XR) 500 MG 24 hr tablet Take 2 tablets (1,000 mg total) by mouth 2 (two) times daily with a meal. 180 tablet 6   pregabalin  (LYRICA ) 150 MG capsule 1 in am 2 at night 270 capsule 0   rosuvastatin  (CRESTOR ) 5 MG tablet Take 1 tablet (5 mg total) by mouth daily. 90 tablet 3   Continuous Glucose Sensor (FREESTYLE LIBRE 3 PLUS SENSOR) MISC Change sensor every 15 days. DX:E11.65 6 each 3   tirzepatide (MOUNJARO) 5 MG/0.5ML Pen Inject 5 mg into the skin once a week. DX: E11.65, mail order 2 mL 2   TRULICITY  4.5 MG/0.5ML SOAJ Inject 4.5 mg into the skin every 7 (seven) days. (Patient not taking: Reported on 09/23/2024)      Results for orders placed or performed during the hospital encounter of 10/11/24 (from the past 48 hours)  Glucose, capillary     Status: Abnormal   Collection Time: 10/11/24 10:55 AM  Result Value Ref Range   Glucose-Capillary 195 (H) 70 - 99 mg/dL    Comment: Glucose reference range applies only to samples taken after fasting for at least 8 hours.   No results found.  Review of Systems  All other systems reviewed and are negative.   Blood pressure 113/63, pulse 84, temperature 97.8 F (36.6 C), temperature source Oral, resp. rate 20, height 5' 9 (1.753 m), weight 87.5 kg, SpO2 97%. Physical Exam   Patient is alert, oriented, no adenopathy, well-dressed, normal affect, normal respiratory effort. Physical Exam CARDIOVASCULAR: Strong palpable dorsalis pedis pulse bilaterally. EXTREMITIES: Complete wound dehiscence on right great toe amputation with exposed first metatarsal head. Dehiscence of calcaneal excision with exposed calcaneus on left foot. SKIN: No ascending cellulitis, no purulent  drainage.  Assessment/Plan Dehisced right great toe amputation wound with exposed first metatarsal The wound dehisced, exposing the first metatarsal, likely due to poor microscopic circulation despite good ankle pulses. Surgical intervention necessary to prevent complications. Consider foot amputation if healing fails, but good pulses warrant another attempt to save the foot. - Perform first ray amputation on the right foot tomorrow. - Observe overnight post-surgery. - Discharge home with a portable wound vac. - Schedule follow-up in one week for wound vac change. - Instruct to use Vashe dressing changes after wound vac removal.   Dehisced left calcaneal excision wound with exposed calcaneus The wound  dehisced, exposing the calcaneus, likely due to poor microscopic circulation despite good ankle pulses. Surgical intervention necessary to prevent complications. Consider foot amputation if healing fails, but good pulses warrant another attempt to save the foot. - Perform repeat partial calcaneal excision on the left foot tomorrow. - Observe overnight post-surgery. - Discharge home with a portable wound vac. - Schedule follow-up in one week for wound vac change. - Instruct to use Vashe dressing changes after wound vac removal.  Maurilio Deland Collet, PA-C 10/11/2024, 12:05 PM

## 2024-10-11 NOTE — Anesthesia Preprocedure Evaluation (Addendum)
 Anesthesia Evaluation  Patient identified by MRN, date of birth, ID band Patient awake    Reviewed: Allergy & Precautions, NPO status , Patient's Chart, lab work & pertinent test results  Airway Mallampati: II  TM Distance: >3 FB Neck ROM: Full    Dental no notable dental hx.    Pulmonary sleep apnea    Pulmonary exam normal        Cardiovascular negative cardio ROS Normal cardiovascular exam     Neuro/Psych  PSYCHIATRIC DISORDERS Anxiety Depression    Chronic foot pain Neuropathy of both feet  Neuromuscular disease    GI/Hepatic hiatal hernia,,,(+)     substance abuse    Endo/Other  diabetes, Insulin  Dependent  Patient on GLP-1 Agonist. Last dose 10/27  Renal/GU negative Renal ROS     Musculoskeletal negative musculoskeletal ROS (+)  narcotic dependent  Abdominal   Peds  Hematology negative hematology ROS (+)   Anesthesia Other Findings Wound Dehiscence Bilateral with Exposed Bone  Reproductive/Obstetrics                              Anesthesia Physical Anesthesia Plan  ASA: 3  Anesthesia Plan: General   Post-op Pain Management:    Induction: Intravenous  PONV Risk Score and Plan: 3 and Ondansetron , Dexamethasone , Midazolam  and Treatment may vary due to age or medical condition  Airway Management Planned: Oral ETT  Additional Equipment:   Intra-op Plan:   Post-operative Plan: Extubation in OR  Informed Consent: I have reviewed the patients History and Physical, chart, labs and discussed the procedure including the risks, benefits and alternatives for the proposed anesthesia with the patient or authorized representative who has indicated his/her understanding and acceptance.     Dental advisory given  Plan Discussed with: CRNA  Anesthesia Plan Comments:          Anesthesia Quick Evaluation

## 2024-10-11 NOTE — Plan of Care (Signed)

## 2024-10-11 NOTE — Care Plan (Signed)
 Patient arrived in the unit via bed. No complaints of pain. VS taken and recorded. Needs attended. Call bell within reached. Bed alarm on.

## 2024-10-12 ENCOUNTER — Encounter (HOSPITAL_COMMUNITY): Payer: Self-pay | Admitting: Orthopedic Surgery

## 2024-10-12 DIAGNOSIS — T8131XA Disruption of external operation (surgical) wound, not elsewhere classified, initial encounter: Secondary | ICD-10-CM | POA: Diagnosis not present

## 2024-10-12 LAB — GLUCOSE, CAPILLARY: Glucose-Capillary: 370 mg/dL — ABNORMAL HIGH (ref 70–99)

## 2024-10-12 NOTE — Evaluation (Signed)
 Physical Therapy Evaluation Patient Details Name: Rhonda Thomas MRN: [id number] DOB: [birth year] Today's Date: 10/12/2024  History of Present Illness  Pt is a 68 y.o. 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 PMHx: ADD, anxiety, depression, chronic foot pain, DM, neuropahy, RLS, hiatal hernia, HLD, and OSA.  Clinical Impression  PTA, patient lived with spouse in one level home with 3 STE. Since previous procedure, patient was ambulating short distances with 2WW vs using wheelchair for mobility. Patient presents today with generalized weakness B LE and minimal impairments in functional mobility. Currently modI for bed mobility and supervision for squat pivot transfers to/from chair. Patient educated on updated recommendations per MD including using R LE post op shoe and PRAFO LLE. Instructed for safety with completion of transfers only upon discharge home. Education on importance of LE elevation and HEP for improving strength/circulation during recovery. Patient will continue to benefit from skilled acute PT services to address the above impairments. Recommend d/c home with initial 24/7 supervision/assist and will follow physician recommendations for f/u therapies when appropriate.         If plan is discharge home, recommend the following: A little help with walking and/or transfers;A little help with bathing/dressing/bathroom;Assistance with cooking/housework;Assist for transportation;Help with stairs or ramp for entrance   Can travel by private vehicle        Equipment Recommendations Other (comment) (Patient has necessary equipment. Would benefit from Bilateral elevating leg rests for manual wheelchair as patient reports she does not have.)  Recommendations for Other Services       Functional Status Assessment Patient has had a recent decline in their functional status and demonstrates the ability to make significant  improvements in function in a reasonable and predictable amount of time.     Precautions / Restrictions Precautions Precautions: Fall Recall of Precautions/Restrictions: Intact Precaution/Restrictions Comments: Wound Vac LLE; Elevated LE and minimize weight bearing Other Brace: LLE PRAFO at all times; R LE post op shoe for transfers Restrictions Weight Bearing Restrictions Per Provider Order: Yes RLE Weight Bearing Per Provider Order: Weight bearing as tolerated LLE Weight Bearing Per Provider Order: Weight bearing as tolerated Other Position/Activity Restrictions: for transfers only      Mobility  Bed Mobility Overal bed mobility: Modified Independent Bed Mobility: Supine to Sit, Sit to Supine     Supine to sit: Modified independent (Device/Increase time) Sit to supine: Modified independent (Device/Increase time)   General bed mobility comments: HOB elevated    Transfers Overall transfer level: Needs assistance   Transfers: Bed to chair/wheelchair/BSC       Squat pivot transfers: Supervision     General transfer comment: supervision for safety and set up of transfer    Ambulation/Gait               General Gait Details: not assessed, patient is transfers only  Stairs Stairs:  (discussed safe wheelchair mobility for ascending stairs when returning to home with husband, recommend +2 assist)          Wheelchair Mobility     Tilt Bed    Modified Rankin (Stroke Patients Only)       Balance Overall balance assessment: Mild deficits observed, not formally tested Sitting-balance support: Feet supported Sitting balance-Leahy Scale: Good Sitting balance - Comments: EOB balance independent       Standing balance comment: Patient did not participate in full standing, participated in squat pivot transfers only  Pertinent Vitals/Pain Pain Assessment Pain Assessment: No/denies pain    Home Living  Family/patient expects to be discharged to:: Private residence Living Arrangements: Spouse/significant other Available Help at Discharge: Family;Available 24 hours/day Type of Home: House Home Access: Stairs to enter Entrance Stairs-Rails: None Entrance Stairs-Number of Steps: 3   Home Layout: One level Home Equipment: [employer] (2 wheels);Shower seat;Hand held shower head;BSC/3in1;Wheelchair - manual      Prior Function Prior Level of Function : Independent/Modified Independent;Driving             Mobility Comments: At baseline, patient ambulates with 2WW. Since most recent surgery, has been performing short distance ambulation and occasional wheelchair mobility.       Extremity/Trunk Assessment        Lower Extremity Assessment Lower Extremity Assessment: Generalized weakness RLE Sensation: decreased proprioception;decreased light touch LLE Sensation: decreased light touch;decreased proprioception    Cervical / Trunk Assessment Cervical / Trunk Assessment: Normal  Communication   Communication Communication: No apparent difficulties    Cognition Arousal: Alert Behavior During Therapy: WFL for tasks assessed/performed   PT - Cognitive impairments: No apparent impairments                         Following commands: Intact       Cueing Cueing Techniques: Verbal cues     General Comments General comments (skin integrity, edema, etc.): Education regarding limiting weight bearing, transfer safety, w/c mobility safety, benefits of HEP for strengthening and blood flow, elevating LE when resting    Exercises     Assessment/Plan    PT Assessment Patient needs continued PT services  PT Problem List Decreased strength;Decreased range of motion;Decreased activity tolerance;Decreased balance;Decreased mobility;Decreased knowledge of use of DME;Decreased safety awareness;Pain       PT Treatment Interventions DME instruction;Gait training;Stair  training;Functional mobility training;Therapeutic activities;Therapeutic exercise;Balance training;Neuromuscular re-education;Patient/family education;Wheelchair mobility training    PT Goals (Current goals can be found in the Care Plan section)  Acute Rehab PT Goals Patient Stated Goal: Return Home PT Goal Formulation: With patient Time For Goal Achievement: 10/26/24 Potential to Achieve Goals: Good    Frequency Min 2X/week     Co-evaluation PT/OT/SLP Co-Evaluation/Treatment: Yes Reason for Co-Treatment: Complexity of the patient's impairments (multi-system involvement) PT goals addressed during session: Mobility/safety with mobility         AM-PAC PT 6 Clicks Mobility  Outcome Measure Help needed turning from your back to your side while in a flat bed without using bedrails?: None Help needed moving from lying on your back to sitting on the side of a flat bed without using bedrails?: None Help needed moving to and from a bed to a chair (including a wheelchair)?: A Little Help needed standing up from a chair using your arms (e.g., wheelchair or bedside chair)?: A Little Help needed to walk in hospital room?: Total Help needed climbing 3-5 steps with a railing? : Total 6 Click Score: 16    End of Session Equipment Utilized During Treatment: Other (comment) (PRAFO L foot and post op shoe L foot) Activity Tolerance: Patient tolerated treatment well Patient left: with call bell/phone within reach;in chair (LE elevated) Nurse Communication: Mobility status;Weight bearing status PT Visit Diagnosis: Difficulty in walking, not elsewhere classified (R26.2);Other abnormalities of gait and mobility (R26.89);Unsteadiness on feet (R26.81)    Time: 8993-8974 PT Time Calculation (min) (ACUTE ONLY): 19 min   Charges:   PT Evaluation $PT Eval Moderate Complexity: 1 Mod  PT General Charges $$ ACUTE PT VISIT: 1 Visit         Sherryle Placitas, PT, DPT [facility] Acute  Rehabilitation Office: [phone]   Sherryle VEAR [place] 10/12/2024, 10:43 AM

## 2024-10-12 NOTE — Plan of Care (Signed)
 AVS printed out and instructed to patient with no questions asked. No complaints of pain. Needs attended. PIV removed. Still waiting for the prevena plus canister to be changed before patient leaves. Will DC with wound vac. Dressing C/D/I. Problem: Education: Goal: Knowledge of General Education information will improve Description: Including pain rating scale, medication(s)/side effects and non-pharmacologic comfort measures Outcome: Progressing   Problem: Health Behavior/Discharge Planning: Goal: Ability to manage health-related needs will improve Outcome: Progressing   Problem: Clinical Measurements: Goal: Ability to maintain clinical measurements within normal limits will improve Outcome: Progressing Goal: Will remain free from infection Outcome: Progressing Goal: Diagnostic test results will improve Outcome: Progressing Goal: Respiratory complications will improve Outcome: Progressing Goal: Cardiovascular complication will be avoided Outcome: Progressing   Problem: Activity: Goal: Risk for activity intolerance will decrease Outcome: Progressing   Problem: Nutrition: Goal: Adequate nutrition will be maintained Outcome: Progressing   Problem: Coping: Goal: Level of anxiety will decrease Outcome: Progressing   Problem: Elimination: Goal: Will not experience complications related to bowel motility Outcome: Progressing Goal: Will not experience complications related to urinary retention Outcome: Progressing   Problem: Pain Managment: Goal: General experience of comfort will improve and/or be controlled Outcome: Progressing   Problem: Safety: Goal: Ability to remain free from injury will improve Outcome: Progressing   Problem: Skin Integrity: Goal: Risk for impaired skin integrity will decrease Outcome: Progressing   Problem: Education: Goal: Ability to describe self-care measures that may prevent or decrease complications (Diabetes Survival Skills Education) will  improve Outcome: Progressing Goal: Individualized Educational Video(s) Outcome: Progressing   Problem: Coping: Goal: Ability to adjust to condition or change in health will improve Outcome: Progressing   Problem: Fluid Volume: Goal: Ability to maintain a balanced intake and output will improve Outcome: Progressing   Problem: Health Behavior/Discharge Planning: Goal: Ability to identify and utilize available resources and services will improve Outcome: Progressing Goal: Ability to manage health-related needs will improve Outcome: Progressing   Problem: Metabolic: Goal: Ability to maintain appropriate glucose levels will improve Outcome: Progressing   Problem: Nutritional: Goal: Maintenance of adequate nutrition will improve Outcome: Progressing Goal: Progress toward achieving an optimal weight will improve Outcome: Progressing   Problem: Skin Integrity: Goal: Risk for impaired skin integrity will decrease Outcome: Progressing   Problem: Tissue Perfusion: Goal: Adequacy of tissue perfusion will improve Outcome: Progressing

## 2024-10-12 NOTE — Progress Notes (Signed)
 Patient ID: Rhonda Thomas, female   DOB: [birth year], 68 y.o.   MRN: [id number] Patient is postoperative day 1 revision partial calcaneal excision on the left and first ray amputation on the right.  Patient was instructed to wear the PRAFO 24 hours a day on the left foot and use the postop shoe on the right foot for transfers.  Patient is to elevate her legs and minimize weightbearing.  Patient will need a new canister for the Prevena plus pump at the time of discharge.  She will not need a new pump just a new canister.  Patient is on a pain management regimen at home and does not need pain prescriptions.  Plan for discharge today.

## 2024-10-12 NOTE — Care Management Obs Status (Signed)
 MEDICARE OBSERVATION STATUS NOTIFICATION   Patient Details  Name: Rhonda Thomas MRN: [id number] Date of Birth: May 12, 1956   Medicare Observation Status Notification Given:  Yes    Robynn Eileen Hoose, RN 10/12/2024, 9:19 AM

## 2024-10-12 NOTE — Care Plan (Signed)
 Canister changed as per MD's note.

## 2024-10-12 NOTE — Evaluation (Signed)
 Occupational Therapy Evaluation Patient Details Name: Rhonda Thomas MRN: [id number] DOB: Apr 13, 1956 Today's Date: 10/12/2024   History of Present Illness   Pt is a 67 y.o. 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 PMHx: ADD, anxiety, depression, chronic foot pain, DM, neuropahy, RLS, hiatal hernia, HLD, and OSA.     Clinical Impressions Pt ind at baseline with ADLs and was using w/c at home since last admission. Pt educated about new precautions and wear schedule for RLE postop shoe and LLE PRAFO, along with transfers only per MD order. Pt needs mod I for bed mobility and supervision for squat pivot transfers at this time. Educated pt on keeping BLE elevated and compensatory strategies for LB ADLs. Pt presenting with impairments listed below, will follow acutely. Follow physician recs for f/u therapies.      If plan is discharge home, recommend the following:   A little help with walking and/or transfers;A little help with bathing/dressing/bathroom;Assistance with cooking/housework;Assist for transportation;Help with stairs or ramp for entrance     Functional Status Assessment   Patient has had a recent decline in their functional status and demonstrates the ability to make significant improvements in function in a reasonable and predictable amount of time.     Equipment Recommendations   None recommended by OT (pt has all needed DME)     Recommendations for Other Services   PT consult     Precautions/Restrictions   Precautions Precautions: Fall Precaution/Restrictions Comments: LLE wound vac Required Braces or Orthoses: Other Brace Other Brace: LLE PRAFO at all times, RLE postop shoe for mobility Restrictions Weight Bearing Restrictions Per Provider Order: Yes RLE Weight Bearing Per Provider Order: Weight bearing as tolerated LLE Weight Bearing Per Provider Order: Weight bearing as  tolerated Other Position/Activity Restrictions: for transfers only     Mobility Bed Mobility Overal bed mobility: Modified Independent Bed Mobility: Supine to Sit, Sit to Supine     Supine to sit: Modified independent (Device/Increase time) Sit to supine: Modified independent (Device/Increase time)        Transfers Overall transfer level: Needs assistance   Transfers: Bed to chair/wheelchair/BSC     Squat pivot transfers: Supervision              Balance Overall balance assessment: Mild deficits observed, not formally tested                                         ADL either performed or assessed with clinical judgement   ADL Overall ADL's : Needs assistance/impaired Eating/Feeding: Independent   Grooming: Supervision/safety;Sitting   Upper Body Bathing: Contact guard assist;Sitting   Lower Body Bathing: Contact guard assist;Sitting/lateral leans   Upper Body Dressing : Contact guard assist;Sitting   Lower Body Dressing: Contact guard assist;Sitting/lateral leans   Toilet Transfer: Supervision/safety;Squat-pivot;BSC/3in1   Toileting- Clothing Manipulation and Hygiene: Supervision/safety       Functional mobility during ADLs: Supervision/safety       Vision   Vision Assessment?: No apparent visual deficits     Perception Perception: Not tested       Praxis Praxis: Not tested       Pertinent Vitals/Pain Pain Assessment Pain Assessment: No/denies pain     Extremity/Trunk Assessment Upper Extremity Assessment Upper Extremity Assessment: Overall WFL for tasks assessed   Lower Extremity Assessment Lower Extremity Assessment: Defer to  PT evaluation RLE Sensation: decreased proprioception;decreased light touch LLE Sensation: decreased light touch;decreased proprioception   Cervical / Trunk Assessment Cervical / Trunk Assessment: Normal   Communication Communication Communication: No apparent difficulties   Cognition  Arousal: Alert Behavior During Therapy: WFL for tasks assessed/performed Cognition: No apparent impairments                               Following commands: Intact       Cueing  General Comments   Cueing Techniques: Verbal cues  VSS   Exercises     Shoulder Instructions      Home Living Family/patient expects to be discharged to:: Private residence Living Arrangements: Spouse/significant other Available Help at Discharge: Family;Available 24 hours/day Type of Home: House Home Access: Stairs to enter [employer] of Steps: 3 Entrance Stairs-Rails: None Home Layout: One level     Bathroom Shower/Tub: Producer, Television/film/video: Standard Bathroom Accessibility: No   Home Equipment: Agricultural Consultant (2 wheels);Shower seat;Hand held shower head;BSC/3in1;Wheelchair - manual          Prior Functioning/Environment Prior Level of Function : Independent/Modified Independent;Driving             Mobility Comments: At baseline, patient ambulates with 2WW. Since most recent surgery, has been performing short distance ambulation and occasional wheelchair mobility. ADLs Comments: ind    OT Problem List: Impaired balance (sitting and/or standing);Decreased knowledge of use of DME or AE;Impaired sensation   OT Treatment/Interventions: Self-care/ADL training;Therapeutic activities;DME and/or AE instruction;Patient/family education;Balance training      OT Goals(Current goals can be found in the care plan section)   Acute Rehab OT Goals Patient Stated Goal: none stated OT Goal Formulation: With patient Time For Goal Achievement: 10/26/24 Potential to Achieve Goals: Good   OT Frequency:  Min 2X/week    Co-evaluation   Reason for Co-Treatment: Complexity of the patient's impairments (multi-system involvement) PT goals addressed during session: Mobility/safety with mobility        AM-PAC OT 6 Clicks Daily Activity     Outcome  Measure Help from another person eating meals?: None Help from another person taking care of personal grooming?: A Little Help from another person toileting, which includes using toliet, bedpan, or urinal?: A Little Help from another person bathing (including washing, rinsing, drying)?: A Little Help from another person to put on and taking off regular upper body clothing?: A Little Help from another person to put on and taking off regular lower body clothing?: A Little 6 Click Score: 19   End of Session Nurse Communication: Mobility status  Activity Tolerance: Patient tolerated treatment well Patient left: in chair;with call bell/phone within reach  OT Visit Diagnosis: Unsteadiness on feet (R26.81);Muscle weakness (generalized) (M62.81)                Time: 8994-8974 OT Time Calculation (min): 20 min Charges:  OT General Charges $OT Visit: 1 Visit OT Evaluation $OT Eval Low Complexity: 1 Low  Derion Kreiter K, OTD, OTR/L SecureChat Preferred Acute Rehab [phone]   Maripat Borba K Koonce 10/12/2024, 11:07 AM

## 2024-10-14 ENCOUNTER — Encounter: Payer: Self-pay | Admitting: Radiology

## 2024-10-14 ENCOUNTER — Telehealth: Payer: Self-pay | Admitting: Orthopedic Surgery

## 2024-10-14 ENCOUNTER — Ambulatory Visit (INDEPENDENT_AMBULATORY_CARE_PROVIDER_SITE_OTHER): Admitting: Orthopedic Surgery

## 2024-10-14 DIAGNOSIS — T8781 Dehiscence of amputation stump: Secondary | ICD-10-CM

## 2024-10-14 NOTE — Discharge Summary (Signed)
 Physician Discharge Summary  Patient ID: Rhonda Thomas MRN: [id number] DOB/AGE: November 12, 1956 68 y.o.  Admit date: 10/11/2024 Discharge date: 10/14/2024  Admission Diagnoses:  Principal Problem:   Surgical wound breakdown Active Problems:   Dehiscence of amputation stump of left lower extremity (HCC)   Dehiscence of amputation stump of right lower extremity [facility])   Discharge Diagnoses:  Same  Past Medical History:  Diagnosis Date   Anxiety    Attention deficit disorder (ADD)    Chronic foot pain 09/17/2024   bilateral - hx chronic foot ulcers   Depression    Diabetes (HCC)    Hiatal hernia    HLD (hyperlipidemia)    no meds, diet controlled per pt on 1014/25   Movement disorder    Neuropathy of both feet 08/21/2024   in [facility]   Osteomyelitis (HCC) 09/17/2024   left calcaneus and great toe of right foot   Restless legs syndrome (RLS) 08/21/2024   in [facility]   Schatzki's ring    Sleep apnea    does not use CPAP - intolerance to mask    Surgeries: Procedure(s): AMPUTATION, FOOT, RAY EXCISION, LEFT CALCANEOUS BONE APPLICATION, WOUND VAC on 10/11/2024   Consultants:   Discharged Condition: Improved  Hospital Course: Rhonda Thomas is an 68 y.o. female who was admitted 10/11/2024 with a chief complaint of No chief complaint on file. , and found to have a diagnosis of Surgical wound breakdown.  They were brought to the operating room on 10/11/2024 and underwent the above named procedures.    They were given perioperative antibiotics:  Anti-infectives (From admission, onward)    Thomas     Dose/Rate Route Frequency Ordered Stop   10/11/24 1830  ceFAZolin  (ANCEF ) IVPB 2g/100 mL premix        2 g 200 mL/hr over 30 Minutes Intravenous Every 6 hours 10/11/24 1442 10/12/24 0019   10/11/24 1230  ceFAZolin  (ANCEF ) IVPB 2g/100 mL premix        2 g 200 mL/hr over 30 Minutes Intravenous On call to O.R. 10/11/24 1213 10/11/24 1230     .  They were given compression  stockings, early ambulation, and chemoprophylaxis for DVT prophylaxis.  They benefited maximally from their hospital stay and there were no complications.    Recent vital signs:  Vitals:   10/12/24 0000 10/12/24 0539  BP: (!) 100/55 115/63  Pulse: 74 67  Resp: 20   Temp: 98.1 F (36.7 C) (!) 97.5 F (36.4 C)  SpO2: 96% 97%    Recent laboratory studies:  Results for orders placed or performed during the hospital encounter of 10/11/24  Glucose, capillary   Collection Time: 10/11/24 10:55 AM  Result Value Ref Range   Glucose-Capillary 195 (H) 70 - 99 mg/dL  Glucose, capillary   Collection Time: 10/11/24  1:41 PM  Result Value Ref Range   Glucose-Capillary 133 (H) 70 - 99 mg/dL  Glucose, capillary   Collection Time: 10/11/24  4:10 PM  Result Value Ref Range   Glucose-Capillary 214 (H) 70 - 99 mg/dL  Hemoglobin J8r   Collection Time: 10/11/24  4:11 PM  Result Value Ref Range   Hgb A1c MFr Bld 8.5 (H) 4.8 - 5.6 %   Mean Plasma Glucose 197.25 mg/dL  CBC   Collection Time: 10/11/24  4:11 PM  Result Value Ref Range   WBC 8.4 4.0 - 10.5 K/uL   RBC 3.52 (L) 3.87 - 5.11 MIL/uL   Hemoglobin 9.4 (L) 12.0 - 15.0 g/dL  HCT 30.2 (L) 36.0 - 46.0 %   MCV 85.8 80.0 - 100.0 fL   MCH 26.7 26.0 - 34.0 pg   MCHC 31.1 30.0 - 36.0 g/dL   RDW 85.7 88.4 - 84.4 %   Platelets 345 150 - 400 K/uL   nRBC 0.0 0.0 - 0.2 %  Creatinine, serum   Collection Time: 10/11/24  4:11 PM  Result Value Ref Range   Creatinine, Ser 0.74 0.44 - 1.00 mg/dL   GFR, Estimated >39 >39 mL/min  Glucose, capillary   Collection Time: 10/11/24  9:02 PM  Result Value Ref Range   Glucose-Capillary 396 (H) 70 - 99 mg/dL  Glucose, capillary   Collection Time: 10/12/24  7:38 AM  Result Value Ref Range   Glucose-Capillary 370 (H) 70 - 99 mg/dL    Discharge Medications:   Allergies as of 10/12/2024       Reactions   Canagliflozin  Other (See Comments)   vaginitis   Pioglitazone Other (See Comments)   Edema         Medication List     TAKE these medications    Belbuca 300 MCG Film Generic drug: Buprenorphine HCl Take 1 Film by mouth every 12 (twelve) hours.   DULoxetine  60 MG capsule Commonly known as: CYMBALTA  Take 1 capsule (60 mg total) by mouth at bedtime.   FreeStyle Libre 3 Plus Sensor Misc Change sensor every 15 days. DX:E11.65   Lantus  SoloStar 100 UNIT/ML Solostar Pen Generic drug: insulin  glargine Inject 30 Units into the skin daily. What changed: how much to take   metFORMIN  500 MG 24 hr tablet Commonly known as: GLUCOPHAGE -XR Take 2 tablets (1,000 mg total) by mouth 2 (two) times daily with a meal.   Mounjaro 5 MG/0.5ML Pen Generic drug: tirzepatide Inject 5 mg into the skin once a week. DX: E11.65, mail order   NovoLOG  FlexPen 100 UNIT/ML FlexPen Generic drug: insulin  aspart Inject 3-20 Units into the skin 3 (three) times daily with meals. What changed: when to take this   pregabalin  150 MG capsule Commonly known as: LYRICA  1 in am 2 at night   rosuvastatin  5 MG tablet Commonly known as: Crestor  Take 1 tablet (5 mg total) by mouth daily.   Trulicity  4.5 MG/0.5ML Soaj Generic drug: Dulaglutide  Inject 4.5 mg into the skin every 7 (seven) days.        Diagnostic Studies: XR Foot 2 Views Right Result Date: 09/17/2024 2 view radiographs of the right foot shows destructive changes of the tuft of the residual proximal phalanx consistent with chronic osteomyelitis.  XR Foot 2 Views Left Result Date: 09/17/2024 2 view radiographs of the left calcaneus shows chronic osteomyelitis of the calcaneus with an ulcer that extends to bone.   Disposition: Discharge disposition: 01-Home or Self Care       Discharge Instructions     Call MD / Call 911   Complete by: As directed    If you experience chest pain or shortness of breath, CALL 911 and be transported to the hospital emergency room.  If you develope a fever above 101 F, pus (white drainage) or  increased drainage or redness at the wound, or calf pain, call your surgeon's office.   Constipation Prevention   Complete by: As directed    Drink plenty of fluids.  Prune juice may be helpful.  You may use a stool softener, such as Colace (over the counter) 100 mg twice a day.  Use MiraLax (over the counter) for constipation  as needed.   Diet - low sodium heart healthy   Complete by: As directed    Increase activity slowly as tolerated   Complete by: As directed    Negative Pressure Wound Therapy - Incisional   Complete by: As directed    Patient will need 2 new canisters for the Prevena plus pump for discharge.   Post-operative opioid taper instructions:   Complete by: As directed    POST-OPERATIVE OPIOID TAPER INSTRUCTIONS: It is important to wean off of your opioid medication as soon as possible. If you do not need pain medication after your surgery it is ok to stop day one. Opioids include: Codeine, Hydrocodone(Norco, Vicodin), Oxycodone (Percocet, oxycontin ) and hydromorphone amongst others.  Long term and even short term use of opiods can cause: Increased pain response Dependence Constipation Depression Respiratory depression And more.  Withdrawal symptoms can include Flu like symptoms Nausea, vomiting And more Techniques to manage these symptoms Hydrate well Eat regular healthy meals Stay active Use relaxation techniques(deep breathing, meditating, yoga) Do Not substitute Alcohol to help with tapering If you have been on opioids for less than two weeks and do not have pain than it is ok to stop all together.  Plan to wean off of opioids This plan should Thomas within one week post op of your joint replacement. Maintain the same interval or time between taking each dose and first decrease the dose.  Cut the total daily intake of opioids by one tablet each day Next Thomas to increase the time between doses. The last dose that should be eliminated is the evening dose.            Follow-up Information     Harden Jerona GAILS, MD Follow up in 1 week(s).   Specialty: Orthopedic Surgery Contact information: [address] [place] [address] [phone]                  Signed: Jerona GAILS Harden 10/14/2024, 10:05 AM

## 2024-10-14 NOTE — Anesthesia Postprocedure Evaluation (Signed)
 Anesthesia Post Note  Patient: Rhonda Thomas  Procedure(s) Performed: AMPUTATION, FOOT, RAY (Right) EXCISION, LEFT CALCANEOUS BONE (Left: Heel) APPLICATION, WOUND VAC (Left: Foot)     Patient location during evaluation: PACU Anesthesia Type: General Level of consciousness: awake Pain management: pain level controlled Vital Signs Assessment: post-procedure vital signs reviewed and stable Respiratory status: spontaneous breathing, nonlabored ventilation and respiratory function stable Cardiovascular status: blood pressure returned to baseline and stable Postop Assessment: no apparent nausea or vomiting Anesthetic complications: no   No notable events documented.  Last Vitals:  Vitals:   10/12/24 0000 10/12/24 0539  BP: (!) 100/55 115/63  Pulse: 74 67  Resp: 20   Temp: 36.7 C (!) 36.4 C  SpO2: 96% 97%    Last Pain:  Vitals:   10/12/24 0914  TempSrc:   PainSc: 1                  Yarlin Breisch P Torrence Branagan

## 2024-10-15 ENCOUNTER — Encounter: Payer: Self-pay | Admitting: *Deleted

## 2024-10-16 ENCOUNTER — Other Ambulatory Visit (HOSPITAL_COMMUNITY): Payer: Self-pay

## 2024-10-16 ENCOUNTER — Encounter: Payer: Self-pay | Admitting: Orthopedic Surgery

## 2024-10-16 NOTE — Progress Notes (Signed)
 Office Visit Note   Patient: Rhonda Thomas           Date of Birth: 06/29/1956           MRN: [id number] Visit Date: 10/14/2024              Requested by: Dettinger, Fonda LABOR, MD [address] [place],  [place] [address] PCP: Dettinger, Fonda LABOR, MD  Chief Complaint  Patient presents with   Right Foot - Routine Post Op    10/11/2024 1st ray amputation    Left Foot - Routine Post Op    10/11/2024 left calcaneal excision       HPI: Discussed the use of AI scribe software for clinical note transcription with the patient, who gave verbal consent to proceed.  History of Present Illness Rhonda Thomas is a 68 year old female who presents with issues related to her wound care and malfunctioning WoundVac device.  She is experiencing issues with her WoundVac device due to a chewed-up plug that lost its seal, causing the device to beep continuously for two nights and disrupting her sleep. She has been holding the plug to prevent leakage and has been unable to get much blood into the device as it leaks everywhere but inside.  The hospital provided her with a new device, but it was missing a canister, and she was instructed to bring it back. She has been using two [employer] boots, alternating them as they are washed, which she finds helpful.     Assessment & Plan: Visit Diagnoses: No diagnosis found.  Plan: Assessment and Plan Assessment & Plan Right first ray amputation wound, post-operative care Wound well approximated with macerated skin, no drainage, healing well. - Continue daily dressing changes with soap and water, gauze, and ace wrap. - Maintain non-weight bearing status on the right foot. - Wear a boot on the right foot.  Left heel surgical wound, post-operative care Wound well approximated, no maceration, no drainage, healing well. - Continue daily dressing changes with soap and water, gauze, and ace wrap. - Wear a boot on the left foot round the clock.       Follow-Up Instructions: No follow-ups on file.   Ortho Exam  Patient is alert, oriented, no adenopathy, well-dressed, normal affect, normal respiratory effort. Physical Exam EXTREMITIES: Wounds on both feet are macerated but well approximated. Right first ray amputation is well approximated with macerated skin. Left heel incision is well approximated with maceration, no drainage.      Imaging: No results found. No images are attached to the encounter.  Labs: Lab Results  Component Value Date   HGBA1C 8.5 (H) 10/11/2024   HGBA1C 8.5 (H) 08/22/2024   HGBA1C 7.1 (H) 05/16/2024   ESRSEDRATE 3 10/13/2020   CRP 3 10/13/2020   REPTSTATUS 09/30/2024 FINAL 09/25/2024   GRAMSTAIN NO WBC SEEN RARE GRAM NEGATIVE RODS  09/25/2024   CULT  09/25/2024    No growth aerobically or anaerobically. Performed at [facility], [address], [place], [place] [address]      Lab Results  Component Value Date   ALBUMIN 2.7 (L) 09/25/2024   ALBUMIN 3.4 (L) 08/22/2024   ALBUMIN 3.8 (L) 02/14/2024    No results found for: MG Lab Results  Component Value Date   VD25OH 17.4 (L) 10/13/2020   VD25OH 27.2 (L) 08/25/2017    No results found for: PREALBUMIN    Latest Ref Rng & Units 10/11/2024    4:11  PM 09/25/2024    9:00 AM 08/22/2024    9:21 AM  CBC EXTENDED  WBC 4.0 - 10.5 K/uL 8.4  10.8  6.2   RBC 3.87 - 5.11 MIL/uL 3.52  4.18  4.01   Hemoglobin 12.0 - 15.0 g/dL 9.4  88.6  89.0   HCT 63.9 - 46.0 % 30.2  36.5  35.5   Platelets 150 - 400 K/uL 345  325  300   NEUT# 1.7 - 7.7 K/uL  7.2  3.1   Lymph# 0.7 - 4.0 K/uL  2.2  2.2      There is no height or weight on file to calculate BMI.  Orders:  No orders of the defined types were placed in this encounter.  No orders of the defined types were placed in this encounter.    Procedures: No procedures performed  Clinical Data: No additional findings.  ROS:  All other systems negative, except as noted in the  HPI. Review of Systems  Objective: Vital Signs: There were no vitals taken for this visit.  Specialty Comments:  No specialty comments available.  PMFS History: Patient Active Problem List   Diagnosis Date Noted   Dehiscence of amputation stump of left lower extremity (HCC) 10/11/2024   Dehiscence of amputation stump of right lower extremity (HCC) 10/11/2024   Surgical wound breakdown 10/11/2024   Osteomyelitis of great toe of right foot (HCC) 09/25/2024   Acute osteomyelitis of left calcaneus (HCC) 09/25/2024   Wound, open with complication 09/25/2024   Diabetes mellitus (HCC) 10/28/2022   Nuclear sclerotic cataract of both eyes 05/05/2022   Personal history of diabetic foot ulcer 08/10/2019   Bilateral carpal tunnel syndrome 02/25/2019   Major depressive disorder, recurrent episode, moderate (HCC) 02/09/2018   Diabetic peripheral neuropathy (HCC) 11/07/2017   Vitamin B12 deficiency 11/07/2017   DDD (degenerative disc disease), lumbosacral 10/02/2017   Overweight (BMI 25.0-29.9) 10/24/2016   Somnolence, daytime 03/10/2015   Generalized anxiety disorder 06/21/2013   Attention deficit disorder 06/21/2013   Hyperlipidemia 06/21/2013   Restless legs syndrome (RLS) 06/20/2013   Mild nonproliferative diabetic retinopathy of both eyes (HCC) 06/20/2013   Past Medical History:  Diagnosis Date   Anxiety    Attention deficit disorder (ADD)    Chronic foot pain 09/17/2024   bilateral - hx chronic foot ulcers   Depression    Diabetes (HCC)    Hiatal hernia    HLD (hyperlipidemia)    no meds, diet controlled per pt on 1014/25   Movement disorder    Neuropathy of both feet 08/21/2024   in [place]   Osteomyelitis (HCC) 09/17/2024   left calcaneus and great toe of right foot   Restless legs syndrome (RLS) 08/21/2024   in [place]   Schatzki's ring    Sleep apnea    does not use CPAP - intolerance to mask    Family History  Problem Relation Age of Onset   Stroke Mother    Sleep apnea  Mother    Diabetes Father    Heart disease Father        pacemaker   Sleep apnea Father    Diabetes Other    Heart Problems Other    Colon cancer Neg Hx    Breast cancer Neg Hx     Past Surgical History:  Procedure Laterality Date   ABDOMINAL HYSTERECTOMY     AMPUTATION Right 10/11/2024   Procedure: AMPUTATION, FOOT, RAY;  Surgeon: Harden Jerona GAILS, MD;  Location: [facility] OR;  Service:  Orthopedics;  Laterality: Right;  Right 1st Ray Amputation   AMPUTATION TOE Right 09/25/2024   Procedure: AMPUTATION, TOE;  Surgeon: Harden Jerona GAILS, MD;  Location: [facility] OR;  Service: Orthopedics;  Laterality: Right;  RIGHT GREAT TOE AMPUTATION   APPLICATION OF WOUND VAC N/A 09/25/2024   Procedure: APPLICATION, WOUND VAC;  Surgeon: Harden Jerona GAILS, MD;  Location: [facility] OR;  Service: Orthopedics;  Laterality: N/A;   APPLICATION OF WOUND VAC Left 10/11/2024   Procedure: APPLICATION, WOUND VAC;  Surgeon: Harden Jerona GAILS, MD;  Location: [facility] OR;  Service: Orthopedics;  Laterality: Left;   BREAST LUMPECTOMY WITH RADIOACTIVE SEED LOCALIZATION Right 04/25/2016   Procedure: RIGHT BREAST LUMPECTOMY WITH RADIOACTIVE SEED LOCALIZATION;  Surgeon: Deward Null III, MD;  Location: [facility];  Service: General;  Laterality: Right;   CATARACT EXTRACTION, BILATERAL Bilateral    CHOLECYSTECTOMY     COLONOSCOPY  2013   FOOT SURGERY Left 05/2024   at [facility]   METATARSAL HEAD EXCISION Left 09/25/2024   Procedure: EXCISION, METATARSAL BONE, HEAD;  Surgeon: Harden Jerona GAILS, MD;  Location: [facility] OR;  Service: Orthopedics;  Laterality: Left;  LEFT PARTIAL CALCANEOUS EXCISION   METATARSAL HEAD EXCISION Left 10/11/2024   Procedure: EXCISION, LEFT CALCANEOUS BONE;  Surgeon: Harden Jerona GAILS, MD;  Location: [facility] OR;  Service: Orthopedics;  Laterality: Left;  LEFT PARTIAL CALCANEUS EXCISION   RECTOCELE REPAIR     SPINAL CORD STIMULATOR IMPLANT     not currently working per pt on 09/24/24   TOE AMPUTATION Right    2nd toe rt foot   TUBAL  LIGATION     UPPER GI ENDOSCOPY  2016   Social History   Occupational History   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

## 2024-10-21 ENCOUNTER — Ambulatory Visit (INDEPENDENT_AMBULATORY_CARE_PROVIDER_SITE_OTHER): Admitting: Orthopedic Surgery

## 2024-10-21 DIAGNOSIS — T8781 Dehiscence of amputation stump: Secondary | ICD-10-CM

## 2024-10-21 MED ORDER — FLUCONAZOLE 150 MG PO TABS: 150.0000 mg | ORAL_TABLET | ORAL | 0 refills | Status: AC

## 2024-10-21 NOTE — Progress Notes (Signed)
   This encounter was created in error - please disregard. No show

## 2024-10-22 ENCOUNTER — Encounter (INDEPENDENT_AMBULATORY_CARE_PROVIDER_SITE_OTHER): Payer: Self-pay | Admitting: Pharmacist

## 2024-10-23 ENCOUNTER — Encounter: Payer: Self-pay | Admitting: Orthopedic Surgery

## 2024-10-23 NOTE — Progress Notes (Signed)
 Office Visit Note   Patient: Rhonda Thomas           Date of Birth: [birth year]           MRN: [id number] Visit Date: 10/21/2024              Requested by: Dettinger, Fonda LABOR, MD [address] [place],  [place] [address] PCP: Dettinger, Fonda LABOR, MD  Chief Complaint  Patient presents with   Right Foot - Routine Post Op     10/11/2024 1st ray amputation     Left Foot - Routine Post Op    10/11/2024 left calcaneal excision      HPI: Discussed the use of AI scribe software for clinical note transcription with the patient, who gave verbal consent to proceed.  History of Present Illness Rhonda Thomas is a 68 year old female who presents for follow-up after right foot re-amputation and left foot debridement.  She is status post right foot first re-amputation on October 31st and left foot further debridement of the calcaneus. Her tissue Gram stain showed Gram negative rods, but cultures have been negative to date. She is not currently on antibiotics.  She is using soap and water for wound care. The wound on her heel has opened up slightly more, and she has been using Steri strips to hold it together. She is keeping weight off her feet and has not been getting up at all to ensure proper healing. She wants to avoid further surgery.  She has a prescription for Diflucan  for a yeast infection, which she attributes to previous antibiotic use. She acknowledges the need to take a probiotic daily to help with gut flora balance.     Assessment & Plan: Visit Diagnoses: No diagnosis found.  Plan: Assessment and Plan Assessment & Plan Status post right foot re-amputation with surgical wound care Status post right foot re-amputation on October 31st. Wound healing well, no infection or cellulitis, good pulse, no exposed bone. - Continue soap and water wound care. - Removed stitches from right foot. - Apply Vage to incisions and cover with gauze. - Ace wrap both feet. - Maintain  non-weight bearing status. - Will follow up next week.  Left foot calcaneal wound dehiscence Progressive dehiscence over heel, no exposed bone, no infection or cellulitis. Cultures showed Gram negative rods, negative for growth. - Apply Vage to incisions and cover with gauze. - Ace wrap both feet. - Maintain non-weight bearing status. - Will follow up next week.      Follow-Up Instructions: No follow-ups on file.   Ortho Exam  Patient is alert, oriented, no adenopathy, well-dressed, normal affect, normal respiratory effort. Physical Exam CARDIOVASCULAR: Pulses palpable bilaterally, including on feet. SKIN: No cellulitis or signs of infection. Wound dehiscence over heel, approximately 1 cm deep, with no exposed bone.      Imaging: No results found. No images are attached to the encounter.  Labs: Lab Results  Component Value Date   HGBA1C 8.5 (H) 10/11/2024   HGBA1C 8.5 (H) 08/22/2024   HGBA1C 7.1 (H) 05/16/2024   ESRSEDRATE 3 10/13/2020   CRP 3 10/13/2020   REPTSTATUS 09/30/2024 FINAL 09/25/2024   GRAMSTAIN NO WBC SEEN RARE GRAM NEGATIVE RODS  09/25/2024   CULT  09/25/2024    No growth aerobically or anaerobically. Performed at [facility], [address] [place], [place] [address]      Lab Results  Component Value Date   ALBUMIN 2.7 (L) 09/25/2024  ALBUMIN 3.4 (L) 08/22/2024   ALBUMIN 3.8 (L) 02/14/2024    No results found for: MG Lab Results  Component Value Date   VD25OH 17.4 (L) 10/13/2020   VD25OH 27.2 (L) 08/25/2017    No results found for: PREALBUMIN    Latest Ref Rng & Units 10/11/2024    4:11 PM 09/25/2024    9:00 AM 08/22/2024    9:21 AM  CBC EXTENDED  WBC 4.0 - 10.5 K/uL 8.4  10.8  6.2   RBC 3.87 - 5.11 MIL/uL 3.52  4.18  4.01   Hemoglobin 12.0 - 15.0 g/dL 9.4  88.6  89.0   HCT 63.9 - 46.0 % 30.2  36.5  35.5   Platelets 150 - 400 K/uL 345  325  300   NEUT# 1.7 - 7.7 K/uL  7.2  3.1   Lymph# 0.7 - 4.0 K/uL  2.2  2.2       There is no height or weight on file to calculate BMI.  Orders:  No orders of the defined types were placed in this encounter.  Meds ordered this encounter  Medications   fluconazole  (DIFLUCAN ) 150 MG tablet    Sig: Take 1 tablet (150 mg total) by mouth every 3 (three) days.    Dispense:  3 tablet    Refill:  0     Procedures: No procedures performed  Clinical Data: No additional findings.  ROS:  All other systems negative, except as noted in the HPI. Review of Systems  Objective: Vital Signs: There were no vitals taken for this visit.  Specialty Comments:  No specialty comments available.  PMFS History: Patient Active Problem List   Diagnosis Date Noted   Dehiscence of amputation stump of left lower extremity (HCC) 10/11/2024   Dehiscence of amputation stump of right lower extremity (HCC) 10/11/2024   Surgical wound breakdown 10/11/2024   Osteomyelitis of great toe of right foot (HCC) 09/25/2024   Acute osteomyelitis of left calcaneus (HCC) 09/25/2024   Wound, open with complication 09/25/2024   Diabetes mellitus (HCC) 10/28/2022   Nuclear sclerotic cataract of both eyes 05/05/2022   Personal history of diabetic foot ulcer 08/10/2019   Bilateral carpal tunnel syndrome 02/25/2019   Major depressive disorder, recurrent episode, moderate (HCC) 02/09/2018   Diabetic peripheral neuropathy (HCC) 11/07/2017   Vitamin B12 deficiency 11/07/2017   DDD (degenerative disc disease), lumbosacral 10/02/2017   Overweight (BMI 25.0-29.9) 10/24/2016   Somnolence, daytime 03/10/2015   Generalized anxiety disorder 06/21/2013   Attention deficit disorder 06/21/2013   Hyperlipidemia 06/21/2013   Restless legs syndrome (RLS) 06/20/2013   Mild nonproliferative diabetic retinopathy of both eyes (HCC) 06/20/2013   Past Medical History:  Diagnosis Date   Anxiety    Attention deficit disorder (ADD)    Chronic foot pain 09/17/2024   bilateral - hx chronic foot ulcers    Depression    Diabetes (HCC)    Hiatal hernia    HLD (hyperlipidemia)    no meds, diet controlled per pt on 1014/25   Movement disorder    Neuropathy of both feet 08/21/2024   in [place]   Osteomyelitis (HCC) 09/17/2024   left calcaneus and great toe of right foot   Restless legs syndrome (RLS) 08/21/2024   in [place]   Schatzki's ring    Sleep apnea    does not use CPAP - intolerance to mask    Family History  Problem Relation Age of Onset   Stroke Mother    Sleep apnea Mother  Diabetes Father    Heart disease Father        pacemaker   Sleep apnea Father    Diabetes Other    Heart Problems Other    Colon cancer Neg Hx    Breast cancer Neg Hx     Past Surgical History:  Procedure Laterality Date   ABDOMINAL HYSTERECTOMY     AMPUTATION Right 10/11/2024   Procedure: AMPUTATION, FOOT, RAY;  Surgeon: Harden Jerona GAILS, MD;  Location: [facility] OR;  Service: Orthopedics;  Laterality: Right;  Right 1st Ray Amputation   AMPUTATION TOE Right 09/25/2024   Procedure: AMPUTATION, TOE;  Surgeon: Harden Jerona GAILS, MD;  Location: [facility] OR;  Service: Orthopedics;  Laterality: Right;  RIGHT GREAT TOE AMPUTATION   APPLICATION OF WOUND VAC N/A 09/25/2024   Procedure: APPLICATION, WOUND VAC;  Surgeon: Harden Jerona GAILS, MD;  Location: [facility] OR;  Service: Orthopedics;  Laterality: N/A;   APPLICATION OF WOUND VAC Left 10/11/2024   Procedure: APPLICATION, WOUND VAC;  Surgeon: Harden Jerona GAILS, MD;  Location: [facility] OR;  Service: Orthopedics;  Laterality: Left;   BREAST LUMPECTOMY WITH RADIOACTIVE SEED LOCALIZATION Right 04/25/2016   Procedure: RIGHT BREAST LUMPECTOMY WITH RADIOACTIVE SEED LOCALIZATION;  Surgeon: Deward Null III, MD;  Location: [facility];  Service: General;  Laterality: Right;   CATARACT EXTRACTION, BILATERAL Bilateral    CHOLECYSTECTOMY     COLONOSCOPY  2013   FOOT SURGERY Left 05/2024   at [facility]   METATARSAL HEAD EXCISION Left 09/25/2024   Procedure: EXCISION, METATARSAL BONE, HEAD;  Surgeon:  Harden Jerona GAILS, MD;  Location: [facility] OR;  Service: Orthopedics;  Laterality: Left;  LEFT PARTIAL CALCANEOUS EXCISION   METATARSAL HEAD EXCISION Left 10/11/2024   Procedure: EXCISION, LEFT CALCANEOUS BONE;  Surgeon: Harden Jerona GAILS, MD;  Location: [facility] OR;  Service: Orthopedics;  Laterality: Left;  LEFT PARTIAL CALCANEUS EXCISION   RECTOCELE REPAIR     SPINAL CORD STIMULATOR IMPLANT     not currently working per pt on 09/24/24   TOE AMPUTATION Right    2nd toe rt foot   TUBAL LIGATION     UPPER GI ENDOSCOPY  2016   Social History   Occupational History   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

## 2024-10-28 ENCOUNTER — Encounter: Payer: Self-pay | Admitting: Physician Assistant

## 2024-10-28 ENCOUNTER — Ambulatory Visit: Admitting: Physician Assistant

## 2024-10-28 DIAGNOSIS — T8781 Dehiscence of amputation stump: Secondary | ICD-10-CM

## 2024-10-28 NOTE — Progress Notes (Signed)
 Office Visit Note   Patient: Rhonda Thomas           Date of Birth: [birth year]           MRN: [id number] Visit Date: 10/28/2024              Requested by: Dettinger, Fonda LABOR, MD [address] [place],  [place] [address] PCP: Dettinger, Fonda LABOR, MD  Chief Complaint  Patient presents with   Right Foot - Routine Post Op    10/11/24 Right 1st ray amputation   Left Foot - Routine Post Op    10/11/24 Left calcaneus excision      HPI: Rhonda Thomas is a 68 year old female with chronic foot wounds who presents with wound dehiscence and exposed bone in both feet. She returned to the OR on 10/11/24 for AMPUTATION, right FOOT, first RAY, EXCISION, LEFT CALCANEOUS BONE.     Assessment & Plan: Visit Diagnoses:  1. Dehiscence of amputation stump of right lower extremity (HCC)   2. Dehiscence of amputation stump of left lower extremity (HCC)     Plan: The sutures were removed from the calcaneous incision.  The wound was pack with 1 4 x 4 damped with Vashe.  Dry dressing then ace wrap.  The right foot wounds are packed with 4 x 4 Vashe dampened guaze, dry dressing with ace wrap.  Elevation is keep to decrease the edema.  If the incisions don't heal with time or she has infection she may need to consider amputation.    Follow-Up Instructions: Return in about 2 weeks (around 11/11/2024).   Ortho Exam  Patient is alert, oriented, no adenopathy, well-dressed, normal affect, normal respiratory effort. Calcaneous wound on the left has dehisced with open wound 3-4 cm deep.   Surrounding tissue is macerated without cellulitis or active drainage.    The medial incision on the right has proximal incisional separation with SS drainage 0.5 cm deep to probe with Q tip.  The distal incisional separation in 1 cm deep to probe.  No exposed bone.  Palpable DP B LE.         Imaging: No results found. No images are attached to the encounter.  Labs: Lab Results  Component Value Date    HGBA1C 8.5 (H) 10/11/2024   HGBA1C 8.5 (H) 08/22/2024   HGBA1C 7.1 (H) 05/16/2024   ESRSEDRATE 3 10/13/2020   CRP 3 10/13/2020   REPTSTATUS 09/30/2024 FINAL 09/25/2024   GRAMSTAIN NO WBC SEEN RARE GRAM NEGATIVE RODS  09/25/2024   CULT  09/25/2024    No growth aerobically or anaerobically. Performed at [facility], [address], [place], [place] [address]      Lab Results  Component Value Date   ALBUMIN 2.7 (L) 09/25/2024   ALBUMIN 3.4 (L) 08/22/2024   ALBUMIN 3.8 (L) 02/14/2024    No results found for: MG Lab Results  Component Value Date   VD25OH 17.4 (L) 10/13/2020   VD25OH 27.2 (L) 08/25/2017    No results found for: PREALBUMIN    Latest Ref Rng & Units 10/11/2024    4:11 PM 09/25/2024    9:00 AM 08/22/2024    9:21 AM  CBC EXTENDED  WBC 4.0 - 10.5 K/uL 8.4  10.8  6.2   RBC 3.87 - 5.11 MIL/uL 3.52  4.18  4.01   Hemoglobin 12.0 - 15.0 g/dL 9.4  88.6  89.0   HCT 63.9 - 46.0 % 30.2  36.5  35.5   Platelets 150 - 400 K/uL 345  325  300   NEUT# 1.7 - 7.7 K/uL  7.2  3.1   Lymph# 0.7 - 4.0 K/uL  2.2  2.2      There is no height or weight on file to calculate BMI.  Orders:  No orders of the defined types were placed in this encounter.  No orders of the defined types were placed in this encounter.    Procedures: No procedures performed  Clinical Data: No additional findings.  ROS:  All other systems negative, except as noted in the HPI. Review of Systems  Objective: Vital Signs: There were no vitals taken for this visit.  Specialty Comments:  No specialty comments available.  PMFS History: Patient Active Problem List   Diagnosis Date Noted   Dehiscence of amputation stump of left lower extremity (HCC) 10/11/2024   Dehiscence of amputation stump of right lower extremity (HCC) 10/11/2024   Surgical wound breakdown 10/11/2024   Osteomyelitis of great toe of right foot (HCC) 09/25/2024   Acute osteomyelitis of left calcaneus (HCC)  09/25/2024   Wound, open with complication 09/25/2024   Diabetes mellitus (HCC) 10/28/2022   Nuclear sclerotic cataract of both eyes 05/05/2022   Personal history of diabetic foot ulcer 08/10/2019   Bilateral carpal tunnel syndrome 02/25/2019   Major depressive disorder, recurrent episode, moderate (HCC) 02/09/2018   Diabetic peripheral neuropathy (HCC) 11/07/2017   Vitamin B12 deficiency 11/07/2017   DDD (degenerative disc disease), lumbosacral 10/02/2017   Overweight (BMI 25.0-29.9) 10/24/2016   Somnolence, daytime 03/10/2015   Generalized anxiety disorder 06/21/2013   Attention deficit disorder 06/21/2013   Hyperlipidemia 06/21/2013   Restless legs syndrome (RLS) 06/20/2013   Mild nonproliferative diabetic retinopathy of both eyes (HCC) 06/20/2013   Past Medical History:  Diagnosis Date   Anxiety    Attention deficit disorder (ADD)    Chronic foot pain 09/17/2024   bilateral - hx chronic foot ulcers   Depression    Diabetes (HCC)    Hiatal hernia    HLD (hyperlipidemia)    no meds, diet controlled per pt on 1014/25   Movement disorder    Neuropathy of both feet 08/21/2024   in [place]   Osteomyelitis (HCC) 09/17/2024   left calcaneus and great toe of right foot   Restless legs syndrome (RLS) 08/21/2024   in [place]   Schatzki's ring    Sleep apnea    does not use CPAP - intolerance to mask    Family History  Problem Relation Age of Onset   Stroke Mother    Sleep apnea Mother    Diabetes Father    Heart disease Father        pacemaker   Sleep apnea Father    Diabetes Other    Heart Problems Other    Colon cancer Neg Hx    Breast cancer Neg Hx     Past Surgical History:  Procedure Laterality Date   ABDOMINAL HYSTERECTOMY     AMPUTATION Right 10/11/2024   Procedure: AMPUTATION, FOOT, RAY;  Surgeon: Harden Jerona GAILS, MD;  Location: [facility] OR;  Service: Orthopedics;  Laterality: Right;  Right 1st Ray Amputation   AMPUTATION TOE Right 09/25/2024   Procedure: AMPUTATION, TOE;   Surgeon: Harden Jerona GAILS, MD;  Location: [facility] OR;  Service: Orthopedics;  Laterality: Right;  RIGHT GREAT TOE AMPUTATION   APPLICATION OF WOUND VAC N/A 09/25/2024   Procedure: APPLICATION, WOUND VAC;  Surgeon: Harden Jerona GAILS, MD;  Location: [facility] OR;  Service: Orthopedics;  Laterality: N/A;   APPLICATION OF WOUND VAC Left 10/11/2024   Procedure: APPLICATION, WOUND VAC;  Surgeon: Harden Jerona GAILS, MD;  Location: [facility] OR;  Service: Orthopedics;  Laterality: Left;   BREAST LUMPECTOMY WITH RADIOACTIVE SEED LOCALIZATION Right 04/25/2016   Procedure: RIGHT BREAST LUMPECTOMY WITH RADIOACTIVE SEED LOCALIZATION;  Surgeon: Deward Null III, MD;  Location: [facility];  Service: General;  Laterality: Right;   CATARACT EXTRACTION, BILATERAL Bilateral    CHOLECYSTECTOMY     COLONOSCOPY  2013   FOOT SURGERY Left 05/2024   at [facility]   METATARSAL HEAD EXCISION Left 09/25/2024   Procedure: EXCISION, METATARSAL BONE, HEAD;  Surgeon: Harden Jerona GAILS, MD;  Location: [facility] OR;  Service: Orthopedics;  Laterality: Left;  LEFT PARTIAL CALCANEOUS EXCISION   METATARSAL HEAD EXCISION Left 10/11/2024   Procedure: EXCISION, LEFT CALCANEOUS BONE;  Surgeon: Harden Jerona GAILS, MD;  Location[facility] OR;  Service: Orthopedics;  Laterality: Left;  LEFT PARTIAL CALCANEUS EXCISION   RECTOCELE REPAIR     SPINAL CORD STIMULATOR IMPLANT     not currently working per pt on 09/24/24   TOE AMPUTATION Right    2nd toe rt foot   TUBAL LIGATION     UPPER GI ENDOSCOPY  2016   Social History   Occupational History   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

## 2024-11-01 ENCOUNTER — Ambulatory Visit: Payer: BC Managed Care – PPO

## 2024-11-01 ENCOUNTER — Telehealth: Payer: Self-pay

## 2024-11-01 VITALS — BP 117/74 | HR 82 | Ht 69.0 in | Wt 194.0 lb

## 2024-11-01 DIAGNOSIS — Z0000 Encounter for general adult medical examination without abnormal findings: Secondary | ICD-10-CM | POA: Diagnosis not present

## 2024-11-01 NOTE — Telephone Encounter (Signed)
-----   Message from Fonda LABOR Dettinger sent at 11/01/2024  1:30 PM EST ----- Yes have her hold it for now and we will discuss it further at her next visit will watch her blood sugars closely, if they start to go more elevated then let me know. ----- Message ----- From: Debby Simper, CMA Sent: 11/01/2024  12:33 PM EST To: Fonda LABOR Dettinger, MD  During today's awv, pt stated that her mounjaro  is making her really sick, hard on her stop stomach. I did suggest for pt to not take if it's making her sick. Please advise?

## 2024-11-01 NOTE — Progress Notes (Signed)
 Chief Complaint  Patient presents with   Medicare Wellness     Subjective:   Rhonda Thomas is a 68 y.o. female who presents for a Medicare Annual Wellness Visit.  Allergies (verified) Canagliflozin  and Pioglitazone   History: Past Medical History:  Diagnosis Date   Anxiety    Attention deficit disorder (ADD)    Chronic foot pain 09/17/2024   bilateral - hx chronic foot ulcers   Depression    Diabetes (HCC)    Hiatal hernia    HLD (hyperlipidemia)    no meds, diet controlled per pt on 1014/25   Movement disorder    Neuropathy of both feet 08/21/2024   in [place]   Osteomyelitis (HCC) 09/17/2024   left calcaneus and great toe of right foot   Restless legs syndrome (RLS) 08/21/2024   in [place]   Schatzki's ring    Sleep apnea    does not use CPAP - intolerance to mask   Past Surgical History:  Procedure Laterality Date   ABDOMINAL HYSTERECTOMY     AMPUTATION Right 10/11/2024   Procedure: AMPUTATION, FOOT, RAY;  Surgeon: Harden Jerona GAILS, MD;  Location: [facility] OR;  Service: Orthopedics;  Laterality: Right;  Right 1st Ray Amputation   AMPUTATION TOE Right 09/25/2024   Procedure: AMPUTATION, TOE;  Surgeon: Harden Jerona GAILS, MD;  Location: [facility] OR;  Service: Orthopedics;  Laterality: Right;  RIGHT GREAT TOE AMPUTATION   APPLICATION OF WOUND VAC N/A 09/25/2024   Procedure: APPLICATION, WOUND VAC;  Surgeon: Harden Jerona GAILS, MD;  Location: [facility] OR;  Service: Orthopedics;  Laterality: N/A;   APPLICATION OF WOUND VAC Left 10/11/2024   Procedure: APPLICATION, WOUND VAC;  Surgeon: Harden Jerona GAILS, MD;  Location: [facility] OR;  Service: Orthopedics;  Laterality: Left;   BREAST LUMPECTOMY WITH RADIOACTIVE SEED LOCALIZATION Right 04/25/2016   Procedure: RIGHT BREAST LUMPECTOMY WITH RADIOACTIVE SEED LOCALIZATION;  Surgeon: Deward Null III, MD;  Location: [facility];  Service: General;  Laterality: Right;   CATARACT EXTRACTION, BILATERAL Bilateral    CHOLECYSTECTOMY     COLONOSCOPY  2013    FOOT SURGERY Left 05/2024   at [facility]   METATARSAL HEAD EXCISION Left 09/25/2024   Procedure: EXCISION, METATARSAL BONE, HEAD;  Surgeon: Harden Jerona GAILS, MD;  Location[facility] OR;  Service: Orthopedics;  Laterality: Left;  LEFT PARTIAL CALCANEOUS EXCISION   METATARSAL HEAD EXCISION Left 10/11/2024   Procedure: EXCISION, LEFT CALCANEOUS BONE;  Surgeon: Harden Jerona GAILS, MD;  Location: [facility] OR;  Service: Orthopedics;  Laterality: Left;  LEFT PARTIAL CALCANEUS EXCISION   RECTOCELE REPAIR     SPINAL CORD STIMULATOR IMPLANT     not currently working per pt on 09/24/24   TOE AMPUTATION Right    2nd toe rt foot   TUBAL LIGATION     UPPER GI ENDOSCOPY  2016   Family History  Problem Relation Age of Onset   Stroke Mother    Sleep apnea Mother    Diabetes Father    Heart disease Father        pacemaker   Sleep apnea Father    Diabetes Other    Heart Problems Other    Colon cancer Neg Hx    Breast cancer Neg Hx    Social History   Occupational History   Occupation: Retired    Associate Professor: [employer]    Comment: [employer]  Tobacco Use   Smoking status: Never   Smokeless tobacco: Never  Advertising Account Planner  Vaping status: Never Used  Substance and Sexual Activity   Alcohol use: No    Alcohol/week: 0.0 standard drinks of alcohol   Drug use: Never   Sexual activity: Yes    Birth control/protection: Surgical    Comment: Hysterectomy   Tobacco Counseling Counseling given: Yes  SDOH Screenings   Food Insecurity: No Food Insecurity (11/01/2024)  Housing: Unknown (11/01/2024)  Transportation Needs: No Transportation Needs (11/01/2024)  Utilities: Not At Risk (11/01/2024)  Alcohol Screen: Low Risk  (11/01/2023)  Depression (PHQ2-9): Medium Risk (11/01/2024)  Financial Resource Strain: Low Risk  (04/17/2024)   Received from [facility]  Physical Activity: Inactive (11/01/2024)  Social Connections: Moderately Integrated (11/01/2024)  Stress: Stress Concern Present  (11/01/2024)  Tobacco Use: Low Risk  (11/01/2024)  Health Literacy: Adequate Health Literacy (11/01/2024)   See flowsheets for full screening details  Depression Screen PHQ 2 & 9 Depression Scale- Over the past 2 weeks, how often have you been bothered by any of the following problems? Little interest or pleasure in doing things: 0 Feeling down, depressed, or hopeless (PHQ Adolescent also includes...irritable): 3 PHQ-2 Total Score: 3 Trouble falling or staying asleep, or sleeping too much: 3 Feeling tired or having little energy: 0 Poor appetite or overeating (PHQ Adolescent also includes...weight loss): 0 Feeling bad about yourself - or that you are a failure or have let yourself or your family down: 1 Trouble concentrating on things, such as reading the newspaper or watching television (PHQ Adolescent also includes...like school work): 1 Moving or speaking so slowly that other people could have noticed. Or the opposite - being so fidgety or restless that you have been moving around a lot more than usual: 0 Thoughts that you would be better off dead, or of hurting yourself in some way: 0 PHQ-9 Total Score: 8 If you checked off any problems, how difficult have these problems made it for you to do your work, take care of things at home, or get along with other people?: Not difficult at all  Depression Treatment Depression Interventions/Treatment : Currently on Treatment     Goals Addressed             This Visit's Progress    Patient Stated   On track    I'd like to retire       Visit info / Clinical Intake: Medicare Wellness Visit Type:: Subsequent Annual Wellness Visit Persons participating in visit:: patient Medicare Wellness Visit Mode:: Telephone If telephone:: video declined Because this visit was a virtual/telehealth visit:: vitals recorded from last visit If Telephone or Video please confirm:: I connected with the patient using audio enabled telemedicine application  and verified that I am speaking with the correct person using two identifiers Patient Location:: home Provider Location:: home office Information given by:: patient Interpreter Needed?: No Pre-visit prep was completed: yes AWV questionnaire completed by patient prior to visit?: no Living arrangements:: lives with spouse/significant other Patient's Overall Health Status Rating: good Typical amount of pain: none Does pain affect daily life?: no Are you currently prescribed opioids?: no  Dietary Habits and Nutritional Risks How many meals a day?: 2 Eats fruit and vegetables daily?: yes Most meals are obtained by: preparing own meals In the last 2 weeks, have you had any of the following?: (!) nausea, vomiting, diarrhea Diabetic:: (!) yes Any non-healing wounds?: (!) yes (L-heel, wound is not heeling as well as she expected since foot surgery 3-wks ago/pt is keep an eye on it) How often do you  check your BS?: 4; continuous glucose monitor Would you like to be referred to a Nutritionist or for Diabetic Management? : no  Functional Status Activities of Daily Living (to include ambulation/medication): (!) Dependent (pt is bed bound due to foot surgery for now) Feeding: Dependent (pt's husband is helping pt) Dressing/Grooming: Dependent Bathing: Dependent Toileting: Dependent Transfer: Dependent Ambulation: Dependent Medication Administration: Dependent Home Management: Dependent Manage your own finances?: yes Primary transportation is: family/friends Concerns about hearing?: no  Fall Screening Falls in the past year?: 1 Number of falls in past year: 1 Was there an injury with Fall?: 1 Fall Risk Category Calculator: 3 Patient Fall Risk Level: High Fall Risk  Fall Risk Patient at Risk for Falls Due to: Impaired balance/gait; Impaired mobility Fall risk Follow up: Falls evaluation completed; Education provided  Home and Transportation Safety: All rugs have non-skid backing?:  yes All stairs or steps have railings?: (!) no Grab bars in the bathtub or shower?: (!) no Have non-skid surface in bathtub or shower?: yes Good home lighting?: yes Regular seat belt use?: yes Hospital stays in the last year:: (!) yes How many hospital stays:: 3 Reason: foot surgery  Cognitive Assessment Difficulty concentrating, remembering, or making decisions? : yes Will 6CIT or Mini Cog be Completed: yes What year is it?: 0 points What month is it?: 0 points Give patient an address phrase to remember (5 components): [address] [place] About what time is it?: 0 points Count backwards from 20 to 1: 0 points Say the months of the year in reverse: 0 points Repeat the address phrase from earlier: 0 points 6 CIT Score: 0 points  Advance Directives (For Healthcare) Does Patient Have a Medical Advance Directive?: No Would patient like information on creating a medical advance directive?: No - Patient declined  Reviewed/Updated  Reviewed/Updated: Reviewed All (Medical, Surgical, Family, Medications, Allergies, Care Teams, Patient Goals); Medical History; Surgical History; Family History; Medications; Allergies; Care Teams; Patient Goals        Objective:    There were no vitals filed for this visit. There is no height or weight on file to calculate BMI.  Current Medications (verified) Outpatient Encounter Medications as of 11/01/2024  Medication Sig   BELBUCA  300 MCG FILM Take 1 Film by mouth every 12 (twelve) hours.   Continuous Glucose Sensor (FREESTYLE LIBRE 3 PLUS SENSOR) MISC Change sensor every 15 days. DX:E11.65   DULoxetine  (CYMBALTA ) 60 MG capsule Take 1 capsule (60 mg total) by mouth at bedtime.   fluconazole  (DIFLUCAN ) 150 MG tablet Take 1 tablet (150 mg total) by mouth every 3 (three) days.   insulin  aspart (NOVOLOG  FLEXPEN) 100 UNIT/ML FlexPen Inject 3-20 Units into the skin 3 (three) times daily with meals. (Patient taking differently: Inject 3-20 Units into  the skin 2 (two) times daily.)   insulin  glargine (LANTUS  SOLOSTAR) 100 UNIT/ML Solostar Pen Inject 30 Units into the skin daily. (Patient taking differently: Inject 28 Units into the skin daily.)   metFORMIN  (GLUCOPHAGE -XR) 500 MG 24 hr tablet Take 2 tablets (1,000 mg total) by mouth 2 (two) times daily with a meal.   pregabalin  (LYRICA ) 150 MG capsule 1 in am 2 at night   rosuvastatin  (CRESTOR ) 5 MG tablet Take 1 tablet (5 mg total) by mouth daily.   tirzepatide  (MOUNJARO ) 5 MG/0.5ML Pen Inject 5 mg into the skin once a week. DX: E11.65, mail order   TRULICITY  4.5 MG/0.5ML SOAJ Inject 4.5 mg into the skin every 7 (seven) days. (Patient  not taking: Reported on 11/01/2024)   No facility-administered encounter medications on file as of 11/01/2024.   Hearing/Vision screen Hearing Screening - Comments:: Pt denies hearing dif Vision Screening - Comments:: Pt wear glasses/Dr. Elner, last ov over a yr Immunizations and Health Maintenance Health Maintenance  Topic Date Due   COVID-19 Vaccine (1) Never done   OPHTHALMOLOGY EXAM  05/17/2024   Mammogram  11/20/2024   Colonoscopy  02/13/2025 (Originally 09/10/2022)   Bone Density Scan  05/16/2025 (Originally 02/03/2024)   Diabetic kidney evaluation - Urine ACR  02/13/2025   HEMOGLOBIN A1C  04/10/2025   FOOT EXAM  08/22/2025   Diabetic kidney evaluation - eGFR measurement  10/11/2025   Medicare Annual Wellness (AWV)  11/01/2025   DTaP/Tdap/Td (2 - Td or Tdap) 06/01/2027   Pneumococcal Vaccine: 50+ Years  Completed   Influenza Vaccine  Completed   Hepatitis C Screening  Completed   Zoster Vaccines- Shingrix   Completed   Meningococcal B Vaccine  Aged Out        Assessment/Plan:  This is a routine wellness examination for Rhonda Thomas.  Patient Care Team: Dettinger, Fonda LABOR, MD as PCP - General (Family Medicine) Patti Charlie CROME., MD as Physician Assistant (Pain Medicine) Billee Mliss BIRCH, [facility] as Pharmacist (Family Medicine)  I have personally  reviewed and noted the following in the patient's chart:   Medical and social history Use of alcohol, tobacco or illicit drugs  Current medications and supplements including opioid prescriptions. Functional ability and status Nutritional status Physical activity Advanced directives List of other physicians Hospitalizations, surgeries, and ER visits in previous 12 months Vitals Screenings to include cognitive, depression, and falls Referrals and appointments  No orders of the defined types were placed in this encounter.  In addition, I have reviewed and discussed with patient certain preventive protocols, quality metrics, and best practice recommendations. A written personalized care plan for preventive services as well as general preventive health recommendations were provided to patient.   Rhonda Thomas, CMA   11/01/2024   Return in 1 year (on 11/01/2025).  After Visit Summary: (MyChart) Due to this being a telephonic visit, the after visit summary with patients personalized plan was offered to patient via MyChart   Nurse Notes: pt is aware and due the following: mammo, diabetic eye and covid.

## 2024-11-01 NOTE — Telephone Encounter (Signed)
 Called pt and give pcp's msg to re: mounjaro . Pt voiced understanding and agreed to pcp's recommendations.

## 2024-11-14 ENCOUNTER — Encounter: Payer: Self-pay | Admitting: Physician Assistant

## 2024-11-14 ENCOUNTER — Ambulatory Visit: Admitting: Physician Assistant

## 2024-11-14 DIAGNOSIS — T8781 Dehiscence of amputation stump: Secondary | ICD-10-CM

## 2024-11-14 DIAGNOSIS — T8131XS Disruption of external operation (surgical) wound, not elsewhere classified, sequela: Secondary | ICD-10-CM

## 2024-11-14 NOTE — Progress Notes (Signed)
 Office Visit Note   Patient: Rhonda Thomas           Date of Birth: May 03, 1956           MRN: [id number] Visit Date: 11/14/2024              Requested by: Dettinger, Fonda LABOR, MD [address] [place],  [place] [address] PCP: Dettinger, Fonda LABOR, MD  Chief Complaint  Patient presents with   Right Foot - Routine Post Op    10/11/24 Right 1st ray amputation   Left Foot - Routine Post Op    Left cal excision      HPI: Rhonda Thomas is a 68 year old female with chronic foot wounds who presents with wound dehiscence and exposed bone in both feet. She returned to the OR on 10/11/24 for AMPUTATION, right FOOT, first RAY, EXCISION, LEFT CALCANEOUS BONE.   She has been dealing with the chronic wound problems for almost a year at this point.  He husband is taking very good care of her feet doing dressing changes daily.  She does walk on the left heel at time she states.  She knows her options are to keep doing the wound care or transtibial amputation.  She has a psychiatrist she talks to regularly and they are discussing her options.     Assessment & Plan: Visit Diagnoses:  1. Dehiscence of amputation stump of right lower extremity (HCC)   2. Disruption of external surgical wound, sequela     Plan: The wound was pack with 1 4 x 4 damped with Vashe. Dry dressing then ace wrap. The right foot wounds are packed with 4 x 4 Vashe dampened guaze, dry dressing with ace wrap. Elevation is keep to decrease the edema. If the incisions don't heal with time or she has infection she may need to consider amputation.   Follow-Up Instructions: Return in about 2 weeks (around 11/28/2024).   Ortho Exam  Patient is alert, oriented, no adenopathy, well-dressed, normal affect, normal respiratory effort. Wound beds are clean with good granulation tissue.  No cellulitis in the surrounding skin.  The left heel wound is edematous and weeping clear fluid.   There is a heel wound fissure that measures 1  cm deep.  The rest of the wound measures 8 cm x 4 cm.  The right medial foot incision has healed proximally and the distal incision wound is flat with 100 % granulation tissue. The wounds were cleaned with 4 x 4 and Vashe then packing with wet to dry guaze and Vashe.  Acre wrap for mild compression.       Imaging: No results found. No images are attached to the encounter.  Labs: Lab Results  Component Value Date   HGBA1C 8.5 (H) 10/11/2024   HGBA1C 8.5 (H) 08/22/2024   HGBA1C 7.1 (H) 05/16/2024   ESRSEDRATE 3 10/13/2020   CRP 3 10/13/2020   REPTSTATUS 09/30/2024 FINAL 09/25/2024   GRAMSTAIN NO WBC SEEN RARE GRAM NEGATIVE RODS  09/25/2024   CULT  09/25/2024    No growth aerobically or anaerobically. Performed at [facility], [address], [place], [place] [address]      Lab Results  Component Value Date   ALBUMIN 2.7 (L) 09/25/2024   ALBUMIN 3.4 (L) 08/22/2024   ALBUMIN 3.8 (L) 02/14/2024    No results found for: MG Lab Results  Component Value Date   VD25OH 17.4 (L) 10/13/2020   VD25OH 27.2 (L)  08/25/2017    No results found for: PREALBUMIN    Latest Ref Rng & Units 10/11/2024    4:11 PM 09/25/2024    9:00 AM 08/22/2024    9:21 AM  CBC EXTENDED  WBC 4.0 - 10.5 K/uL 8.4  10.8  6.2   RBC 3.87 - 5.11 MIL/uL 3.52  4.18  4.01   Hemoglobin 12.0 - 15.0 g/dL 9.4  88.6  89.0   HCT 63.9 - 46.0 % 30.2  36.5  35.5   Platelets 150 - 400 K/uL 345  325  300   NEUT# 1.7 - 7.7 K/uL  7.2  3.1   Lymph# 0.7 - 4.0 K/uL  2.2  2.2      There is no height or weight on file to calculate BMI.  Orders:  No orders of the defined types were placed in this encounter.  No orders of the defined types were placed in this encounter.    Procedures: No procedures performed  Clinical Data: No additional findings.  ROS:  All other systems negative, except as noted in the HPI. Review of Systems  Objective: Vital Signs: There were no vitals taken for this  visit.  Specialty Comments:  No specialty comments available.  PMFS History: Patient Active Problem List   Diagnosis Date Noted   Dehiscence of amputation stump of left lower extremity (HCC) 10/11/2024   Dehiscence of amputation stump of right lower extremity (HCC) 10/11/2024   Surgical wound breakdown 10/11/2024   Osteomyelitis of great toe of right foot (HCC) 09/25/2024   Acute osteomyelitis of left calcaneus (HCC) 09/25/2024   Wound, open with complication 09/25/2024   Diabetes mellitus (HCC) 10/28/2022   Nuclear sclerotic cataract of both eyes 05/05/2022   Personal history of diabetic foot ulcer 08/10/2019   Bilateral carpal tunnel syndrome 02/25/2019   Major depressive disorder, recurrent episode, moderate (HCC) 02/09/2018   Diabetic peripheral neuropathy (HCC) 11/07/2017   Vitamin B12 deficiency 11/07/2017   DDD (degenerative disc disease), lumbosacral 10/02/2017   Overweight (BMI 25.0-29.9) 10/24/2016   Somnolence, daytime 03/10/2015   Generalized anxiety disorder 06/21/2013   Attention deficit disorder 06/21/2013   Hyperlipidemia 06/21/2013   Restless legs syndrome (RLS) 06/20/2013   Mild nonproliferative diabetic retinopathy of both eyes (HCC) 06/20/2013   Past Medical History:  Diagnosis Date   Anxiety    Attention deficit disorder (ADD)    Chronic foot pain 09/17/2024   bilateral - hx chronic foot ulcers   Depression    Diabetes (HCC)    Hiatal hernia    HLD (hyperlipidemia)    no meds, diet controlled per pt on 1014/25   Movement disorder    Neuropathy of both feet 08/21/2024   in [facility]   Osteomyelitis (HCC) 09/17/2024   left calcaneus and great toe of right foot   Restless legs syndrome (RLS) 08/21/2024   in [facility]   Schatzki's ring    Sleep apnea    does not use CPAP - intolerance to mask    Family History  Problem Relation Age of Onset   Stroke Mother    Sleep apnea Mother    Diabetes Father    Heart disease Father        pacemaker   Sleep apnea  Father    Diabetes Other    Heart Problems Other    Colon cancer Neg Hx    Breast cancer Neg Hx     Past Surgical History:  Procedure Laterality Date   ABDOMINAL HYSTERECTOMY  AMPUTATION Right 10/11/2024   Procedure: AMPUTATION, FOOT, RAY;  Surgeon: Harden Jerona GAILS, MD;  Location: [facility] OR;  Service: Orthopedics;  Laterality: Right;  Right 1st Ray Amputation   AMPUTATION TOE Right 09/25/2024   Procedure: AMPUTATION, TOE;  Surgeon: Harden Jerona GAILS, MD;  Location: [facility] OR;  Service: Orthopedics;  Laterality: Right;  RIGHT GREAT TOE AMPUTATION   APPLICATION OF WOUND VAC N/A 09/25/2024   Procedure: APPLICATION, WOUND VAC;  Surgeon: Harden Jerona GAILS, MD;  Location: [facility] OR;  Service: Orthopedics;  Laterality: N/A;   APPLICATION OF WOUND VAC Left 10/11/2024   Procedure: APPLICATION, WOUND VAC;  Surgeon: Harden Jerona GAILS, MD;  Location: [facility] OR;  Service: Orthopedics;  Laterality: Left;   BREAST LUMPECTOMY WITH RADIOACTIVE SEED LOCALIZATION Right 04/25/2016   Procedure: RIGHT BREAST LUMPECTOMY WITH RADIOACTIVE SEED LOCALIZATION;  Surgeon: Deward Null III, MD;  Location: [facility];  Service: General;  Laterality: Right;   CATARACT EXTRACTION, BILATERAL Bilateral    CHOLECYSTECTOMY     COLONOSCOPY  2013   FOOT SURGERY Left 05/2024   at [facility]   METATARSAL HEAD EXCISION Left 09/25/2024   Procedure: EXCISION, METATARSAL BONE, HEAD;  Surgeon: Harden Jerona GAILS, MD;  Location: [facility] OR;  Service: Orthopedics;  Laterality: Left;  LEFT PARTIAL CALCANEOUS EXCISION   METATARSAL HEAD EXCISION Left 10/11/2024   Procedure: EXCISION, LEFT CALCANEOUS BONE;  Surgeon: Harden Jerona GAILS, MD;  Location: [facility] OR;  Service: Orthopedics;  Laterality: Left;  LEFT PARTIAL CALCANEUS EXCISION   RECTOCELE REPAIR     SPINAL CORD STIMULATOR IMPLANT     not currently working per pt on 09/24/24   TOE AMPUTATION Right    2nd toe rt foot   TUBAL LIGATION     UPPER GI ENDOSCOPY  2016   Social History   Occupational History    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

## 2024-11-18 ENCOUNTER — Other Ambulatory Visit: Payer: Self-pay | Admitting: *Deleted

## 2024-11-18 DIAGNOSIS — E1169 Type 2 diabetes mellitus with other specified complication: Secondary | ICD-10-CM

## 2024-11-20 ENCOUNTER — Encounter: Attending: Psychology | Admitting: Psychology

## 2024-11-20 ENCOUNTER — Encounter: Payer: Self-pay | Admitting: Psychology

## 2024-11-20 DIAGNOSIS — F331 Major depressive disorder, recurrent, moderate: Secondary | ICD-10-CM | POA: Insufficient documentation

## 2024-11-20 DIAGNOSIS — F5101 Primary insomnia: Secondary | ICD-10-CM | POA: Insufficient documentation

## 2024-11-20 DIAGNOSIS — F411 Generalized anxiety disorder: Secondary | ICD-10-CM

## 2024-11-20 DIAGNOSIS — E1142 Type 2 diabetes mellitus with diabetic polyneuropathy: Secondary | ICD-10-CM | POA: Diagnosis not present

## 2024-11-20 DIAGNOSIS — Z794 Long term (current) use of insulin: Secondary | ICD-10-CM | POA: Diagnosis not present
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
## 2024-11-21 ENCOUNTER — Encounter: Payer: Self-pay | Admitting: Family Medicine

## 2024-11-21 ENCOUNTER — Ambulatory Visit: Admitting: Family Medicine

## 2024-11-21 ENCOUNTER — Other Ambulatory Visit (HOSPITAL_BASED_OUTPATIENT_CLINIC_OR_DEPARTMENT_OTHER): Payer: Self-pay

## 2024-11-21 VITALS — BP 109/66 | HR 76 | Temp 98.1°F | Ht 69.0 in | Wt 183.0 lb

## 2024-11-21 DIAGNOSIS — E785 Hyperlipidemia, unspecified: Secondary | ICD-10-CM | POA: Diagnosis not present

## 2024-11-21 DIAGNOSIS — E1142 Type 2 diabetes mellitus with diabetic polyneuropathy: Secondary | ICD-10-CM

## 2024-11-21 DIAGNOSIS — F331 Major depressive disorder, recurrent, moderate: Secondary | ICD-10-CM | POA: Diagnosis not present

## 2024-11-21 DIAGNOSIS — L97521 Non-pressure chronic ulcer of other part of left foot limited to breakdown of skin: Secondary | ICD-10-CM

## 2024-11-21 DIAGNOSIS — E1169 Type 2 diabetes mellitus with other specified complication: Secondary | ICD-10-CM | POA: Diagnosis not present

## 2024-11-21 DIAGNOSIS — Z794 Long term (current) use of insulin: Secondary | ICD-10-CM | POA: Diagnosis not present

## 2024-11-21 DIAGNOSIS — E113293 Type 2 diabetes mellitus with mild nonproliferative diabetic retinopathy without macular edema, bilateral: Secondary | ICD-10-CM | POA: Diagnosis not present

## 2024-11-21 LAB — BAYER DCA HB A1C WAIVED: HB A1C (BAYER DCA - WAIVED): 7.9 % — ABNORMAL HIGH (ref 4.8–5.6)

## 2024-11-21 MED ORDER — DULOXETINE HCL 60 MG PO CPEP
60.0000 mg | ORAL_CAPSULE | Freq: Every day | ORAL | 3 refills | Status: AC
  Filled 2024-11-21: qty 90, 90d supply, fill #0

## 2024-11-21 NOTE — Progress Notes (Signed)
 BP 109/66   Pulse 76   Temp 98.1 F (36.7 C)   Ht 5' 9 (1.753 m)   Wt 183 lb (83 kg)   SpO2 95%   BMI 27.02 kg/m    Subjective:   Patient ID: Rhonda Thomas, female    DOB: 10/17/1956, 68 y.o.   MRN: [id number]  HPI: Rhonda Thomas is a 68 y.o. female presenting on 11/21/2024 for Medical Management of Chronic Issues and Diabetes   Discussed the use of AI scribe software for clinical note transcription with the patient, who gave verbal consent to proceed.  History of Present Illness   Rhonda Thomas is a 68 year old female with diabetes who presents for a recheck of her foot wounds.  Diabetic foot ulcers and surgical history - Two prior foot surgeries, most recent for infected sore leading to partial amputation. - Initial sore appeared as a 'little round sore' with burning sensation on the bottom of the foot. - Current wound on contralateral heel is 'completely down to the bone' and 'split right in half'.  Glycemic control - Blood glucose fluctuates between 200 and 300 mg/dL over the past week. - Blood sugar decreases overnight but rises during the day after meals. - Several hypoglycemic episodes in the past 30 days, particularly while taking Mounjaro . - Discontinued Mounjaro  due to gastrointestinal side effects. - Current regimen: Lantus  30 units daily, Novolog  4 units at supper with dose adjustments based on blood sugar. - Inconsistent use of Novolog  at lunchtime.  Peripheral neuropathy and mood - Cymbalta  used for neuropathy and mood stabilization, with good effect. - Mood generally stable. - Discomfort with weather changes, attributed to arthritis.          Relevant past medical, surgical, family and social history reviewed and updated as indicated. Interim medical history since our last visit reviewed. Allergies and medications reviewed and updated.  Review of Systems  Constitutional:  Negative for chills and fever.  Eyes:  Negative for  redness and visual disturbance.  Respiratory:  Negative for chest tightness and shortness of breath.   Cardiovascular:  Negative for chest pain and leg swelling.  Genitourinary:  Negative for difficulty urinating.  Musculoskeletal:  Negative for back pain and gait problem.  Skin:  Positive for wound. Negative for rash.  Neurological:  Negative for dizziness, light-headedness and headaches.  Psychiatric/Behavioral:  Negative for agitation and behavioral problems.   All other systems reviewed and are negative.   Per HPI unless specifically indicated above   Allergies as of 11/21/2024       Reactions   Canagliflozin  Other (See Comments)   vaginitis   Pioglitazone Other (See Comments)   Edema        Medication List        Accurate as of November 21, 2024 12:32 PM. If you have any questions, ask your nurse or doctor.          STOP taking these medications    Trulicity  4.5 MG/0.5ML Soaj Generic drug: Dulaglutide  Stopped by: Fonda Levins, MD       TAKE these medications    Belbuca  300 MCG Film Generic drug: Buprenorphine  HCl Take 1 Film by mouth every 12 (twelve) hours.   DULoxetine  60 MG capsule Commonly known as: CYMBALTA  Take 1 capsule (60 mg total) by mouth at bedtime.   fluconazole  150 MG tablet Commonly known as: DIFLUCAN  Take 1 tablet (150 mg total) by mouth every 3 (three) days.   FreeStyle Libre 3 Plus  Sensor Misc Change sensor every 15 days. DX:E11.65   Lantus  SoloStar 100 UNIT/ML Solostar Pen Generic drug: insulin  glargine Inject 30 Units into the skin daily. What changed: how much to take   metFORMIN  500 MG 24 hr tablet Commonly known as: GLUCOPHAGE -XR Take 2 tablets (1,000 mg total) by mouth 2 (two) times daily with a meal.   Mounjaro  5 MG/0.5ML Pen Generic drug: tirzepatide  Inject 5 mg into the skin once a week. DX: E11.65, mail order   NovoLOG  FlexPen 100 UNIT/ML FlexPen Generic drug: insulin  aspart Inject 3-20 Units into the  skin 3 (three) times daily with meals. What changed: when to take this   pregabalin  150 MG capsule Commonly known as: LYRICA  1 in am 2 at night   rosuvastatin  5 MG tablet Commonly known as: Crestor  Take 1 tablet (5 mg total) by mouth daily.         Objective:   BP 109/66   Pulse 76   Temp 98.1 F (36.7 C)   Ht 5' 9 (1.753 m)   Wt 183 lb (83 kg)   SpO2 95%   BMI 27.02 kg/m   Wt Readings from Last 3 Encounters:  11/21/24 183 lb (83 kg)  11/01/24 194 lb (88 kg)  10/11/24 193 lb (87.5 kg)    Physical Exam Physical Exam   VITALS: BP- 109/66 NECK: Thyroid  without nodules. CHEST: Lungs clear to auscultation bilaterally. CARDIOVASCULAR: Regular rate and rhythm, no murmurs.         Assessment & Plan:   Problem List Items Addressed This Visit       Endocrine   Mild nonproliferative diabetic retinopathy of both eyes (HCC)   Hyperlipidemia associated with type 2 diabetes mellitus (HCC)   Diabetic peripheral neuropathy (HCC)   Relevant Medications   DULoxetine  (CYMBALTA ) 60 MG capsule   Diabetes mellitus (HCC) - Primary   Relevant Orders   CBC with Differential/Platelet   Bayer DCA Hb A1c Waived   Vitamin B12     Other   Major depressive disorder, recurrent episode, moderate (HCC)   Relevant Medications   DULoxetine  (CYMBALTA ) 60 MG capsule   Other Visit Diagnoses       Skin ulcer of left foot, limited to breakdown of skin (HCC)       Relevant Medications   DULoxetine  (CYMBALTA ) 60 MG capsule          Diabetic foot ulcer with bone involvement, left foot Chronic ulcer with bone involvement, non-healing, likely requiring amputation due to persistent infection. - Continue wound care management with orthopedic team. - Focus on glycemic control to aid healing.  Type 2 diabetes mellitus complicated by diabetic peripheral neuropathy Type 2 diabetes with fluctuating glucose levels, postprandial spikes, and managed neuropathy symptoms. - Restart Mounjaro  at  lower frequency, every ten days. - Administer 6-8 units of Novolog  at lunchtime. - Continue Lantus  at 30 units daily. - Refilled Cymbalta  prescription.  Major depressive disorder, recurrent, moderate Mood and neuropathy symptoms improved with Cymbalta . - Continue Cymbalta  for mood stabilization and neuropathy management.          Follow up plan: Return in about 3 months (around 02/19/2025), or if symptoms worsen or fail to improve, for Diabetes recheck.  Counseling provided for all of the vaccine components Orders Placed This Encounter  Procedures   CBC with Differential/Platelet   Bayer DCA Hb A1c Waived   Vitamin B12    Fonda Levins, MD [facility] 11/21/2024, 12:32 PM

## 2024-11-22 ENCOUNTER — Other Ambulatory Visit (HOSPITAL_BASED_OUTPATIENT_CLINIC_OR_DEPARTMENT_OTHER): Payer: Self-pay

## 2024-11-22 ENCOUNTER — Ambulatory Visit: Payer: Self-pay | Admitting: Family Medicine

## 2024-11-22 ENCOUNTER — Other Ambulatory Visit: Payer: Self-pay | Admitting: *Deleted

## 2024-11-22 LAB — CBC WITH DIFFERENTIAL/PLATELET
Basophils Absolute: 0 x10E3/uL (ref 0.0–0.2)
Basos: 0 %
EOS (ABSOLUTE): 0.3 x10E3/uL (ref 0.0–0.4)
Eos: 3 %
Hematocrit: 33.6 % — ABNORMAL LOW (ref 34.0–46.6)
Hemoglobin: 10.2 g/dL — ABNORMAL LOW (ref 11.1–15.9)
Immature Grans (Abs): 0 x10E3/uL (ref 0.0–0.1)
Immature Granulocytes: 0 %
Lymphocytes Absolute: 2.2 x10E3/uL (ref 0.7–3.1)
Lymphs: 25 %
MCH: 25.2 pg — ABNORMAL LOW (ref 26.6–33.0)
MCHC: 30.4 g/dL — ABNORMAL LOW (ref 31.5–35.7)
MCV: 83 fL (ref 79–97)
Monocytes Absolute: 0.6 x10E3/uL (ref 0.1–0.9)
Monocytes: 6 %
Neutrophils Absolute: 5.8 x10E3/uL (ref 1.4–7.0)
Neutrophils: 65 %
Platelets: 447 x10E3/uL (ref 150–450)
RBC: 4.05 x10E6/uL (ref 3.77–5.28)
RDW: 14.3 % (ref 11.7–15.4)
WBC: 8.9 x10E3/uL (ref 3.4–10.8)

## 2024-11-22 LAB — VITAMIN B12: Vitamin B-12: 192 pg/mL — ABNORMAL LOW (ref 232–1245)

## 2024-11-27 ENCOUNTER — Ambulatory Visit: Payer: Self-pay | Admitting: Family Medicine

## 2024-11-28 ENCOUNTER — Encounter: Payer: Self-pay | Admitting: Physician Assistant

## 2024-11-28 ENCOUNTER — Ambulatory Visit: Admitting: Physician Assistant

## 2024-11-28 DIAGNOSIS — T8131XS Disruption of external operation (surgical) wound, not elsewhere classified, sequela: Secondary | ICD-10-CM

## 2024-11-28 NOTE — Progress Notes (Signed)
 Pharmacy Quality Measure Review  This patient is appearing on a report for being at risk of failing the adherence measure for diabetes medications this calendar year.   Medication: Mounjaro  5mg /0.5 mL Last fill date: 12/12 for 28 day supply  Insurance report was not up to date. No action needed at this time.    Stoney Karczewski, PharmD [facility] Pharmacist

## 2024-11-28 NOTE — Progress Notes (Signed)
 Office Visit Note   Patient: Rhonda Thomas           Date of Birth: January 06, 1956           MRN: [id number] Visit Date: 11/28/2024              Requested by: Dettinger, Fonda LABOR, MD [address] [place],  [place] [address] PCP: Dettinger, Fonda LABOR, MD  Chief Complaint  Patient presents with   Left Foot - Routine Post Op    Left cal excision   Right Foot - Routine Post Op    10/11/24 Right 1st ray amputation      HPI: Rhonda Thomas is a 68 year old female with chronic foot wounds who presents with wound dehiscence and exposed bone in both feet. She returned to the OR on 10/11/24 for AMPUTATION, right FOOT, first RAY, EXCISION, LEFT CALCANEOUS BONE.   She is walking a little more in the house with a post op shoe.    Assessment & Plan: Visit Diagnoses: No diagnosis found.  Plan: The wound was pack with 1 4 x 4 damped with Vashe. Dry dressing then ace wrap. The right foot wound can be dressed with a band aide wet to dry with Vashe.   Elevation is keep to decrease the edema. If the incisions don't heal with time or she has infection she may need to consider amputation. I suggested she use her knee scooter on the left and is WBAT on the right foot.    We will try to get insurance approval for [facility] application.  Her calcaneal exostectomy has and surgery x 3 at this point the last return to surgery was 10/11/24.    Follow-Up Instructions: Return in about 18 days (around 12/16/2024).   Ortho Exam  Patient is alert, oriented, no adenopathy, well-dressed, normal affect, normal respiratory effort. The left heel wound is edematous and weeping clear fluid. There is a heel wound fissure that measures 1 cm deep. The rest of the wound measures 7.3 cm x 4 cm and 6 mm deep. The right medial foot incision has healed proximally and the distal incision wound is flat with 100 % granulation tissue.     Imaging: No results found.    Labs: Lab Results  Component Value Date   HGBA1C  7.9 (H) 11/21/2024   HGBA1C 8.5 (H) 10/11/2024   HGBA1C 8.5 (H) 08/22/2024   ESRSEDRATE 3 10/13/2020   CRP 3 10/13/2020   REPTSTATUS 09/30/2024 FINAL 09/25/2024   GRAMSTAIN NO WBC SEEN RARE GRAM NEGATIVE RODS  09/25/2024   CULT  09/25/2024    No growth aerobically or anaerobically. Performed at [facility], [address] [place], [place] [address]      Lab Results  Component Value Date   ALBUMIN 2.7 (L) 09/25/2024   ALBUMIN 3.4 (L) 08/22/2024   ALBUMIN 3.8 (L) 02/14/2024    No results found for: MG Lab Results  Component Value Date   VD25OH 17.4 (L) 10/13/2020   VD25OH 27.2 (L) 08/25/2017    No results found for: PREALBUMIN    Latest Ref Rng & Units 11/21/2024   12:48 PM 10/11/2024    4:11 PM 09/25/2024    9:00 AM  CBC EXTENDED  WBC 3.4 - 10.8 x10E3/uL 8.9  8.4  10.8   RBC 3.77 - 5.28 x10E6/uL 4.05  3.52  4.18   Hemoglobin 11.1 - 15.9 g/dL 89.7  9.4  88.6   HCT 65.9 - 46.6 %  33.6  30.2  36.5   Platelets 150 - 450 x10E3/uL 447  345  325   NEUT# 1.4 - 7.0 x10E3/uL 5.8   7.2   Lymph# 0.7 - 3.1 x10E3/uL 2.2   2.2      There is no height or weight on file to calculate BMI.  Orders:  No orders of the defined types were placed in this encounter.  No orders of the defined types were placed in this encounter.    Procedures: No procedures performed  Clinical Data: No additional findings.  ROS:  All other systems negative, except as noted in the HPI. Review of Systems  Objective: Vital Signs: There were no vitals taken for this visit.  Specialty Comments:  No specialty comments available.  PMFS History: Patient Active Problem List   Diagnosis Date Noted   Dehiscence of amputation stump of left lower extremity (HCC) 10/11/2024   Dehiscence of amputation stump of right lower extremity (HCC) 10/11/2024   Surgical wound breakdown 10/11/2024   Osteomyelitis of great toe of right foot (HCC) 09/25/2024   Acute osteomyelitis of left calcaneus  (HCC) 09/25/2024   Wound, open with complication 09/25/2024   Diabetes mellitus (HCC) 10/28/2022   Nuclear sclerotic cataract of both eyes 05/05/2022   Personal history of diabetic foot ulcer 08/10/2019   Bilateral carpal tunnel syndrome 02/25/2019   Major depressive disorder, recurrent episode, moderate (HCC) 02/09/2018   Diabetic peripheral neuropathy (HCC) 11/07/2017   Vitamin B12 deficiency 11/07/2017   DDD (degenerative disc disease), lumbosacral 10/02/2017   Overweight (BMI 25.0-29.9) 10/24/2016   Somnolence, daytime 03/10/2015   Generalized anxiety disorder 06/21/2013   Attention deficit disorder 06/21/2013   Hyperlipidemia associated with type 2 diabetes mellitus (HCC) 06/21/2013   Restless legs syndrome (RLS) 06/20/2013   Mild nonproliferative diabetic retinopathy of both eyes (HCC) 06/20/2013   Past Medical History:  Diagnosis Date   Anxiety    Attention deficit disorder (ADD)    Chronic foot pain 09/17/2024   bilateral - hx chronic foot ulcers   Depression    Diabetes (HCC)    Hiatal hernia    HLD (hyperlipidemia)    no meds, diet controlled per pt on 1014/25   Movement disorder    Neuropathy of both feet 08/21/2024   in [facility]   Osteomyelitis (HCC) 09/17/2024   left calcaneus and great toe of right foot   Restless legs syndrome (RLS) 08/21/2024   in [facility]   Schatzki's ring    Sleep apnea    does not use CPAP - intolerance to mask    Family History  Problem Relation Age of Onset   Stroke Mother    Sleep apnea Mother    Diabetes Father    Heart disease Father        pacemaker   Sleep apnea Father    Diabetes Other    Heart Problems Other    Colon cancer Neg Hx    Breast cancer Neg Hx     Past Surgical History:  Procedure Laterality Date   ABDOMINAL HYSTERECTOMY     AMPUTATION Right 10/11/2024   Procedure: AMPUTATION, FOOT, RAY;  Surgeon: Harden Jerona GAILS, MD;  Location: [facility] OR;  Service: Orthopedics;  Laterality: Right;  Right 1st Ray Amputation   AMPUTATION  TOE Right 09/25/2024   Procedure: AMPUTATION, TOE;  Surgeon: Harden Jerona GAILS, MD;  Location: [facility] OR;  Service: Orthopedics;  Laterality: Right;  RIGHT GREAT TOE AMPUTATION   APPLICATION OF WOUND VAC N/A 09/25/2024  Procedure: APPLICATION, WOUND VAC;  Surgeon: Harden Jerona GAILS, MD;  Location: [facility] OR;  Service: Orthopedics;  Laterality: N/A;   APPLICATION OF WOUND VAC Left 10/11/2024   Procedure: APPLICATION, WOUND VAC;  Surgeon: Harden Jerona GAILS, MD;  Location: [facility] OR;  Service: Orthopedics;  Laterality: Left;   BREAST LUMPECTOMY WITH RADIOACTIVE SEED LOCALIZATION Right 04/25/2016   Procedure: RIGHT BREAST LUMPECTOMY WITH RADIOACTIVE SEED LOCALIZATION;  Surgeon: Deward Null III, MD;  Location: [facility];  Service: General;  Laterality: Right;   CATARACT EXTRACTION, BILATERAL Bilateral    CHOLECYSTECTOMY     COLONOSCOPY  2013   FOOT SURGERY Left 05/2024   at [facility]   METATARSAL HEAD EXCISION Left 09/25/2024   Procedure: EXCISION, METATARSAL BONE, HEAD;  Surgeon: Harden Jerona GAILS, MD;  Location: [facility] OR;  Service: Orthopedics;  Laterality: Left;  LEFT PARTIAL CALCANEOUS EXCISION   METATARSAL HEAD EXCISION Left 10/11/2024   Procedure: EXCISION, LEFT CALCANEOUS BONE;  Surgeon: Harden Jerona GAILS, MD;  Location: [facility] OR;  Service: Orthopedics;  Laterality: Left;  LEFT PARTIAL CALCANEUS EXCISION   RECTOCELE REPAIR     SPINAL CORD STIMULATOR IMPLANT     not currently working per pt on 09/24/24   TOE AMPUTATION Right    2nd toe rt foot   TUBAL LIGATION     UPPER GI ENDOSCOPY  2016   Social History   Occupational History   Occupation: Retired    Associate Professor: [employer]    Comment: [employer]  Tobacco Use   Smoking status: Never   Smokeless tobacco: Never  Vaping Use   Vaping status: Never Used  Substance and Sexual Activity   Alcohol use: No    Alcohol/week: 0.0 standard drinks of alcohol   Drug use: Never   Sexual activity: Yes    Birth control/protection:  Surgical    Comment: Hysterectomy

## 2024-12-16 ENCOUNTER — Encounter: Payer: Self-pay | Admitting: Physician Assistant

## 2024-12-16 ENCOUNTER — Other Ambulatory Visit: Payer: Self-pay

## 2024-12-16 ENCOUNTER — Ambulatory Visit (INDEPENDENT_AMBULATORY_CARE_PROVIDER_SITE_OTHER): Admitting: Physician Assistant

## 2024-12-16 DIAGNOSIS — T8131XS Disruption of external operation (surgical) wound, not elsewhere classified, sequela: Secondary | ICD-10-CM

## 2024-12-16 NOTE — Progress Notes (Signed)
 "  Office Visit Note   Patient: Rhonda Thomas           Date of Birth: Sep 16, 1956           MRN: [id number] Visit Date: 12/16/2024              Requested by: Dettinger, Fonda LABOR, MD [address] [place],  [place] [address] PCP: Dettinger, Fonda LABOR, MD  Chief Complaint  Patient presents with   Right Foot - Routine Post Op     10/11/24 Right 1st ray amputation     Left Foot - Routine Post Op    Calcaneal excision       HPI: Rhonda Thomas is a 69 year old female with chronic foot wounds who presents with wound dehiscence and exposed bone in both feet. She returned to the OR on 10/11/24 for AMPUTATION, right FOOT, first RAY, EXCISION, LEFT CALCANEOUS BONE.              She is walking a little more in the house with a post op shoe.  I suggested last time that she wear the Prfo boot to keep the pressure off her heel.    She has just lost her son in his 50's due to a GSW in [place] .  They are thinking about driving up there to bring him home.    Assessment & Plan: Visit Diagnoses:  1. Disruption of external surgical wound, sequela     Plan: The wound was pack with 1 4 x 4 damped with Vashe. Dry dressing then ace wrap. The right foot wound can be dressed with a band aide wet to dry with Vashe.   Elevation is keep to decrease the edema. If the incisions don't heal with time or she has infection she may need to consider amputation.   She was given a PRAFO boot to off load the heel.    Follow-Up Instructions: No follow-ups on file.   Ortho Exam  Patient is alert, oriented, no adenopathy, well-dressed, normal affect, normal respiratory effort. The wound is 1 cm deep measuring 8 cm x 2 cm.  There is a shelf laterally where th old skin became macerated.  No necrotic tissue.  Clear drainage.  100% granulation.   The right foot medial incision has healed.     Heel x ray shows no sign of bone infection.  The achilles has pulled the calcaneous attachment superior.  No lytic lesions  (bone destruction), cortical thinning.    Imaging:   Labs: Lab Results  Component Value Date   HGBA1C 7.9 (H) 11/21/2024   HGBA1C 8.5 (H) 10/11/2024   HGBA1C 8.5 (H) 08/22/2024   ESRSEDRATE 3 10/13/2020   CRP 3 10/13/2020   REPTSTATUS 09/30/2024 FINAL 09/25/2024   GRAMSTAIN NO WBC SEEN RARE GRAM NEGATIVE RODS  09/25/2024   CULT  09/25/2024    No growth aerobically or anaerobically. Performed at [facility], [address] [place], [place] [address]      Lab Results  Component Value Date   ALBUMIN 2.7 (L) 09/25/2024   ALBUMIN 3.4 (L) 08/22/2024   ALBUMIN 3.8 (L) 02/14/2024    No results found for: MG Lab Results  Component Value Date   VD25OH 17.4 (L) 10/13/2020   VD25OH 27.2 (L) 08/25/2017    No results found for: PREALBUMIN    Latest Ref Rng & Units 11/21/2024   12:48 PM 10/11/2024    4:11 PM 09/25/2024    9:00 AM  CBC EXTENDED  WBC 3.4 - 10.8 x10E3/uL 8.9  8.4  10.8   RBC 3.77 - 5.28 x10E6/uL 4.05  3.52  4.18   Hemoglobin 11.1 - 15.9 g/dL 89.7  9.4  88.6   HCT 65.9 - 46.6 % 33.6  30.2  36.5   Platelets 150 - 450 x10E3/uL 447  345  325   NEUT# 1.4 - 7.0 x10E3/uL 5.8   7.2   Lymph# 0.7 - 3.1 x10E3/uL 2.2   2.2      There is no height or weight on file to calculate BMI.  Orders:  Orders Placed This Encounter  Procedures   XR Os Calcis Left   No orders of the defined types were placed in this encounter.    Procedures: No procedures performed  Clinical Data: No additional findings.  ROS:  All other systems negative, except as noted in the HPI. Review of Systems  Objective: Vital Signs: There were no vitals taken for this visit.  Specialty Comments:  No specialty comments available.  PMFS History: Patient Active Problem List   Diagnosis Date Noted   Dehiscence of amputation stump of left lower extremity (HCC) 10/11/2024   Dehiscence of amputation stump of right lower extremity (HCC) 10/11/2024   Surgical wound breakdown  10/11/2024   Osteomyelitis of great toe of right foot (HCC) 09/25/2024   Acute osteomyelitis of left calcaneus (HCC) 09/25/2024   Wound, open with complication 09/25/2024   Diabetes mellitus (HCC) 10/28/2022   Nuclear sclerotic cataract of both eyes 05/05/2022   Personal history of diabetic foot ulcer 08/10/2019   Bilateral carpal tunnel syndrome 02/25/2019   Major depressive disorder, recurrent episode, moderate (HCC) 02/09/2018   Diabetic peripheral neuropathy (HCC) 11/07/2017   Vitamin B12 deficiency 11/07/2017   DDD (degenerative disc disease), lumbosacral 10/02/2017   Overweight (BMI 25.0-29.9) 10/24/2016   Somnolence, daytime 03/10/2015   Generalized anxiety disorder 06/21/2013   Attention deficit disorder 06/21/2013   Hyperlipidemia associated with type 2 diabetes mellitus (HCC) 06/21/2013   Restless legs syndrome (RLS) 06/20/2013   Mild nonproliferative diabetic retinopathy of both eyes (HCC) 06/20/2013   Past Medical History:  Diagnosis Date   Anxiety    Attention deficit disorder (ADD)    Chronic foot pain 09/17/2024   bilateral - hx chronic foot ulcers   Depression    Diabetes (HCC)    Hiatal hernia    HLD (hyperlipidemia)    no meds, diet controlled per pt on 1014/25   Movement disorder    Neuropathy of both feet 08/21/2024   in [place]   Osteomyelitis (HCC) 09/17/2024   left calcaneus and great toe of right foot   Restless legs syndrome (RLS) 08/21/2024   in [place]   Schatzki's ring    Sleep apnea    does not use CPAP - intolerance to mask    Family History  Problem Relation Age of Onset   Stroke Mother    Sleep apnea Mother    Diabetes Father    Heart disease Father        pacemaker   Sleep apnea Father    Diabetes Other    Heart Problems Other    Colon cancer Neg Hx    Breast cancer Neg Hx     Past Surgical History:  Procedure Laterality Date   ABDOMINAL HYSTERECTOMY     AMPUTATION Right 10/11/2024   Procedure: AMPUTATION, FOOT, RAY;  Surgeon: Harden Jerona GAILS, MD;  Location: [facility] OR;  Service: Orthopedics;  Laterality: Right;  Right 1st Ray Amputation   AMPUTATION TOE Right 09/25/2024   Procedure: AMPUTATION, TOE;  Surgeon: Harden Jerona GAILS, MD;  Location: [facility] OR;  Service: Orthopedics;  Laterality: Right;  RIGHT GREAT TOE AMPUTATION   APPLICATION OF WOUND VAC N/A 09/25/2024   Procedure: APPLICATION, WOUND VAC;  Surgeon: Harden Jerona GAILS, MD;  Location: [facility] OR;  Service: Orthopedics;  Laterality: N/A;   APPLICATION OF WOUND VAC Left 10/11/2024   Procedure: APPLICATION, WOUND VAC;  Surgeon: Harden Jerona GAILS, MD;  Location: [facility] OR;  Service: Orthopedics;  Laterality: Left;   BREAST LUMPECTOMY WITH RADIOACTIVE SEED LOCALIZATION Right 04/25/2016   Procedure: RIGHT BREAST LUMPECTOMY WITH RADIOACTIVE SEED LOCALIZATION;  Surgeon: Deward Null III, MD;  Location: [facility];  Service: General;  Laterality: Right;   CATARACT EXTRACTION, BILATERAL Bilateral    CHOLECYSTECTOMY     COLONOSCOPY  2013   FOOT SURGERY Left 05/2024   at [facility]   METATARSAL HEAD EXCISION Left 09/25/2024   Procedure: EXCISION, METATARSAL BONE, HEAD;  Surgeon: Harden Jerona GAILS, MD;  Location: [facility] OR;  Service: Orthopedics;  Laterality: Left;  LEFT PARTIAL CALCANEOUS EXCISION   METATARSAL HEAD EXCISION Left 10/11/2024   Procedure: EXCISION, LEFT CALCANEOUS BONE;  Surgeon: Harden Jerona GAILS, MD;  Location: [facility] OR;  Service: Orthopedics;  Laterality: Left;  LEFT PARTIAL CALCANEUS EXCISION   RECTOCELE REPAIR     SPINAL CORD STIMULATOR IMPLANT     not currently working per pt on 09/24/24   TOE AMPUTATION Right    2nd toe rt foot   TUBAL LIGATION     UPPER GI ENDOSCOPY  2016   Social History   Occupational History   Occupation: Retired    Associate Professor: [employer]    Comment: [employer]  Tobacco Use   Smoking status: Never   Smokeless tobacco: Never  Vaping Use   Vaping status: Never Used  Substance and Sexual Activity   Alcohol use: No     Alcohol/week: 0.0 standard drinks of alcohol   Drug use: Never   Sexual activity: Yes    Birth control/protection: Surgical    Comment: Hysterectomy       "

## 2024-12-18 ENCOUNTER — Other Ambulatory Visit (HOSPITAL_BASED_OUTPATIENT_CLINIC_OR_DEPARTMENT_OTHER): Payer: Self-pay

## 2024-12-18 ENCOUNTER — Other Ambulatory Visit (INDEPENDENT_AMBULATORY_CARE_PROVIDER_SITE_OTHER)

## 2024-12-18 DIAGNOSIS — Z794 Long term (current) use of insulin: Secondary | ICD-10-CM

## 2024-12-18 DIAGNOSIS — Z7985 Long-term (current) use of injectable non-insulin antidiabetic drugs: Secondary | ICD-10-CM

## 2024-12-18 DIAGNOSIS — E119 Type 2 diabetes mellitus without complications: Secondary | ICD-10-CM

## 2024-12-18 MED ORDER — TIRZEPATIDE 2.5 MG/0.5ML ~~LOC~~ SOAJ
2.5000 mg | SUBCUTANEOUS | 4 refills | Status: AC
  Filled 2024-12-18: qty 2, 28d supply, fill #0

## 2024-12-19 ENCOUNTER — Ambulatory Visit: Admitting: Psychology

## 2024-12-30 ENCOUNTER — Encounter: Attending: Psychology | Admitting: Psychology

## 2024-12-30 ENCOUNTER — Encounter: Payer: Self-pay | Admitting: Psychology

## 2024-12-30 DIAGNOSIS — F331 Major depressive disorder, recurrent, moderate: Secondary | ICD-10-CM | POA: Insufficient documentation

## 2024-12-30 DIAGNOSIS — E1142 Type 2 diabetes mellitus with diabetic polyneuropathy: Secondary | ICD-10-CM | POA: Diagnosis not present

## 2024-12-30 DIAGNOSIS — F411 Generalized anxiety disorder: Secondary | ICD-10-CM | POA: Diagnosis not present

## 2024-12-30 DIAGNOSIS — F5101 Primary insomnia: Secondary | ICD-10-CM | POA: Diagnosis not present

## 2024-12-30 DIAGNOSIS — Z79891 Long term (current) use of opiate analgesic: Secondary | ICD-10-CM | POA: Insufficient documentation

## 2024-12-30 DIAGNOSIS — Z794 Long term (current) use of insulin: Secondary | ICD-10-CM | POA: Diagnosis not present

## 2024-12-30 DIAGNOSIS — Z5181 Encounter for therapeutic drug level monitoring: Secondary | ICD-10-CM | POA: Insufficient documentation

## 2024-12-30 DIAGNOSIS — G894 Chronic pain syndrome: Secondary | ICD-10-CM | POA: Insufficient documentation
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
## 2025-01-06 ENCOUNTER — Ambulatory Visit: Admitting: Physician Assistant

## 2025-01-08 ENCOUNTER — Encounter: Admitting: Physical Medicine and Rehabilitation

## 2025-01-08 ENCOUNTER — Encounter: Payer: Self-pay | Admitting: Physical Medicine and Rehabilitation

## 2025-01-08 VITALS — BP 113/71 | HR 87 | Ht 69.0 in | Wt 186.0 lb

## 2025-01-08 DIAGNOSIS — Z79891 Long term (current) use of opiate analgesic: Secondary | ICD-10-CM

## 2025-01-08 DIAGNOSIS — Z5181 Encounter for therapeutic drug level monitoring: Secondary | ICD-10-CM

## 2025-01-08 DIAGNOSIS — G894 Chronic pain syndrome: Secondary | ICD-10-CM

## 2025-01-08 NOTE — Patient Instructions (Addendum)
" °  VISIT SUMMARY: During your visit, we discussed your chronic neuropathic pain, pain management, and the evaluation of your left heel wound. We reviewed your current medications and made adjustments to optimize your pain relief. We also addressed your long-term opioid therapy and provided recommendations for managing your left heel wound and right foot care.  YOUR PLAN: CHRONIC PAIN SYNDROME WITH NEUROPATHIC FEATURES  :   -Continue taking Belbuca  at the current dose - patient has refills and will call office 1 week before next due so we can send to [facility] pharmacy in [place], [place] -Continue taking Lyrica  150 mg twice daily; can used 3rd dose PRN if needed. -Contact the spinal cord stimulator representative for setting adjustments to improve tolerance. -Continue use of ibuprofen or acetaminophen  for breakthrough pain--reviewed labs from October normal renal function. -Try the  CURABLE mindfulness app for additional pain management and continue with Dr Corina -Schedule follow-up appointments every three months.  THERAPEUTIC DRUG MONITORING FOR CHRONIC OPIOID THERAPY: We are monitoring your compliance with your opioid regimen. -We performed a mouth swab for drug screening. -We reviewed your urine drug screen from 08/21/24 which was as expected  LONG-TERM OPIOID ANALGESIC THERAPY: You are on Belbuca  for long-term pain relief. -We sent your Belbuca  prescription to your preferred pharmacy. -Contact your pharmacy one week before you run out of medication. -Use your existing prescriptions before filling new ones.   LEFT HEEL WOUND: Follow up with Dr Trude  RIGHT FOOT PARTIAL RAY AMPUTATION: You have good function  post-amputation with no new wounds or complications. -Continue monitoring and routine foot care.    Contains text generated by Abridge.    "

## 2025-01-08 NOTE — Progress Notes (Signed)
 "  Subjective:    Patient ID: Rhonda Thomas, female    DOB: [birth year], 69 y.o.   MRN: [id number]  HPI   Rhonda Thomas is a 69 y.o. year old female  who  has a past medical history of Anxiety, Attention deficit disorder (ADD), Chronic foot pain (09/17/2024), Depression, Diabetes (HCC), Hiatal hernia, HLD (hyperlipidemia), Movement disorder, Neuropathy of both feet (08/21/2024), Osteomyelitis (HCC) (09/17/2024), Restless legs syndrome (RLS) (08/21/2024), Schatzki's ring, and Sleep apnea.   They are presenting to PM&R clinic for follow up related to chronic pain management with bilateral lower extremity neuropathies and foot wounds, right partial amputation.   Discussed the use of AI scribe software for clinical note transcription with the patient, who gave verbal consent to proceed.  History of Present Illness   Rhonda Thomas is a 69 year old female with chronic neuropathic pain and bilateral partial foot amputations who presents for pain management follow-up and evaluation of a left heel wound.  She has longstanding neuropathic pain and restless legs involving both lower extremities to just above the ankles and neuropathic symptoms in her hands without numbness or weakness. The pain is persistent with worsening in the left heel since recent surgery. She denies muscle weakness or dropping objects.  She underwent left partial foot amputation on October 11, 2024 and over the past few days has noted dehiscence and poor healing at the surgical site. She wears a protective boot and tries to limit weight-bearing, which is difficult for her due to ADHD. She is independent with dressing and bathing. She has osteomyelitis in both feet with two prior operations and a right partial ray amputation. She denies current right foot wounds.  Her pain regimen includes a Environmental Manager spinal cord stimulator that previously provided partial leg pain relief but now often worsens her foot pain,  so she is not using it. She had heel spur surgery in 2019 with minimal relief. Current medications include Belbuca  300 mcg twice daily, Lyrica  150 mg twice daily (prescribed three times daily), and Cymbalta  60 mg at bedtime, which helps pain and mood. She occasionally uses ibuprofen or acetaminophen  for breakthrough pain with benefit. Flexeril  was ineffective and sedating. She discontinued oxycodone  and Nucynta due to cost, insurance, and safety concerns.  She has had difficulty accessing Belbuca  during hospitalizations, including missed doses, but finds it effective despite disliking its taste and packaging. She reports no side effects from current medications. She denies kidney or cardiac disease, atrial fibrillation, and use of recreational drugs, tobacco, or alcohol.         Pain Inventory Average Pain 2 Pain Right Now 4 My pain is constant, sharp, burning, dull, stabbing, tingling, aching, and neuropathy  In the last 24 hours, has pain interfered with the following? General activity 1 Relation with others 1 Enjoyment of life 1 What TIME of day is your pain at its worst? evening Sleep (in general) NA  Pain is worse with: sitting and inactivity Pain improves with: heat/ice, therapy/exercise, pacing activities, and medication Relief from Meds: 2  walk with assistance ability to climb steps?  no do you drive?  no use a wheelchair needs help with transfers transfers alone  disabled: date disabled . retired I need assistance with the following:  meal prep, household duties, and shopping  numbness tingling trouble walking spasms anxiety  Any changes since last visit?  yes was at previous pain clinic and wanted to change  Any changes since last visit?  no Psychologist Dr  Rodenbough    Family History  Problem Relation Age of Onset   Stroke Mother    Sleep apnea Mother    Diabetes Father    Heart disease Father        pacemaker   Sleep apnea Father    Diabetes Other     Heart Problems Other    Colon cancer Neg Hx    Breast cancer Neg Hx    Social History   Socioeconomic History   Marital status: Married    Spouse name: Lynwood   Number of children: 3   Years of education: college   Highest education level: Master's degree (e.g., MA, MS, MEng, MEd, MSW, MBA)  Occupational History   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  Other Topics Concern   Not on file  Social History Narrative   Patient lives at home with her husband. Sydnee).   Education- College   Right handed.   Caffeine- occasional soft drink      Social Drivers of Health   Tobacco Use: Low Risk (01/08/2025)   Patient History    Smoking Tobacco Use: Never    Smokeless Tobacco Use: Never    Passive Exposure: Not on file  Financial Resource Strain: Low Risk (04/17/2024)   Received from [facility]   Overall Financial Resource Strain (CARDIA)    Difficulty of Paying Living Expenses: Not very hard  Food Insecurity: No Food Insecurity (11/01/2024)   Epic    Worried About Radiation Protection Practitioner of Food in the Last Year: Never true    Ran Out of Food in the Last Year: Never true  Transportation Needs: No Transportation Needs (11/01/2024)   Epic    Lack of Transportation (Medical): No    Lack of Transportation (Non-Medical): No  Physical Activity: Inactive (11/01/2024)   Exercise Vital Sign    Days of Exercise per Week: 0 days    Minutes of Exercise per Session: 0 min  Stress: Stress Concern Present (11/01/2024)   [employer] of Occupational Health - Occupational Stress Questionnaire    Feeling of Stress: Very much  Social  Connections: Moderately Integrated (11/01/2024)   Social Connection and Isolation Panel    Frequency of Communication with Friends and Family: More than three times a week    Frequency of Social Gatherings with Friends and Family: More than three times a week    Attends Religious Services: More than 4 times per year    Active Member of Clubs or Organizations: No    Attends Banker Meetings: Never    Marital Status: Married  Depression (PHQ2-9): Medium Risk (11/21/2024)   Depression (PHQ2-9)    PHQ-2 Score: 8  Alcohol Screen: Low Risk (11/01/2023)   Alcohol Screen    Last Alcohol Screening Score (AUDIT): 0  Housing: Unknown (11/01/2024)   Epic    Unable to Pay for Housing in the Last Year: No    Number of Times Moved in the Last Year: Not on file    Homeless in the Last Year: No  Utilities: Not At Risk (11/01/2024)  Epic    Threatened with loss of utilities: No  Health Literacy: Adequate Health Literacy (11/01/2024)   B1300 Health Literacy    Frequency of need for help with medical instructions: Never   Past Surgical History:  Procedure Laterality Date   ABDOMINAL HYSTERECTOMY     AMPUTATION Right 10/11/2024   Procedure: AMPUTATION, FOOT, RAY;  Surgeon: Harden Jerona GAILS, MD;  Location: [facility] OR;  Service: Orthopedics;  Laterality: Right;  Right 1st Ray Amputation   AMPUTATION TOE Right 09/25/2024   Procedure: AMPUTATION, TOE;  Surgeon: Harden Jerona GAILS, MD;  Location: [facility] OR;  Service: Orthopedics;  Laterality: Right;  RIGHT GREAT TOE AMPUTATION   APPLICATION OF WOUND VAC N/A 09/25/2024   Procedure: APPLICATION, WOUND VAC;  Surgeon: Harden Jerona GAILS, MD;  Location: [facility] OR;  Service: Orthopedics;  Laterality: N/A;   APPLICATION OF WOUND VAC Left 10/11/2024   Procedure: APPLICATION, WOUND VAC;  Surgeon: Harden Jerona GAILS, MD;  Location: [facility] OR;  Service: Orthopedics;  Laterality: Left;   BREAST LUMPECTOMY WITH RADIOACTIVE SEED LOCALIZATION Right 04/25/2016   Procedure: RIGHT BREAST  LUMPECTOMY WITH RADIOACTIVE SEED LOCALIZATION;  Surgeon: Deward Null III, MD;  Location: [facility];  Service: General;  Laterality: Right;   CATARACT EXTRACTION, BILATERAL Bilateral    CHOLECYSTECTOMY     COLONOSCOPY  2013   FOOT SURGERY Left 05/2024   at [facility]   METATARSAL HEAD EXCISION Left 09/25/2024   Procedure: EXCISION, METATARSAL BONE, HEAD;  Surgeon: Harden Jerona GAILS, MD;  Location: [facility] OR;  Service: Orthopedics;  Laterality: Left;  LEFT PARTIAL CALCANEOUS EXCISION   METATARSAL HEAD EXCISION Left 10/11/2024   Procedure: EXCISION, LEFT CALCANEOUS BONE;  Surgeon: Harden Jerona GAILS, MD;  Location: [facility] OR;  Service: Orthopedics;  Laterality: Left;  LEFT PARTIAL CALCANEUS EXCISION   RECTOCELE REPAIR     SPINAL CORD STIMULATOR IMPLANT     not currently working per pt on 09/24/24   TOE AMPUTATION Right    2nd toe rt foot   TUBAL LIGATION     UPPER GI ENDOSCOPY  2016   Past Medical History:  Diagnosis Date   Anxiety    Attention deficit disorder (ADD)    Chronic foot pain 09/17/2024   bilateral - hx chronic foot ulcers   Depression    Diabetes (HCC)    Hiatal hernia    HLD (hyperlipidemia)    no meds, diet controlled per pt on 1014/25   Movement disorder    Neuropathy of both feet 08/21/2024   in [place]   Osteomyelitis (HCC) 09/17/2024   left calcaneus and great toe of right foot   Restless legs syndrome (RLS) 08/21/2024   in [place]   Schatzki's ring    Sleep apnea    does not use CPAP - intolerance to mask   Ht 5' 9 (1.753 m)   BMI 27.02 kg/m   Opioid Risk Score:   Fall Risk Score:  `1  Depression screen [facility] 2/9     11/21/2024   11:57 AM 11/01/2024   12:17 PM 08/22/2024    9:41 AM 05/16/2024    9:31 AM 02/14/2024    9:51 AM 11/08/2023   11:17 AM 11/01/2023    1:36 PM  Depression screen PHQ 2/9  Decreased Interest 0 0 0 0 0 0 0  Down, Depressed, Hopeless 3 3 0 0 0 0 0  PHQ - 2 Score 3 3 0 0 0 0 0  Altered sleeping 3 3  0  0  Tired, decreased energy 0 0  1   1   Change in appetite 0 0  0  0   Feeling bad or failure about yourself  1 1  0  0   Trouble concentrating 1 1  1  1    Moving slowly or fidgety/restless 0 0  0  0   Suicidal thoughts 0 0  0  0   PHQ-9 Score 8 8  2   2     Difficult doing work/chores Not difficult at all   Not difficult at all  Not difficult at all      Data saved with a previous flowsheet row definition    Review of Systems  Endocrine:       High blood sugar  Musculoskeletal:  Positive for gait problem.       Spasms  Neurological:  Positive for numbness.       Tingling  Psychiatric/Behavioral:  The patient is nervous/anxious.   All other systems reviewed and are negative.      Objective:   Physical Exam   PE: Constitution: Appropriate appearance for age. No apparent distress. Resp: No respiratory distress. No accessory muscle usage. on RA Cardio: Well perfused appearance. No peripheral edema. Abdomen: Nondistended. Nontender.   Psych: Appropriate mood and affect. Neuro: AAOx4. No apparent cognitive deficits   Neurologic Exam:   DTRs: Reflexes were 2+ in bilateral achilles, patella, biceps, BR and triceps. Babinsky: flexor responses b/l.   Hoffmans: negative b/l Sensory exam: revealed normal sensation in all dermatomal regions in bilateral upper extremities, bilateral lower extremities, and with reduced sensation to light touch in bilateral feet starting at the ankles Motor exam: strength 5/5 throughout bilateral upper extremities, bilateral lower extremities, and with exception of bilateral PF 4/5 Coordination: Fine motor coordination was normal.    SMK: + LLE CAM boot - heel wrapped in clean, dry dressing - good capillary refill on toes, some distal lacarations on 3-4th toe tips with beefy red wound bed. + 2+ foot edema.   RLE s/p partial ray amputation digits 4-5; healed, no apparent drainage          Assessment & Plan:   Rhonda Thomas is a 69 y.o. year old female  who  has a past  medical history of Anxiety, Attention deficit disorder (ADD), Chronic foot pain (09/17/2024), Depression, Diabetes (HCC), Hiatal hernia, HLD (hyperlipidemia), Movement disorder, Neuropathy of both feet (08/21/2024), Osteomyelitis (HCC) (09/17/2024), Restless legs syndrome (RLS) (08/21/2024), Schatzki's ring, and Sleep apnea.   They are presenting to PM&R clinic for bilateral LE neuropathy and   Assessment and Plan    Chronic pain syndrome with neuropathic features and lower extremity amputation Persistent neuropathic pain post-amputation managed with Belbuca  and Lyrica . Spinal cord stimulator provides variable relief. Current management optimal. - Continued Belbuca  at current dose. - Continued Lyrica  150 mg twice daily. - Contact spinal cord stimulator representative for setting adjustments. - Recommended ibuprofen or acetaminophen  for breakthrough pain. - Recommended CURABLE mindfulness app trial. - Encouraged follow-up with podiatry and pain management.  Therapeutic drug monitoring for chronic opioid therapy Compliant with regimen. Recent urine drug screen consistent with prescribed medications. - Performed mouth swab for drug screening. - Reviewed urine drug screen from 08/21/24.  Long-term opioid analgesic therapy Belbuca  maintained for long-term analgesia. Previous opioids discontinued due to cost and safety. Belbuca  is safest option. - Currently believes she has fills of Belbuca  through March; advised to contact our office one week prior to running out so we can  take over next fill - Advised use of existing prescriptions before filling new ones. - Scheduled follow-up every three months.  Left heel wound Recovering from left partial foot amputation with a cracked heel wound.  Right foot partial ray amputation Post-amputation for osteomyelitis with good function and perfusion. No new wounds or complications. - Assessed right foot; no new issues identified. - Encouraged continued  monitoring and routine foot care.          "

## 2025-01-09 ENCOUNTER — Ambulatory Visit: Admitting: Physician Assistant

## 2025-01-09 ENCOUNTER — Telehealth: Payer: Self-pay

## 2025-01-09 DIAGNOSIS — T8131XS Disruption of external operation (surgical) wound, not elsewhere classified, sequela: Secondary | ICD-10-CM | POA: Diagnosis not present

## 2025-01-09 NOTE — Telephone Encounter (Signed)
 Patient has form at front desk for a handicap placard to be complete.

## 2025-01-09 NOTE — Progress Notes (Signed)
 "  Office Visit Note   Patient: Rhonda Thomas           Date of Birth: 02/19/1956           MRN: [id number] Visit Date: 01/09/2025              Requested by: Dettinger, Fonda LABOR, MD [address] [place],  [place] [address] PCP: Dettinger, Fonda LABOR, MD  Chief Complaint  Patient presents with   Right Foot - Routine Post Op    10/11/24 Right 1st ray amputation      HPI: Rhonda Thomas is a 69 year old female with chronic foot wounds who presents with wound dehiscence and exposed bone in both feet. She returned to the OR on 10/11/24 for AMPUTATION, right FOOT, first RAY, EXCISION, LEFT CALCANEOUS BONE.   Her husband is helping her change the dressing.  They are using Vashe wet to dry.    Assessment & Plan: Visit Diagnoses:  1. Disruption of external surgical wound, sequela     Plan: Chronic left heel wound They will continue to pack the wound with Vashe wet to dry dressing daily.  PRAFO boot to float the heel  We will try to get approval for Kerecis tissue graft.  She may also benefit from Nitrodur patches possibly.  Follow-Up Instructions: Return in about 3 weeks (around 01/30/2025).   Ortho Exam  Patient is alert, oriented, no adenopathy, well-dressed, normal affect, normal respiratory effort. The wound measures 8 cm x 2 cm and 1 cm depth.  100 % granulatio tissue.  No cellulitis or active drainage.      Labs: Lab Results  Component Value Date   HGBA1C 7.9 (H) 11/21/2024   HGBA1C 8.5 (H) 10/11/2024   HGBA1C 8.5 (H) 08/22/2024   ESRSEDRATE 3 10/13/2020   CRP 3 10/13/2020   REPTSTATUS 09/30/2024 FINAL 09/25/2024   GRAMSTAIN NO WBC SEEN RARE GRAM NEGATIVE RODS  09/25/2024   CULT  09/25/2024    No growth aerobically or anaerobically. Performed at [facility], [address], [place], [place] [address]      Lab Results  Component Value Date   ALBUMIN 2.7 (L) 09/25/2024   ALBUMIN 3.4 (L) 08/22/2024   ALBUMIN 3.8 (L) 02/14/2024    No results  found for: MG Lab Results  Component Value Date   VD25OH 17.4 (L) 10/13/2020   VD25OH 27.2 (L) 08/25/2017    No results found for: PREALBUMIN    Latest Ref Rng & Units 11/21/2024   12:48 PM 10/11/2024    4:11 PM 09/25/2024    9:00 AM  CBC EXTENDED  WBC 3.4 - 10.8 x10E3/uL 8.9  8.4  10.8   RBC 3.77 - 5.28 x10E6/uL 4.05  3.52  4.18   Hemoglobin 11.1 - 15.9 g/dL 89.7  9.4  88.6   HCT 65.9 - 46.6 % 33.6  30.2  36.5   Platelets 150 - 450 x10E3/uL 447  345  325   NEUT# 1.4 - 7.0 x10E3/uL 5.8   7.2   Lymph# 0.7 - 3.1 x10E3/uL 2.2   2.2      There is no height or weight on file to calculate BMI.  Orders:  No orders of the defined types were placed in this encounter.  No orders of the defined types were placed in this encounter.    Procedures: No procedures performed  Clinical Data: No additional findings.  ROS:  All other systems negative, except as noted in the  HPI. Review of Systems  Objective: Vital Signs: There were no vitals taken for this visit.  Specialty Comments:  No specialty comments available.  PMFS History: Patient Active Problem List   Diagnosis Date Noted   Dehiscence of amputation stump of left lower extremity (HCC) 10/11/2024   Dehiscence of amputation stump of right lower extremity (HCC) 10/11/2024   Surgical wound breakdown 10/11/2024   Osteomyelitis of great toe of right foot (HCC) 09/25/2024   Acute osteomyelitis of left calcaneus (HCC) 09/25/2024   Wound, open with complication 09/25/2024   Diabetes mellitus (HCC) 10/28/2022   Nuclear sclerotic cataract of both eyes 05/05/2022   Personal history of diabetic foot ulcer 08/10/2019   Bilateral carpal tunnel syndrome 02/25/2019   Major depressive disorder, recurrent episode, moderate (HCC) 02/09/2018   Diabetic peripheral neuropathy (HCC) 11/07/2017   Vitamin B12 deficiency 11/07/2017   DDD (degenerative disc disease), lumbosacral 10/02/2017   Overweight (BMI 25.0-29.9) 10/24/2016    Somnolence, daytime 03/10/2015   Generalized anxiety disorder 06/21/2013   Attention deficit disorder 06/21/2013   Hyperlipidemia associated with type 2 diabetes mellitus (HCC) 06/21/2013   Restless legs syndrome (RLS) 06/20/2013   Mild nonproliferative diabetic retinopathy of both eyes (HCC) 06/20/2013   Past Medical History:  Diagnosis Date   Anxiety    Attention deficit disorder (ADD)    Chronic foot pain 09/17/2024   bilateral - hx chronic foot ulcers   Depression    Diabetes (HCC)    Hiatal hernia    HLD (hyperlipidemia)    no meds, diet controlled per pt on 1014/25   Movement disorder    Neuropathy of both feet 08/21/2024   in [place]   Osteomyelitis (HCC) 09/17/2024   left calcaneus and great toe of right foot   Restless legs syndrome (RLS) 08/21/2024   in [place]   Schatzki's ring    Sleep apnea    does not use CPAP - intolerance to mask    Family History  Problem Relation Age of Onset   Stroke Mother    Sleep apnea Mother    Diabetes Father    Heart disease Father        pacemaker   Sleep apnea Father    Diabetes Other    Heart Problems Other    Colon cancer Neg Hx    Breast cancer Neg Hx     Past Surgical History:  Procedure Laterality Date   ABDOMINAL HYSTERECTOMY     AMPUTATION Right 10/11/2024   Procedure: AMPUTATION, FOOT, RAY;  Surgeon: Harden Jerona GAILS, MD;  Location: [facility] OR;  Service: Orthopedics;  Laterality: Right;  Right 1st Ray Amputation   AMPUTATION TOE Right 09/25/2024   Procedure: AMPUTATION, TOE;  Surgeon: Harden Jerona GAILS, MD;  Location: [facility] OR;  Service: Orthopedics;  Laterality: Right;  RIGHT GREAT TOE AMPUTATION   APPLICATION OF WOUND VAC N/A 09/25/2024   Procedure: APPLICATION, WOUND VAC;  Surgeon: Harden Jerona GAILS, MD;  Location: [facility] OR;  Service: Orthopedics;  Laterality: N/A;   APPLICATION OF WOUND VAC Left 10/11/2024   Procedure: APPLICATION, WOUND VAC;  Surgeon: Harden Jerona GAILS, MD;  Location: [facility] OR;  Service: Orthopedics;  Laterality: Left;    BREAST LUMPECTOMY WITH RADIOACTIVE SEED LOCALIZATION Right 04/25/2016   Procedure: RIGHT BREAST LUMPECTOMY WITH RADIOACTIVE SEED LOCALIZATION;  Surgeon: Deward Null III, MD;  Location:  SURGERY CENTER;  Service: General;  Laterality: Right;   CATARACT EXTRACTION, BILATERAL Bilateral    CHOLECYSTECTOMY     COLONOSCOPY  2013  FOOT SURGERY Left 05/2024   at [facility]   METATARSAL HEAD EXCISION Left 09/25/2024   Procedure: EXCISION, METATARSAL BONE, HEAD;  Surgeon: Harden Jerona GAILS, MD;  Location: [facility] OR;  Service: Orthopedics;  Laterality: Left;  LEFT PARTIAL CALCANEOUS EXCISION   METATARSAL HEAD EXCISION Left 10/11/2024   Procedure: EXCISION, LEFT CALCANEOUS BONE;  Surgeon: Harden Jerona GAILS, MD;  Location: [facility] OR;  Service: Orthopedics;  Laterality: Left;  LEFT PARTIAL CALCANEUS EXCISION   RECTOCELE REPAIR     SPINAL CORD STIMULATOR IMPLANT     not currently working per pt on 09/24/24   TOE AMPUTATION Right    2nd toe rt foot   TUBAL LIGATION     UPPER GI ENDOSCOPY  2016   Social History   Occupational History   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       "

## 2025-01-10 ENCOUNTER — Encounter: Payer: Self-pay | Admitting: Physician Assistant

## 2025-01-11 LAB — DRUG TOX MONITOR 1 W/CONF, ORAL FLD
Amphetamines: NEGATIVE ng/mL
Barbiturates: NEGATIVE ng/mL
Benzodiazepines: NEGATIVE ng/mL
Buprenorphine: NEGATIVE ng/mL
Cocaine: NEGATIVE ng/mL
Fentanyl: NEGATIVE ng/mL
Heroin Metabolite: NEGATIVE ng/mL
MARIJUANA: NEGATIVE ng/mL
MDMA: NEGATIVE ng/mL
Meprobamate: NEGATIVE ng/mL
Methadone: NEGATIVE ng/mL
Nicotine Metabolite: NEGATIVE ng/mL
Opiates: NEGATIVE ng/mL
Phencyclidine: NEGATIVE ng/mL
Tapentadol: NEGATIVE ng/mL
Tramadol: NEGATIVE ng/mL
Zolpidem: NEGATIVE ng/mL

## 2025-01-11 LAB — DRUG TOX ALC METAB W/CON, ORAL FLD: Alcohol Metabolite: NEGATIVE ng/mL

## 2025-01-21 ENCOUNTER — Encounter: Attending: Psychology | Admitting: Psychology

## 2025-01-30 ENCOUNTER — Ambulatory Visit: Admitting: Physician Assistant

## 2025-02-18 ENCOUNTER — Other Ambulatory Visit

## 2025-02-19 ENCOUNTER — Ambulatory Visit: Admitting: Psychology

## 2025-02-26 ENCOUNTER — Ambulatory Visit: Admitting: Family Medicine

## 2025-03-20 ENCOUNTER — Encounter: Admitting: Psychology

## 2025-04-08 ENCOUNTER — Encounter: Admitting: Registered Nurse
